# Patient Record
Sex: Female | Born: 1940 | Race: White | Hispanic: No | Smoking: Former smoker
Health system: Southern US, Community
[De-identification: ages and names within clinical notes are randomized; demographics above are authoritative.]

## PROBLEM LIST (undated history)

## (undated) DIAGNOSIS — M1990 Unspecified osteoarthritis, unspecified site: Secondary | ICD-10-CM

## (undated) DIAGNOSIS — K5792 Diverticulitis of intestine, part unspecified, without perforation or abscess without bleeding: Secondary | ICD-10-CM

## (undated) DIAGNOSIS — R32 Unspecified urinary incontinence: Secondary | ICD-10-CM

## (undated) DIAGNOSIS — E785 Hyperlipidemia, unspecified: Secondary | ICD-10-CM

## (undated) DIAGNOSIS — D472 Monoclonal gammopathy: Secondary | ICD-10-CM

## (undated) DIAGNOSIS — M8580 Other specified disorders of bone density and structure, unspecified site: Secondary | ICD-10-CM

## (undated) DIAGNOSIS — J45909 Unspecified asthma, uncomplicated: Secondary | ICD-10-CM

## (undated) DIAGNOSIS — I340 Nonrheumatic mitral (valve) insufficiency: Secondary | ICD-10-CM

## (undated) DIAGNOSIS — J449 Chronic obstructive pulmonary disease, unspecified: Secondary | ICD-10-CM

## (undated) DIAGNOSIS — I071 Rheumatic tricuspid insufficiency: Secondary | ICD-10-CM

## (undated) DIAGNOSIS — M109 Gout, unspecified: Secondary | ICD-10-CM

## (undated) DIAGNOSIS — J8410 Pulmonary fibrosis, unspecified: Secondary | ICD-10-CM

## (undated) DIAGNOSIS — I4891 Unspecified atrial fibrillation: Secondary | ICD-10-CM

## (undated) DIAGNOSIS — E039 Hypothyroidism, unspecified: Secondary | ICD-10-CM

## (undated) DIAGNOSIS — R3129 Other microscopic hematuria: Secondary | ICD-10-CM

## (undated) DIAGNOSIS — I10 Essential (primary) hypertension: Secondary | ICD-10-CM

## (undated) HISTORY — PX: ANKLE SURGERY: SHX546

## (undated) HISTORY — PX: APPENDECTOMY: SHX54

## (undated) HISTORY — DX: Monoclonal gammopathy: D47.2

## (undated) HISTORY — PX: ABDOMINAL HYSTERECTOMY: SHX81

## (undated) HISTORY — DX: Unspecified atrial fibrillation: I48.91

## (undated) HISTORY — PX: CHOLECYSTECTOMY: SHX55

## (undated) HISTORY — PX: OTHER SURGICAL HISTORY: SHX169

## (undated) HISTORY — PX: JOINT REPLACEMENT: SHX530

## (undated) HISTORY — PX: FRACTURE SURGERY: SHX138

---

## 2004-06-24 ENCOUNTER — Ambulatory Visit: Payer: Self-pay | Admitting: Family Medicine

## 2004-07-02 ENCOUNTER — Ambulatory Visit: Payer: Self-pay

## 2005-07-01 ENCOUNTER — Ambulatory Visit: Payer: Self-pay | Admitting: Unknown Physician Specialty

## 2005-07-23 ENCOUNTER — Ambulatory Visit: Payer: Self-pay | Admitting: Internal Medicine

## 2006-02-11 ENCOUNTER — Ambulatory Visit: Payer: Self-pay | Admitting: Gastroenterology

## 2006-07-13 ENCOUNTER — Ambulatory Visit: Payer: Self-pay | Admitting: Unknown Physician Specialty

## 2007-01-11 ENCOUNTER — Ambulatory Visit: Payer: Self-pay | Admitting: Unknown Physician Specialty

## 2007-03-29 ENCOUNTER — Ambulatory Visit: Payer: Self-pay | Admitting: Unknown Physician Specialty

## 2007-08-09 ENCOUNTER — Ambulatory Visit: Payer: Self-pay | Admitting: Unknown Physician Specialty

## 2008-01-20 ENCOUNTER — Ambulatory Visit: Payer: Self-pay | Admitting: Unknown Physician Specialty

## 2008-02-21 ENCOUNTER — Ambulatory Visit: Payer: Self-pay | Admitting: Internal Medicine

## 2008-03-15 ENCOUNTER — Ambulatory Visit: Payer: Self-pay | Admitting: Internal Medicine

## 2008-03-23 ENCOUNTER — Ambulatory Visit: Payer: Self-pay | Admitting: Internal Medicine

## 2008-04-02 ENCOUNTER — Emergency Department: Payer: Self-pay | Admitting: Emergency Medicine

## 2008-04-15 ENCOUNTER — Ambulatory Visit: Payer: Self-pay | Admitting: Internal Medicine

## 2008-09-11 ENCOUNTER — Ambulatory Visit: Payer: Self-pay | Admitting: Internal Medicine

## 2008-09-13 ENCOUNTER — Ambulatory Visit: Payer: Self-pay | Admitting: Internal Medicine

## 2008-10-01 ENCOUNTER — Ambulatory Visit: Payer: Self-pay | Admitting: Internal Medicine

## 2008-10-13 ENCOUNTER — Ambulatory Visit: Payer: Self-pay | Admitting: Internal Medicine

## 2009-05-28 ENCOUNTER — Ambulatory Visit: Payer: Self-pay | Admitting: Gastroenterology

## 2009-07-16 ENCOUNTER — Ambulatory Visit: Payer: Self-pay | Admitting: Gastroenterology

## 2009-09-13 ENCOUNTER — Ambulatory Visit: Payer: Self-pay | Admitting: Internal Medicine

## 2009-10-01 ENCOUNTER — Ambulatory Visit: Payer: Self-pay | Admitting: Internal Medicine

## 2009-10-03 ENCOUNTER — Ambulatory Visit: Payer: Self-pay | Admitting: Internal Medicine

## 2009-10-13 ENCOUNTER — Ambulatory Visit: Payer: Self-pay | Admitting: Internal Medicine

## 2009-10-30 ENCOUNTER — Ambulatory Visit: Payer: Self-pay | Admitting: Internal Medicine

## 2009-11-13 ENCOUNTER — Ambulatory Visit: Payer: Self-pay | Admitting: Internal Medicine

## 2009-11-13 ENCOUNTER — Ambulatory Visit: Payer: Self-pay | Admitting: Gastroenterology

## 2010-01-24 ENCOUNTER — Ambulatory Visit: Payer: Self-pay | Admitting: Internal Medicine

## 2010-02-13 ENCOUNTER — Ambulatory Visit: Payer: Self-pay | Admitting: Internal Medicine

## 2010-04-25 ENCOUNTER — Ambulatory Visit: Payer: Self-pay | Admitting: Internal Medicine

## 2010-05-15 ENCOUNTER — Ambulatory Visit: Payer: Self-pay | Admitting: Internal Medicine

## 2010-07-28 ENCOUNTER — Ambulatory Visit: Payer: Self-pay | Admitting: Internal Medicine

## 2010-08-01 ENCOUNTER — Ambulatory Visit: Payer: Self-pay | Admitting: Internal Medicine

## 2010-08-14 ENCOUNTER — Ambulatory Visit: Payer: Self-pay | Admitting: Internal Medicine

## 2010-12-31 ENCOUNTER — Ambulatory Visit: Payer: Self-pay

## 2011-03-06 ENCOUNTER — Ambulatory Visit: Payer: Self-pay | Admitting: Gastroenterology

## 2011-05-06 ENCOUNTER — Ambulatory Visit: Payer: Self-pay | Admitting: Internal Medicine

## 2011-08-05 ENCOUNTER — Ambulatory Visit: Payer: Self-pay | Admitting: Internal Medicine

## 2011-08-05 LAB — CBC CANCER CENTER
Basophil %: 0.7 %
Eosinophil #: 0.1 x10 3/mm (ref 0.0–0.7)
Eosinophil %: 2 %
Lymphocyte #: 0.9 x10 3/mm — ABNORMAL LOW (ref 1.0–3.6)
Lymphocyte %: 32.1 %
MCH: 34 pg (ref 26.0–34.0)
MCHC: 35.6 g/dL (ref 32.0–36.0)
MCV: 96 fL (ref 80–100)
Monocyte %: 11 %
Neutrophil #: 1.6 x10 3/mm (ref 1.4–6.5)
Platelet: 119 x10 3/mm — ABNORMAL LOW (ref 150–440)
RBC: 4.32 10*6/uL (ref 3.80–5.20)
RDW: 12.7 % (ref 11.5–14.5)

## 2011-08-05 LAB — CREATININE, SERUM
EGFR (African American): >60
EGFR (Non-African Amer.): >60

## 2011-08-05 LAB — HEPATIC FUNCTION PANEL A (ARMC)
Albumin: 3.6 g/dL (ref 3.4–5.0)
Alkaline Phosphatase: 141 U/L — ABNORMAL HIGH (ref 50–136)
Bilirubin, Direct: 0.2 mg/dL (ref 0.00–0.20)
SGPT (ALT): 90 U/L — ABNORMAL HIGH
Total Protein: 8.3 g/dL — ABNORMAL HIGH (ref 6.4–8.2)

## 2011-08-05 LAB — CALCIUM: Calcium, Total: 9.4 mg/dL (ref 8.5–10.1)

## 2011-08-14 ENCOUNTER — Ambulatory Visit: Payer: Self-pay | Admitting: Internal Medicine

## 2011-09-22 ENCOUNTER — Ambulatory Visit: Payer: Self-pay | Admitting: Gastroenterology

## 2012-01-27 ENCOUNTER — Ambulatory Visit: Payer: Self-pay

## 2012-02-02 ENCOUNTER — Ambulatory Visit: Payer: Self-pay | Admitting: Internal Medicine

## 2012-02-02 LAB — CBC CANCER CENTER
Basophil #: 0 x10 3/mm (ref 0.0–0.1)
Eosinophil #: 0.1 x10 3/mm (ref 0.0–0.7)
Eosinophil %: 3.3 %
HGB: 13 g/dL (ref 12.0–16.0)
Lymphocyte #: 1.1 x10 3/mm (ref 1.0–3.6)
MCH: 32.9 pg (ref 26.0–34.0)
MCHC: 33.8 g/dL (ref 32.0–36.0)
Monocyte #: 0.4 x10 3/mm (ref 0.2–0.9)
Neutrophil %: 53 %
Platelet: 132 x10 3/mm — ABNORMAL LOW (ref 150–440)
RBC: 3.96 10*6/uL (ref 3.80–5.20)
RDW: 12.8 % (ref 11.5–14.5)

## 2012-02-02 LAB — FERRITIN: Ferritin (ARMC): 185 ng/mL (ref 8–388)

## 2012-02-02 LAB — CREATININE, SERUM
Creatinine: 1.38 mg/dL — ABNORMAL HIGH (ref 0.60–1.30)
EGFR (African American): 44 — ABNORMAL LOW
EGFR (Non-African Amer.): 38 — ABNORMAL LOW

## 2012-02-02 LAB — IRON AND TIBC
Iron Bind.Cap.(Total): 345 ug/dL (ref 250–450)
Iron Saturation: 29 %

## 2012-02-03 LAB — PROT IMMUNOELECTROPHORES(ARMC)

## 2012-02-14 ENCOUNTER — Ambulatory Visit: Payer: Self-pay | Admitting: Internal Medicine

## 2012-03-29 ENCOUNTER — Ambulatory Visit: Payer: Self-pay

## 2012-04-11 ENCOUNTER — Ambulatory Visit: Payer: Self-pay | Admitting: Internal Medicine

## 2012-04-11 LAB — CBC CANCER CENTER
Basophil %: 0.9 %
Eosinophil %: 2.5 %
HCT: 40.4 % (ref 35.0–47.0)
HGB: 13.7 g/dL (ref 12.0–16.0)
Lymphocyte #: 1.4 x10 3/mm (ref 1.0–3.6)
Lymphocyte %: 38.3 %
Monocyte #: 0.4 x10 3/mm (ref 0.2–0.9)
Monocyte %: 11.5 %
Neutrophil #: 1.7 x10 3/mm (ref 1.4–6.5)
Neutrophil %: 46.8 %
RBC: 4.16 10*6/uL (ref 3.80–5.20)

## 2012-04-15 ENCOUNTER — Ambulatory Visit: Payer: Self-pay | Admitting: Internal Medicine

## 2012-04-19 ENCOUNTER — Ambulatory Visit: Payer: Self-pay | Admitting: Ophthalmology

## 2012-05-24 ENCOUNTER — Ambulatory Visit: Payer: Self-pay | Admitting: Ophthalmology

## 2012-07-16 ENCOUNTER — Ambulatory Visit: Payer: Self-pay | Admitting: Internal Medicine

## 2012-08-05 LAB — CREATININE, SERUM
EGFR (African American): >60
EGFR (Non-African Amer.): >60

## 2012-08-05 LAB — CBC CANCER CENTER
Basophil #: 0 x10 3/mm (ref 0.0–0.1)
Basophil %: 1 %
Eosinophil %: 3.5 %
HGB: 14 g/dL (ref 12.0–16.0)
Lymphocyte %: 36 %
Neutrophil #: 1.5 x10 3/mm (ref 1.4–6.5)
Neutrophil %: 47.9 %
RDW: 12.4 % (ref 11.5–14.5)
WBC: 3.2 x10 3/mm — ABNORMAL LOW (ref 3.6–11.0)

## 2012-08-05 LAB — CALCIUM: Calcium, Total: 9.1 mg/dL (ref 8.5–10.1)

## 2012-08-05 LAB — RETICULOCYTES: Reticulocyte: 1.16 % (ref 0.5–2.2)

## 2012-08-13 ENCOUNTER — Ambulatory Visit: Payer: Self-pay | Admitting: Internal Medicine

## 2013-01-27 ENCOUNTER — Ambulatory Visit: Payer: Self-pay | Admitting: Internal Medicine

## 2013-02-02 LAB — IRON AND TIBC
Iron Saturation: 34 %
Iron: 114 ug/dL (ref 50–170)
Unbound Iron-Bind.Cap.: 220 ug/dL

## 2013-02-02 LAB — BASIC METABOLIC PANEL
Anion Gap: 4 — ABNORMAL LOW (ref 7–16)
BUN: 15 mg/dL (ref 7–18)
Chloride: 102 mmol/L (ref 98–107)
Co2: 29 mmol/L (ref 21–32)
Creatinine: 0.8 mg/dL (ref 0.60–1.30)
EGFR (African American): >60
EGFR (Non-African Amer.): >60
Glucose: 111 mg/dL — ABNORMAL HIGH (ref 65–99)
Potassium: 3.8 mmol/L (ref 3.5–5.1)
Sodium: 135 mmol/L — ABNORMAL LOW (ref 136–145)

## 2013-02-02 LAB — FERRITIN: Ferritin (ARMC): 109 ng/mL (ref 8–388)

## 2013-02-07 ENCOUNTER — Ambulatory Visit: Payer: Self-pay

## 2013-02-13 ENCOUNTER — Ambulatory Visit: Payer: Self-pay | Admitting: Internal Medicine

## 2013-02-16 LAB — CBC CANCER CENTER
Basophil #: 0 x10 3/mm (ref 0.0–0.1)
Basophil %: 0.8 %
Eosinophil %: 3 %
HGB: 13.7 g/dL (ref 12.0–16.0)
Lymphocyte %: 33 %
MCH: 32.9 pg (ref 26.0–34.0)
MCHC: 35 g/dL (ref 32.0–36.0)
MCV: 94 fL (ref 80–100)
Monocyte #: 0.4 x10 3/mm (ref 0.2–0.9)
Monocyte %: 11.9 %
Neutrophil #: 1.6 x10 3/mm (ref 1.4–6.5)
Neutrophil %: 51.3 %
Platelet: 119 x10 3/mm — ABNORMAL LOW (ref 150–440)
RDW: 12.5 % (ref 11.5–14.5)

## 2013-03-15 ENCOUNTER — Ambulatory Visit: Payer: Self-pay | Admitting: Internal Medicine

## 2013-03-30 ENCOUNTER — Ambulatory Visit: Payer: Self-pay

## 2013-08-03 ENCOUNTER — Ambulatory Visit: Payer: Self-pay | Admitting: Internal Medicine

## 2013-08-17 ENCOUNTER — Ambulatory Visit: Payer: Self-pay | Admitting: Internal Medicine

## 2013-08-18 LAB — CBC CANCER CENTER
BASOS PCT: 0.9 %
Basophil #: 0 x10 3/mm (ref 0.0–0.1)
EOS ABS: 0.1 x10 3/mm (ref 0.0–0.7)
EOS PCT: 2.6 %
HCT: 39.9 % (ref 35.0–47.0)
HGB: 13.6 g/dL (ref 12.0–16.0)
LYMPHS ABS: 1 x10 3/mm (ref 1.0–3.6)
Lymphocyte %: 31.9 %
MCH: 31.9 pg (ref 26.0–34.0)
MCHC: 34.2 g/dL (ref 32.0–36.0)
MCV: 93 fL (ref 80–100)
MONO ABS: 0.3 x10 3/mm (ref 0.2–0.9)
MONOS PCT: 10.4 %
Neutrophil #: 1.7 x10 3/mm (ref 1.4–6.5)
Neutrophil %: 54.2 %
Platelet: 111 x10 3/mm — ABNORMAL LOW (ref 150–440)
RBC: 4.28 10*6/uL (ref 3.80–5.20)
RDW: 12.6 % (ref 11.5–14.5)
WBC: 3.1 x10 3/mm — AB (ref 3.6–11.0)

## 2013-08-18 LAB — CALCIUM: CALCIUM: 9.5 mg/dL (ref 8.5–10.1)

## 2013-08-18 LAB — CREATININE, SERUM
CREATININE: 0.87 mg/dL (ref 0.60–1.30)
EGFR (African American): >60
EGFR (Non-African Amer.): >60

## 2013-08-18 LAB — RETICULOCYTES
ABSOLUTE RETIC COUNT: 0.0659 10*6/uL (ref 0.019–0.186)
RETICULOCYTE: 1.54 % (ref 0.4–3.1)

## 2013-08-21 LAB — PROT IMMUNOELECTROPHORES(ARMC)

## 2013-08-25 ENCOUNTER — Ambulatory Visit: Payer: Self-pay | Admitting: Gastroenterology

## 2013-09-13 ENCOUNTER — Ambulatory Visit: Payer: Self-pay | Admitting: Internal Medicine

## 2014-02-13 DIAGNOSIS — D472 Monoclonal gammopathy: Secondary | ICD-10-CM | POA: Insufficient documentation

## 2014-02-13 DIAGNOSIS — Z8739 Personal history of other diseases of the musculoskeletal system and connective tissue: Secondary | ICD-10-CM | POA: Insufficient documentation

## 2014-02-13 DIAGNOSIS — R945 Abnormal results of liver function studies: Secondary | ICD-10-CM | POA: Insufficient documentation

## 2014-02-13 DIAGNOSIS — M19042 Primary osteoarthritis, left hand: Secondary | ICD-10-CM

## 2014-02-13 DIAGNOSIS — R768 Other specified abnormal immunological findings in serum: Secondary | ICD-10-CM | POA: Insufficient documentation

## 2014-02-13 DIAGNOSIS — R7989 Other specified abnormal findings of blood chemistry: Secondary | ICD-10-CM | POA: Insufficient documentation

## 2014-02-13 DIAGNOSIS — M19041 Primary osteoarthritis, right hand: Secondary | ICD-10-CM | POA: Insufficient documentation

## 2014-02-20 ENCOUNTER — Ambulatory Visit: Payer: Self-pay | Admitting: Internal Medicine

## 2014-02-20 LAB — CREATININE, SERUM
Creatinine: 0.99 mg/dL (ref 0.60–1.30)
EGFR (African American): >60
EGFR (Non-African Amer.): 56 — ABNORMAL LOW

## 2014-02-20 LAB — CBC CANCER CENTER
Basophil #: 0 x10 3/mm (ref 0.0–0.1)
Basophil %: 1 %
Eosinophil #: 0.1 x10 3/mm (ref 0.0–0.7)
Eosinophil %: 3.2 %
HCT: 38.9 % (ref 35.0–47.0)
HGB: 13.4 g/dL (ref 12.0–16.0)
LYMPHS ABS: 1.1 x10 3/mm (ref 1.0–3.6)
LYMPHS PCT: 32.6 %
MCH: 32.3 pg (ref 26.0–34.0)
MCHC: 34.5 g/dL (ref 32.0–36.0)
MCV: 94 fL (ref 80–100)
Monocyte #: 0.4 x10 3/mm (ref 0.2–0.9)
Monocyte %: 12.1 %
NEUTROS PCT: 51.1 %
Neutrophil #: 1.8 x10 3/mm (ref 1.4–6.5)
PLATELETS: 120 x10 3/mm — AB (ref 150–440)
RBC: 4.14 10*6/uL (ref 3.80–5.20)
RDW: 12.5 % (ref 11.5–14.5)
WBC: 3.5 x10 3/mm — AB (ref 3.6–11.0)

## 2014-02-20 LAB — CALCIUM: Calcium, Total: 8.9 mg/dL (ref 8.5–10.1)

## 2014-02-20 LAB — FERRITIN: FERRITIN (ARMC): 96 ng/mL (ref 8–388)

## 2014-03-15 ENCOUNTER — Ambulatory Visit: Payer: Self-pay | Admitting: Internal Medicine

## 2014-08-20 ENCOUNTER — Ambulatory Visit
Admit: 2014-08-20 | Disposition: A | Discharge status: Home / Self Care | Payer: Self-pay | Attending: Internal Medicine | Admitting: Internal Medicine

## 2014-08-20 DIAGNOSIS — Z79899 Other long term (current) drug therapy: Secondary | ICD-10-CM | POA: Diagnosis not present

## 2014-08-20 DIAGNOSIS — D72819 Decreased white blood cell count, unspecified: Secondary | ICD-10-CM | POA: Diagnosis not present

## 2014-08-20 DIAGNOSIS — E039 Hypothyroidism, unspecified: Secondary | ICD-10-CM | POA: Diagnosis not present

## 2014-08-20 DIAGNOSIS — I10 Essential (primary) hypertension: Secondary | ICD-10-CM | POA: Diagnosis not present

## 2014-08-20 DIAGNOSIS — Z7982 Long term (current) use of aspirin: Secondary | ICD-10-CM | POA: Diagnosis not present

## 2014-08-20 DIAGNOSIS — D472 Monoclonal gammopathy: Secondary | ICD-10-CM | POA: Diagnosis not present

## 2014-08-20 DIAGNOSIS — R161 Splenomegaly, not elsewhere classified: Secondary | ICD-10-CM | POA: Diagnosis not present

## 2014-08-20 DIAGNOSIS — D696 Thrombocytopenia, unspecified: Secondary | ICD-10-CM | POA: Diagnosis not present

## 2014-08-20 DIAGNOSIS — K529 Noninfective gastroenteritis and colitis, unspecified: Secondary | ICD-10-CM | POA: Diagnosis not present

## 2014-09-04 DIAGNOSIS — E039 Hypothyroidism, unspecified: Secondary | ICD-10-CM | POA: Diagnosis not present

## 2014-09-04 DIAGNOSIS — H8149 Vertigo of central origin, unspecified ear: Secondary | ICD-10-CM | POA: Diagnosis not present

## 2014-09-04 DIAGNOSIS — D72819 Decreased white blood cell count, unspecified: Secondary | ICD-10-CM | POA: Diagnosis not present

## 2014-09-04 DIAGNOSIS — I10 Essential (primary) hypertension: Secondary | ICD-10-CM | POA: Diagnosis not present

## 2014-09-04 DIAGNOSIS — M79672 Pain in left foot: Secondary | ICD-10-CM | POA: Diagnosis not present

## 2014-09-04 DIAGNOSIS — M064 Inflammatory polyarthropathy: Secondary | ICD-10-CM | POA: Diagnosis not present

## 2014-09-04 DIAGNOSIS — K7460 Unspecified cirrhosis of liver: Secondary | ICD-10-CM | POA: Diagnosis not present

## 2014-09-14 ENCOUNTER — Ambulatory Visit
Admit: 2014-09-14 | Disposition: A | Discharge status: Home / Self Care | Payer: Self-pay | Attending: Internal Medicine | Admitting: Internal Medicine

## 2014-09-19 ENCOUNTER — Ambulatory Visit
Admit: 2014-09-19 | Disposition: A | Discharge status: Home / Self Care | Payer: Self-pay | Attending: Nurse Practitioner | Admitting: Nurse Practitioner

## 2014-09-19 DIAGNOSIS — M7989 Other specified soft tissue disorders: Secondary | ICD-10-CM | POA: Diagnosis not present

## 2014-09-19 DIAGNOSIS — M79672 Pain in left foot: Secondary | ICD-10-CM | POA: Diagnosis not present

## 2014-09-26 DIAGNOSIS — M3501 Sicca syndrome with keratoconjunctivitis: Secondary | ICD-10-CM | POA: Diagnosis not present

## 2014-11-01 ENCOUNTER — Other Ambulatory Visit: Payer: Self-pay | Admitting: Gastroenterology

## 2014-11-01 DIAGNOSIS — Z86010 Personal history of colonic polyps: Secondary | ICD-10-CM | POA: Diagnosis not present

## 2014-11-01 DIAGNOSIS — K760 Fatty (change of) liver, not elsewhere classified: Secondary | ICD-10-CM

## 2014-11-06 ENCOUNTER — Ambulatory Visit: Payer: Self-pay

## 2014-11-09 ENCOUNTER — Ambulatory Visit
Admission: RE | Admit: 2014-11-09 | Discharge: 2014-11-09 | Disposition: A | Discharge status: Home / Self Care | Payer: Commercial Managed Care - HMO | Source: Ambulatory Visit | Attending: Gastroenterology | Admitting: Gastroenterology

## 2014-11-09 DIAGNOSIS — K760 Fatty (change of) liver, not elsewhere classified: Secondary | ICD-10-CM | POA: Diagnosis not present

## 2014-11-09 DIAGNOSIS — Z9049 Acquired absence of other specified parts of digestive tract: Secondary | ICD-10-CM | POA: Diagnosis not present

## 2014-12-03 ENCOUNTER — Encounter: Payer: Self-pay | Admitting: *Deleted

## 2014-12-04 ENCOUNTER — Encounter: Payer: Self-pay | Admitting: *Deleted

## 2014-12-04 ENCOUNTER — Encounter
Admission: RE | Disposition: A | Discharge status: Home / Self Care | Payer: Self-pay | Source: Ambulatory Visit | Attending: Gastroenterology

## 2014-12-04 ENCOUNTER — Ambulatory Visit: Payer: Commercial Managed Care - HMO | Admitting: Anesthesiology

## 2014-12-04 ENCOUNTER — Ambulatory Visit
Admission: RE | Admit: 2014-12-04 | Discharge: 2014-12-04 | Disposition: A | Discharge status: Home / Self Care | Payer: Commercial Managed Care - HMO | Source: Ambulatory Visit | Attending: Gastroenterology | Admitting: Gastroenterology

## 2014-12-04 DIAGNOSIS — D123 Benign neoplasm of transverse colon: Secondary | ICD-10-CM | POA: Insufficient documentation

## 2014-12-04 DIAGNOSIS — Z885 Allergy status to narcotic agent status: Secondary | ICD-10-CM | POA: Diagnosis not present

## 2014-12-04 DIAGNOSIS — M8580 Other specified disorders of bone density and structure, unspecified site: Secondary | ICD-10-CM | POA: Insufficient documentation

## 2014-12-04 DIAGNOSIS — D175 Benign lipomatous neoplasm of intra-abdominal organs: Secondary | ICD-10-CM | POA: Diagnosis not present

## 2014-12-04 DIAGNOSIS — J449 Chronic obstructive pulmonary disease, unspecified: Secondary | ICD-10-CM | POA: Diagnosis not present

## 2014-12-04 DIAGNOSIS — K5730 Diverticulosis of large intestine without perforation or abscess without bleeding: Secondary | ICD-10-CM | POA: Insufficient documentation

## 2014-12-04 DIAGNOSIS — I340 Nonrheumatic mitral (valve) insufficiency: Secondary | ICD-10-CM | POA: Diagnosis not present

## 2014-12-04 DIAGNOSIS — R319 Hematuria, unspecified: Secondary | ICD-10-CM | POA: Insufficient documentation

## 2014-12-04 DIAGNOSIS — I358 Other nonrheumatic aortic valve disorders: Secondary | ICD-10-CM | POA: Insufficient documentation

## 2014-12-04 DIAGNOSIS — R32 Unspecified urinary incontinence: Secondary | ICD-10-CM | POA: Diagnosis not present

## 2014-12-04 DIAGNOSIS — E039 Hypothyroidism, unspecified: Secondary | ICD-10-CM | POA: Insufficient documentation

## 2014-12-04 DIAGNOSIS — Z86010 Personal history of colonic polyps: Secondary | ICD-10-CM | POA: Insufficient documentation

## 2014-12-04 DIAGNOSIS — Z79899 Other long term (current) drug therapy: Secondary | ICD-10-CM | POA: Insufficient documentation

## 2014-12-04 DIAGNOSIS — D124 Benign neoplasm of descending colon: Secondary | ICD-10-CM | POA: Insufficient documentation

## 2014-12-04 DIAGNOSIS — E785 Hyperlipidemia, unspecified: Secondary | ICD-10-CM | POA: Diagnosis not present

## 2014-12-04 DIAGNOSIS — J8410 Pulmonary fibrosis, unspecified: Secondary | ICD-10-CM | POA: Diagnosis not present

## 2014-12-04 DIAGNOSIS — K219 Gastro-esophageal reflux disease without esophagitis: Secondary | ICD-10-CM | POA: Diagnosis not present

## 2014-12-04 DIAGNOSIS — I10 Essential (primary) hypertension: Secondary | ICD-10-CM | POA: Diagnosis not present

## 2014-12-04 DIAGNOSIS — M1990 Unspecified osteoarthritis, unspecified site: Secondary | ICD-10-CM | POA: Diagnosis not present

## 2014-12-04 DIAGNOSIS — Z888 Allergy status to other drugs, medicaments and biological substances status: Secondary | ICD-10-CM | POA: Diagnosis not present

## 2014-12-04 DIAGNOSIS — M109 Gout, unspecified: Secondary | ICD-10-CM | POA: Insufficient documentation

## 2014-12-04 DIAGNOSIS — D179 Benign lipomatous neoplasm, unspecified: Secondary | ICD-10-CM | POA: Diagnosis not present

## 2014-12-04 DIAGNOSIS — R7989 Other specified abnormal findings of blood chemistry: Secondary | ICD-10-CM | POA: Diagnosis not present

## 2014-12-04 DIAGNOSIS — Z7982 Long term (current) use of aspirin: Secondary | ICD-10-CM | POA: Diagnosis not present

## 2014-12-04 HISTORY — DX: Hypothyroidism, unspecified: E03.9

## 2014-12-04 HISTORY — DX: Hyperlipidemia, unspecified: E78.5

## 2014-12-04 HISTORY — DX: Gout, unspecified: M10.9

## 2014-12-04 HISTORY — DX: Chronic obstructive pulmonary disease, unspecified: J44.9

## 2014-12-04 HISTORY — DX: Rheumatic tricuspid insufficiency: I07.1

## 2014-12-04 HISTORY — DX: Unspecified osteoarthritis, unspecified site: M19.90

## 2014-12-04 HISTORY — DX: Essential (primary) hypertension: I10

## 2014-12-04 HISTORY — DX: Other microscopic hematuria: R31.29

## 2014-12-04 HISTORY — DX: Diverticulitis of intestine, part unspecified, without perforation or abscess without bleeding: K57.92

## 2014-12-04 HISTORY — DX: Nonrheumatic mitral (valve) insufficiency: I34.0

## 2014-12-04 HISTORY — DX: Other specified disorders of bone density and structure, unspecified site: M85.80

## 2014-12-04 HISTORY — DX: Pulmonary fibrosis, unspecified: J84.10

## 2014-12-04 HISTORY — PX: COLONOSCOPY WITH PROPOFOL: SHX5780

## 2014-12-04 HISTORY — DX: Unspecified urinary incontinence: R32

## 2014-12-04 LAB — PLATELET COUNT: Platelets: 128 10*3/uL — ABNORMAL LOW (ref 150–440)

## 2014-12-04 LAB — PROTIME-INR
INR: 1.06
Prothrombin Time: 14 seconds (ref 11.4–15.0)

## 2014-12-04 SURGERY — COLONOSCOPY WITH PROPOFOL
Anesthesia: General

## 2014-12-04 MED ORDER — MIDAZOLAM HCL 2 MG/2ML IJ SOLN
INTRAMUSCULAR | Status: DC | PRN
  Administered 2014-12-04: 1 mg via INTRAVENOUS

## 2014-12-04 MED ORDER — FENTANYL CITRATE (PF) 100 MCG/2ML IJ SOLN
INTRAMUSCULAR | Status: DC | PRN
  Administered 2014-12-04: 50 ug via INTRAVENOUS

## 2014-12-04 MED ORDER — SODIUM CHLORIDE 0.9 % IV SOLN
INTRAVENOUS | Status: DC
  Administered 2014-12-04: 08:00:00 via INTRAVENOUS

## 2014-12-04 MED ORDER — SODIUM CHLORIDE 0.9 % IV SOLN: INTRAVENOUS | Status: DC

## 2014-12-04 NOTE — Anesthesia Preprocedure Evaluation (Signed)
Anesthesia Evaluation  Patient identified by MRN, date of birth, ID band Patient awake    Reviewed: Allergy & Precautions, NPO status , Patient's Chart, lab work & pertinent test results  History of Anesthesia Complications Negative for: history of anesthetic complications  Airway Mallampati: II  TM Distance: >3 FB Neck ROM: Full    Dental  (+) Upper Dentures, Lower Dentures   Pulmonary COPD breath sounds clear to auscultation  Pulmonary exam normal       Cardiovascular Exercise Tolerance: Poor hypertension, Pt. on medications Normal cardiovascular examRhythm:Regular Rate:Normal     Neuro/Psych negative neurological ROS  negative psych ROS   GI/Hepatic Neg liver ROS, GERD-  Medicated and Controlled,Increased LFTs   Endo/Other  Hypothyroidism   Renal/GU negative Renal ROS  negative genitourinary   Musculoskeletal  (+) Arthritis -,   Abdominal   Peds negative pediatric ROS (+)  Hematology negative hematology ROS (+)   Anesthesia Other Findings   Reproductive/Obstetrics negative OB ROS                             Anesthesia Physical Anesthesia Plan  ASA: III  Anesthesia Plan: General   Post-op Pain Management:    Induction: Intravenous  Airway Management Planned: Nasal Cannula  Additional Equipment:   Intra-op Plan:   Post-operative Plan:   Informed Consent: I have reviewed the patients History and Physical, chart, labs and discussed the procedure including the risks, benefits and alternatives for the proposed anesthesia with the patient or authorized representative who has indicated his/her understanding and acceptance.     Plan Discussed with: Surgeon and CRNA  Anesthesia Plan Comments:         Anesthesia Quick Evaluation

## 2014-12-04 NOTE — Op Note (Signed)
[facility] Gastroenterology Patient Name: Martha Peterson Procedure Date: 12/04/2014 9:00 AM MRN: [id number] Account #: [id number] Date of Birth: 08-Aug-1940 Admit Type: Outpatient Age: 74 Room: [facility] ENDO ROOM 3 Gender: Female Note Status: Finalized Procedure:         Colonoscopy Indications:       Personal history of colonic polyps Providers:         Lollie Sails, MD Referring MD:      [name], MD (Referring MD) Medicines:         Monitored Anesthesia Care Complications:     No immediate complications. Procedure:         Pre-Anesthesia Assessment:                    - ASA Grade Assessment: II - A patient with mild systemic                     disease.                    After obtaining informed consent, the colonoscope was                     passed under direct vision. Throughout the procedure, the                     patient's blood pressure, pulse, and oxygen saturations                     were monitored continuously. The Colonoscope was                     introduced through the anus and advanced to the the cecum,                     identified by appendiceal orifice and ileocecal valve. The                     colonoscopy was performed with moderate difficulty.                     Successful completion of the procedure was aided by                     changing the patient to a supine position and using manual                     pressure. Findings:      There was a large lipoma, 34 mm in diameter, in the proximal sigmoid       colon. Positive pillow sign      A 2 mm polyp was found in the transverse colon. The polyp was flat. The       polyp was removed with a cold biopsy forceps. Resection and retrieval       were complete.      A 2 mm polyp was found in the descending colon. The polyp was sessile.       The polyp was removed with a cold biopsy forceps. Resection and       retrieval were complete.      A few small-mouthed diverticula were  found in the sigmoid colon and in       the descending colon.      The digital rectal exam was normal. Impression:        -  Large lipoma in the proximal sigmoid colon.                    - One 2 mm polyp in the transverse colon. Resected and                     retrieved.                    - One 2 mm polyp in the descending colon. Resected and                     retrieved.                    - Diverticulosis in the sigmoid colon and in the                     descending colon. Recommendation:    - Await pathology results.                    - Telephone GI clinic for pathology results in 1 week.                    - Discharge patient to home. Procedure Code(s): --- Professional ---                    [phone], Colonoscopy, flexible; with biopsy, single or                     multiple Diagnosis Code(s): --- Professional ---                    214.3, Lipoma of intra-abdominal organs                    211.3, Benign neoplasm of colon                    V12.72, Personal history of colonic polyps                    562.10, Diverticulosis of colon (without mention of                     hemorrhage) CPT copyright 2014 American Medical Association. All rights reserved. The codes documented in this report are preliminary and upon coder review may  be revised to meet current compliance requirements. Lollie Sails, MD 12/04/2014 9:41:03 AM This report has been signed electronically. Number of Addenda: 0 Note Initiated On: 12/04/2014 9:00 AM Scope Withdrawal Time: 0 hours 9 minutes 36 seconds  Total Procedure Duration: 0 hours 25 minutes 42 seconds       [facility]

## 2014-12-04 NOTE — H&P (Signed)
Outpatient short stay form Pre-procedure 12/04/2014 8:55 AM Lollie Sails MD  Primary Physician: Dr. Clayborn Bigness  Reason for visit:  Colonoscopy  History of present illness:  Patient is a 74 year old Caucasian female presenting for a colonoscopy in regards to a personal history of adenomatous colon polyps. Her last colonoscopy was in 2013. Also has a history of fatty liver/steatohepatosis.  She tolerated her prep well she is not taking any aspirin products currently.    Current facility-administered medications:  .  0.9 %  sodium chloride infusion, , Intravenous, Continuous, Lollie Sails, MD .  0.9 %  sodium chloride infusion, , Intravenous, Continuous, Lollie Sails, MD, Last Rate: 20 mL/hr at 12/04/14 0825  Prescriptions prior to admission  Medication Sig Dispense Refill Last Dose  . ascorbic acid (VITAMIN C) 500 MG tablet Take 500 mg by mouth daily.   Past Week at Unknown time  . aspirin EC 81 MG tablet Take 81 mg by mouth daily.   Past Week at Unknown time  . cholecalciferol (VITAMIN D) 400 UNITS TABS tablet Take 1,000 Units by mouth.   Past Week at Unknown time  . cloNIDine (CATAPRES) 0.1 MG tablet Take 0.1 mg by mouth daily.   12/04/2014 at 0600  . etodolac (LODINE) 300 MG capsule Take 300 mg by mouth 2 (two) times daily.   12/03/2014 at Unknown time  . furosemide (LASIX) 40 MG tablet Take 40 mg by mouth 2 (two) times daily.   12/03/2014 at Unknown time  . levothyroxine (SYNTHROID, LEVOTHROID) 150 MCG tablet Take 300 mcg by mouth daily before breakfast.   12/04/2014 at 0600  . losartan-hydrochlorothiazide (HYZAAR) 100-25 MG per tablet Take 1 tablet by mouth daily.   12/04/2014 at 0600  . meclizine (ANTIVERT) 12.5 MG tablet Take 12.5 mg by mouth daily.   12/03/2014 at Unknown time  . omega-3 acid ethyl esters (LOVAZA) 1 G capsule Take 1 g by mouth 2 (two) times daily.   Past Week at Unknown time  . omeprazole (PRILOSEC) 20 MG capsule Take 20 mg by mouth daily.   12/03/2014 at  Unknown time  . oxycodone (OXY-IR) 5 MG capsule Take 5 mg by mouth every 4 (four) hours as needed.   12/03/2014 at Unknown time  . Potassium Gluconate 595 MG CAPS Take by mouth.   12/03/2014 at Unknown time  . tiZANidine (ZANAFLEX) 4 MG capsule Take 4 mg by mouth at bedtime.   12/03/2014 at Unknown time  . traMADol (ULTRAM) 50 MG tablet Take by mouth every 8 (eight) hours as needed.   12/03/2014 at Unknown time     Allergies  Allergen Reactions  . Codeine   . Levaquin [Levofloxacin]      Past Medical History  Diagnosis Date  . Hypertension   . Hyperlipidemia   . Hypothyroidism   . COPD (chronic obstructive pulmonary disease)   . Pulmonary fibrosis   . Urinary incontinence   . Mild tricuspid regurgitation   . Mild mitral regurgitation   . Osteopenia   . Microhematuria   . Diverticulitis   . Arthritis   . Gout     Review of systems:      Physical Exam    Heart and lungs: Regular rate and rhythm without rub or gallop lungs are bilaterally clear. There is a aortic murmur mild stenosis. Lungs are bilaterally clear    HEENT: Normocephalic atraumatic eyes are anicteric    Other:     Pertinant exam for procedure: Soft nontender nondistended  bowel sounds positive normoactive    Planned proceedures: Colonoscopy and indicated procedures I have discussed the risks benefits and complications of procedures to include not limited to bleeding, infection, perforation and the risk of sedation and the patient wishes to proceed. I have discussed the risks benefits and complications of procedures to include not limited to bleeding, infection, perforation and the risk of sedation and the patient wishes to proceed.    Lollie Sails, MD Gastroenterology 12/04/2014  8:55 AM

## 2014-12-04 NOTE — Anesthesia Postprocedure Evaluation (Signed)
  Anesthesia Post-op Note  Patient: Martha Peterson  Procedure(s) Performed: Procedure(s): COLONOSCOPY WITH PROPOFOL (N/A)  Anesthesia type:General  Patient location: PACU  Post pain: Pain level controlled  Post assessment: Post-op Vital signs reviewed, Patient's Cardiovascular Status Stable, Respiratory Function Stable, Patent Airway and No signs of Nausea or vomiting  Post vital signs: Reviewed and stable  Last Vitals:  Filed Vitals:   12/04/14 1010  BP: 139/95  Pulse: 62  Temp:   Resp: 16    Level of consciousness: awake, alert  and patient cooperative  Complications: No apparent anesthesia complications

## 2014-12-04 NOTE — Transfer of Care (Signed)
Immediate Anesthesia Transfer of Care Note  Patient: Martha Peterson  Procedure(s) Performed: Procedure(s): COLONOSCOPY WITH PROPOFOL (N/A)  Patient Location: PACU  Anesthesia Type:General  Level of Consciousness: alert   Airway & Oxygen Therapy: Patient Spontanous Breathing  Post-op Assessment: Report given to RN  Post vital signs: stable  Last Vitals:  Filed Vitals:   12/04/14 0800  BP: 97/58  Pulse: 68  Temp: 36.2 C  Resp: 14    Complications: No apparent anesthesia complications

## 2014-12-05 ENCOUNTER — Encounter: Payer: Self-pay | Admitting: Gastroenterology

## 2014-12-05 LAB — SURGICAL PATHOLOGY

## 2015-01-02 DIAGNOSIS — M79672 Pain in left foot: Secondary | ICD-10-CM | POA: Diagnosis not present

## 2015-01-02 DIAGNOSIS — Z0001 Encounter for general adult medical examination with abnormal findings: Secondary | ICD-10-CM | POA: Diagnosis not present

## 2015-01-02 DIAGNOSIS — M064 Inflammatory polyarthropathy: Secondary | ICD-10-CM | POA: Diagnosis not present

## 2015-01-02 DIAGNOSIS — I10 Essential (primary) hypertension: Secondary | ICD-10-CM | POA: Diagnosis not present

## 2015-01-02 DIAGNOSIS — I6522 Occlusion and stenosis of left carotid artery: Secondary | ICD-10-CM | POA: Diagnosis not present

## 2015-01-02 DIAGNOSIS — R7301 Impaired fasting glucose: Secondary | ICD-10-CM | POA: Diagnosis not present

## 2015-01-02 DIAGNOSIS — R011 Cardiac murmur, unspecified: Secondary | ICD-10-CM | POA: Diagnosis not present

## 2015-01-17 DIAGNOSIS — R7301 Impaired fasting glucose: Secondary | ICD-10-CM | POA: Diagnosis not present

## 2015-01-17 DIAGNOSIS — Z0001 Encounter for general adult medical examination with abnormal findings: Secondary | ICD-10-CM | POA: Diagnosis not present

## 2015-01-17 DIAGNOSIS — E559 Vitamin D deficiency, unspecified: Secondary | ICD-10-CM | POA: Diagnosis not present

## 2015-01-17 DIAGNOSIS — E039 Hypothyroidism, unspecified: Secondary | ICD-10-CM | POA: Diagnosis not present

## 2015-01-17 DIAGNOSIS — I6523 Occlusion and stenosis of bilateral carotid arteries: Secondary | ICD-10-CM | POA: Diagnosis not present

## 2015-01-17 DIAGNOSIS — I10 Essential (primary) hypertension: Secondary | ICD-10-CM | POA: Diagnosis not present

## 2015-01-31 DIAGNOSIS — R011 Cardiac murmur, unspecified: Secondary | ICD-10-CM | POA: Diagnosis not present

## 2015-02-22 DIAGNOSIS — M545 Low back pain: Secondary | ICD-10-CM | POA: Diagnosis not present

## 2015-02-22 DIAGNOSIS — I10 Essential (primary) hypertension: Secondary | ICD-10-CM | POA: Diagnosis not present

## 2015-02-22 DIAGNOSIS — D72819 Decreased white blood cell count, unspecified: Secondary | ICD-10-CM | POA: Diagnosis not present

## 2015-02-22 DIAGNOSIS — I6523 Occlusion and stenosis of bilateral carotid arteries: Secondary | ICD-10-CM | POA: Diagnosis not present

## 2015-02-22 DIAGNOSIS — E039 Hypothyroidism, unspecified: Secondary | ICD-10-CM | POA: Diagnosis not present

## 2015-02-22 DIAGNOSIS — R7301 Impaired fasting glucose: Secondary | ICD-10-CM | POA: Diagnosis not present

## 2015-02-22 DIAGNOSIS — R011 Cardiac murmur, unspecified: Secondary | ICD-10-CM | POA: Diagnosis not present

## 2015-05-17 DIAGNOSIS — E782 Mixed hyperlipidemia: Secondary | ICD-10-CM | POA: Diagnosis not present

## 2015-05-21 DIAGNOSIS — K7460 Unspecified cirrhosis of liver: Secondary | ICD-10-CM | POA: Diagnosis not present

## 2015-05-21 DIAGNOSIS — I10 Essential (primary) hypertension: Secondary | ICD-10-CM | POA: Diagnosis not present

## 2015-05-21 DIAGNOSIS — E782 Mixed hyperlipidemia: Secondary | ICD-10-CM | POA: Diagnosis not present

## 2015-05-21 DIAGNOSIS — M064 Inflammatory polyarthropathy: Secondary | ICD-10-CM | POA: Diagnosis not present

## 2015-06-18 ENCOUNTER — Other Ambulatory Visit: Payer: Self-pay | Admitting: Physician Assistant

## 2015-06-18 ENCOUNTER — Ambulatory Visit
Admission: RE | Admit: 2015-06-18 | Discharge: 2015-06-18 | Disposition: A | Discharge status: Home / Self Care | Payer: Commercial Managed Care - HMO | Source: Ambulatory Visit | Attending: Physician Assistant | Admitting: Physician Assistant

## 2015-06-18 DIAGNOSIS — J4531 Mild persistent asthma with (acute) exacerbation: Secondary | ICD-10-CM | POA: Diagnosis not present

## 2015-06-18 DIAGNOSIS — R062 Wheezing: Secondary | ICD-10-CM | POA: Insufficient documentation

## 2015-06-18 DIAGNOSIS — R05 Cough: Secondary | ICD-10-CM | POA: Diagnosis not present

## 2015-06-18 DIAGNOSIS — R0602 Shortness of breath: Secondary | ICD-10-CM | POA: Diagnosis not present

## 2015-06-27 DIAGNOSIS — J45991 Cough variant asthma: Secondary | ICD-10-CM | POA: Diagnosis not present

## 2015-06-27 DIAGNOSIS — J4531 Mild persistent asthma with (acute) exacerbation: Secondary | ICD-10-CM | POA: Diagnosis not present

## 2015-06-27 DIAGNOSIS — J309 Allergic rhinitis, unspecified: Secondary | ICD-10-CM | POA: Diagnosis not present

## 2015-07-03 DIAGNOSIS — M85862 Other specified disorders of bone density and structure, left lower leg: Secondary | ICD-10-CM | POA: Diagnosis not present

## 2015-07-03 DIAGNOSIS — M85861 Other specified disorders of bone density and structure, right lower leg: Secondary | ICD-10-CM | POA: Diagnosis not present

## 2015-07-03 DIAGNOSIS — Z1231 Encounter for screening mammogram for malignant neoplasm of breast: Secondary | ICD-10-CM | POA: Diagnosis not present

## 2015-08-20 ENCOUNTER — Inpatient Hospital Stay: Payer: Commercial Managed Care - HMO

## 2015-08-20 ENCOUNTER — Other Ambulatory Visit: Payer: Self-pay | Admitting: Gastroenterology

## 2015-08-20 ENCOUNTER — Encounter: Payer: Self-pay | Admitting: Family Medicine

## 2015-08-20 ENCOUNTER — Inpatient Hospital Stay: Payer: Commercial Managed Care - HMO | Attending: Family Medicine | Admitting: Family Medicine

## 2015-08-20 VITALS — BP 137/73 | HR 60 | Temp 97.8°F | Wt 211.6 lb

## 2015-08-20 DIAGNOSIS — J8410 Pulmonary fibrosis, unspecified: Secondary | ICD-10-CM | POA: Insufficient documentation

## 2015-08-20 DIAGNOSIS — M129 Arthropathy, unspecified: Secondary | ICD-10-CM | POA: Insufficient documentation

## 2015-08-20 DIAGNOSIS — Z8719 Personal history of other diseases of the digestive system: Secondary | ICD-10-CM | POA: Diagnosis not present

## 2015-08-20 DIAGNOSIS — M818 Other osteoporosis without current pathological fracture: Secondary | ICD-10-CM | POA: Insufficient documentation

## 2015-08-20 DIAGNOSIS — I10 Essential (primary) hypertension: Secondary | ICD-10-CM | POA: Insufficient documentation

## 2015-08-20 DIAGNOSIS — Z7982 Long term (current) use of aspirin: Secondary | ICD-10-CM | POA: Insufficient documentation

## 2015-08-20 DIAGNOSIS — K760 Fatty (change of) liver, not elsewhere classified: Secondary | ICD-10-CM

## 2015-08-20 DIAGNOSIS — D472 Monoclonal gammopathy: Secondary | ICD-10-CM | POA: Diagnosis not present

## 2015-08-20 DIAGNOSIS — Z79899 Other long term (current) drug therapy: Secondary | ICD-10-CM | POA: Insufficient documentation

## 2015-08-20 DIAGNOSIS — I081 Rheumatic disorders of both mitral and tricuspid valves: Secondary | ICD-10-CM | POA: Diagnosis not present

## 2015-08-20 DIAGNOSIS — E039 Hypothyroidism, unspecified: Secondary | ICD-10-CM | POA: Diagnosis not present

## 2015-08-20 DIAGNOSIS — J449 Chronic obstructive pulmonary disease, unspecified: Secondary | ICD-10-CM

## 2015-08-20 DIAGNOSIS — E785 Hyperlipidemia, unspecified: Secondary | ICD-10-CM | POA: Diagnosis not present

## 2015-08-20 DIAGNOSIS — D72819 Decreased white blood cell count, unspecified: Secondary | ICD-10-CM | POA: Insufficient documentation

## 2015-08-20 DIAGNOSIS — D696 Thrombocytopenia, unspecified: Secondary | ICD-10-CM | POA: Diagnosis not present

## 2015-08-20 DIAGNOSIS — R3129 Other microscopic hematuria: Secondary | ICD-10-CM | POA: Diagnosis not present

## 2015-08-20 DIAGNOSIS — R32 Unspecified urinary incontinence: Secondary | ICD-10-CM | POA: Diagnosis not present

## 2015-08-20 HISTORY — DX: Monoclonal gammopathy: D47.2

## 2015-08-20 LAB — COMPREHENSIVE METABOLIC PANEL
ALK PHOS: 91 U/L (ref 38–126)
ALT: 30 U/L (ref 14–54)
AST: 39 U/L (ref 15–41)
Albumin: 4 g/dL (ref 3.5–5.0)
Anion gap: 6 (ref 5–15)
BUN: 18 mg/dL (ref 6–20)
CALCIUM: 9 mg/dL (ref 8.9–10.3)
CHLORIDE: 97 mmol/L — AB (ref 101–111)
CO2: 30 mmol/L (ref 22–32)
CREATININE: 0.65 mg/dL (ref 0.44–1.00)
GFR calc Af Amer: >60 mL/min (ref >60)
GFR calc non Af Amer: >60 mL/min (ref >60)
GLUCOSE: 119 mg/dL — AB (ref 65–99)
Potassium: 3.8 mmol/L (ref 3.5–5.1)
SODIUM: 133 mmol/L — AB (ref 135–145)
Total Bilirubin: 0.7 mg/dL (ref 0.3–1.2)
Total Protein: 7.9 g/dL (ref 6.5–8.1)

## 2015-08-20 LAB — CBC WITH DIFFERENTIAL/PLATELET
BASOS PCT: 1 %
Basophils Absolute: 0 10*3/uL (ref 0–0.1)
EOS ABS: 0.1 10*3/uL (ref 0–0.7)
Eosinophils Relative: 4 %
HCT: 38.1 % (ref 35.0–47.0)
HEMOGLOBIN: 13.5 g/dL (ref 12.0–16.0)
Lymphocytes Relative: 29 %
Lymphs Abs: 0.8 10*3/uL — ABNORMAL LOW (ref 1.0–3.6)
MCH: 33 pg (ref 26.0–34.0)
MCHC: 35.5 g/dL (ref 32.0–36.0)
MCV: 92.8 fL (ref 80.0–100.0)
Monocytes Absolute: 0.3 10*3/uL (ref 0.2–0.9)
Monocytes Relative: 12 %
Neutro Abs: 1.4 10*3/uL (ref 1.4–6.5)
Neutrophils Relative %: 54 %
PLATELETS: 108 10*3/uL — AB (ref 150–440)
RBC: 4.1 MIL/uL (ref 3.80–5.20)
RDW: 13.5 % (ref 11.5–14.5)
WBC: 2.6 10*3/uL — AB (ref 3.6–11.0)
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
## 2015-08-21 LAB — PROTEIN ELECTROPHORESIS, SERUM
A/G Ratio: 1 (ref 0.7–1.7)
ALBUMIN ELP: 3.7 g/dL (ref 2.9–4.4)
ALPHA-1-GLOBULIN: 0.2 g/dL (ref 0.0–0.4)
ALPHA-2-GLOBULIN: 0.7 g/dL (ref 0.4–1.0)
Beta Globulin: 1 g/dL (ref 0.7–1.3)
GLOBULIN, TOTAL: 3.6 g/dL (ref 2.2–3.9)
Gamma Globulin: 1.6 g/dL (ref 0.4–1.8)
M-SPIKE, %: 0.3 g/dL — AB
Total Protein ELP: 7.3 g/dL (ref 6.0–8.5)

## 2015-08-21 LAB — KAPPA/LAMBDA LIGHT CHAINS
Kappa free light chain: 41.45 mg/L — ABNORMAL HIGH (ref 3.30–19.40)
Kappa, lambda light chain ratio: 1.35 (ref 0.26–1.65)
Lambda free light chains: 30.7 mg/L — ABNORMAL HIGH (ref 5.71–26.30)

## 2015-08-22 ENCOUNTER — Ambulatory Visit: Payer: Commercial Managed Care - HMO

## 2015-08-23 ENCOUNTER — Ambulatory Visit
Admission: RE | Admit: 2015-08-23 | Discharge: 2015-08-23 | Disposition: A | Discharge status: Home / Self Care | Payer: Commercial Managed Care - HMO | Source: Ambulatory Visit | Attending: Gastroenterology | Admitting: Gastroenterology

## 2015-08-23 DIAGNOSIS — Z9049 Acquired absence of other specified parts of digestive tract: Secondary | ICD-10-CM | POA: Diagnosis not present

## 2015-08-23 DIAGNOSIS — K760 Fatty (change of) liver, not elsewhere classified: Secondary | ICD-10-CM | POA: Diagnosis not present

## 2015-09-06 ENCOUNTER — Other Ambulatory Visit: Payer: Self-pay | Admitting: Gastroenterology

## 2015-09-06 DIAGNOSIS — K760 Fatty (change of) liver, not elsewhere classified: Secondary | ICD-10-CM

## 2015-09-11 ENCOUNTER — Ambulatory Visit
Admission: RE | Admit: 2015-09-11 | Discharge: 2015-09-11 | Disposition: A | Discharge status: Home / Self Care | Payer: Commercial Managed Care - HMO | Source: Ambulatory Visit | Attending: Gastroenterology | Admitting: Gastroenterology

## 2015-09-11 DIAGNOSIS — R932 Abnormal findings on diagnostic imaging of liver and biliary tract: Secondary | ICD-10-CM | POA: Diagnosis not present

## 2015-09-11 DIAGNOSIS — K760 Fatty (change of) liver, not elsewhere classified: Secondary | ICD-10-CM | POA: Insufficient documentation

## 2015-09-16 DIAGNOSIS — M545 Low back pain: Secondary | ICD-10-CM | POA: Diagnosis not present

## 2015-09-16 DIAGNOSIS — M064 Inflammatory polyarthropathy: Secondary | ICD-10-CM | POA: Diagnosis not present

## 2015-09-16 DIAGNOSIS — J309 Allergic rhinitis, unspecified: Secondary | ICD-10-CM | POA: Diagnosis not present

## 2015-09-16 DIAGNOSIS — E039 Hypothyroidism, unspecified: Secondary | ICD-10-CM | POA: Diagnosis not present

## 2015-09-16 DIAGNOSIS — I10 Essential (primary) hypertension: Secondary | ICD-10-CM | POA: Diagnosis not present

## 2016-01-07 DIAGNOSIS — E039 Hypothyroidism, unspecified: Secondary | ICD-10-CM | POA: Diagnosis not present

## 2016-01-07 DIAGNOSIS — M79672 Pain in left foot: Secondary | ICD-10-CM | POA: Diagnosis not present

## 2016-01-07 DIAGNOSIS — M150 Primary generalized (osteo)arthritis: Secondary | ICD-10-CM | POA: Diagnosis not present

## 2016-01-07 DIAGNOSIS — I10 Essential (primary) hypertension: Secondary | ICD-10-CM | POA: Diagnosis not present

## 2016-01-07 DIAGNOSIS — K219 Gastro-esophageal reflux disease without esophagitis: Secondary | ICD-10-CM | POA: Diagnosis not present

## 2016-01-07 DIAGNOSIS — D72819 Decreased white blood cell count, unspecified: Secondary | ICD-10-CM | POA: Diagnosis not present

## 2016-01-07 DIAGNOSIS — Z0001 Encounter for general adult medical examination with abnormal findings: Secondary | ICD-10-CM | POA: Diagnosis not present

## 2016-01-07 DIAGNOSIS — R609 Edema, unspecified: Secondary | ICD-10-CM | POA: Diagnosis not present

## 2016-01-13 DIAGNOSIS — E039 Hypothyroidism, unspecified: Secondary | ICD-10-CM | POA: Diagnosis not present

## 2016-01-13 DIAGNOSIS — D72819 Decreased white blood cell count, unspecified: Secondary | ICD-10-CM | POA: Diagnosis not present

## 2016-01-13 DIAGNOSIS — M064 Inflammatory polyarthropathy: Secondary | ICD-10-CM | POA: Diagnosis not present

## 2016-01-29 DIAGNOSIS — I349 Nonrheumatic mitral valve disorder, unspecified: Secondary | ICD-10-CM | POA: Diagnosis not present

## 2016-01-29 DIAGNOSIS — R609 Edema, unspecified: Secondary | ICD-10-CM | POA: Diagnosis not present

## 2016-02-21 DIAGNOSIS — R011 Cardiac murmur, unspecified: Secondary | ICD-10-CM | POA: Diagnosis not present

## 2016-02-21 DIAGNOSIS — D72819 Decreased white blood cell count, unspecified: Secondary | ICD-10-CM | POA: Diagnosis not present

## 2016-02-21 DIAGNOSIS — M150 Primary generalized (osteo)arthritis: Secondary | ICD-10-CM | POA: Diagnosis not present

## 2016-02-21 DIAGNOSIS — R21 Rash and other nonspecific skin eruption: Secondary | ICD-10-CM | POA: Diagnosis not present

## 2016-02-21 DIAGNOSIS — L03119 Cellulitis of unspecified part of limb: Secondary | ICD-10-CM | POA: Diagnosis not present

## 2016-02-21 DIAGNOSIS — E039 Hypothyroidism, unspecified: Secondary | ICD-10-CM | POA: Diagnosis not present

## 2016-02-21 DIAGNOSIS — I10 Essential (primary) hypertension: Secondary | ICD-10-CM | POA: Diagnosis not present

## 2016-03-26 DIAGNOSIS — L03116 Cellulitis of left lower limb: Secondary | ICD-10-CM | POA: Diagnosis not present

## 2016-03-26 DIAGNOSIS — M064 Inflammatory polyarthropathy: Secondary | ICD-10-CM | POA: Diagnosis not present

## 2016-03-30 ENCOUNTER — Other Ambulatory Visit: Payer: Self-pay | Admitting: Physician Assistant

## 2016-03-30 ENCOUNTER — Ambulatory Visit
Admission: RE | Admit: 2016-03-30 | Discharge: 2016-03-30 | Disposition: A | Discharge status: Home / Self Care | Payer: Commercial Managed Care - HMO | Source: Ambulatory Visit | Attending: Physician Assistant | Admitting: Physician Assistant

## 2016-03-30 DIAGNOSIS — M25572 Pain in left ankle and joints of left foot: Secondary | ICD-10-CM

## 2016-03-30 DIAGNOSIS — M7989 Other specified soft tissue disorders: Secondary | ICD-10-CM | POA: Insufficient documentation

## 2016-03-30 DIAGNOSIS — N064 Isolated proteinuria with diffuse endocapillary proliferative glomerulonephritis: Secondary | ICD-10-CM | POA: Diagnosis not present

## 2016-03-30 DIAGNOSIS — M898X6 Other specified disorders of bone, lower leg: Secondary | ICD-10-CM

## 2016-03-30 DIAGNOSIS — M79662 Pain in left lower leg: Secondary | ICD-10-CM | POA: Diagnosis not present

## 2016-04-02 DIAGNOSIS — N390 Urinary tract infection, site not specified: Secondary | ICD-10-CM | POA: Diagnosis not present

## 2016-04-02 DIAGNOSIS — M064 Inflammatory polyarthropathy: Secondary | ICD-10-CM | POA: Diagnosis not present

## 2016-04-02 DIAGNOSIS — L03116 Cellulitis of left lower limb: Secondary | ICD-10-CM | POA: Diagnosis not present

## 2016-04-02 DIAGNOSIS — N189 Chronic kidney disease, unspecified: Secondary | ICD-10-CM | POA: Diagnosis not present

## 2016-04-02 DIAGNOSIS — Z23 Encounter for immunization: Secondary | ICD-10-CM | POA: Diagnosis not present

## 2016-04-08 DIAGNOSIS — N189 Chronic kidney disease, unspecified: Secondary | ICD-10-CM | POA: Diagnosis not present

## 2016-04-08 DIAGNOSIS — M064 Inflammatory polyarthropathy: Secondary | ICD-10-CM | POA: Diagnosis not present

## 2016-04-23 DIAGNOSIS — R740 Nonspecific elevation of levels of transaminase and lactic acid dehydrogenase [LDH]: Secondary | ICD-10-CM | POA: Diagnosis not present

## 2016-04-23 DIAGNOSIS — N189 Chronic kidney disease, unspecified: Secondary | ICD-10-CM | POA: Diagnosis not present

## 2016-04-23 DIAGNOSIS — R609 Edema, unspecified: Secondary | ICD-10-CM | POA: Diagnosis not present

## 2016-04-23 DIAGNOSIS — M150 Primary generalized (osteo)arthritis: Secondary | ICD-10-CM | POA: Diagnosis not present

## 2016-04-28 DIAGNOSIS — R600 Localized edema: Secondary | ICD-10-CM | POA: Diagnosis not present

## 2016-04-28 DIAGNOSIS — Z981 Arthrodesis status: Secondary | ICD-10-CM | POA: Diagnosis not present

## 2016-05-02 DIAGNOSIS — Z981 Arthrodesis status: Secondary | ICD-10-CM | POA: Insufficient documentation

## 2016-05-28 DIAGNOSIS — E039 Hypothyroidism, unspecified: Secondary | ICD-10-CM | POA: Diagnosis not present

## 2016-05-28 DIAGNOSIS — I10 Essential (primary) hypertension: Secondary | ICD-10-CM | POA: Diagnosis not present

## 2016-05-28 DIAGNOSIS — J019 Acute sinusitis, unspecified: Secondary | ICD-10-CM | POA: Diagnosis not present

## 2016-05-28 DIAGNOSIS — J309 Allergic rhinitis, unspecified: Secondary | ICD-10-CM | POA: Diagnosis not present

## 2016-05-28 DIAGNOSIS — M150 Primary generalized (osteo)arthritis: Secondary | ICD-10-CM | POA: Diagnosis not present

## 2016-05-29 ENCOUNTER — Other Ambulatory Visit: Payer: Self-pay | Admitting: Gastroenterology

## 2016-05-29 DIAGNOSIS — K760 Fatty (change of) liver, not elsewhere classified: Secondary | ICD-10-CM

## 2016-06-03 ENCOUNTER — Ambulatory Visit
Admission: RE | Admit: 2016-06-03 | Discharge: 2016-06-03 | Disposition: A | Discharge status: Home / Self Care | Payer: Commercial Managed Care - HMO | Source: Ambulatory Visit | Attending: Gastroenterology | Admitting: Gastroenterology

## 2016-06-03 DIAGNOSIS — K760 Fatty (change of) liver, not elsewhere classified: Secondary | ICD-10-CM | POA: Insufficient documentation

## 2016-06-03 DIAGNOSIS — Z9049 Acquired absence of other specified parts of digestive tract: Secondary | ICD-10-CM | POA: Insufficient documentation

## 2016-06-03 DIAGNOSIS — N281 Cyst of kidney, acquired: Secondary | ICD-10-CM | POA: Insufficient documentation

## 2016-07-23 ENCOUNTER — Other Ambulatory Visit: Payer: Self-pay | Admitting: Gastroenterology

## 2016-07-23 DIAGNOSIS — N289 Disorder of kidney and ureter, unspecified: Secondary | ICD-10-CM

## 2016-07-23 DIAGNOSIS — K760 Fatty (change of) liver, not elsewhere classified: Secondary | ICD-10-CM | POA: Diagnosis not present

## 2016-07-30 DIAGNOSIS — R944 Abnormal results of kidney function studies: Secondary | ICD-10-CM | POA: Diagnosis not present

## 2016-07-30 DIAGNOSIS — R10812 Left upper quadrant abdominal tenderness: Secondary | ICD-10-CM | POA: Diagnosis not present

## 2016-07-30 DIAGNOSIS — I10 Essential (primary) hypertension: Secondary | ICD-10-CM | POA: Diagnosis not present

## 2016-07-30 DIAGNOSIS — R609 Edema, unspecified: Secondary | ICD-10-CM | POA: Diagnosis not present

## 2016-07-30 DIAGNOSIS — D4102 Neoplasm of uncertain behavior of left kidney: Secondary | ICD-10-CM | POA: Diagnosis not present

## 2016-08-03 ENCOUNTER — Other Ambulatory Visit: Payer: Self-pay | Admitting: Nurse Practitioner

## 2016-08-03 DIAGNOSIS — R10812 Left upper quadrant abdominal tenderness: Secondary | ICD-10-CM

## 2016-08-05 DIAGNOSIS — R601 Generalized edema: Secondary | ICD-10-CM | POA: Diagnosis not present

## 2016-08-05 DIAGNOSIS — R609 Edema, unspecified: Secondary | ICD-10-CM | POA: Diagnosis not present

## 2016-08-06 ENCOUNTER — Inpatient Hospital Stay
Admission: RE | Admit: 2016-08-06 | Discharge: 2016-08-06 | Disposition: A | Discharge status: Home / Self Care | Payer: Commercial Managed Care - HMO | Source: Ambulatory Visit | Attending: Nurse Practitioner | Admitting: Nurse Practitioner

## 2016-08-06 ENCOUNTER — Ambulatory Visit
Admission: RE | Admit: 2016-08-06 | Discharge: 2016-08-06 | Disposition: A | Discharge status: Home / Self Care | Payer: Medicare HMO | Source: Ambulatory Visit | Attending: Nurse Practitioner | Admitting: Nurse Practitioner

## 2016-08-06 DIAGNOSIS — N993 Prolapse of vaginal vault after hysterectomy: Secondary | ICD-10-CM | POA: Insufficient documentation

## 2016-08-06 DIAGNOSIS — R10812 Left upper quadrant abdominal tenderness: Secondary | ICD-10-CM

## 2016-08-06 DIAGNOSIS — Z9049 Acquired absence of other specified parts of digestive tract: Secondary | ICD-10-CM | POA: Diagnosis not present

## 2016-08-06 DIAGNOSIS — R1012 Left upper quadrant pain: Secondary | ICD-10-CM | POA: Diagnosis not present

## 2016-08-06 MED ORDER — IOPAMIDOL (ISOVUE-300) INJECTION 61%
100.0000 mL | Freq: Once | INTRAVENOUS | Status: AC | PRN
  Administered 2016-08-06: 100 mL via INTRAVENOUS

## 2016-08-11 DIAGNOSIS — Z1231 Encounter for screening mammogram for malignant neoplasm of breast: Secondary | ICD-10-CM | POA: Diagnosis not present

## 2016-08-18 DIAGNOSIS — I10 Essential (primary) hypertension: Secondary | ICD-10-CM | POA: Diagnosis not present

## 2016-08-18 DIAGNOSIS — R944 Abnormal results of kidney function studies: Secondary | ICD-10-CM | POA: Diagnosis not present

## 2016-08-18 DIAGNOSIS — I6523 Occlusion and stenosis of bilateral carotid arteries: Secondary | ICD-10-CM | POA: Diagnosis not present

## 2016-08-18 DIAGNOSIS — R011 Cardiac murmur, unspecified: Secondary | ICD-10-CM | POA: Diagnosis not present

## 2016-08-18 DIAGNOSIS — E039 Hypothyroidism, unspecified: Secondary | ICD-10-CM | POA: Diagnosis not present

## 2016-08-18 NOTE — Progress Notes (Signed)
[facility]  [phone]  [phone]     Martha Peterson DOB: 04/20/1941  MR#: [id number]  CSN#:[id number]  Patient Care Team: Fozia M Khan, MD as PCP - General (Internal Medicine)  CHIEF COMPLAINT: MGUS  INTERVAL HISTORY: Patient returns to clinic today for repeat laboratory work and further evaluation. She currently feels well and is asymptomatic. She has no neurologic complaints. She denies any recent fevers or illnesses. She has a good appetite and denies weight loss. She has no chest pain or shortness of breath. She denies any nausea, vomiting, constipation, or diarrhea. She has no urinary complaints. Patient denies any easy bleeding or bruising. She feels at her baseline and offers no specific complaints today.  REVIEW OF SYSTEMS:   Review of Systems  Constitutional: Negative.  Negative for fever, malaise/fatigue and weight loss.  Respiratory: Negative.  Negative for cough and shortness of breath.   Cardiovascular: Negative.  Negative for chest pain.  Gastrointestinal: Negative.  Negative for abdominal pain.  Genitourinary: Negative.   Musculoskeletal: Negative.   Neurological: Negative.  Negative for weakness.  Endo/Heme/Allergies: Does not bruise/bleed easily.  Psychiatric/Behavioral: Negative.  The patient is not nervous/anxious.    As per HPI. Otherwise, a complete review of systems is negative.   PAST MEDICAL HISTORY: Past Medical History:  Diagnosis Date  . Arthritis   . COPD (chronic obstructive pulmonary disease) (HCC)   . Diverticulitis   . Gout   . Hyperlipidemia   . Hypertension   . Hypothyroidism   . 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 Microhematuria   . Mild mitral regurgitation   . Mild tricuspid regurgitation   . Osteopenia   . Pulmonary fibrosis (HCC)   . Urinary incontinence     PAST SURGICAL HISTORY: Past Surgical History:  Procedure Laterality Date  . ABDOMINAL HYSTERECTOMY      . APPENDECTOMY    . CHOLECYSTECTOMY    . COLONOSCOPY WITH PROPOFOL N/A 12/04/2014   Procedure: COLONOSCOPY WITH PROPOFOL;  Surgeon: Martin U Skulskie, MD;  Location: [facility] ENDOSCOPY;  Service: Endoscopy;  Laterality: N/A;  . FRACTURE SURGERY     Femur Fx; LT Ankle Pinning  . JOINT REPLACEMENT     RT TKR  . Patial Thyroid Resection      FAMILY HISTORY: Reviewed and unchanged. No reported history of malignancy or chronic disease.   GYNECOLOGIC HISTORY:  No LMP recorded. Patient has had a hysterectomy.     ADVANCED DIRECTIVES:    HEALTH MAINTENANCE: Social History  Substance Use Topics  . Smoking status: Never Smoker  . Smokeless tobacco: Never Used  . Alcohol use No       Allergies  Allergen Reactions  . Codeine   . Levaquin [Levofloxacin]     Current Outpatient Prescriptions  Medication Sig Dispense Refill  . ascorbic acid (VITAMIN C) 500 MG tablet Take 500 mg by mouth daily.    . aspirin EC 81 MG tablet Take 81 mg by mouth daily.    . atorvastatin (LIPITOR) 10 MG tablet Take 10 mg by mouth daily at 6 PM.     . carvedilol (COREG) 25 MG tablet Take 25 mg by mouth 2 (two) times daily with a meal.     . cholecalciferol (VITAMIN D) 400 UNITS TABS tablet Take 1,000 Units by mouth.    . cloNIDine (CATAPRES) 0.1 MG tablet Take 0.1 mg by mouth daily.    . furosemide (LASIX) 40 MG tablet Take 40 mg by mouth   2 (two) times daily.    . levothyroxine (SYNTHROID, LEVOTHROID) 150 MCG tablet Take 300 mcg by mouth daily before breakfast.    . losartan-hydrochlorothiazide (HYZAAR) 100-25 MG per tablet Take 1 tablet by mouth daily.    . magnesium oxide (MAG-OX) 400 MG tablet Take 400 mg by mouth daily.     . meclizine (ANTIVERT) 12.5 MG tablet Take 12.5 mg by mouth as needed.     . omega-3 acid ethyl esters (LOVAZA) 1 G capsule Take 1 g by mouth 2 (two) times daily.    . omeprazole (PRILOSEC) 20 MG capsule Take 20 mg by mouth daily.    . oxycodone (OXY-IR) 5 MG capsule Take 5 mg by  mouth every 4 (four) hours as needed.    . Potassium Gluconate 595 MG CAPS Take by mouth.    . tiZANidine (ZANAFLEX) 4 MG capsule Take 4 mg by mouth at bedtime.    . traMADol (ULTRAM) 50 MG tablet Take by mouth every 8 (eight) hours as needed.    . vitamin E 400 UNIT capsule      No current facility-administered medications for this visit.     OBJECTIVE: BP 135/82 (BP Location: Left Arm, Patient Position: Sitting)   Pulse (!) 57   Temp (!) 96.3 F (35.7 C) (Tympanic)   Resp 18   Wt 211 lb 1.5 oz (95.8 kg)   BMI 36.23 kg/m    Body mass index is 36.23 kg/m.    ECOG FS:0 - Asymptomatic  General: Well-developed, well-nourished, no acute distress. Eyes: Pink conjunctiva, anicteric sclera. HEENT: Normocephalic, moist mucous membranes, clear oropharnyx. Lungs: Clear to auscultation bilaterally. Heart: Regular rate and rhythm. No rubs, murmurs, or gallops. Musculoskeletal: Left lower extremity edema. no cyanosis, or clubbing. Neuro: Alert, answering all questions appropriately. Cranial nerves grossly intact. Skin: No rashes or petechiae noted. Psych: Normal affect. Lymphatics: No cervical, clavicular, or axillary LAD.   LAB RESULTS:  Appointment on 08/19/2016  Component Date Value Ref Range Status  . WBC 08/19/2016 3.4* 3.6 - 11.0 K/uL Final  . RBC 08/19/2016 4.26  3.80 - 5.20 MIL/uL Final  . Hemoglobin 08/19/2016 13.8  12.0 - 16.0 g/dL Final  . HCT 08/19/2016 39.5  35.0 - 47.0 % Final  . MCV 08/19/2016 92.6  80.0 - 100.0 fL Final  . MCH 08/19/2016 32.4  26.0 - 34.0 pg Final  . MCHC 08/19/2016 35.0  32.0 - 36.0 g/dL Final  . RDW 08/19/2016 12.6  11.5 - 14.5 % Final  . Platelets 08/19/2016 129* 150 - 440 K/uL Final  . Neutrophils Relative % 08/19/2016 60  % Final  . Neutro Abs 08/19/2016 2.0  1.4 - 6.5 K/uL Final  . Lymphocytes Relative 08/19/2016 27  % Final  . Lymphs Abs 08/19/2016 0.9* 1.0 - 3.6 K/uL Final  . Monocytes Relative 08/19/2016 9  % Final  . Monocytes Absolute  08/19/2016 0.3  0.2 - 0.9 K/uL Final  . Eosinophils Relative 08/19/2016 3  % Final  . Eosinophils Absolute 08/19/2016 0.1  0 - 0.7 K/uL Final  . Basophils Relative 08/19/2016 1  % Final  . Basophils Absolute 08/19/2016 0.0  0 - 0.1 K/uL Final  . Total Protein ELP 08/19/2016 7.6  6.0 - 8.5 g/dL Final  . Albumin ELP 08/19/2016 3.9  2.9 - 4.4 g/dL Final  . Alpha-1-Globulin 08/19/2016 0.3  0.0 - 0.4 g/dL Final  . Alpha-2-Globulin 08/19/2016 0.7  0.4 - 1.0 g/dL Final  . Beta Globulin 08/19/2016 1.0  0.7 -   1.3 g/dL Final  . Gamma Globulin 08/19/2016 1.7  0.4 - 1.8 g/dL Final  . M-Spike, % 08/19/2016 0.3* Not Observed g/dL Final  . SPE Interp. 08/19/2016 Comment   Final   Comment: (NOTE) The SPE pattern demonstrates a single peak (M-spike) in the gamma region which may represent monoclonal protein. This peak may also be caused by circulating immune complexes, cryoglobulins, C-reactive protein, fibrinogen or hemolysis.  If clinically indicated, the presence of a monoclonal gammopathy may be confirmed by immuno- fixation, as well as an evaluation of the urine for the presence of Bence-Jones protein. Performed At: [facility] [place] [address] [place], [place] [id number] Hancock William F MD Ph:[phone]   . Comment 08/19/2016 Comment   Final   Comment: (NOTE) Protein electrophoresis scan will follow via computer, mail, or courier delivery.   . GLOBULIN, TOTAL 08/19/2016 3.7  2.2 - 3.9 g/dL Corrected  . A/G Ratio 08/19/2016 1.1  0.7 - 1.7 Corrected  . Kappa free light chain 08/19/2016 34.3* 3.3 - 19.4 mg/L Final  . Lamda free light chains 08/19/2016 26.1  5.7 - 26.3 mg/L Final  . Kappa, lamda light chain ratio 08/19/2016 1.31  0.26 - 1.65 Final   Comment: (NOTE) Performed At: [facility] [place] [address] [place], [place] [id number] Hancock William F MD Ph:[phone]   . Sodium 08/19/2016 135  135 - 145 mmol/L Final  . Potassium 08/19/2016 3.3* 3.5 - 5.1 mmol/L Final  .  Chloride 08/19/2016 96* 101 - 111 mmol/L Final  . CO2 08/19/2016 34* 22 - 32 mmol/L Final  . Glucose, Bld 08/19/2016 139* 65 - 99 mg/dL Final  . BUN 08/19/2016 22* 6 - 20 mg/dL Final  . Creatinine, Ser 08/19/2016 0.87  0.44 - 1.00 mg/dL Final  . Calcium 08/19/2016 9.6  8.9 - 10.3 mg/dL Final  . GFR calc non Af Amer 08/19/2016 >60  >60 mL/min Final  . GFR calc Af Amer 08/19/2016 >60  >60 mL/min Final   Comment: (NOTE) The eGFR has been calculated using the CKD EPI equation. This calculation has not been validated in all clinical situations. eGFR's persistently <60 mL/min signify possible Chronic Kidney Disease.   . Anion gap 08/19/2016 5  5 - 15 Final  . IgG (Immunoglobin G), Serum 08/19/2016 1379  700 - 1,600 mg/dL Final  . IgA 08/19/2016 491* 64 - 422 mg/dL Final  . IgM, Serum 08/19/2016 77  26 - 217 mg/dL Final   Comment: (NOTE) Performed At: [facility] [place] [address] [place], [place] [id number] Hancock William F MD Ph:[phone]     STUDIES: No results found.  ASSESSMENT: MGUS  PLAN:   1. MGUS: Patient continues to have a mildly elevated M spike of 0.3, but this is unchanged. She also has a mildly increased IgA level. Her kappa lambda light chain ratio is within normal limits. No intervention is needed at this time. Patient does not require bone marrow biopsy. Return to clinic in 6 months with repeat laboratory work and further evaluation. 2. Leukopenia: Chronic and unchanged since approximately April 2010. 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 By report, previous bone marrow biopsy was negative for underlying pathology. 3. 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  Patient expressed understanding and was in agreement with this plan. She also understands that She can call clinic at any time with any questions, concerns, or complaints.    Timothy J Finnegan, MD   08/23/2016 9:55 AM      

## 2016-08-19 ENCOUNTER — Inpatient Hospital Stay: Payer: Medicare HMO

## 2016-08-19 ENCOUNTER — Inpatient Hospital Stay: Payer: Medicare HMO | Attending: Oncology | Admitting: Oncology

## 2016-08-19 VITALS — BP 135/82 | HR 57 | Temp 96.3°F | Resp 18 | Wt 211.1 lb

## 2016-08-19 DIAGNOSIS — M109 Gout, unspecified: Secondary | ICD-10-CM

## 2016-08-19 DIAGNOSIS — M129 Arthropathy, unspecified: Secondary | ICD-10-CM | POA: Diagnosis not present

## 2016-08-19 DIAGNOSIS — M8580 Other specified disorders of bone density and structure, unspecified site: Secondary | ICD-10-CM | POA: Diagnosis not present

## 2016-08-19 DIAGNOSIS — D72829 Elevated white blood cell count, unspecified: Secondary | ICD-10-CM | POA: Diagnosis not present

## 2016-08-19 DIAGNOSIS — Z90710 Acquired absence of both cervix and uterus: Secondary | ICD-10-CM | POA: Diagnosis not present

## 2016-08-19 DIAGNOSIS — I10 Essential (primary) hypertension: Secondary | ICD-10-CM | POA: Diagnosis not present

## 2016-08-19 DIAGNOSIS — Z79899 Other long term (current) drug therapy: Secondary | ICD-10-CM | POA: Diagnosis not present

## 2016-08-19 DIAGNOSIS — D696 Thrombocytopenia, unspecified: Secondary | ICD-10-CM | POA: Diagnosis not present

## 2016-08-19 DIAGNOSIS — D472 Monoclonal gammopathy: Secondary | ICD-10-CM

## 2016-08-19 DIAGNOSIS — Z7982 Long term (current) use of aspirin: Secondary | ICD-10-CM | POA: Diagnosis not present

## 2016-08-19 DIAGNOSIS — J449 Chronic obstructive pulmonary disease, unspecified: Secondary | ICD-10-CM

## 2016-08-19 DIAGNOSIS — J8410 Pulmonary fibrosis, unspecified: Secondary | ICD-10-CM | POA: Diagnosis not present

## 2016-08-19 DIAGNOSIS — E039 Hypothyroidism, unspecified: Secondary | ICD-10-CM | POA: Diagnosis not present

## 2016-08-19 DIAGNOSIS — E785 Hyperlipidemia, unspecified: Secondary | ICD-10-CM

## 2016-08-19 DIAGNOSIS — R3129 Other microscopic hematuria: Secondary | ICD-10-CM | POA: Diagnosis not present

## 2016-08-19 DIAGNOSIS — Z8719 Personal history of other diseases of the digestive system: Secondary | ICD-10-CM

## 2016-08-19 LAB — BASIC METABOLIC PANEL
Anion gap: 5 (ref 5–15)
BUN: 22 mg/dL — AB (ref 6–20)
CHLORIDE: 96 mmol/L — AB (ref 101–111)
CO2: 34 mmol/L — ABNORMAL HIGH (ref 22–32)
Calcium: 9.6 mg/dL (ref 8.9–10.3)
Creatinine, Ser: 0.87 mg/dL (ref 0.44–1.00)
GFR calc Af Amer: >60 mL/min (ref >60)
GFR calc non Af Amer: >60 mL/min (ref >60)
GLUCOSE: 139 mg/dL — AB (ref 65–99)
POTASSIUM: 3.3 mmol/L — AB (ref 3.5–5.1)
SODIUM: 135 mmol/L (ref 135–145)

## 2016-08-19 LAB — CBC WITH DIFFERENTIAL/PLATELET
Basophils Absolute: 0 10*3/uL (ref 0–0.1)
Basophils Relative: 1 %
EOS ABS: 0.1 10*3/uL (ref 0–0.7)
EOS PCT: 3 %
HCT: 39.5 % (ref 35.0–47.0)
HEMOGLOBIN: 13.8 g/dL (ref 12.0–16.0)
LYMPHS ABS: 0.9 10*3/uL — AB (ref 1.0–3.6)
LYMPHS PCT: 27 %
MCH: 32.4 pg (ref 26.0–34.0)
MCHC: 35 g/dL (ref 32.0–36.0)
MCV: 92.6 fL (ref 80.0–100.0)
MONOS PCT: 9 %
Monocytes Absolute: 0.3 10*3/uL (ref 0.2–0.9)
NEUTROS PCT: 60 %
Neutro Abs: 2 10*3/uL (ref 1.4–6.5)
Platelets: 129 10*3/uL — ABNORMAL LOW (ref 150–440)
RBC: 4.26 MIL/uL (ref 3.80–5.20)
RDW: 12.6 % (ref 11.5–14.5)
WBC: 3.4 10*3/uL — ABNORMAL LOW (ref 3.6–11.0)

## 2016-08-19 NOTE — Progress Notes (Signed)
Offers no complaints. States is feeling well. 

## 2016-08-20 LAB — KAPPA/LAMBDA LIGHT CHAINS
KAPPA FREE LGHT CHN: 34.3 mg/L — AB (ref 3.3–19.4)
Kappa, lambda light chain ratio: 1.31 (ref 0.26–1.65)
Lambda free light chains: 26.1 mg/L (ref 5.7–26.3)

## 2016-08-20 LAB — PROTEIN ELECTROPHORESIS, SERUM
A/G Ratio: 1.1 (ref 0.7–1.7)
ALBUMIN ELP: 3.9 g/dL (ref 2.9–4.4)
ALPHA-1-GLOBULIN: 0.3 g/dL (ref 0.0–0.4)
Alpha-2-Globulin: 0.7 g/dL (ref 0.4–1.0)
BETA GLOBULIN: 1 g/dL (ref 0.7–1.3)
Gamma Globulin: 1.7 g/dL (ref 0.4–1.8)
Globulin, Total: 3.7 g/dL (ref 2.2–3.9)
M-SPIKE, %: 0.3 g/dL — AB
TOTAL PROTEIN ELP: 7.6 g/dL (ref 6.0–8.5)

## 2016-08-20 LAB — IGG, IGA, IGM
IGA: 491 mg/dL — AB (ref 64–422)
IGG (IMMUNOGLOBIN G), SERUM: 1379 mg/dL (ref 700–1600)
IgM, Serum: 77 mg/dL (ref 26–217)

## 2016-09-30 DIAGNOSIS — R0989 Other specified symptoms and signs involving the circulatory and respiratory systems: Secondary | ICD-10-CM | POA: Diagnosis not present

## 2016-10-05 DIAGNOSIS — E039 Hypothyroidism, unspecified: Secondary | ICD-10-CM | POA: Diagnosis not present

## 2016-10-05 DIAGNOSIS — K7460 Unspecified cirrhosis of liver: Secondary | ICD-10-CM | POA: Diagnosis not present

## 2016-10-05 DIAGNOSIS — I6523 Occlusion and stenosis of bilateral carotid arteries: Secondary | ICD-10-CM | POA: Diagnosis not present

## 2016-10-05 DIAGNOSIS — I10 Essential (primary) hypertension: Secondary | ICD-10-CM | POA: Diagnosis not present

## 2016-10-05 DIAGNOSIS — M150 Primary generalized (osteo)arthritis: Secondary | ICD-10-CM | POA: Diagnosis not present

## 2016-10-23 ENCOUNTER — Other Ambulatory Visit: Payer: Self-pay | Admitting: Gastroenterology

## 2016-10-23 DIAGNOSIS — N289 Disorder of kidney and ureter, unspecified: Secondary | ICD-10-CM

## 2016-10-23 DIAGNOSIS — K760 Fatty (change of) liver, not elsewhere classified: Secondary | ICD-10-CM

## 2016-11-17 ENCOUNTER — Ambulatory Visit
Admission: RE | Admit: 2016-11-17 | Discharge: 2016-11-17 | Disposition: A | Discharge status: Home / Self Care | Payer: Medicare HMO | Source: Ambulatory Visit | Attending: Gastroenterology | Admitting: Gastroenterology

## 2016-11-17 DIAGNOSIS — N289 Disorder of kidney and ureter, unspecified: Secondary | ICD-10-CM | POA: Insufficient documentation

## 2016-11-17 DIAGNOSIS — R109 Unspecified abdominal pain: Secondary | ICD-10-CM | POA: Diagnosis not present

## 2016-11-17 DIAGNOSIS — Z90710 Acquired absence of both cervix and uterus: Secondary | ICD-10-CM | POA: Insufficient documentation

## 2016-11-17 DIAGNOSIS — K760 Fatty (change of) liver, not elsewhere classified: Secondary | ICD-10-CM

## 2016-11-17 DIAGNOSIS — R140 Abdominal distension (gaseous): Secondary | ICD-10-CM | POA: Diagnosis not present

## 2016-11-17 DIAGNOSIS — R932 Abnormal findings on diagnostic imaging of liver and biliary tract: Secondary | ICD-10-CM | POA: Diagnosis not present

## 2016-11-19 DIAGNOSIS — R5383 Other fatigue: Secondary | ICD-10-CM | POA: Diagnosis not present

## 2016-11-19 DIAGNOSIS — I10 Essential (primary) hypertension: Secondary | ICD-10-CM | POA: Diagnosis not present

## 2016-11-19 DIAGNOSIS — D72829 Elevated white blood cell count, unspecified: Secondary | ICD-10-CM | POA: Diagnosis not present

## 2016-11-19 DIAGNOSIS — E039 Hypothyroidism, unspecified: Secondary | ICD-10-CM | POA: Diagnosis not present

## 2017-01-21 DIAGNOSIS — R609 Edema, unspecified: Secondary | ICD-10-CM | POA: Diagnosis not present

## 2017-01-21 DIAGNOSIS — I10 Essential (primary) hypertension: Secondary | ICD-10-CM | POA: Diagnosis not present

## 2017-01-21 DIAGNOSIS — M79672 Pain in left foot: Secondary | ICD-10-CM | POA: Diagnosis not present

## 2017-01-21 DIAGNOSIS — L03116 Cellulitis of left lower limb: Secondary | ICD-10-CM | POA: Diagnosis not present

## 2017-02-05 DIAGNOSIS — I10 Essential (primary) hypertension: Secondary | ICD-10-CM | POA: Diagnosis not present

## 2017-02-05 DIAGNOSIS — M150 Primary generalized (osteo)arthritis: Secondary | ICD-10-CM | POA: Diagnosis not present

## 2017-02-05 DIAGNOSIS — E039 Hypothyroidism, unspecified: Secondary | ICD-10-CM | POA: Diagnosis not present

## 2017-02-05 DIAGNOSIS — R609 Edema, unspecified: Secondary | ICD-10-CM | POA: Diagnosis not present

## 2017-02-05 DIAGNOSIS — Z0001 Encounter for general adult medical examination with abnormal findings: Secondary | ICD-10-CM | POA: Diagnosis not present

## 2017-02-05 DIAGNOSIS — L03116 Cellulitis of left lower limb: Secondary | ICD-10-CM | POA: Diagnosis not present

## 2017-02-05 DIAGNOSIS — K219 Gastro-esophageal reflux disease without esophagitis: Secondary | ICD-10-CM | POA: Diagnosis not present

## 2017-02-18 ENCOUNTER — Inpatient Hospital Stay: Payer: Medicare HMO | Attending: Oncology

## 2017-02-18 DIAGNOSIS — D472 Monoclonal gammopathy: Secondary | ICD-10-CM | POA: Insufficient documentation

## 2017-02-18 LAB — CBC WITH DIFFERENTIAL/PLATELET
BASOS PCT: 1 %
Basophils Absolute: 0 10*3/uL (ref 0–0.1)
Eosinophils Absolute: 0.3 10*3/uL (ref 0–0.7)
Eosinophils Relative: 9 %
HEMATOCRIT: 36.6 % (ref 35.0–47.0)
HEMOGLOBIN: 13.1 g/dL (ref 12.0–16.0)
LYMPHS ABS: 0.9 10*3/uL — AB (ref 1.0–3.6)
LYMPHS PCT: 27 %
MCH: 33.3 pg (ref 26.0–34.0)
MCHC: 35.9 g/dL (ref 32.0–36.0)
MCV: 92.7 fL (ref 80.0–100.0)
MONO ABS: 0.4 10*3/uL (ref 0.2–0.9)
MONOS PCT: 12 %
NEUTROS ABS: 1.7 10*3/uL (ref 1.4–6.5)
NEUTROS PCT: 51 %
Platelets: 122 10*3/uL — ABNORMAL LOW (ref 150–440)
RBC: 3.94 MIL/uL (ref 3.80–5.20)
RDW: 12.7 % (ref 11.5–14.5)
WBC: 3.3 10*3/uL — ABNORMAL LOW (ref 3.6–11.0)

## 2017-02-18 LAB — BASIC METABOLIC PANEL
Anion gap: 10 (ref 5–15)
BUN: 14 mg/dL (ref 6–20)
CALCIUM: 9.4 mg/dL (ref 8.9–10.3)
CO2: 32 mmol/L (ref 22–32)
CREATININE: 0.83 mg/dL (ref 0.44–1.00)
Chloride: 92 mmol/L — ABNORMAL LOW (ref 101–111)
GFR calc Af Amer: >60 mL/min (ref >60)
GFR calc non Af Amer: >60 mL/min (ref >60)
GLUCOSE: 129 mg/dL — AB (ref 65–99)
Potassium: 3.1 mmol/L — ABNORMAL LOW (ref 3.5–5.1)
Sodium: 134 mmol/L — ABNORMAL LOW (ref 135–145)

## 2017-02-19 LAB — KAPPA/LAMBDA LIGHT CHAINS
Kappa free light chain: 36.1 mg/L — ABNORMAL HIGH (ref 3.3–19.4)
Kappa, lambda light chain ratio: 1.11 (ref 0.26–1.65)
Lambda free light chains: 32.5 mg/L — ABNORMAL HIGH (ref 5.7–26.3)

## 2017-02-19 LAB — IGG, IGA, IGM
IGA: 428 mg/dL — AB (ref 64–422)
IgG (Immunoglobin G), Serum: 1417 mg/dL (ref 700–1600)
IgM (Immunoglobulin M), Srm: 64 mg/dL (ref 26–217)

## 2017-02-19 LAB — PROTEIN ELECTROPHORESIS, SERUM
A/G RATIO SPE: 1.1 (ref 0.7–1.7)
ALBUMIN ELP: 3.6 g/dL (ref 2.9–4.4)
ALPHA-1-GLOBULIN: 0.3 g/dL (ref 0.0–0.4)
ALPHA-2-GLOBULIN: 0.6 g/dL (ref 0.4–1.0)
BETA GLOBULIN: 0.9 g/dL (ref 0.7–1.3)
GLOBULIN, TOTAL: 3.4 g/dL (ref 2.2–3.9)
Gamma Globulin: 1.6 g/dL (ref 0.4–1.8)
M-Spike, %: 0.3 g/dL — ABNORMAL HIGH
Total Protein ELP: 7 g/dL (ref 6.0–8.5)

## 2017-02-25 ENCOUNTER — Inpatient Hospital Stay: Payer: Medicare HMO | Admitting: Oncology

## 2017-03-15 NOTE — Progress Notes (Signed)
[place]  [phone]  [phone]     Martha Peterson DOB: 31-Aug-1940  MR#: [id number]  PRF#:[id number]  Patient Care Team: [name], MD as PCP - General (Internal Medicine)  CHIEF COMPLAINT: MGUS  INTERVAL HISTORY: Patient returns to clinic today for repeat laboratory work and routine six-month evaluation. She currently feels well and is asymptomatic. She has no neurologic complaints. She denies any recent fevers or illnesses. She has a good appetite and denies weight loss. She has no chest pain or shortness of breath. She denies any nausea, vomiting, constipation, or diarrhea. She has no urinary complaints. Patient denies any easy bleeding or bruising. She feels at her baseline and offers no specific complaints today.  REVIEW OF SYSTEMS:   Review of Systems  Constitutional: Negative.  Negative for fever, malaise/fatigue and weight loss.  Respiratory: Negative.  Negative for cough and shortness of breath.   Cardiovascular: Negative.  Negative for chest pain.  Gastrointestinal: Negative.  Negative for abdominal pain.  Genitourinary: Negative.   Musculoskeletal: Negative.   Neurological: Negative.  Negative for weakness.  Endo/Heme/Allergies: Does not bruise/bleed easily.  Psychiatric/Behavioral: Negative.  The patient is not nervous/anxious.    As per HPI. Otherwise, a complete review of systems is negative.   PAST MEDICAL HISTORY: Past Medical History:  Diagnosis Date  . Arthritis   . COPD (chronic obstructive pulmonary disease) ([place])   . Diverticulitis   . Gout   . Hyperlipidemia   . Hypertension   . Hypothyroidism   . 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 Microhematuria   . Mild mitral regurgitation   . Mild tricuspid regurgitation   . Osteopenia   . Pulmonary fibrosis ([place])   . Urinary incontinence     PAST SURGICAL HISTORY: Past Surgical History:  Procedure Laterality Date  . ABDOMINAL  HYSTERECTOMY    . APPENDECTOMY    . CHOLECYSTECTOMY    . COLONOSCOPY WITH PROPOFOL N/A 12/04/2014   Procedure: COLONOSCOPY WITH PROPOFOL;  Surgeon: Lollie Sails, MD;  Location: [facility] ENDOSCOPY;  Service: Endoscopy;  Laterality: N/A;  . FRACTURE SURGERY     Femur Fx; LT Ankle Pinning  . JOINT REPLACEMENT     RT TKR  . Patial Thyroid Resection      FAMILY HISTORY: Reviewed and unchanged. No reported history of malignancy or chronic disease.   GYNECOLOGIC HISTORY:  No LMP recorded. Patient has had a hysterectomy.     ADVANCED DIRECTIVES:    HEALTH MAINTENANCE: Social History  Substance Use Topics  . Smoking status: Never Smoker  . Smokeless tobacco: Never Used  . Alcohol use No       Allergies  Allergen Reactions  . Codeine   . Levaquin [Levofloxacin]     Current Outpatient Prescriptions  Medication Sig Dispense Refill  . ascorbic acid (VITAMIN C) 500 MG tablet Take 500 mg by mouth daily.    Marland Kitchen aspirin EC 81 MG tablet Take 81 mg by mouth daily.    Marland Kitchen atorvastatin (LIPITOR) 10 MG tablet Take 10 mg by mouth daily at 6 PM.     . carvedilol (COREG) 25 MG tablet Take 12.5 mg by mouth 2 (two) times daily with a meal.     . cholecalciferol (VITAMIN D) 400 UNITS TABS tablet Take 1,000 Units by mouth.    . cloNIDine (CATAPRES) 0.1 MG tablet Take 0.1 mg by mouth daily.    . furosemide (LASIX) 40 MG tablet Take 40 mg by mouth  2 (two) times daily.    Marland Kitchen levothyroxine (SYNTHROID, LEVOTHROID) 150 MCG tablet Take 150 mcg by mouth daily before breakfast.     . losartan-hydrochlorothiazide (HYZAAR) 100-25 MG per tablet Take 1 tablet by mouth daily.    . magnesium oxide (MAG-OX) 400 MG tablet Take 400 mg by mouth daily.     . meclizine (ANTIVERT) 12.5 MG tablet Take 12.5 mg by mouth as needed.     Marland Kitchen omega-3 acid ethyl esters (LOVAZA) 1 G capsule Take 1 g by mouth 2 (two) times daily.    Marland Kitchen omeprazole (PRILOSEC) 20 MG capsule Take 20 mg by mouth 2 (two) times daily.     Marland Kitchen oxycodone  (OXY-IR) 5 MG capsule Take 5 mg by mouth every 4 (four) hours as needed.    . Potassium Gluconate 595 MG CAPS Take by mouth.    Marland Kitchen tiZANidine (ZANAFLEX) 4 MG capsule Take 4 mg by mouth at bedtime.    . traMADol (ULTRAM) 50 MG tablet Take by mouth every 8 (eight) hours as needed.    . vitamin E 400 UNIT capsule Take 400 Units by mouth 2 (two) times daily.      No current facility-administered medications for this visit.     OBJECTIVE: BP (!) 148/76 (BP Location: Left Arm, Patient Position: Sitting)   Pulse 69   Temp 98.6 F (37 C) (Tympanic)   Resp 18   Wt 202 lb 6.4 oz (91.8 kg)   BMI 34.74 kg/m    Body mass index is 34.74 kg/m.    ECOG FS:0 - Asymptomatic  General: Well-developed, well-nourished, no acute distress. Eyes: Pink conjunctiva, anicteric sclera. Lungs: Clear to auscultation bilaterally. Heart: Regular rate and rhythm. No rubs, murmurs, or gallops. Musculoskeletal: Left lower extremity edema. no cyanosis, or clubbing. Neuro: Alert, answering all questions appropriately. Cranial nerves grossly intact. Skin: No rashes or petechiae noted. Psych: Normal affect.  LAB RESULTS:  No visits with results within 3 Day(s) from this visit.  Latest known visit with results is:  Appointment on 02/18/2017  Component Date Value Ref Range Status  . Kappa free light chain 02/18/2017 36.1* 3.3 - 19.4 mg/L Final  . Lamda free light chains 02/18/2017 32.5* 5.7 - 26.3 mg/L Final  . Kappa, lamda light chain ratio 02/18/2017 1.11  0.26 - 1.65 Final   Comment: (NOTE) Performed At: [facility] [place], [place] [id number] Lindon Romp MD [id number]   . Total Protein ELP 02/18/2017 7.0  6.0 - 8.5 g/dL Final  . Albumin ELP 02/18/2017 3.6  2.9 - 4.4 g/dL Final  . Alpha-1-Globulin 02/18/2017 0.3  0.0 - 0.4 g/dL Final  . Alpha-2-Globulin 02/18/2017 0.6  0.4 - 1.0 g/dL Final  . Beta Globulin 02/18/2017 0.9  0.7 - 1.3 g/dL Final  . Gamma Globulin 02/18/2017 1.6   0.4 - 1.8 g/dL Final  . M-Spike, % 02/18/2017 0.3* Not Observed g/dL Final  . SPE Interp. 02/18/2017 Comment   Final   Comment: (NOTE) The SPE pattern demonstrates a single peak (M-spike) in the gamma region which may represent monoclonal protein. This peak may also be caused by circulating immune complexes, cryoglobulins, C-reactive protein, fibrinogen or hemolysis.  If clinically indicated, the presence of a monoclonal gammopathy may be confirmed by immuno- fixation, as well as an evaluation of the urine for the presence of Bence-Jones protein. Performed At: [facility] [place], [place] [id number] Lindon Romp MD [id number]   . Comment 02/18/2017 Comment  Final   Comment: (NOTE) Protein electrophoresis scan will follow via computer, mail, or courier delivery.   Marland Kitchen GLOBULIN, TOTAL 02/18/2017 3.4  2.2 - 3.9 g/dL Corrected  . A/G Ratio 02/18/2017 1.1  0.7 - 1.7 Corrected  . WBC 02/18/2017 3.3* 3.6 - 11.0 K/uL Final  . RBC 02/18/2017 3.94  3.80 - 5.20 MIL/uL Final  . Hemoglobin 02/18/2017 13.1  12.0 - 16.0 g/dL Final  . HCT 02/18/2017 36.6  35.0 - 47.0 % Final  . MCV 02/18/2017 92.7  80.0 - 100.0 fL Final  . MCH 02/18/2017 33.3  26.0 - 34.0 pg Final  . MCHC 02/18/2017 35.9  32.0 - 36.0 g/dL Final  . RDW 02/18/2017 12.7  11.5 - 14.5 % Final  . Platelets 02/18/2017 122* 150 - 440 K/uL Final  . Neutrophils Relative % 02/18/2017 51  % Final  . Neutro Abs 02/18/2017 1.7  1.4 - 6.5 K/uL Final  . Lymphocytes Relative 02/18/2017 27  % Final  . Lymphs Abs 02/18/2017 0.9* 1.0 - 3.6 K/uL Final  . Monocytes Relative 02/18/2017 12  % Final  . Monocytes Absolute 02/18/2017 0.4  0.2 - 0.9 K/uL Final  . Eosinophils Relative 02/18/2017 9  % Final  . Eosinophils Absolute 02/18/2017 0.3  0 - 0.7 K/uL Final  . Basophils Relative 02/18/2017 1  % Final  . Basophils Absolute 02/18/2017 0.0  0 - 0.1 K/uL Final  . Sodium 02/18/2017 134* 135 - 145 mmol/L Final  .  Potassium 02/18/2017 3.1* 3.5 - 5.1 mmol/L Final  . Chloride 02/18/2017 92* 101 - 111 mmol/L Final  . CO2 02/18/2017 32  22 - 32 mmol/L Final  . Glucose, Bld 02/18/2017 129* 65 - 99 mg/dL Final  . BUN 02/18/2017 14  6 - 20 mg/dL Final  . Creatinine, Ser 02/18/2017 0.83  0.44 - 1.00 mg/dL Final  . Calcium 02/18/2017 9.4  8.9 - 10.3 mg/dL Final  . GFR calc non Af Amer 02/18/2017 >60  >60 mL/min Final  . GFR calc Af Amer 02/18/2017 >60  >60 mL/min Final   Comment: (NOTE) The eGFR has been calculated using the CKD EPI equation. This calculation has not been validated in all clinical situations. eGFR's persistently <60 mL/min signify possible Chronic Kidney Disease.   . Anion gap 02/18/2017 10  5 - 15 Final  . IgG (Immunoglobin G), Serum 02/18/2017 1417  700 - 1,600 mg/dL Final  . IgA 02/18/2017 428* 64 - 422 mg/dL Final  . IgM (Immunoglobulin M), Srm 02/18/2017 64  26 - 217 mg/dL Final   Comment: (NOTE) Performed At: [facility] [place], [place] [id number] Lindon Romp MD WY:[id number]     STUDIES: No results found.  ASSESSMENT: MGUS  PLAN:   1. MGUS: Patient continues to have a mildly elevated M spike of 0.3, but this is unchanged. She also has a mildly increased IgA level. Her kappa lambda light chain ratio is within normal limits. She has no evidence of endorgan damage other than a mild thrombocytopenia. No intervention is needed at this time. Patient does not require bone marrow biopsy. Return to clinic in 6 months with repeat laboratory work and further evaluation. 2. Leukopenia: Chronic and unchanged since approximately April 2010. 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 By report, previous bone marrow biopsy was negative for underlying pathology. 3. 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  Patient expressed understanding and was in agreement with this plan. She also understands that She can call clinic at  any time with  any questions, concerns, or complaints.    Lloyd Huger, MD   03/19/2017 4:23 PM

## 2017-03-17 ENCOUNTER — Other Ambulatory Visit: Payer: Self-pay | Admitting: Gastroenterology

## 2017-03-17 DIAGNOSIS — K7581 Nonalcoholic steatohepatitis (NASH): Secondary | ICD-10-CM

## 2017-03-17 DIAGNOSIS — N289 Disorder of kidney and ureter, unspecified: Secondary | ICD-10-CM | POA: Diagnosis not present

## 2017-03-17 DIAGNOSIS — R945 Abnormal results of liver function studies: Secondary | ICD-10-CM | POA: Diagnosis not present

## 2017-03-18 ENCOUNTER — Inpatient Hospital Stay: Payer: Medicare HMO | Attending: Oncology | Admitting: Oncology

## 2017-03-18 VITALS — BP 148/76 | HR 69 | Temp 98.6°F | Resp 18 | Wt 202.4 lb

## 2017-03-18 DIAGNOSIS — Z79899 Other long term (current) drug therapy: Secondary | ICD-10-CM | POA: Diagnosis not present

## 2017-03-18 DIAGNOSIS — I10 Essential (primary) hypertension: Secondary | ICD-10-CM | POA: Diagnosis not present

## 2017-03-18 DIAGNOSIS — D72819 Decreased white blood cell count, unspecified: Secondary | ICD-10-CM | POA: Insufficient documentation

## 2017-03-18 DIAGNOSIS — R3129 Other microscopic hematuria: Secondary | ICD-10-CM | POA: Diagnosis not present

## 2017-03-18 DIAGNOSIS — I071 Rheumatic tricuspid insufficiency: Secondary | ICD-10-CM | POA: Diagnosis not present

## 2017-03-18 DIAGNOSIS — I340 Nonrheumatic mitral (valve) insufficiency: Secondary | ICD-10-CM

## 2017-03-18 DIAGNOSIS — D696 Thrombocytopenia, unspecified: Secondary | ICD-10-CM | POA: Insufficient documentation

## 2017-03-18 DIAGNOSIS — J8410 Pulmonary fibrosis, unspecified: Secondary | ICD-10-CM | POA: Insufficient documentation

## 2017-03-18 DIAGNOSIS — D472 Monoclonal gammopathy: Secondary | ICD-10-CM | POA: Insufficient documentation

## 2017-03-18 DIAGNOSIS — M129 Arthropathy, unspecified: Secondary | ICD-10-CM | POA: Insufficient documentation

## 2017-03-18 DIAGNOSIS — J449 Chronic obstructive pulmonary disease, unspecified: Secondary | ICD-10-CM | POA: Insufficient documentation

## 2017-03-18 DIAGNOSIS — M8580 Other specified disorders of bone density and structure, unspecified site: Secondary | ICD-10-CM | POA: Insufficient documentation

## 2017-03-18 DIAGNOSIS — M109 Gout, unspecified: Secondary | ICD-10-CM | POA: Diagnosis not present

## 2017-03-18 DIAGNOSIS — R161 Splenomegaly, not elsewhere classified: Secondary | ICD-10-CM | POA: Diagnosis not present

## 2017-03-18 DIAGNOSIS — E785 Hyperlipidemia, unspecified: Secondary | ICD-10-CM | POA: Diagnosis not present

## 2017-03-18 DIAGNOSIS — Z7982 Long term (current) use of aspirin: Secondary | ICD-10-CM | POA: Insufficient documentation

## 2017-03-18 DIAGNOSIS — E039 Hypothyroidism, unspecified: Secondary | ICD-10-CM | POA: Diagnosis not present

## 2017-04-07 ENCOUNTER — Other Ambulatory Visit: Payer: Self-pay | Admitting: Gastroenterology

## 2017-04-07 DIAGNOSIS — N289 Disorder of kidney and ureter, unspecified: Secondary | ICD-10-CM

## 2017-04-14 ENCOUNTER — Ambulatory Visit: Payer: Medicare HMO

## 2017-05-04 DIAGNOSIS — Z23 Encounter for immunization: Secondary | ICD-10-CM | POA: Diagnosis not present

## 2017-05-19 ENCOUNTER — Ambulatory Visit: Payer: Medicare HMO

## 2017-05-24 ENCOUNTER — Ambulatory Visit: Admission: RE | Admit: 2017-05-24 | Payer: Medicare HMO | Source: Ambulatory Visit

## 2017-05-24 ENCOUNTER — Ambulatory Visit: Payer: Medicare HMO

## 2017-06-02 ENCOUNTER — Other Ambulatory Visit: Payer: Self-pay | Admitting: Nurse Practitioner

## 2017-06-02 ENCOUNTER — Ambulatory Visit
Admission: RE | Admit: 2017-06-02 | Discharge: 2017-06-02 | Disposition: A | Discharge status: Home / Self Care | Payer: Medicare HMO | Source: Ambulatory Visit | Attending: Gastroenterology | Admitting: Gastroenterology

## 2017-06-02 DIAGNOSIS — K769 Liver disease, unspecified: Secondary | ICD-10-CM | POA: Diagnosis not present

## 2017-06-02 DIAGNOSIS — Z9049 Acquired absence of other specified parts of digestive tract: Secondary | ICD-10-CM | POA: Diagnosis not present

## 2017-06-02 DIAGNOSIS — K7581 Nonalcoholic steatohepatitis (NASH): Secondary | ICD-10-CM | POA: Diagnosis not present

## 2017-06-02 DIAGNOSIS — R161 Splenomegaly, not elsewhere classified: Secondary | ICD-10-CM | POA: Insufficient documentation

## 2017-06-02 MED ORDER — TIZANIDINE HCL 4 MG PO CAPS: 4.0000 mg | ORAL_CAPSULE | Freq: Two times a day (BID) | ORAL | 3 refills | Status: DC | PRN

## 2017-06-09 ENCOUNTER — Encounter: Payer: Self-pay | Admitting: Nurse Practitioner

## 2017-06-09 ENCOUNTER — Ambulatory Visit: Payer: Medicare HMO | Admitting: Nurse Practitioner

## 2017-06-09 VITALS — BP 133/56 | HR 63 | Resp 16 | Ht 63.0 in | Wt 196.8 lb

## 2017-06-09 DIAGNOSIS — I10 Essential (primary) hypertension: Secondary | ICD-10-CM | POA: Diagnosis not present

## 2017-06-09 DIAGNOSIS — M064 Inflammatory polyarthropathy: Secondary | ICD-10-CM

## 2017-06-09 DIAGNOSIS — E039 Hypothyroidism, unspecified: Secondary | ICD-10-CM | POA: Diagnosis not present

## 2017-06-09 DIAGNOSIS — R42 Dizziness and giddiness: Secondary | ICD-10-CM

## 2017-06-09 DIAGNOSIS — I6523 Occlusion and stenosis of bilateral carotid arteries: Secondary | ICD-10-CM | POA: Diagnosis not present

## 2017-06-09 DIAGNOSIS — I517 Cardiomegaly: Secondary | ICD-10-CM | POA: Diagnosis not present

## 2017-06-09 DIAGNOSIS — R55 Syncope and collapse: Secondary | ICD-10-CM | POA: Diagnosis not present

## 2017-06-09 MED ORDER — OXYCODONE HCL 5 MG PO TABS: 5.0000 mg | ORAL_TABLET | ORAL | 0 refills | Status: DC | PRN

## 2017-06-09 NOTE — Progress Notes (Signed)
Subjective:     Patient ID: Martha Peterson, female   DOB: 11/13/1940, 76 y.o.   MRN: [id number]  Patient is c/o dizziness. This is mostly when she looks up or when she is lying down. Starts when she changes position quickly and passes very quickly. She also reports a lot of post nasal drip. No headache. Feels like she has not been drinking a lot of fluid.  Last visit, she was treated for cellulitis in right lower leg. This appears to have resolved     Current Outpatient Medications:  .  ascorbic acid (VITAMIN C) 500 MG tablet, Take 500 mg by mouth daily., Disp: , Rfl:  .  aspirin EC 81 MG tablet, Take 81 mg by mouth daily., Disp: , Rfl:  .  atorvastatin (LIPITOR) 10 MG tablet, Take 10 mg by mouth daily at 6 PM. , Disp: , Rfl:  .  carvedilol (COREG) 25 MG tablet, Take 12.5 mg by mouth 2 (two) times daily with a meal. , Disp: , Rfl:  .  cholecalciferol (VITAMIN D) 400 UNITS TABS tablet, Take 1,000 Units by mouth., Disp: , Rfl:  .  cloNIDine (CATAPRES) 0.1 MG tablet, Take 0.1 mg by mouth daily., Disp: , Rfl:  .  furosemide (LASIX) 40 MG tablet, Take 40 mg by mouth 2 (two) times daily., Disp: , Rfl:  .  levothyroxine (SYNTHROID, LEVOTHROID) 150 MCG tablet, Take 150 mcg by mouth daily before breakfast. , Disp: , Rfl:  .  losartan-hydrochlorothiazide (HYZAAR) 100-25 MG per tablet, Take 1 tablet by mouth daily., Disp: , Rfl:  .  magnesium oxide (MAG-OX) 400 MG tablet, Take 400 mg by mouth daily. , Disp: , Rfl:  .  meclizine (ANTIVERT) 12.5 MG tablet, Take 12.5 mg by mouth as needed. , Disp: , Rfl:  .  omega-3 acid ethyl esters (LOVAZA) 1 G capsule, Take 1 g by mouth 2 (two) times daily., Disp: , Rfl:  .  omeprazole (PRILOSEC) 20 MG capsule, Take 20 mg by mouth 2 (two) times daily. , Disp: , Rfl:  .  Potassium Gluconate 595 MG CAPS, Take by mouth., Disp: , Rfl:  .  tiZANidine (ZANAFLEX) 4 MG capsule, Take 1 capsule (4 mg total) by mouth 2 (two) times daily as needed (muscle pain)., Disp: 180  capsule, Rfl: 3 .  traMADol (ULTRAM) 50 MG tablet, Take by mouth every 8 (eight) hours as needed., Disp: , Rfl:  .  vitamin E 400 UNIT capsule, Take 400 Units by mouth 2 (two) times daily. , Disp: , Rfl:  .  allopurinol (ZYLOPRIM) 100 MG tablet, , Disp: , Rfl:  .  levocetirizine (XYZAL) 5 MG tablet, Take by mouth., Disp: , Rfl:  .  oxyCODONE (OXY IR/ROXICODONE) 5 MG immediate release tablet, Take 1 tablet (5 mg total) by mouth every 4 (four) hours as needed for severe pain., Disp: 60 tablet, Rfl: 0  Review of Systems  Constitutional: Negative.   HENT: Positive for postnasal drip.   Eyes: Negative.   Respiratory: Negative.   Cardiovascular: Positive for leg swelling.  Gastrointestinal: Negative.   Endocrine:       Good blood sugar control   Genitourinary: Negative.   Musculoskeletal: Positive for arthralgias, joint swelling and myalgias.       The patient has chronic inflammatory arthritis. Generally will take tramadol to help mild to moderate pain.   Skin: Negative.   Allergic/Immunologic: Negative.   Neurological: Positive for dizziness and headaches.  Hematological: Negative.   Psychiatric/Behavioral: Negative.  Today's Vitals   06/09/17 1136  BP: (!) 133/56  Pulse: 63  Resp: 16  SpO2: 97%  Weight: 196 lb 12.8 oz (89.3 kg)  Height: 5\' 3"  (1.6 m)      Objective:   Physical Exam  Constitutional: She is oriented to person, place, and time. She appears well-developed and well-nourished.  HENT:  Head: Normocephalic and atraumatic.  Eyes: Conjunctivae and EOM are normal. Pupils are equal, round, and reactive to light.  Neck: Normal range of motion. Neck supple. JVD present. No tracheal tenderness present. No thyromegaly present.    Cardiovascular: Normal rate, regular rhythm, S1 normal and S2 normal.  Pulses:      Carotid pulses are 2+ on the right side, and 2+ on the left side.      Radial pulses are 2+ on the left side.       Femoral pulses are 2+ on the right side,  and 2+ on the left side.      Popliteal pulses are 2+ on the right side, and 2+ on the left side.       Dorsalis pedis pulses are 1+ on the right side, and 2+ on the left side.       Posterior tibial pulses are 1+ on the right side, and 2+ on the left side.  Pulmonary/Chest: Effort normal and breath sounds normal.  Abdominal: Soft. Normal appearance and bowel sounds are normal. There is no hepatosplenomegaly. There is no tenderness. There is no CVA tenderness.  Musculoskeletal:       Back:       Right foot: There is decreased range of motion, tenderness, bony tenderness and swelling.       Feet:  There is moderate tenderness with 1+edema present in left foot, stretching half-way up th left lower leg. Wearing compression stockings. Tender when palpated. ROM and strength are moderately dominished due to pain.   Neurological: She is alert and oriented to person, place, and time. She displays no tremor. No cranial nerve deficit or sensory deficit. She exhibits normal muscle tone.  Skin: Skin is warm and dry.  Psychiatric: She has a normal mood and affect.  Nursing note and vitals reviewed.      Assessment:     Postural dizziness with near syncope  Carotid artery 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, bilateral - Plan: VAS US CAROTID  Acquired left ventricular hypertrophy - Plan: ECHOCARDIOGRAM COMPLETE  Inflammatory polyarthritis ([place]) - Plan: oxyCODONE (OXY IR/ROXICODONE) 5 MG immediate release tablet  Other secondary osteoarthritis of multiple sites  Essential hypertension     Plan:     1. Postural dizziness with near syncope - intermittent episodes over past few months. Discussed increased hydration with water. Monitor blood pressure.  2. Carotid artery stenosis - will get carotid doppler study for further evaluation 3. LVH - will get echocardiogram for continued evaluation 4. Inflammatory polyarthropathy - tramadol as needed and as prescribed. New rx for oxycodone, writtent for  every 4 to 6 hours as needed for pain. rx for 360 tablets given, however, the patient takes only 1/2 tablet every few days. rx will last several months.  5. htn well controlled - continue bp medication as prescribed  6. Thyroid panel stable. Continue levothyroxine as prescribed.     Follow up 6 weeks to review echo and carotid doppler

## 2017-07-06 ENCOUNTER — Other Ambulatory Visit: Payer: Self-pay | Admitting: Nurse Practitioner

## 2017-07-06 ENCOUNTER — Other Ambulatory Visit: Payer: Self-pay

## 2017-07-06 ENCOUNTER — Telehealth: Payer: Self-pay

## 2017-07-06 DIAGNOSIS — M153 Secondary multiple arthritis: Secondary | ICD-10-CM

## 2017-07-06 NOTE — Telephone Encounter (Signed)
Faxed tramadol pres to [facility] as per heather/np

## 2017-07-06 NOTE — Telephone Encounter (Signed)
-----   Message from Ronnell Freshwater, NP sent at 07/06/2017  2:04 PM EST ----- I cannot esend this to [facility]. Is there local pharmacy  I can use? ----- Message ----- From: Corlis Hove Sent: 07/05/2017   2:40 PM To: Ronnell Freshwater, NP  Can you please send her tramadol to [facility] mail order

## 2017-07-12 ENCOUNTER — Other Ambulatory Visit: Payer: Self-pay | Admitting: Internal Medicine

## 2017-07-19 ENCOUNTER — Ambulatory Visit: Payer: Medicare HMO

## 2017-07-19 DIAGNOSIS — I517 Cardiomegaly: Secondary | ICD-10-CM | POA: Diagnosis not present

## 2017-07-26 ENCOUNTER — Other Ambulatory Visit: Payer: Self-pay

## 2017-07-28 ENCOUNTER — Other Ambulatory Visit: Payer: Self-pay

## 2017-07-28 MED ORDER — LEVOTHYROXINE SODIUM 150 MCG PO TABS: 150.0000 ug | ORAL_TABLET | Freq: Every day | ORAL | 3 refills | Status: DC

## 2017-08-02 ENCOUNTER — Other Ambulatory Visit: Payer: Self-pay

## 2017-08-04 ENCOUNTER — Other Ambulatory Visit: Payer: Self-pay

## 2017-08-04 MED ORDER — LEVOTHYROXINE SODIUM 150 MCG PO TABS: 150.0000 ug | ORAL_TABLET | Freq: Every day | ORAL | 3 refills | Status: DC

## 2017-08-05 ENCOUNTER — Other Ambulatory Visit: Payer: Self-pay

## 2017-08-05 MED ORDER — POTASSIUM CHLORIDE ER 10 MEQ PO TBCR: 10.0000 meq | EXTENDED_RELEASE_TABLET | Freq: Every day | ORAL | 1 refills | Status: DC

## 2017-08-05 MED ORDER — FUROSEMIDE 40 MG PO TABS: 40.0000 mg | ORAL_TABLET | Freq: Two times a day (BID) | ORAL | 1 refills | Status: DC

## 2017-08-05 MED ORDER — OMEPRAZOLE 20 MG PO CPDR: 20.0000 mg | DELAYED_RELEASE_CAPSULE | Freq: Two times a day (BID) | ORAL | 1 refills | Status: DC

## 2017-08-09 ENCOUNTER — Ambulatory Visit: Payer: Medicare HMO

## 2017-08-09 DIAGNOSIS — I6523 Occlusion and stenosis of bilateral carotid arteries: Secondary | ICD-10-CM | POA: Diagnosis not present

## 2017-08-10 ENCOUNTER — Ambulatory Visit: Payer: Self-pay | Admitting: Nurse Practitioner

## 2017-08-13 DIAGNOSIS — Z1231 Encounter for screening mammogram for malignant neoplasm of breast: Secondary | ICD-10-CM | POA: Diagnosis not present

## 2017-08-24 ENCOUNTER — Ambulatory Visit: Payer: Self-pay | Admitting: Nurse Practitioner

## 2017-08-30 ENCOUNTER — Other Ambulatory Visit: Payer: Self-pay | Admitting: Nurse Practitioner

## 2017-08-30 DIAGNOSIS — I6523 Occlusion and stenosis of bilateral carotid arteries: Secondary | ICD-10-CM

## 2017-08-30 NOTE — Procedures (Signed)
[place], [place] [address]  DATE OF SERVICE:  August 09, 2017  CAROTID DOPPLER INTERPRETATION:  Bilateral Carotid Ultrsasound and Color Doppler Examination was performed. The RIGHT CCA shows  mild plaque in the vessel. The LEFT CCA shows  mild plaque in the vessel. There was  no intimal thickening noted in the RIGHT carotid artery. There was  no intimal thickening in the LEFT carotid artery.  The RIGHT CCA shows peak systolic velocity of  81EH per second. The end diastolic velocity is  63JS per second on the RIGHT side. The RIGHT ICA shows peak systolic velocity of  62 per second. RIGHT sided ICA end diastolic velocity is  97WY per second. The RIGHT ECA shows a peak systolic velocity of  63ZC per second. The ICA/CCA ratio is calculated to be  0.76. This suggests  Less than 50% stenosis. The Vertebral Artery shows  antegrade flow.  The LEFT CCA shows peak systolic velocity of  58IF per second. The end diastolic velocity is  02DX per second on the LEFT side. The LEFT ICA shows peak systolic velocity of  412INO second. LEFT sided ICA end diastolic velocity is  67EH per second. The LEFT ECA shows a peak systolic velocity of  20NO per second. The ICA/CCA ratio is calculated to be  1.55. This suggests  50-60% stenosis. The Vertebral Artery shows  antegrade flow.   Impression:    The RIGHT CAROTID shows  Less than 50% stenosis. The LEFT CAROTID shows  50-60% stenosis.  There is  mild plaque formation noted on the LEFT and  mild on the RIGHT  side. Consider a repeat Carotid doppler if clinical situation and symptoms warrant in 6-12 months. Patient should be encouraged to change lifestyles such as smoking cessation, regular exercise and dietary modification. Use of statins in the right clinical setting and ASA is encouraged.  Allyne Gee, MD [facility] Pulmonary Critical Care Medicine

## 2017-09-16 ENCOUNTER — Inpatient Hospital Stay: Payer: Medicare HMO | Attending: Oncology

## 2017-09-16 DIAGNOSIS — D72819 Decreased white blood cell count, unspecified: Secondary | ICD-10-CM | POA: Insufficient documentation

## 2017-09-16 DIAGNOSIS — Z7982 Long term (current) use of aspirin: Secondary | ICD-10-CM | POA: Diagnosis not present

## 2017-09-16 DIAGNOSIS — Z79899 Other long term (current) drug therapy: Secondary | ICD-10-CM | POA: Insufficient documentation

## 2017-09-16 DIAGNOSIS — D472 Monoclonal gammopathy: Secondary | ICD-10-CM | POA: Diagnosis not present

## 2017-09-16 DIAGNOSIS — D696 Thrombocytopenia, unspecified: Secondary | ICD-10-CM | POA: Insufficient documentation

## 2017-09-16 DIAGNOSIS — Z87891 Personal history of nicotine dependence: Secondary | ICD-10-CM | POA: Diagnosis not present

## 2017-09-16 LAB — BASIC METABOLIC PANEL
Anion gap: 10 (ref 5–15)
BUN: 20 mg/dL (ref 6–20)
CALCIUM: 9.6 mg/dL (ref 8.9–10.3)
CHLORIDE: 95 mmol/L — AB (ref 101–111)
CO2: 28 mmol/L (ref 22–32)
CREATININE: 0.72 mg/dL (ref 0.44–1.00)
GFR calc Af Amer: >60 mL/min (ref >60)
GFR calc non Af Amer: >60 mL/min (ref >60)
Glucose, Bld: 111 mg/dL — ABNORMAL HIGH (ref 65–99)
Potassium: 3.5 mmol/L (ref 3.5–5.1)
SODIUM: 133 mmol/L — AB (ref 135–145)

## 2017-09-16 LAB — CBC WITH DIFFERENTIAL/PLATELET
Basophils Absolute: 0 10*3/uL (ref 0–0.1)
Basophils Relative: 1 %
EOS ABS: 0.1 10*3/uL (ref 0–0.7)
Eosinophils Relative: 3 %
HEMATOCRIT: 38 % (ref 35.0–47.0)
HEMOGLOBIN: 13.5 g/dL (ref 12.0–16.0)
LYMPHS ABS: 0.9 10*3/uL — AB (ref 1.0–3.6)
Lymphocytes Relative: 28 %
MCH: 33.2 pg (ref 26.0–34.0)
MCHC: 35.4 g/dL (ref 32.0–36.0)
MCV: 93.9 fL (ref 80.0–100.0)
MONO ABS: 0.3 10*3/uL (ref 0.2–0.9)
Monocytes Relative: 10 %
NEUTROS PCT: 58 %
Neutro Abs: 1.9 10*3/uL (ref 1.4–6.5)
Platelets: 129 10*3/uL — ABNORMAL LOW (ref 150–440)
RBC: 4.05 MIL/uL (ref 3.80–5.20)
RDW: 12.3 % (ref 11.5–14.5)
WBC: 3.3 10*3/uL — ABNORMAL LOW (ref 3.6–11.0)

## 2017-09-17 LAB — PROTEIN ELECTROPHORESIS, SERUM
A/G RATIO SPE: 1.1 (ref 0.7–1.7)
ALBUMIN ELP: 3.8 g/dL (ref 2.9–4.4)
ALPHA-1-GLOBULIN: 0.2 g/dL (ref 0.0–0.4)
Alpha-2-Globulin: 0.7 g/dL (ref 0.4–1.0)
Beta Globulin: 1 g/dL (ref 0.7–1.3)
GAMMA GLOBULIN: 1.7 g/dL (ref 0.4–1.8)
Globulin, Total: 3.5 g/dL (ref 2.2–3.9)
M-Spike, %: 0.4 g/dL — ABNORMAL HIGH
TOTAL PROTEIN ELP: 7.3 g/dL (ref 6.0–8.5)

## 2017-09-17 LAB — IGG, IGA, IGM
IGA: 502 mg/dL — AB (ref 64–422)
IGG (IMMUNOGLOBIN G), SERUM: 1454 mg/dL (ref 700–1600)
IgM (Immunoglobulin M), Srm: 76 mg/dL (ref 26–217)

## 2017-09-17 LAB — KAPPA/LAMBDA LIGHT CHAINS
KAPPA FREE LGHT CHN: 36.6 mg/L — AB (ref 3.3–19.4)
KAPPA, LAMDA LIGHT CHAIN RATIO: 1.45 (ref 0.26–1.65)
Lambda free light chains: 25.2 mg/L (ref 5.7–26.3)

## 2017-09-19 NOTE — Progress Notes (Signed)
[place]  [phone]  [phone]     Martha Peterson DOB: 12/09/1940  MR#: [id number]  PJK#:[id number]  Patient Care Team: [name], MD as PCP - General (Internal Medicine)  CHIEF COMPLAINT: MGUS  INTERVAL HISTORY: Patient returns to clinic today for repeat laboratory work and routine six-month evaluation.  She continues to feel well and remains asymptomatic.  She has no neurologic complaints. She denies any recent fevers or illnesses. She has a good appetite and denies weight loss. She has no chest pain or shortness of breath. She denies any nausea, vomiting, constipation, or diarrhea. She has no urinary complaints.  She denies any bony pain.  Patient offers no specific complaints today.    REVIEW OF SYSTEMS:   Review of Systems  Constitutional: Negative.  Negative for fever, malaise/fatigue and weight loss.  Respiratory: Negative.  Negative for cough and shortness of breath.   Cardiovascular: Negative.  Negative for chest pain and leg swelling.  Gastrointestinal: Negative.  Negative for abdominal pain and constipation.  Genitourinary: Negative.  Negative for dysuria.  Musculoskeletal: Negative.  Negative for back pain.  Neurological: Negative.  Negative for sensory change, focal weakness and weakness.  Endo/Heme/Allergies: Does not bruise/bleed easily.  Psychiatric/Behavioral: Negative.  The patient is not nervous/anxious.    As per HPI. Otherwise, a complete review of systems is negative.   PAST MEDICAL HISTORY: Past Medical History:  Diagnosis Date  . Arthritis   . COPD (chronic obstructive pulmonary disease) ([place])   . Diverticulitis   . Gout   . Hyperlipidemia   . Hypertension   . Hypothyroidism   . 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 Microhematuria   . Mild mitral regurgitation   . Mild tricuspid regurgitation   . Osteopenia   . Pulmonary fibrosis ([place])   . Urinary incontinence     PAST  SURGICAL HISTORY: Past Surgical History:  Procedure Laterality Date  . ABDOMINAL HYSTERECTOMY    . APPENDECTOMY    . CHOLECYSTECTOMY    . COLONOSCOPY WITH PROPOFOL N/A 12/04/2014   Procedure: COLONOSCOPY WITH PROPOFOL;  Surgeon: Lollie Sails, MD;  Location: [facility] ENDOSCOPY;  Service: Endoscopy;  Laterality: N/A;  . FRACTURE SURGERY     Femur Fx; LT Ankle Pinning  . JOINT REPLACEMENT     RT TKR  . Patial Thyroid Resection      FAMILY HISTORY: Reviewed and unchanged. No reported history of malignancy or chronic disease.   GYNECOLOGIC HISTORY:  No LMP recorded. Patient has had a hysterectomy.     ADVANCED DIRECTIVES:    HEALTH MAINTENANCE: Social History   Tobacco Use  . Smoking status: Former Smoker    Types: Cigarettes  . Smokeless tobacco: Never Used  Substance Use Topics  . Alcohol use: No  . Drug use: No       Allergies  Allergen Reactions  . Codeine   . Levaquin [Levofloxacin]     Current Outpatient Medications  Medication Sig Dispense Refill  . allopurinol (ZYLOPRIM) 100 MG tablet     . ascorbic acid (VITAMIN C) 500 MG tablet Take 500 mg by mouth daily.    Marland Kitchen aspirin EC 81 MG tablet Take 81 mg by mouth daily.    . carvedilol (COREG) 25 MG tablet Take 12.5 mg by mouth 2 (two) times daily with a meal.     . cholecalciferol (VITAMIN D) 400 UNITS TABS tablet Take 1,000 Units by mouth.    . cloNIDine (CATAPRES) 0.1  MG tablet Take 0.1 mg by mouth daily.    . furosemide (LASIX) 40 MG tablet Take 1 tablet (40 mg total) by mouth 2 (two) times daily. 180 tablet 1  . levocetirizine (XYZAL) 5 MG tablet TAKE 1 TABLET EVERY DAY  FOR  ALLERGIES 90 tablet 1  . levothyroxine (SYNTHROID, LEVOTHROID) 150 MCG tablet Take 1 tablet (150 mcg total) by mouth daily before breakfast. 90 tablet 3  . losartan-hydrochlorothiazide (HYZAAR) 100-25 MG per tablet Take 1 tablet by mouth daily.    . magnesium oxide (MAG-OX) 400 MG tablet Take 400 mg by mouth daily.     . meclizine  (ANTIVERT) 12.5 MG tablet Take 12.5 mg by mouth as needed.     Marland Kitchen omega-3 acid ethyl esters (LOVAZA) 1 G capsule Take 1 g by mouth 2 (two) times daily.    Marland Kitchen omeprazole (PRILOSEC) 20 MG capsule Take 1 capsule (20 mg total) by mouth 2 (two) times daily. 180 capsule 1  . oxyCODONE (OXY IR/ROXICODONE) 5 MG immediate release tablet Take 1 tablet (5 mg total) by mouth every 4 (four) hours as needed for severe pain. 30 tablet 0  . potassium chloride (K-DUR) 10 MEQ tablet Take 1 tablet (10 mEq total) by mouth daily. 90 tablet 1  . Potassium Gluconate 595 MG CAPS Take by mouth.    . traMADol (ULTRAM) 50 MG tablet Take by mouth every 8 (eight) hours as needed.    . vitamin E 400 UNIT capsule Take 400 Units by mouth 2 (two) times daily.     . Vitamin E 400 units TABS     . atorvastatin (LIPITOR) 10 MG tablet Take 1 tablet (10 mg total) by mouth daily at 6 PM. 90 tablet 2   No current facility-administered medications for this visit.     OBJECTIVE: BP 133/62 (BP Location: Left Arm, Patient Position: Sitting)   Pulse (!) 57   Temp (!) 97.4 F (36.3 C) (Tympanic)   Resp 12   Ht _0  (1.626 m)   Wt 200 lb 11.2 oz (91 kg)   BMI 34.45 kg/m    Body mass index is 34.45 kg/m.    ECOG FS:0 - Asymptomatic  General: Well-developed, well-nourished, no acute distress. Eyes: Pink conjunctiva, anicteric sclera. Lungs: Clear to auscultation bilaterally. Heart: Regular rate and rhythm. No rubs, murmurs, or gallops. Abdomen: Soft, nontender, nondistended. No organomegaly noted, normoactive bowel sounds. Musculoskeletal: No edema, cyanosis, or clubbing. Neuro: Alert, answering all questions appropriately. Cranial nerves grossly intact. Skin: No rashes or petechiae noted. Psych: Normal affect.   LAB RESULTS:  No visits with results within 3 Day(s) from this visit.  Latest known visit with results is:  Appointment on 09/16/2017  Component Date Value Ref Range Status  . Total Protein ELP 09/16/2017 7.3  6.0  - 8.5 g/dL Final  . Albumin ELP 09/16/2017 3.8  2.9 - 4.4 g/dL Final  . Alpha-1-Globulin 09/16/2017 0.2  0.0 - 0.4 g/dL Final  . Alpha-2-Globulin 09/16/2017 0.7  0.4 - 1.0 g/dL Final  . Beta Globulin 09/16/2017 1.0  0.7 - 1.3 g/dL Final  . Gamma Globulin 09/16/2017 1.7  0.4 - 1.8 g/dL Final  . M-Spike, % 09/16/2017 0.4* Not Observed g/dL Final  . SPE Interp. 09/16/2017 Comment   Final   Comment: (NOTE) The SPE pattern demonstrates a single peak (M-spike) in the gamma region which may represent monoclonal protein. This peak may also be caused by circulating immune complexes, cryoglobulins, C-reactive protein, fibrinogen or hemolysis.  If clinically indicated, the presence of a monoclonal gammopathy may be confirmed by immuno- fixation, as well as an evaluation of the urine for the presence of Bence-Jones protein. Performed At: [facility] [place], [place] [id number] Rush Farmer MD [id number]   . Comment 09/16/2017 Comment   Final   Comment: (NOTE) Protein electrophoresis scan will follow via computer, mail, or courier delivery.   Marland Kitchen GLOBULIN, TOTAL 09/16/2017 3.5  2.2 - 3.9 g/dL Corrected  . A/G Ratio 09/16/2017 1.1  0.7 - 1.7 Corrected   Performed at [facility], [address] [place], [place] [address]  . WBC 09/16/2017 3.3* 3.6 - 11.0 K/uL Final  . RBC 09/16/2017 4.05  3.80 - 5.20 MIL/uL Final  . Hemoglobin 09/16/2017 13.5  12.0 - 16.0 g/dL Final  . HCT 09/16/2017 38.0  35.0 - 47.0 % Final  . MCV 09/16/2017 93.9  80.0 - 100.0 fL Final  . MCH 09/16/2017 33.2  26.0 - 34.0 pg Final  . MCHC 09/16/2017 35.4  32.0 - 36.0 g/dL Final  . RDW 09/16/2017 12.3  11.5 - 14.5 % Final  . Platelets 09/16/2017 129* 150 - 440 K/uL Final  . Neutrophils Relative % 09/16/2017 58  % Final  . Neutro Abs 09/16/2017 1.9  1.4 - 6.5 K/uL Final  . Lymphocytes Relative 09/16/2017 28  % Final  . Lymphs Abs 09/16/2017 0.9* 1.0 - 3.6 K/uL Final  . Monocytes Relative  09/16/2017 10  % Final  . Monocytes Absolute 09/16/2017 0.3  0.2 - 0.9 K/uL Final  . Eosinophils Relative 09/16/2017 3  % Final  . Eosinophils Absolute 09/16/2017 0.1  0 - 0.7 K/uL Final  . Basophils Relative 09/16/2017 1  % Final  . Basophils Absolute 09/16/2017 0.0  0 - 0.1 K/uL Final   Performed at [facility], [address], [place], [place] [address]  . Sodium 09/16/2017 133* 135 - 145 mmol/L Final  . Potassium 09/16/2017 3.5  3.5 - 5.1 mmol/L Final  . Chloride 09/16/2017 95* 101 - 111 mmol/L Final  . CO2 09/16/2017 28  22 - 32 mmol/L Final  . Glucose, Bld 09/16/2017 111* 65 - 99 mg/dL Final  . BUN 09/16/2017 20  6 - 20 mg/dL Final  . Creatinine, Ser 09/16/2017 0.72  0.44 - 1.00 mg/dL Final  . Calcium 09/16/2017 9.6  8.9 - 10.3 mg/dL Final  . GFR calc non Af Amer 09/16/2017 >60  >60 mL/min Final  . GFR calc Af Amer 09/16/2017 >60  >60 mL/min Final   Comment: (NOTE) The eGFR has been calculated using the CKD EPI equation. This calculation has not been validated in all clinical situations. eGFR's persistently <60 mL/min signify possible Chronic Kidney Disease.   Georgiann Hahn gap 09/16/2017 10  5 - 15 Final   Performed at [facility], [address] [place], [place] [address]  . Kappa free light chain 09/16/2017 36.6* 3.3 - 19.4 mg/L Final  . Lamda free light chains 09/16/2017 25.2  5.7 - 26.3 mg/L Final  . Kappa, lamda light chain ratio 09/16/2017 1.45  0.26 - 1.65 Final   Comment: (NOTE) Performed At: [facility] [place], [place] [id number] Rush Farmer MD [phone] Performed at [facility], [address], [place], [place] [address]   . IgG (Immunoglobin G), Serum 09/16/2017 1,454  700 - 1,600 mg/dL Final  . IgA 09/16/2017 502* 64 - 422 mg/dL Final  . IgM (Immunoglobulin M), Srm 09/16/2017 76  26 - 217 mg/dL Final   Comment: (NOTE) Performed At: [facility] [place], [place] [id number] Rush Farmer MD [id number] Performed at [facility], [address] [place], [place] [address]     STUDIES: No results found.  ASSESSMENT: MGUS  PLAN:   1. MGUS: Patient continues to have a mildly elevated M spike of 0.4, but this is unchanged since at least March 2017. She also has a mildly increased IgA level. Her kappa lambda light chain ratio is within normal limits. She has no evidence of endorgan damage other than a mild thrombocytopenia which is also unchanged. No intervention is needed at this time. Patient does not require bone marrow biopsy. Return to clinic in 6 months with repeat laboratory work only and then in 1 year with repeat laboratory work and further evaluation.  2. Leukopenia: Chronic and unchanged since approximately April 2010. 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 By report, previous bone marrow biopsy was negative for underlying pathology, but we do not have these results. 3. 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  Approximately 20 minutes was spent in discussion of which greater than 50% in consultation.  Patient expressed understanding and was in agreement with this plan. She also understands that She can call clinic at any time with any questions, concerns, or complaints.    Lloyd Huger, MD   09/25/2017 9:49 AM

## 2017-09-20 ENCOUNTER — Encounter: Payer: Self-pay | Admitting: Nurse Practitioner

## 2017-09-20 ENCOUNTER — Ambulatory Visit (INDEPENDENT_AMBULATORY_CARE_PROVIDER_SITE_OTHER): Payer: Medicare HMO | Admitting: Nurse Practitioner

## 2017-09-20 VITALS — BP 130/70 | HR 62 | Resp 16 | Ht 64.0 in | Wt 204.0 lb

## 2017-09-20 DIAGNOSIS — M064 Inflammatory polyarthropathy: Secondary | ICD-10-CM

## 2017-09-20 DIAGNOSIS — I10 Essential (primary) hypertension: Secondary | ICD-10-CM | POA: Diagnosis not present

## 2017-09-20 DIAGNOSIS — I517 Cardiomegaly: Secondary | ICD-10-CM

## 2017-09-20 DIAGNOSIS — I6523 Occlusion and stenosis of bilateral carotid arteries: Secondary | ICD-10-CM | POA: Diagnosis not present

## 2017-09-20 DIAGNOSIS — Z23 Encounter for immunization: Secondary | ICD-10-CM | POA: Diagnosis not present

## 2017-09-20 DIAGNOSIS — R42 Dizziness and giddiness: Secondary | ICD-10-CM | POA: Diagnosis not present

## 2017-09-20 MED ORDER — OXYCODONE HCL 5 MG PO TABS: 5.0000 mg | ORAL_TABLET | ORAL | 0 refills | Status: DC | PRN

## 2017-09-20 NOTE — Progress Notes (Signed)
[facility] [place], [place] [address]  Internal MEDICINE  Office Visit Note  Patient Name: Martha Peterson  [id number]  [id number]  Date of Service: 09/29/2017  Chief Complaint  Patient presents with  . Follow-up    Echo    Patient is here for routine follow up visit. Today, She continues to have some dizziness, mostly when she looks up or when she is lying down. Starts when she changes position and passes very quickly. She also reports a lot of post nasal drip. No headache. She has had echocardiogram since her last visit and we will review results.    Pt is here for routine follow up.    Current Medication: Outpatient Encounter Medications as of 09/20/2017  Medication Sig Note  . allopurinol (ZYLOPRIM) 100 MG tablet    . ascorbic acid (VITAMIN C) 500 MG tablet Take 500 mg by mouth daily.   Marland Kitchen aspirin EC 81 MG tablet Take 81 mg by mouth daily.   . carvedilol (COREG) 25 MG tablet Take 12.5 mg by mouth 2 (two) times daily with a meal.  08/20/2015: Received from: External Pharmacy  . cholecalciferol (VITAMIN D) 400 UNITS TABS tablet Take 1,000 Units by mouth.   . cloNIDine (CATAPRES) 0.1 MG tablet Take 0.1 mg by mouth daily.   . furosemide (LASIX) 40 MG tablet Take 1 tablet (40 mg total) by mouth 2 (two) times daily.   Marland Kitchen levocetirizine (XYZAL) 5 MG tablet TAKE 1 TABLET EVERY DAY  FOR  ALLERGIES   . levothyroxine (SYNTHROID, LEVOTHROID) 150 MCG tablet Take 1 tablet (150 mcg total) by mouth daily before breakfast.   . losartan-hydrochlorothiazide (HYZAAR) 100-25 MG per tablet Take 1 tablet by mouth daily.   . magnesium oxide (MAG-OX) 400 MG tablet Take 400 mg by mouth daily.  08/20/2015: Received from: External Pharmacy  . meclizine (ANTIVERT) 12.5 MG tablet Take 12.5 mg by mouth as needed.    Marland Kitchen omega-3 acid ethyl esters (LOVAZA) 1 G capsule Take 1 g by mouth 2 (two) times daily.   Marland Kitchen omeprazole (PRILOSEC) 20 MG capsule Take 1 capsule (20 mg total) by mouth 2  (two) times daily.   Marland Kitchen oxyCODONE (OXY IR/ROXICODONE) 5 MG immediate release tablet Take 1 tablet (5 mg total) by mouth every 4 (four) hours as needed for severe pain.   . potassium chloride (K-DUR) 10 MEQ tablet Take 1 tablet (10 mEq total) by mouth daily.   . Potassium Gluconate 595 MG CAPS Take by mouth.   . traMADol (ULTRAM) 50 MG tablet Take by mouth every 8 (eight) hours as needed.   . vitamin E 400 UNIT capsule Take 400 Units by mouth 2 (two) times daily.  08/20/2015: Received from: External Pharmacy  . Vitamin E 400 units TABS    . [DISCONTINUED] atorvastatin (LIPITOR) 10 MG tablet Take 10 mg by mouth daily at 6 PM.  08/20/2015: Received from: External Pharmacy  . [DISCONTINUED] oxyCODONE (OXY IR/ROXICODONE) 5 MG immediate release tablet Take 1 tablet (5 mg total) by mouth every 4 (four) hours as needed for severe pain.    No facility-administered encounter medications on file as of 09/20/2017.     Surgical History: Past Surgical History:  Procedure Laterality Date  . ABDOMINAL HYSTERECTOMY    . APPENDECTOMY    . CHOLECYSTECTOMY    . COLONOSCOPY WITH PROPOFOL N/A 12/04/2014   Procedure: COLONOSCOPY WITH PROPOFOL;  Surgeon: Lollie Sails, MD;  Location: [facility] ENDOSCOPY;  Service: Endoscopy;  Laterality:  N/A;  . FRACTURE SURGERY     Femur Fx; LT Ankle Pinning  . JOINT REPLACEMENT     RT TKR  . Patial Thyroid Resection      Medical History: Past Medical History:  Diagnosis Date  . Arthritis   . COPD (chronic obstructive pulmonary disease) ([place])   . Diverticulitis   . Gout   . Hyperlipidemia   . Hypertension   . Hypothyroidism   . 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 Microhematuria   . Mild mitral regurgitation   . Mild tricuspid regurgitation   . Osteopenia   . Pulmonary fibrosis ([place])   . Urinary incontinence     Family History: Family History  Problem Relation Age of Onset  . Heart Problems Mother   . Heart Problems Father   . Diabetes  Father   . Lupus Sister     Social History   Socioeconomic History  . Marital status: Married    Spouse name: Not on file  . Number of children: Not on file  . Years of education: Not on file  . Highest education level: Not on file  Occupational History  . Not on file  Social Needs  . Financial resource strain: Not on file  . Food insecurity:    Worry: Not on file    Inability: Not on file  . Transportation needs:    Medical: Not on file    Non-medical: Not on file  Tobacco Use  . Smoking status: Former Smoker    Types: Cigarettes  . Smokeless tobacco: Never Used  Substance and Sexual Activity  . Alcohol use: No  . Drug use: No  . Sexual activity: Not on file  Lifestyle  . Physical activity:    Days per week: Not on file    Minutes per session: Not on file  . Stress: Not on file  Relationships  . Social connections:    Talks on phone: Not on file    Gets together: Not on file    Attends religious service: Not on file    Active member of club or organization: Not on file    Attends meetings of clubs or organizations: Not on file    Relationship status: Not on file  . Intimate partner violence:    Fear of current or ex partner: Not on file    Emotionally abused: Not on file    Physically abused: Not on file    Forced sexual activity: Not on file  Other Topics Concern  . Not on file  Social History Narrative  . Not on file      Review of Systems  Constitutional: Positive for fatigue. Negative for activity change, chills and unexpected weight change.  HENT: Positive for congestion and postnasal drip. Negative for sinus pressure, sinus pain and sore throat.   Eyes: Negative.   Respiratory: Negative for cough, shortness of breath and wheezing.   Cardiovascular: Positive for leg swelling. Negative for chest pain and palpitations.  Gastrointestinal: Negative for constipation, diarrhea, nausea and vomiting.  Endocrine:       Good blood sugar control     Genitourinary: Negative.   Musculoskeletal: Positive for arthralgias, joint swelling and myalgias.       The patient has chronic inflammatory arthritis. Generally will take tramadol to help mild to moderate pain.   Skin: Negative for color change and rash.  Allergic/Immunologic: Positive for environmental allergies.  Neurological: Positive for dizziness and headaches.  Hematological: Negative for  adenopathy. Does not bruise/bleed easily.  Psychiatric/Behavioral: Negative.     Today's Vitals   09/20/17 1233  BP: 130/70  Pulse: 62  Resp: 16  SpO2: 96%  Weight: 204 lb (92.5 kg)  Height: 5\' 4"  (1.626 m)    Physical Exam  Constitutional: She is oriented to person, place, and time. She appears well-developed and well-nourished.  HENT:  Head: Normocephalic and atraumatic.  Eyes: Pupils are equal, round, and reactive to light. Conjunctivae and EOM are normal.  Neck: Normal range of motion. Neck supple. JVD present. No tracheal tenderness present. Carotid bruit is not present. No thyromegaly present.    Cardiovascular: Normal rate, regular rhythm, S1 normal and S2 normal.  Pulses:      Carotid pulses are 2+ on the right side, and 2+ on the left side.      Radial pulses are 2+ on the left side.       Femoral pulses are 2+ on the right side, and 2+ on the left side.      Popliteal pulses are 2+ on the right side, and 2+ on the left side.       Dorsalis pedis pulses are 1+ on the right side, and 2+ on the left side.       Posterior tibial pulses are 1+ on the right side, and 2+ on the left side.  swellin present in bilateral lower extremities and ankle. Left greater than right.   Pulmonary/Chest: Effort normal and breath sounds normal. She has no wheezes.  Abdominal: Soft. Normal appearance and bowel sounds are normal. There is no hepatosplenomegaly. There is no tenderness. There is no CVA tenderness.  Musculoskeletal:       Back:       Right foot: There is decreased range of motion,  tenderness, bony tenderness and swelling.       Feet:  There is moderate tenderness with 1+edema present in left foot, stretching half-way up th left lower leg. Wearing compression stockings. Tender when palpated. ROM and strength are moderately dominished due to pain.   Neurological: She is alert and oriented to person, place, and time. She displays no tremor. No cranial nerve deficit or sensory deficit. She exhibits normal muscle tone.  Skin: Skin is warm and dry.  Psychiatric: She has a normal mood and affect. Her behavior is normal. Judgment and thought content normal.  Nursing note and vitals reviewed.   Assessment/Plan: 1. Postural dizziness This has improved. Review results of echo and carotid doppler today.   2. 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 There is mild plaque in both carotid arteries. There is <50% stenosis on right side and 50-60% stenosis on left side. Continue cholesterol medication as prescribed. Repeat study I napproximately one year.   3. Left ventricular hypertrophy Echocardiogram showing mild to moderate LVH with normal LVF. There is mild miltral ad tricuspid regurgitation. Continue to control bp. Repeat study in one year.   4. Essential hypertension Well controlled. Continue bp medication as prescribed .  5. Inflammatory polyarthritis ([place]) Patient taking oxycodone 5mg  po rarely. New prescription for #30 tablets sent to pharmacy today.  - oxyCODONE (OXY IR/ROXICODONE) 5 MG immediate release tablet; Take 1 tablet (5 mg total) by mouth every 4 (four) hours as needed for severe pain.  Dispense: 30 tablet; Refill: 0  6. Need for vaccination for Strep pneumoniae - Pneumococcal conjugate vaccine 13-valent IM   General Counseling: Damonica verbalizes understanding of the findings of todays visit  and agrees with plan of treatment. I have discussed any further diagnostic evaluation that may be needed or  ordered today. We also reviewed her medications today. she has been encouraged to call the office with any questions or concerns that should arise related to todays visit.   Reviewed risks and possible side effects associated with taking opiates and benzodiazepines. Combination of these could cause dizziness and drowsiness. Advised him not to drive or operate machinery when taking these medications, as he could put his life and the lives of others at risk. He voiced understanding.   This patient was seen by [name], FNP- C in Collaboration with Dr [name] as a part of collaborative care agreement    Orders Placed This Encounter  Procedures  . Pneumococcal conjugate vaccine 13-valent IM    Meds ordered this encounter  Medications  . oxyCODONE (OXY IR/ROXICODONE) 5 MG immediate release tablet    Sig: Take 1 tablet (5 mg total) by mouth every 4 (four) hours as needed for severe pain.    Dispense:  30 tablet    Refill:  0    Order Specific Question:   Supervising Provider    Answer:   [name] [[id number]]    Time spent: 78  Minutes    Dr [name] Internal medicine

## 2017-09-23 ENCOUNTER — Other Ambulatory Visit: Payer: Self-pay

## 2017-09-23 ENCOUNTER — Inpatient Hospital Stay (HOSPITAL_BASED_OUTPATIENT_CLINIC_OR_DEPARTMENT_OTHER): Payer: Medicare HMO | Admitting: Oncology

## 2017-09-23 ENCOUNTER — Encounter: Payer: Self-pay | Admitting: Oncology

## 2017-09-23 ENCOUNTER — Other Ambulatory Visit: Payer: Self-pay | Admitting: Nurse Practitioner

## 2017-09-23 VITALS — BP 133/62 | HR 57 | Temp 97.4°F | Resp 12 | Ht 64.0 in | Wt 200.7 lb

## 2017-09-23 DIAGNOSIS — Z7982 Long term (current) use of aspirin: Secondary | ICD-10-CM | POA: Diagnosis not present

## 2017-09-23 DIAGNOSIS — D472 Monoclonal gammopathy: Secondary | ICD-10-CM | POA: Diagnosis not present

## 2017-09-23 DIAGNOSIS — D72819 Decreased white blood cell count, unspecified: Secondary | ICD-10-CM

## 2017-09-23 DIAGNOSIS — Z87891 Personal history of nicotine dependence: Secondary | ICD-10-CM

## 2017-09-23 DIAGNOSIS — Z79899 Other long term (current) drug therapy: Secondary | ICD-10-CM

## 2017-09-23 DIAGNOSIS — D696 Thrombocytopenia, unspecified: Secondary | ICD-10-CM

## 2017-09-23 MED ORDER — ATORVASTATIN CALCIUM 10 MG PO TABS: 10.0000 mg | ORAL_TABLET | Freq: Every day | ORAL | 2 refills | Status: DC

## 2017-09-23 NOTE — Progress Notes (Signed)
Patient here for follow up no changes since last appt 

## 2017-09-29 DIAGNOSIS — Z23 Encounter for immunization: Secondary | ICD-10-CM | POA: Insufficient documentation

## 2017-09-29 DIAGNOSIS — R42 Dizziness and giddiness: Secondary | ICD-10-CM | POA: Insufficient documentation

## 2017-09-29 DIAGNOSIS — M064 Inflammatory polyarthropathy: Secondary | ICD-10-CM | POA: Insufficient documentation

## 2017-09-29 DIAGNOSIS — I6523 Occlusion and stenosis of bilateral carotid arteries: Secondary | ICD-10-CM | POA: Insufficient documentation

## 2017-09-29 DIAGNOSIS — I10 Essential (primary) hypertension: Secondary | ICD-10-CM | POA: Insufficient documentation

## 2017-09-29 DIAGNOSIS — I517 Cardiomegaly: Secondary | ICD-10-CM | POA: Insufficient documentation

## 2017-10-04 ENCOUNTER — Other Ambulatory Visit: Payer: Self-pay

## 2017-10-04 MED ORDER — CARVEDILOL 25 MG PO TABS: 12.5000 mg | ORAL_TABLET | Freq: Two times a day (BID) | ORAL | 1 refills | Status: DC

## 2017-10-28 ENCOUNTER — Ambulatory Visit
Admission: RE | Admit: 2017-10-28 | Discharge: 2017-10-28 | Disposition: A | Discharge status: Home / Self Care | Payer: Medicare HMO | Source: Ambulatory Visit | Attending: Nurse Practitioner | Admitting: Nurse Practitioner

## 2017-10-28 ENCOUNTER — Ambulatory Visit (INDEPENDENT_AMBULATORY_CARE_PROVIDER_SITE_OTHER): Payer: Medicare HMO | Admitting: Nurse Practitioner

## 2017-10-28 ENCOUNTER — Encounter: Payer: Self-pay | Admitting: Nurse Practitioner

## 2017-10-28 ENCOUNTER — Other Ambulatory Visit: Payer: Self-pay | Admitting: Nurse Practitioner

## 2017-10-28 VITALS — BP 128/73 | HR 72 | Resp 16 | Ht 64.0 in | Wt 198.0 lb

## 2017-10-28 DIAGNOSIS — M1612 Unilateral primary osteoarthritis, left hip: Secondary | ICD-10-CM

## 2017-10-28 DIAGNOSIS — M064 Inflammatory polyarthropathy: Secondary | ICD-10-CM

## 2017-10-28 DIAGNOSIS — M47897 Other spondylosis, lumbosacral region: Secondary | ICD-10-CM | POA: Diagnosis not present

## 2017-10-28 DIAGNOSIS — I10 Essential (primary) hypertension: Secondary | ICD-10-CM

## 2017-10-28 MED ORDER — PREDNISONE 10 MG (21) PO TBPK: ORAL_TABLET | ORAL | 0 refills | Status: DC

## 2017-10-28 MED ORDER — IBUPROFEN 600 MG PO TABS: 600.0000 mg | ORAL_TABLET | Freq: Three times a day (TID) | ORAL | 0 refills | Status: DC | PRN

## 2017-10-28 NOTE — Progress Notes (Signed)
[facility] [place], [place] [address]  Internal MEDICINE  Office Visit Note  Patient Name: Martha Peterson  [id number]  [id number]  Date of Service: 11/21/2017   Pt is here for a sick visit.  Chief Complaint  Patient presents with  . Leg Pain    leg and hip pain been going on for 2 1/2 weeks on and off.  . Hip Pain     The patient started having left hip pain about 2 weeks ago. She spent two days gardening and pain started shortly after that. There is place in left buttock and in left quad. She is also experiencing pain in left groin area. Pain has been so bad that it is difficult for her to walk. She is having to use her cane to help with mobility. She has also started having pain in multiple joints about a week ago. Has become concerned about rheumatoid arthritis or lupus. Her sister has lupus and has been dealing with it for many years.        Current Medication:  Outpatient Encounter Medications as of 10/28/2017  Medication Sig Note  . allopurinol (ZYLOPRIM) 100 MG tablet    . ascorbic acid (VITAMIN C) 500 MG tablet Take 500 mg by mouth daily.   Marland Kitchen aspirin EC 81 MG tablet Take 81 mg by mouth daily.   Marland Kitchen atorvastatin (LIPITOR) 10 MG tablet Take 1 tablet (10 mg total) by mouth daily at 6 PM.   . carvedilol (COREG) 25 MG tablet Take 0.5 tablets (12.5 mg total) by mouth 2 (two) times daily with a meal.   . cholecalciferol (VITAMIN D) 400 UNITS TABS tablet Take 1,000 Units by mouth.   . cloNIDine (CATAPRES) 0.1 MG tablet Take 0.1 mg by mouth daily.   . furosemide (LASIX) 40 MG tablet Take 1 tablet (40 mg total) by mouth 2 (two) times daily.   Marland Kitchen levocetirizine (XYZAL) 5 MG tablet TAKE 1 TABLET EVERY DAY  FOR  ALLERGIES   . levothyroxine (SYNTHROID, LEVOTHROID) 150 MCG tablet Take 1 tablet (150 mcg total) by mouth daily before breakfast.   . losartan-hydrochlorothiazide (HYZAAR) 100-25 MG per tablet Take 1 tablet by mouth daily.   . magnesium oxide  (MAG-OX) 400 MG tablet Take 400 mg by mouth daily.  08/20/2015: Received from: External Pharmacy  . meclizine (ANTIVERT) 12.5 MG tablet Take 12.5 mg by mouth as needed.    Marland Kitchen omega-3 acid ethyl esters (LOVAZA) 1 G capsule Take 1 g by mouth 2 (two) times daily.   Marland Kitchen omeprazole (PRILOSEC) 20 MG capsule Take 1 capsule (20 mg total) by mouth 2 (two) times daily.   Marland Kitchen oxyCODONE (OXY IR/ROXICODONE) 5 MG immediate release tablet Take 1 tablet (5 mg total) by mouth every 4 (four) hours as needed for severe pain.   . potassium chloride (K-DUR) 10 MEQ tablet Take 1 tablet (10 mEq total) by mouth daily.   . Potassium Gluconate 595 MG CAPS Take by mouth.   . traMADol (ULTRAM) 50 MG tablet Take by mouth every 8 (eight) hours as needed.   . vitamin E 400 UNIT capsule Take 400 Units by mouth 2 (two) times daily.  08/20/2015: Received from: External Pharmacy  . Vitamin E 400 units TABS    . ibuprofen (ADVIL,MOTRIN) 600 MG tablet Take 1 tablet (600 mg total) by mouth every 8 (eight) hours as needed.   . predniSONE (STERAPRED UNI-PAK 21 TAB) 10 MG (21) TBPK tablet 6 day taper - take  by mouth as directed for 6 days    No facility-administered encounter medications on file as of 10/28/2017.       Medical History: Past Medical History:  Diagnosis Date  . Arthritis   . COPD (chronic obstructive pulmonary disease) ([place])   . Diverticulitis   . Gout   . Hyperlipidemia   . Hypertension   . Hypothyroidism   . 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 Microhematuria   . Mild mitral regurgitation   . Mild tricuspid regurgitation   . Osteopenia   . Pulmonary fibrosis ([place])   . Urinary incontinence      Vital Signs: BP 128/73 (BP Location: Left Arm, Patient Position: Sitting, Cuff Size: Large)   Pulse 72   Resp 16   Ht 5\' 4"  (1.626 m)   Wt 198 lb (89.8 kg)   SpO2 95%   BMI 33.99 kg/m    Review of Systems  Constitutional: Positive for fatigue. Negative for activity change, chills and  unexpected weight change.  HENT: Negative for congestion, postnasal drip, sinus pressure, sinus pain and sore throat.   Eyes: Negative.   Respiratory: Negative for cough, shortness of breath and wheezing.   Cardiovascular: Positive for leg swelling. Negative for chest pain and palpitations.  Gastrointestinal: Negative for constipation, diarrhea, nausea and vomiting.  Endocrine:       Good blood sugar control   Genitourinary: Negative.   Musculoskeletal: Positive for arthralgias, joint swelling and myalgias.       Moderate to severe pain in left hip. Worsening generalized joint pain as well. Has noted arthritic nodes on joints of fingers.   Skin: Negative for color change and rash.  Allergic/Immunologic: Positive for environmental allergies.  Neurological: Negative for dizziness and headaches.  Hematological: Negative for adenopathy. Does not bruise/bleed easily.  Psychiatric/Behavioral: Negative.     Physical Exam  Constitutional: She is oriented to person, place, and time. She appears well-developed and well-nourished. No distress.  HENT:  Head: Normocephalic and atraumatic.  Mouth/Throat: Oropharynx is clear and moist. No oropharyngeal exudate.  Eyes: Pupils are equal, round, and reactive to light. EOM are normal.  Neck: Normal range of motion. Neck supple. No JVD present. No tracheal deviation present. No thyromegaly present.  Cardiovascular: Normal rate, regular rhythm and normal heart sounds. Exam reveals no gallop and no friction rub.  No murmur heard. Pulmonary/Chest: Effort normal and breath sounds normal. No respiratory distress. She has no wheezes. She has no rales. She exhibits no tenderness.  Abdominal: Soft. Bowel sounds are normal. There is no tenderness.  Musculoskeletal: Normal range of motion. She exhibits edema.  Moderate left hip tenderness. No bony deformity or abnormality noted. Patient needing to walk with limp favoring the left side.   Lymphadenopathy:    She  has no cervical adenopathy.  Neurological: She is alert and oriented to person, place, and time. No cranial nerve deficit.  Skin: Skin is warm and dry. Capillary refill takes 2 to 3 seconds. She is not diaphoretic.  Psychiatric: She has a normal mood and affect. Her behavior is normal. Judgment and thought content normal.  Nursing note and vitals reviewed.  Assessment/Plan: 1. Primary osteoarthritis of left hip Ibuprofen 600mg  may be taken up to TID as needed for pain. Prednisone 10mg  dose pack. Take as directed for 6 days. Will get x-ray of left hip for further evaluation.  - DG HIP UNILAT WITH PELVIS 2-3 VIEWS LEFT; Future - Rheumatoid factor - predniSONE (STERAPRED UNI-PAK 21 TAB) 10  MG (21) TBPK tablet; 6 day taper - take by mouth as directed for 6 days  Dispense: 21 tablet; Refill: 0 - ibuprofen (ADVIL,MOTRIN) 600 MG tablet; Take 1 tablet (600 mg total) by mouth every 8 (eight) hours as needed.  Dispense: 30 tablet; Refill: 0  2. Inflammatory polyarthritis (H[facility]) - ANA w/Reflex if Positive - Sedimentation rate - Uric acid - predniSONE (STERAPRED UNI-PAK 21 TAB) 10 MG (21) TBPK tablet; 6 day taper - take by mouth as directed for 6 days  Dispense: 21 tablet; Refill: 0 - ibuprofen (ADVIL,MOTRIN) 600 MG tablet; Take 1 tablet (600 mg total) by mouth every 8 (eight) hours as needed.  Dispense: 30 tablet; Refill: 0  3. Essential hypertension Stable. Continue bp medication as prescribed.    General Counseling: Martha Peterson understanding of the findings of todays visit and agrees with plan of treatment. I have discussed any further diagnostic evaluation that may be needed or ordered today. We also reviewed her medications today. she has been encouraged to call the office with any questions or concerns that should arise related to todays visit.    Counseling:  This patient was seen by [name], FNP- C in Collaboration with Dr [name] as a part of collaborative care  agreement  Orders Placed This Encounter  Procedures  . DG HIP UNILAT WITH PELVIS 2-3 VIEWS LEFT  . ANA w/Reflex if Positive  . Rheumatoid factor  . Sedimentation rate  . Uric acid    Meds ordered this encounter  Medications  . predniSONE (STERAPRED UNI-PAK 21 TAB) 10 MG (21) TBPK tablet    Sig: 6 day taper - take by mouth as directed for 6 days    Dispense:  21 tablet    Refill:  0    Order Specific Question:   Supervising Provider    Answer:   [name] [place]  . ibuprofen (ADVIL,MOTRIN) 600 MG tablet    Sig: Take 1 tablet (600 mg total) by mouth every 8 (eight) hours as needed.    Dispense:  30 tablet    Refill:  0    Order Specific Question:   Supervising Provider    Answer:   [name] [[id number]]    Time spent: 15 Minutes

## 2017-10-29 LAB — URIC ACID: URIC ACID: 6.5 mg/dL (ref 2.5–7.1)

## 2017-10-29 LAB — ANA W/REFLEX IF POSITIVE: Anti Nuclear Antibody(ANA): NEGATIVE

## 2017-10-29 LAB — RHEUMATOID FACTOR: Rhuematoid fact SerPl-aCnc: 23.2 IU/mL — ABNORMAL HIGH (ref 0.0–13.9)

## 2017-10-29 LAB — SEDIMENTATION RATE: Sed Rate: 18 mm/hr (ref 0–40)

## 2017-11-01 ENCOUNTER — Encounter: Payer: Self-pay | Admitting: Nurse Practitioner

## 2017-11-01 NOTE — Progress Notes (Signed)
Pt was notified. Will forward message to [facility] to get the referral.

## 2017-11-05 IMAGING — CR DG ANKLE COMPLETE 3+V*L*
1 series · 3 of 3 positions shown · non-contrast
Comparison: 09/19/2014 plain film exam of the left foot.

CLINICAL DATA: 75-year-old female with ankle swelling. Injury.
Prior surgery. Initial encounter.

EXAM:
LEFT ANKLE COMPLETE - 3+ VIEW

[Series 1: dg ankle complete left · 0.14mm/px · 3 of 3 slices shown]
[im 1/3]
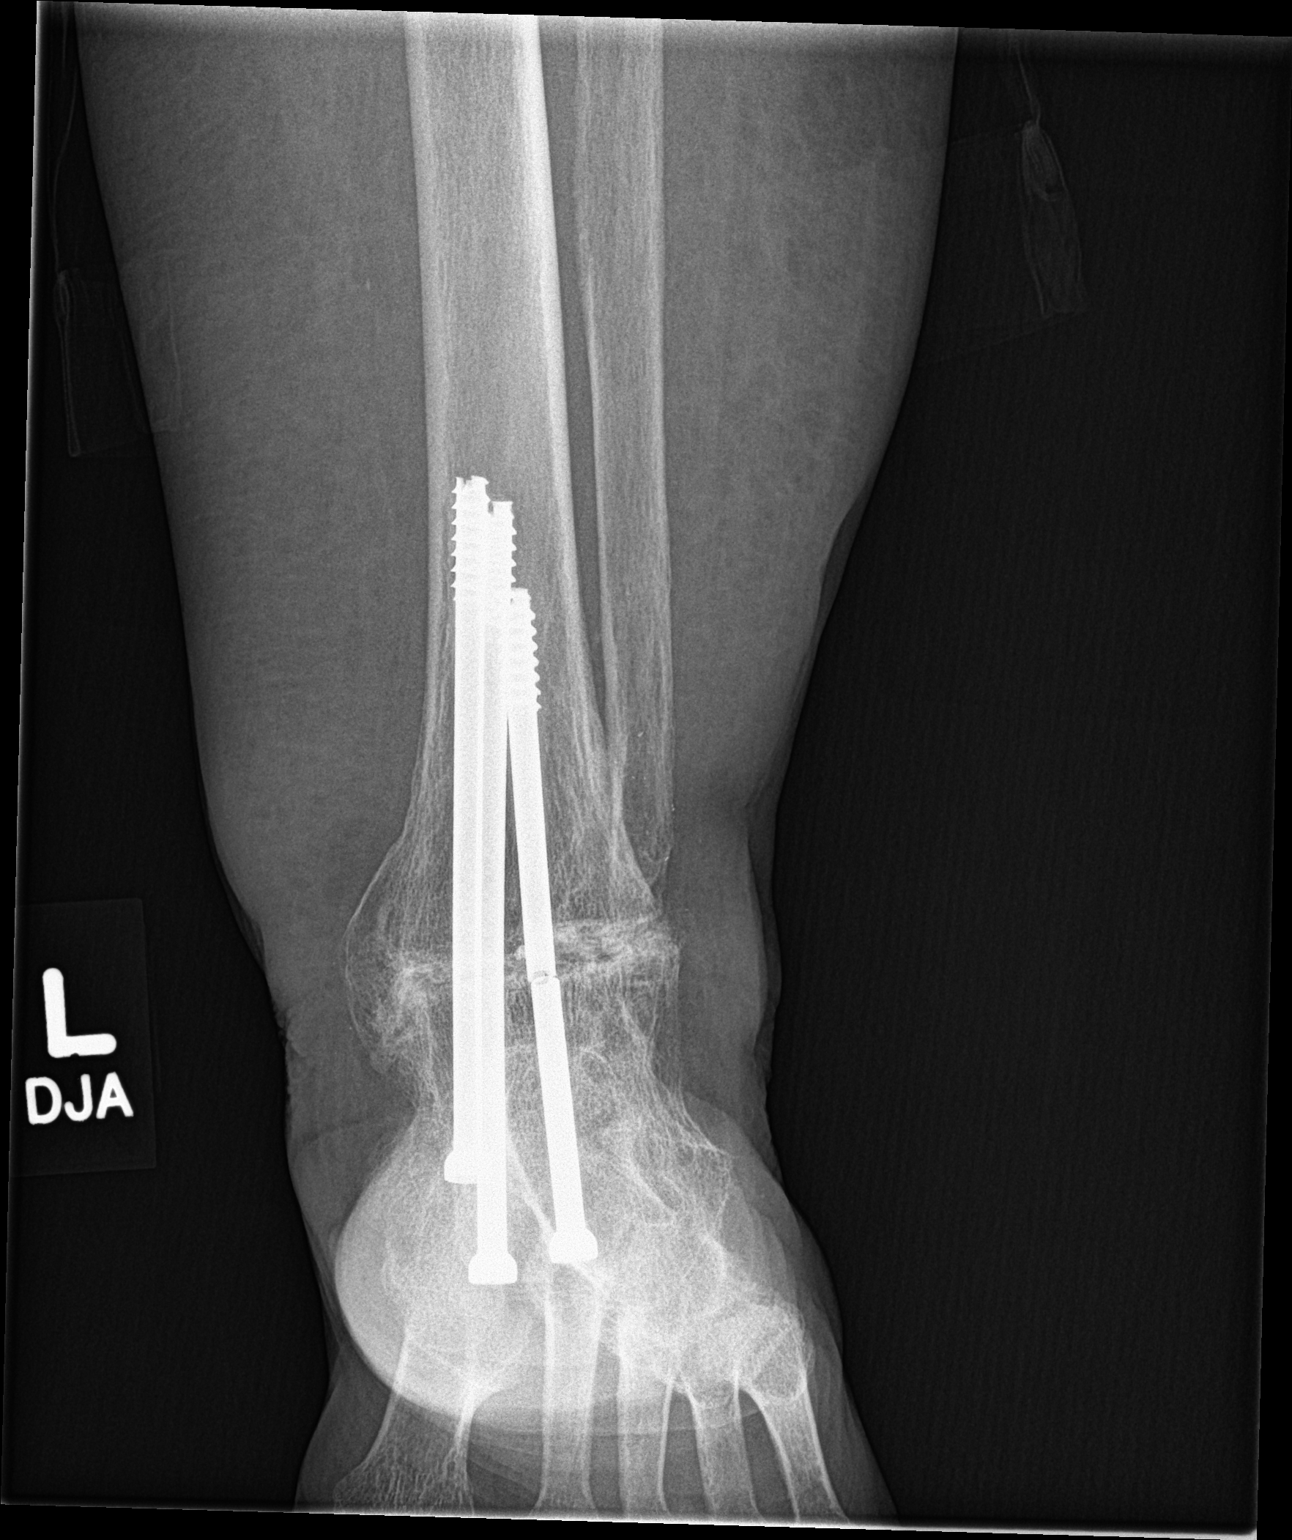
[im 2/3]
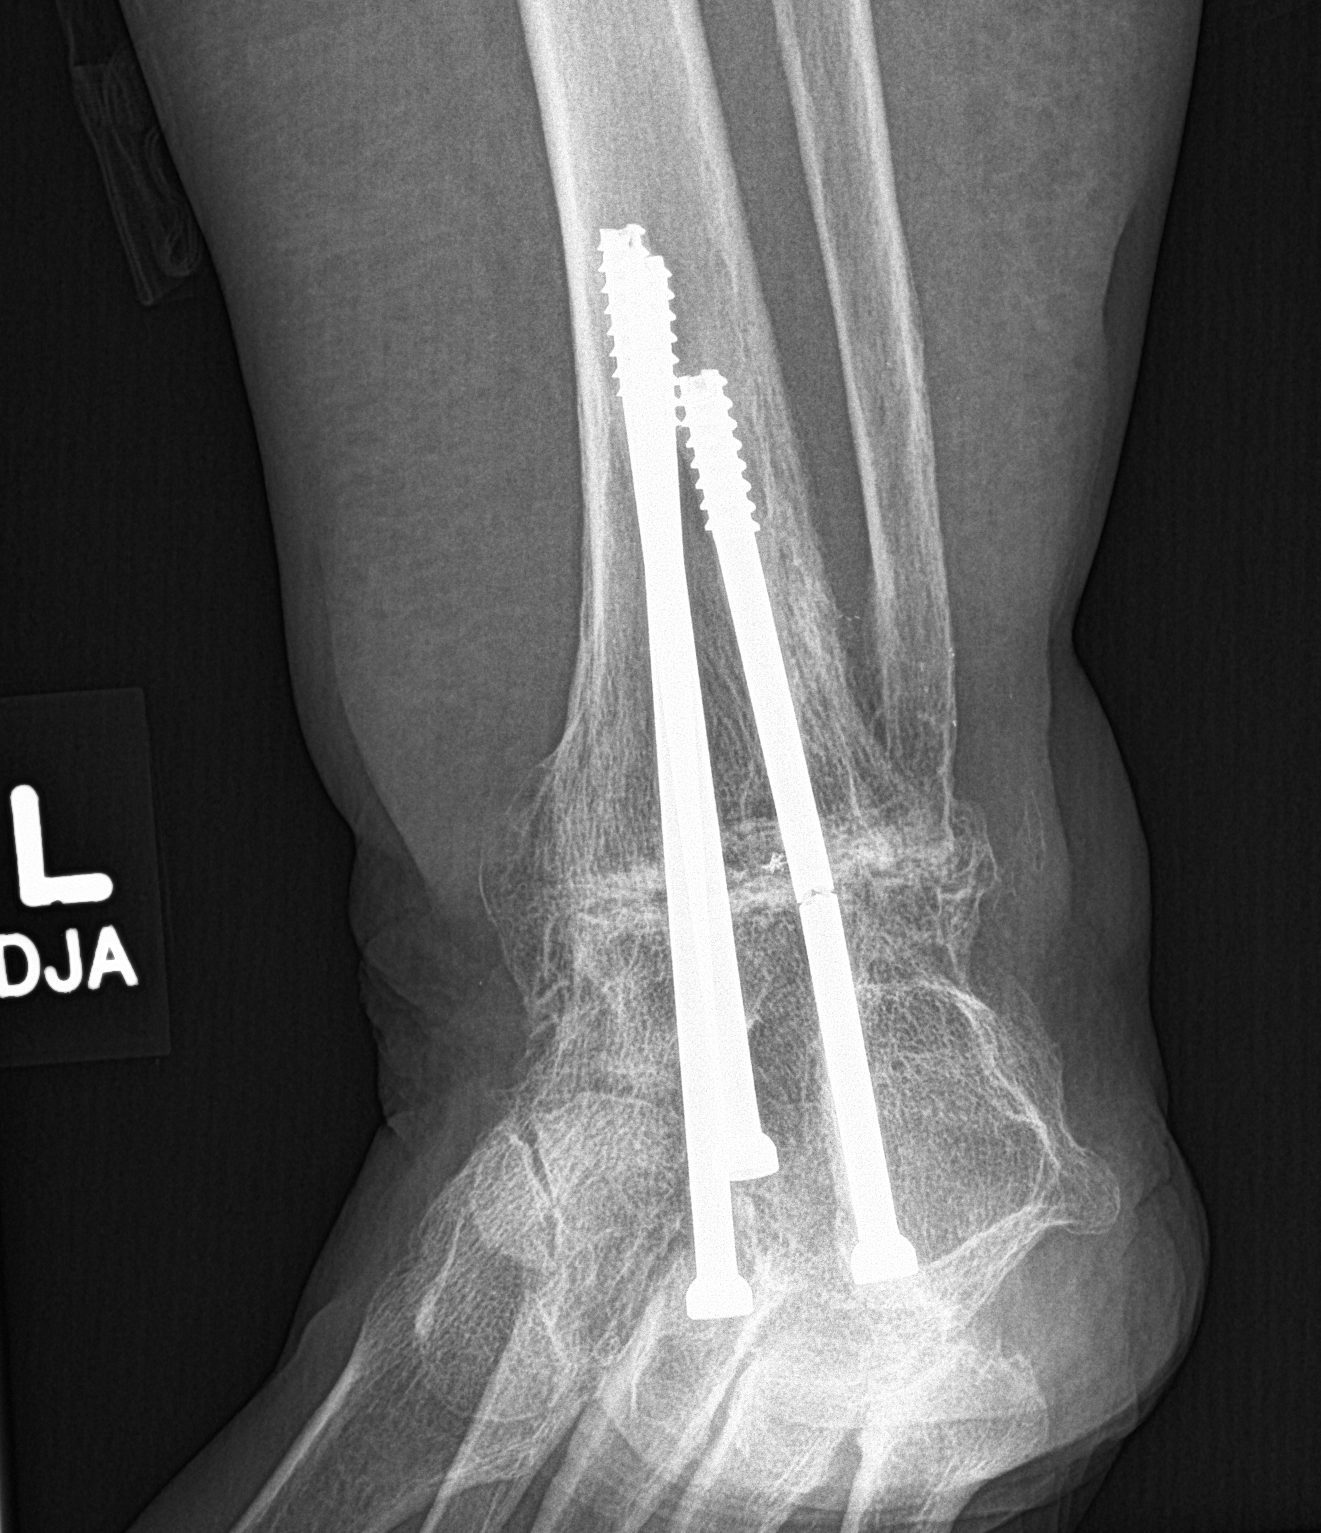
[im 3/3]
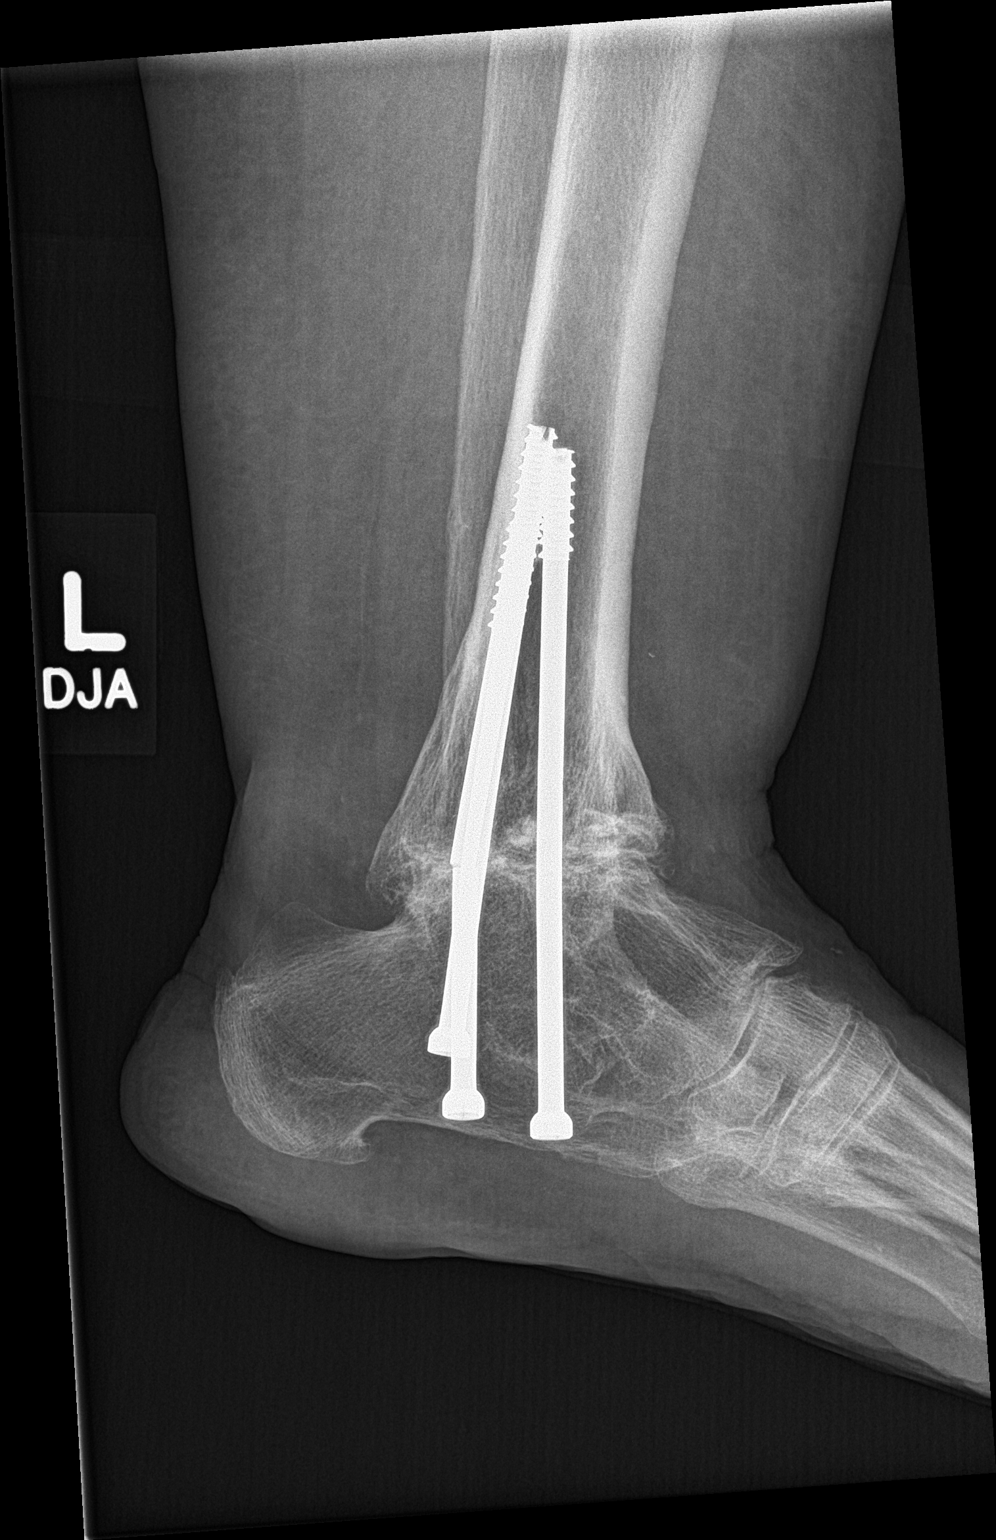

[3 of 3 positions shown; findings below may reference images not displayed]

FINDINGS: Remote fusion left calcaneus, talus and tibia with screws in place,
one of which is fractured.

Partial resection of the distal left fibula.

No acute fracture detected.

Prominent soft tissue swelling possibly related to patient's
habitus, dependent edema or cellulitis.

No plain film evidence of osteomyelitis.

Small spur plantar fascia insertion and Achilles tendon insertion
site.
IMPRESSION: Remote fusion left calcaneus, talus and tibia with screws in place,
one of which is fractured.

Partial resection of the distal left fibula.

No acute fracture detected.

Prominent soft tissue swelling possibly related to patient's
habitus, dependent edema or cellulitis.

## 2017-11-05 IMAGING — CR DG TIBIA/FIBULA 2V*L*
1 series · 4 of 4 positions shown · non-contrast
Comparison: Ankle films same date dictated separately.

CLINICAL DATA: 75-year-old female with pain.  Prior surgery.

EXAM:
LEFT TIBIA AND FIBULA - 2 VIEW

[Series 1: dg tibia/fibula left · 0.14mm/px · 4 of 4 slices shown]
[im 1/4]
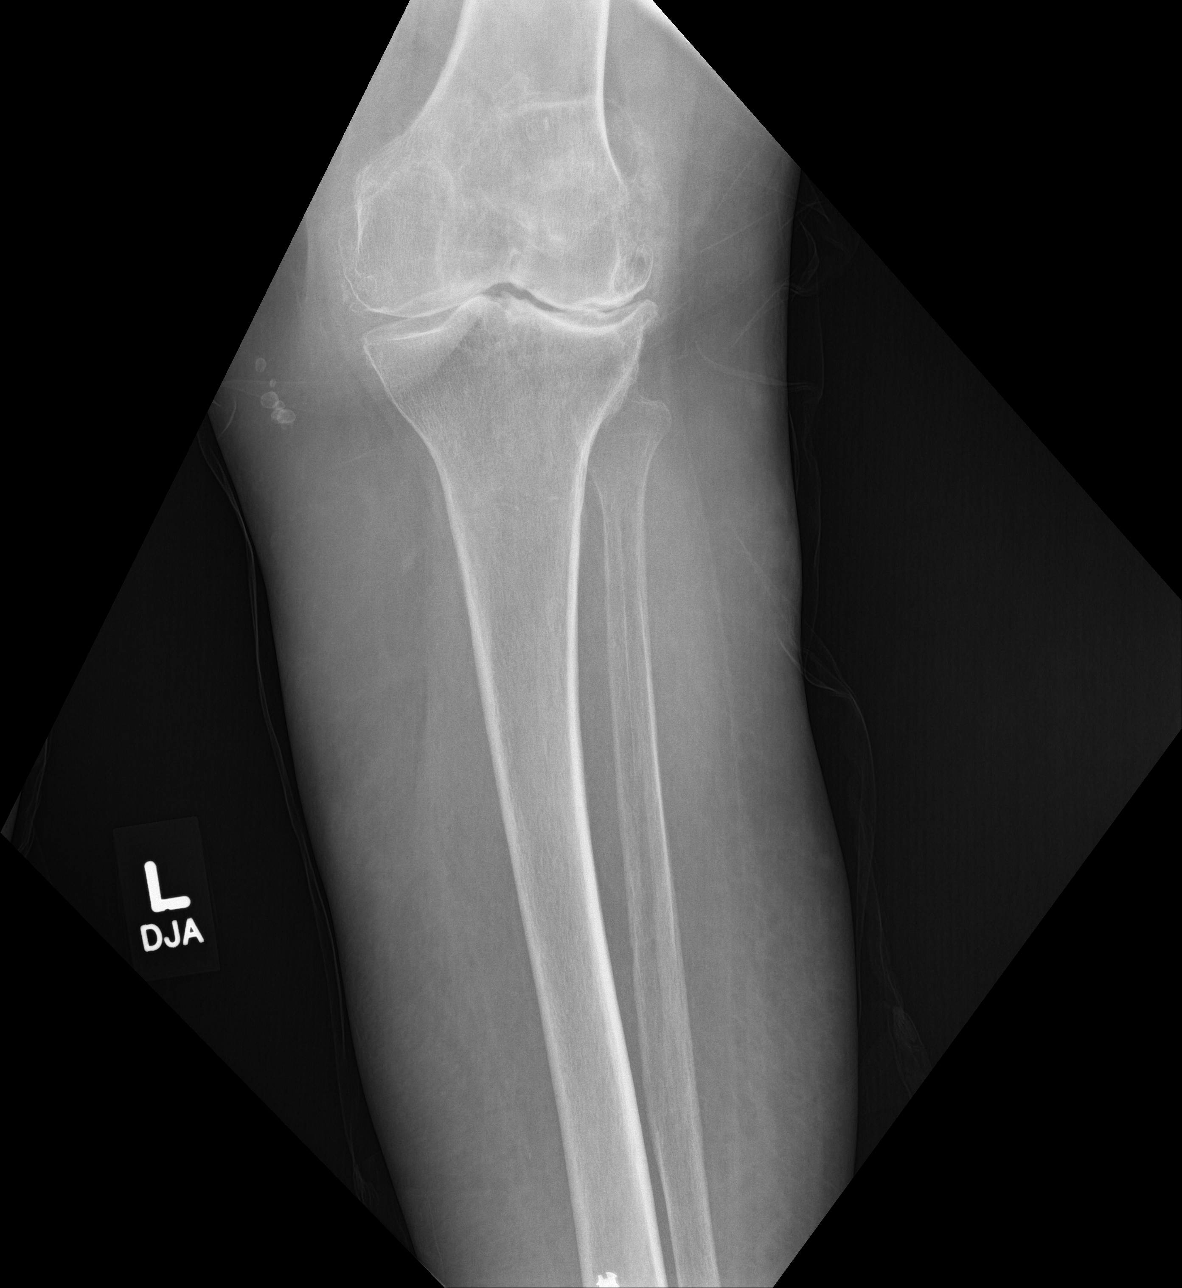
[im 2/4]
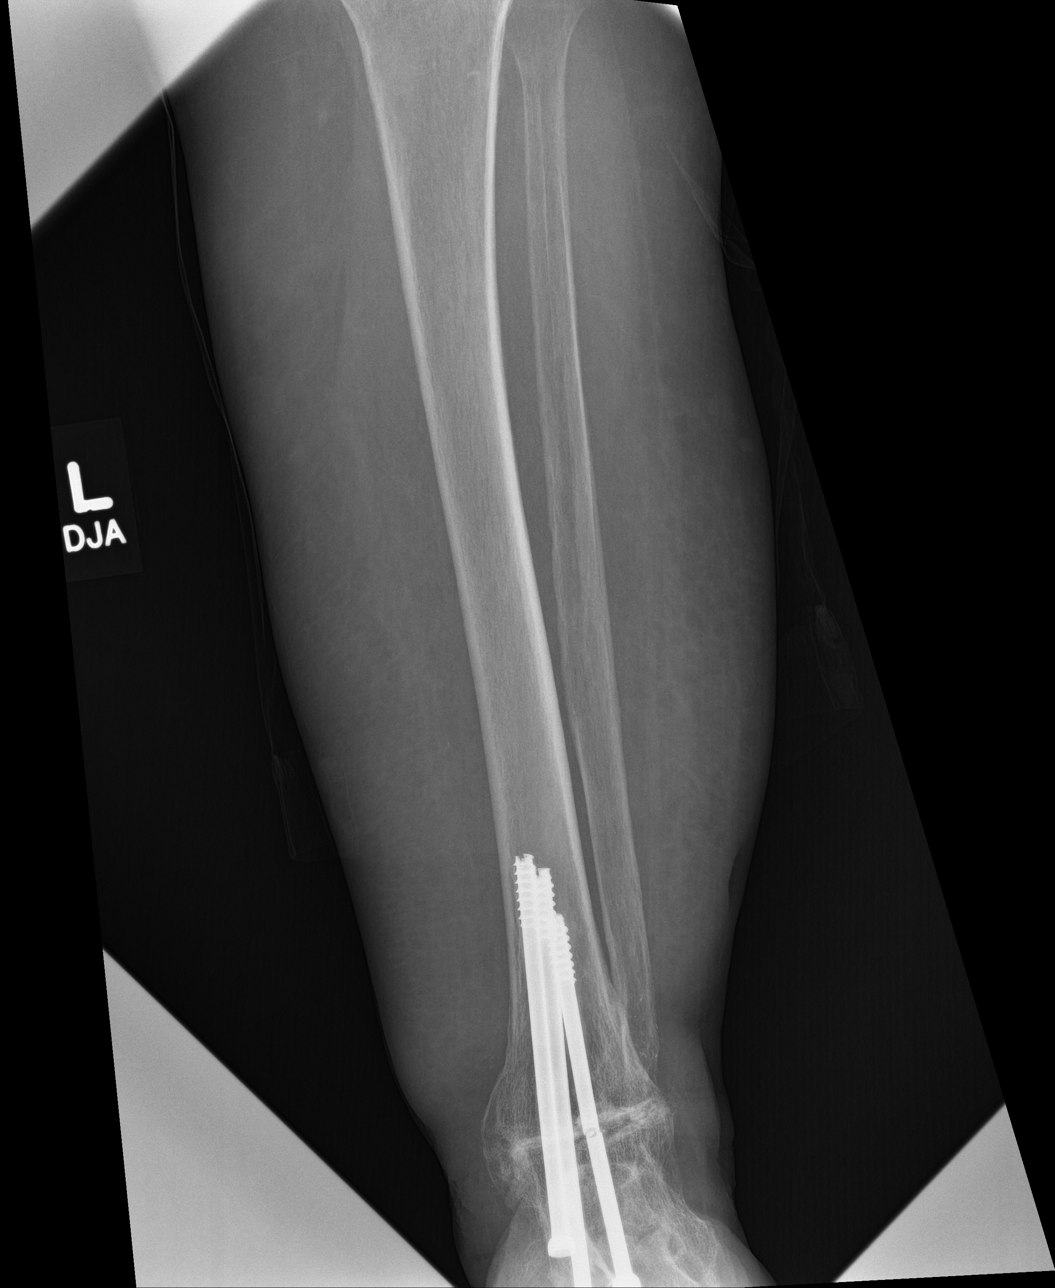
[im 3/4]
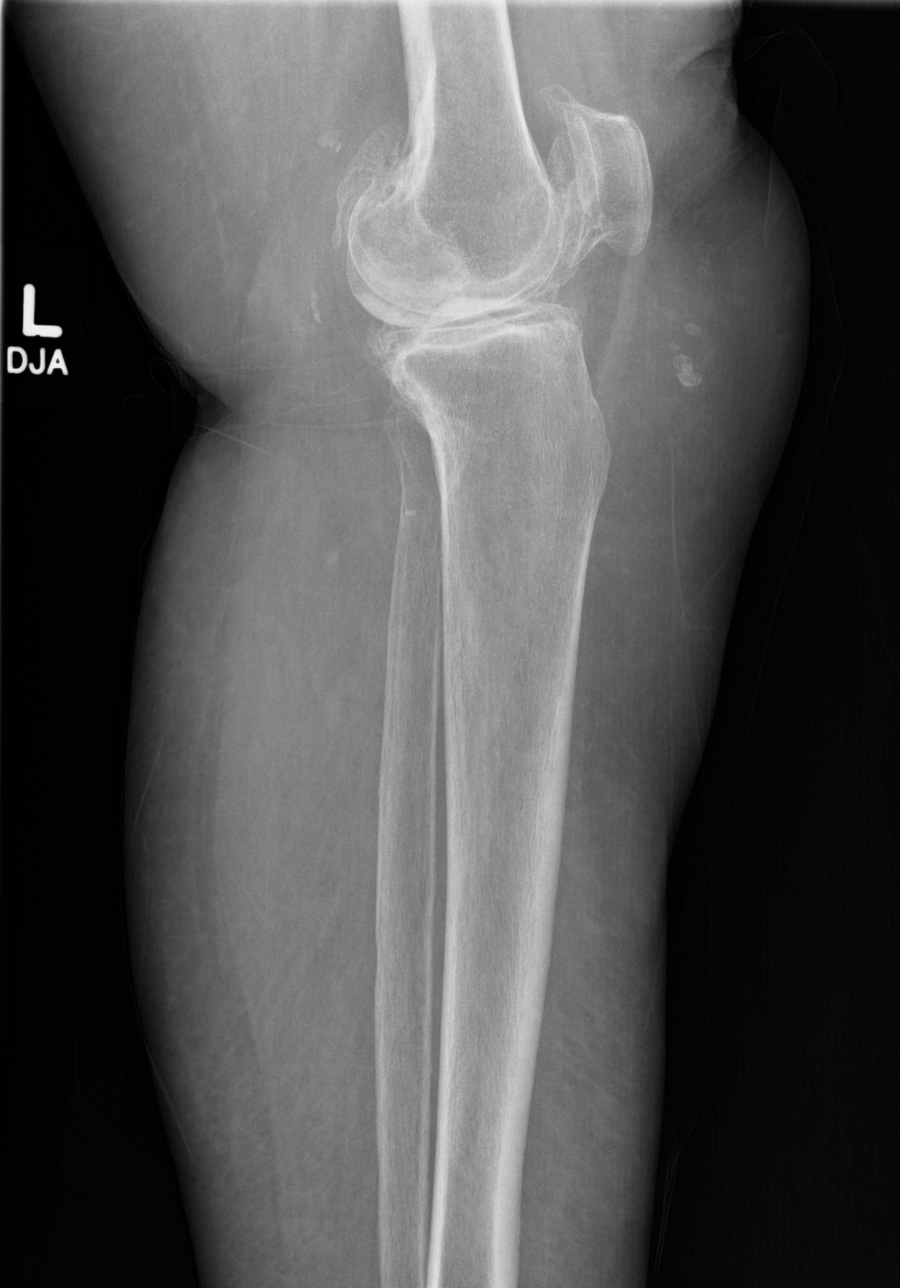
[im 4/4]
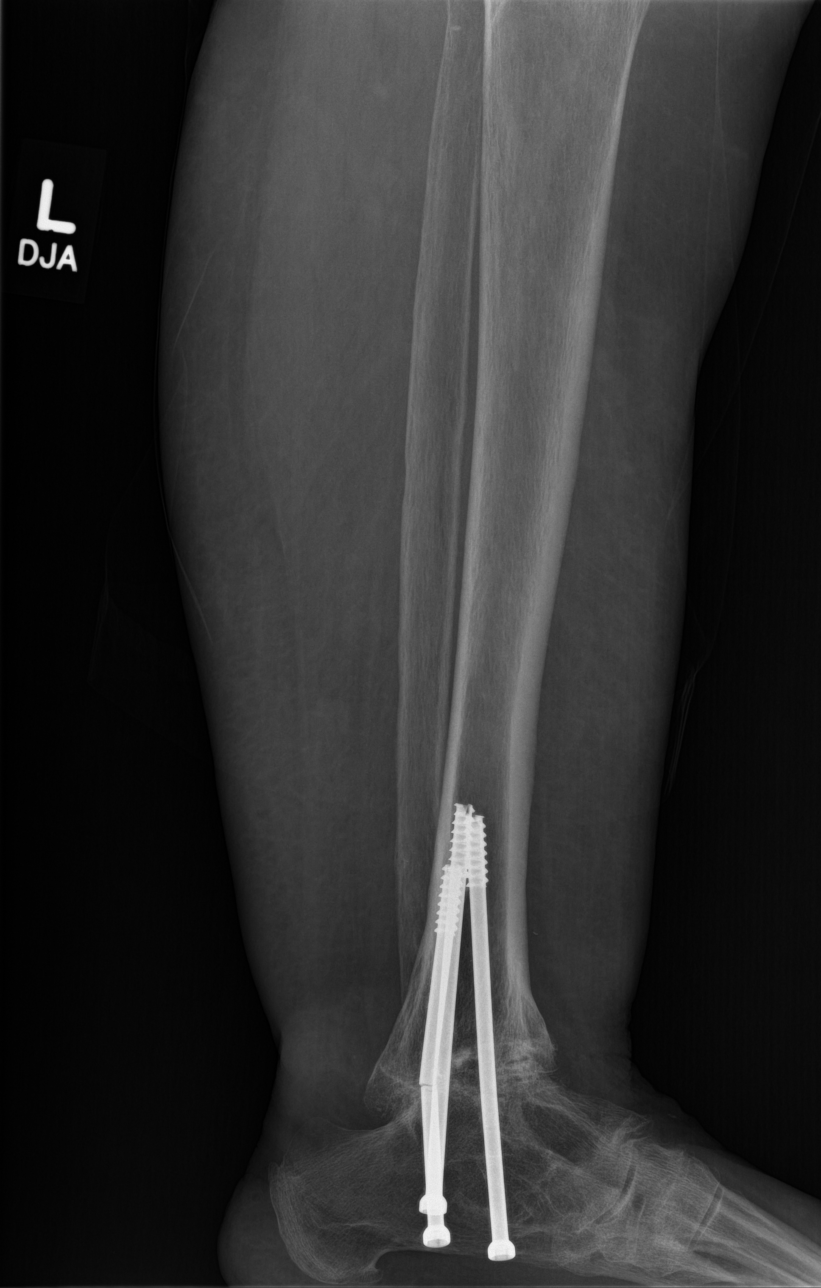

[4 of 4 positions shown; findings below may reference images not displayed]

FINDINGS: Prominent lateral tibial femoral joint degenerative changes.
Moderate patellofemoral joint degenerative changes.

Tiny suprapatellar joint effusion.

Remote fusion left calcaneus, talus and tibia with screws in place,
one of which is fractured.

Partial resection of the distal left fibula.

No acute fracture detected.

Prominent soft tissue swelling possibly related to patient's
habitus, deep dependent edema or cellulitis.
IMPRESSION: Prominent lateral tibial femoral joint degenerative changes.
Moderate patellofemoral joint degenerative changes.

Remote fusion left calcaneus, talus and tibia with screws in place,
one of which is fractured.

Partial resection of the distal left fibula.

No acute fracture detected.

Prominent soft tissue swelling possibly related to patient's
habitus, deep dependent edema or cellulitis.

## 2017-11-24 ENCOUNTER — Other Ambulatory Visit: Payer: Self-pay | Admitting: Gastroenterology

## 2017-11-24 DIAGNOSIS — K7460 Unspecified cirrhosis of liver: Secondary | ICD-10-CM

## 2017-11-24 DIAGNOSIS — Z86010 Personal history of colonic polyps: Secondary | ICD-10-CM | POA: Diagnosis not present

## 2017-11-29 ENCOUNTER — Other Ambulatory Visit: Payer: Self-pay | Admitting: Internal Medicine

## 2017-12-06 ENCOUNTER — Encounter: Payer: Self-pay | Admitting: Nurse Practitioner

## 2017-12-06 ENCOUNTER — Ambulatory Visit (INDEPENDENT_AMBULATORY_CARE_PROVIDER_SITE_OTHER): Payer: Medicare HMO | Admitting: Nurse Practitioner

## 2017-12-06 VITALS — BP 116/45 | Resp 16 | Ht 63.0 in | Wt 206.4 lb

## 2017-12-06 DIAGNOSIS — M1612 Unilateral primary osteoarthritis, left hip: Secondary | ICD-10-CM | POA: Diagnosis not present

## 2017-12-06 DIAGNOSIS — I10 Essential (primary) hypertension: Secondary | ICD-10-CM | POA: Diagnosis not present

## 2017-12-06 DIAGNOSIS — M064 Inflammatory polyarthropathy: Secondary | ICD-10-CM

## 2017-12-06 NOTE — Progress Notes (Signed)
[facility] [place], [place] [address]  Internal MEDICINE  Office Visit Note  Patient Name: Martha Peterson  [id number]  [id number]  Date of Service: 12/06/2017  Chief Complaint  Patient presents with  . Osteoarthritis    low back and hip     The patient started having left hip pain about 2 weeks ago. She spent two days gardening and pain started shortly after that. There is place in left buttock and in left quad. She is also experiencing pain in left groin area. Pain has been so bad that it is difficult for her to walk. States that pain in hip is nearly resolved. Now pain is concentrated in the lower back. She states that she does have a lot going on. Taking care of adult daughter and her husband. Causing a great deal of stress. Labs done showed elevated RA factor. X-ray of her left hip did indicated moderate to severe osteoarthritis in the hip. She is scheduled to see a rheumatologist 12/24/2017.     Pt is here for routine follow up.    Current Medication: Outpatient Encounter Medications as of 12/06/2017  Medication Sig Note  . allopurinol (ZYLOPRIM) 100 MG tablet    . ascorbic acid (VITAMIN C) 500 MG tablet Take 500 mg by mouth daily.   Marland Kitchen aspirin EC 81 MG tablet Take 81 mg by mouth daily.   Marland Kitchen atorvastatin (LIPITOR) 10 MG tablet Take 1 tablet (10 mg total) by mouth daily at 6 PM.   . carvedilol (COREG) 25 MG tablet Take 0.5 tablets (12.5 mg total) by mouth 2 (two) times daily with a meal.   . cholecalciferol (VITAMIN D) 400 UNITS TABS tablet Take 1,000 Units by mouth.   . cloNIDine (CATAPRES) 0.1 MG tablet Take 0.1 mg by mouth daily.   . furosemide (LASIX) 40 MG tablet Take 1 tablet (40 mg total) by mouth 2 (two) times daily.   Marland Kitchen ibuprofen (ADVIL,MOTRIN) 600 MG tablet Take 1 tablet (600 mg total) by mouth every 8 (eight) hours as needed.   Marland Kitchen levocetirizine (XYZAL) 5 MG tablet TAKE 1 TABLET EVERY DAY  FOR  ALLERGIES   . levothyroxine (SYNTHROID,  LEVOTHROID) 150 MCG tablet Take 1 tablet (150 mcg total) by mouth daily before breakfast.   . losartan-hydrochlorothiazide (HYZAAR) 100-25 MG per tablet Take 1 tablet by mouth daily.   . magnesium oxide (MAG-OX) 400 MG tablet Take 400 mg by mouth daily.  08/20/2015: Received from: External Pharmacy  . meclizine (ANTIVERT) 12.5 MG tablet Take 12.5 mg by mouth as needed.    Marland Kitchen omega-3 acid ethyl esters (LOVAZA) 1 G capsule Take 1 g by mouth 2 (two) times daily.   Marland Kitchen omeprazole (PRILOSEC) 20 MG capsule Take 1 capsule (20 mg total) by mouth 2 (two) times daily.   Marland Kitchen oxyCODONE (OXY IR/ROXICODONE) 5 MG immediate release tablet Take 1 tablet (5 mg total) by mouth every 4 (four) hours as needed for severe pain.   . potassium chloride (K-DUR) 10 MEQ tablet Take 1 tablet (10 mEq total) by mouth daily.   . Potassium Gluconate 595 MG CAPS Take by mouth.   . predniSONE (STERAPRED UNI-PAK 21 TAB) 10 MG (21) TBPK tablet 6 day taper - take by mouth as directed for 6 days   . traMADol (ULTRAM) 50 MG tablet Take by mouth every 8 (eight) hours as needed.   . vitamin E 400 UNIT capsule Take 400 Units by mouth 2 (two) times daily.  08/20/2015:  Received from: External Pharmacy  . Vitamin E 400 units TABS     No facility-administered encounter medications on file as of 12/06/2017.     Surgical History: Past Surgical History:  Procedure Laterality Date  . ABDOMINAL HYSTERECTOMY    . APPENDECTOMY    . CHOLECYSTECTOMY    . COLONOSCOPY WITH PROPOFOL N/A 12/04/2014   Procedure: COLONOSCOPY WITH PROPOFOL;  Surgeon: Lollie Sails, MD;  Location: [facility];  Service: Endoscopy;  Laterality: N/A;  . FRACTURE SURGERY     Femur Fx; LT Ankle Pinning  . JOINT REPLACEMENT     RT TKR  . Patial Thyroid Resection      Medical History: Past Medical History:  Diagnosis Date  . Arthritis   . COPD (chronic obstructive pulmonary disease) ([place])   . Diverticulitis   . Gout   . Hyperlipidemia   . Hypertension   .  Hypothyroidism   . 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 Microhematuria   . Mild mitral regurgitation   . Mild tricuspid regurgitation   . Osteopenia   . Pulmonary fibrosis ([place])   . Urinary incontinence     Family History: Family History  Problem Relation Age of Onset  . Heart Problems Mother   . Heart Problems Father   . Diabetes Father   . Lupus Sister     Social History   Socioeconomic History  . Marital status: Married    Spouse name: Not on file  . Number of children: Not on file  . Years of education: Not on file  . Highest education level: Not on file  Occupational History  . Not on file  Social Needs  . Financial resource strain: Not on file  . Food insecurity:    Worry: Not on file    Inability: Not on file  . Transportation needs:    Medical: Not on file    Non-medical: Not on file  Tobacco Use  . Smoking status: Former Smoker    Years: 1.00    Types: Cigarettes  . Smokeless tobacco: Never Used  Substance and Sexual Activity  . Alcohol use: No  . Drug use: No  . Sexual activity: Not Currently  Lifestyle  . Physical activity:    Days per week: Not on file    Minutes per session: Not on file  . Stress: Not on file  Relationships  . Social connections:    Talks on phone: Not on file    Gets together: Not on file    Attends religious service: Not on file    Active member of club or organization: Not on file    Attends meetings of clubs or organizations: Not on file    Relationship status: Not on file  . Intimate partner violence:    Fear of current or ex partner: Not on file    Emotionally abused: Not on file    Physically abused: Not on file    Forced sexual activity: Not on file  Other Topics Concern  . Not on file  Social History Narrative  . Not on file      Review of Systems  Constitutional: Positive for fatigue. Negative for activity change, chills and unexpected weight change.  HENT: Negative for  congestion, postnasal drip, sinus pressure, sinus pain and sore throat.   Eyes: Negative.   Respiratory: Negative for cough, shortness of breath and wheezing.   Cardiovascular: Positive for leg swelling. Negative for chest pain and palpitations.  Gastrointestinal:  Negative for constipation, diarrhea, nausea and vomiting.  Endocrine:       Good blood sugar control   Genitourinary: Negative.   Musculoskeletal: Positive for arthralgias, joint swelling and myalgias.       Moderate to severe pain in left hip. Improved since her most recent visit. Walking better, no longer using cane to help with mobility.   Skin: Negative for color change and rash.  Allergic/Immunologic: Positive for environmental allergies.  Neurological: Negative for dizziness and headaches.  Hematological: Negative for adenopathy. Does not bruise/bleed easily.  Psychiatric/Behavioral: Negative.     Vital Signs: BP (!) 116/45   Resp 16   Ht 5\' 3"  (1.6 m)   Wt 206 lb 6.4 oz (93.6 kg)   SpO2 92%   BMI 36.56 kg/m    Physical Exam  Constitutional: She is oriented to person, place, and time. She appears well-developed and well-nourished. No distress.  HENT:  Head: Normocephalic and atraumatic.  Mouth/Throat: Oropharynx is clear and moist. No oropharyngeal exudate.  Eyes: Pupils are equal, round, and reactive to light. EOM are normal.  Neck: Normal range of motion. Neck supple. No JVD present. No tracheal deviation present. No thyromegaly present.  Cardiovascular: Normal rate, regular rhythm and normal heart sounds. Exam reveals no gallop and no friction rub.  No murmur heard. Pulmonary/Chest: Effort normal and breath sounds normal. No respiratory distress. She has no wheezes. She has no rales. She exhibits no tenderness.  Abdominal: Soft. Bowel sounds are normal. There is no tenderness.  Musculoskeletal: Normal range of motion. She exhibits edema.  Moderate left hip tenderness. No bony deformity or abnormality noted.  Patient needing to walk with limp favoring the left side.   Lymphadenopathy:    She has no cervical adenopathy.  Neurological: She is alert and oriented to person, place, and time. No cranial nerve deficit.  Skin: Skin is warm and dry. Capillary refill takes 2 to 3 seconds. She is not diaphoretic.  Psychiatric: She has a normal mood and affect. Her behavior is normal. Judgment and thought content normal.  Nursing note and vitals reviewed.   Assessment/Plan: 1. Primary osteoarthritis of left hip Reviewed x-ray of left hip. Showed moderate to severe osteoarthritis of left hip. Pain improved. Continue to use prescribed pain medications as needed and as prescribed. Continue to monitor.   2. Inflammatory polyarthritis (HCC) RA factor elevated on recent labs. She has new patient appointment with rheumatologist 12/24/2017  3. Essential hypertension Stable.   General Counseling: gardenia witter understanding of the findings of todays visit and agrees with plan of treatment. I have discussed any further diagnostic evaluation that may be needed or ordered today. We also reviewed her medications today. she has been encouraged to call the office with any questions or concerns that should arise related to todays visit.    Counseling:    Time spent: 73 Minutes       Dr [name] Internal medicine

## 2017-12-07 ENCOUNTER — Ambulatory Visit: Payer: Medicare HMO
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
## 2017-12-23 DIAGNOSIS — B301 Conjunctivitis due to adenovirus: Secondary | ICD-10-CM | POA: Diagnosis not present

## 2017-12-24 ENCOUNTER — Ambulatory Visit (INDEPENDENT_AMBULATORY_CARE_PROVIDER_SITE_OTHER): Payer: Medicare HMO

## 2017-12-24 ENCOUNTER — Encounter: Payer: Self-pay | Admitting: Rheumatology

## 2017-12-24 ENCOUNTER — Ambulatory Visit: Payer: Medicare HMO | Admitting: Rheumatology

## 2017-12-24 VITALS — BP 108/62 | HR 66 | Resp 15 | Ht 62.6 in | Wt 202.0 lb

## 2017-12-24 DIAGNOSIS — I10 Essential (primary) hypertension: Secondary | ICD-10-CM

## 2017-12-24 DIAGNOSIS — M19041 Primary osteoarthritis, right hand: Secondary | ICD-10-CM | POA: Diagnosis not present

## 2017-12-24 DIAGNOSIS — Z8709 Personal history of other diseases of the respiratory system: Secondary | ICD-10-CM | POA: Diagnosis not present

## 2017-12-24 DIAGNOSIS — Z8739 Personal history of other diseases of the musculoskeletal system and connective tissue: Secondary | ICD-10-CM

## 2017-12-24 DIAGNOSIS — M8580 Other specified disorders of bone density and structure, unspecified site: Secondary | ICD-10-CM | POA: Diagnosis not present

## 2017-12-24 DIAGNOSIS — M1612 Unilateral primary osteoarthritis, left hip: Secondary | ICD-10-CM

## 2017-12-24 DIAGNOSIS — Z8639 Personal history of other endocrine, nutritional and metabolic disease: Secondary | ICD-10-CM

## 2017-12-24 DIAGNOSIS — D472 Monoclonal gammopathy: Secondary | ICD-10-CM

## 2017-12-24 DIAGNOSIS — Z96651 Presence of right artificial knee joint: Secondary | ICD-10-CM | POA: Diagnosis not present

## 2017-12-24 DIAGNOSIS — I517 Cardiomegaly: Secondary | ICD-10-CM

## 2017-12-24 DIAGNOSIS — M19042 Primary osteoarthritis, left hand: Secondary | ICD-10-CM | POA: Diagnosis not present

## 2017-12-24 DIAGNOSIS — I6523 Occlusion and stenosis of bilateral carotid arteries: Secondary | ICD-10-CM | POA: Diagnosis not present

## 2017-12-24 DIAGNOSIS — R768 Other specified abnormal immunological findings in serum: Secondary | ICD-10-CM

## 2017-12-24 MED ORDER — DICLOFENAC SODIUM 1 % TD GEL: TRANSDERMAL | 3 refills | Status: AC

## 2017-12-24 NOTE — Patient Instructions (Addendum)
Hand Exercises Hand exercises can be helpful to almost anyone. These exercises can strengthen the hands, improve flexibility and movement, and increase blood flow to the hands. These results can make work and daily tasks easier. Hand exercises can be especially helpful for people who have joint pain from arthritis or have nerve damage from overuse (carpal tunnel syndrome). These exercises can also help people who have injured a hand. Most of these hand exercises are fairly gentle stretching routines. You can do them often throughout the day. Still, it is a good idea to ask your health care provider which exercises would be best for you. Warming your hands before exercise may help to reduce stiffness. You can do this with gentle massage or by placing your hands in warm water for 15 minutes. Also, make sure you pay attention to your level of hand pain as you begin an exercise routine. Exercises Knuckle Bend Repeat this exercise 5-10 times with each hand. 1. Stand or sit with your arm, hand, and all five fingers pointed straight up. Make sure your wrist is straight. 2. Gently and slowly bend your fingers down and inward until the tips of your fingers are touching the tops of your palm. 3. Hold this position for a few seconds. 4. Extend your fingers out to their original position, all pointing straight up again.  Finger Fan Repeat this exercise 5-10 times with each hand. 1. Hold your arm and hand out in front of you. Keep your wrist straight. 2. Squeeze your hand into a fist. 3. Hold this position for a few seconds. 4. Fan out, or spread apart, your hand and fingers as much as possible, stretching every joint fully.  Tabletop Repeat this exercise 5-10 times with each hand. 1. Stand or sit with your arm, hand, and all five fingers pointed straight up. Make sure your wrist is straight. 2. Gently and slowly bend your fingers at the knuckles where they meet the hand until your hand is making an  upside-down L shape. Your fingers should form a tabletop. 3. Hold this position for a few seconds. 4. Extend your fingers out to their original position, all pointing straight up again.  Making Os Repeat this exercise 5-10 times with each hand. 1. Stand or sit with your arm, hand, and all five fingers pointed straight up. Make sure your wrist is straight. 2. Make an O shape by touching your pointer finger to your thumb. Hold for a few seconds. Then open your hand wide. 3. Repeat this motion with each finger on your hand.  Table Spread Repeat this exercise 5-10 times with each hand. 1. Place your hand on a table with your palm facing down. Make sure your wrist is straight. 2. Spread your fingers out as much as possible. Hold this position for a few seconds. 3. Slide your fingers back together again. Hold for a few seconds.  Ball Grip  Repeat this exercise 10-15 times with each hand. 1. Hold a tennis ball or another soft ball in your hand. 2. While slowly increasing pressure, squeeze the ball as hard as possible. 3. Squeeze as hard as you can for 3-5 seconds. 4. Relax and repeat.  Wrist Curls Repeat this exercise 10-15 times with each hand. 1. Sit in a chair that has armrests. 2. Hold a light weight in your hand, such as a dumbbell that weighs 1-3 pounds (0.5-1.4 kg). Ask your health care provider what weight would be best for you. 3. Rest your hand just over   the end of the chair arm with your palm facing up. 4. Gently pivot your wrist up and down while holding the weight. Do not twist your wrist from side to side.  Contact a health care provider if:  Your hand pain or discomfort gets much worse when you do an exercise.  Your hand pain or discomfort does not improve within 2 hours after you exercise. If you have any of these problems, stop doing these exercises right away. Do not do them again unless your health care provider says that you can. Get help right away if:  You  develop sudden, severe hand pain. If this happens, stop doing these exercises right away. Do not do them again unless your health care provider says that you can. This information is not intended to replace advice given to you by your health care provider. Make sure you discuss any questions you have with your health care provider. Document Released: 05/13/2015 Document Revised: 11/07/2015 Document Reviewed: 12/10/2014 Elsevier Interactive Patient Education  2018 [employer]. Natural anti-inflammatories  You can purchase these at [employer], [employer] or online.  . Turmeric (capsules)  . Ginger (ginger root or capsules)  . Omega 3 (Fish, flax seeds, chia seeds, walnuts, almonds)  . Tart cherry (dried or extract)   Patient should be under the care of a physician while taking these supplements. This may not be reproduced without the permission of Dr. Bo Merino.

## 2017-12-28 ENCOUNTER — Other Ambulatory Visit: Payer: Self-pay

## 2017-12-28 MED ORDER — FUROSEMIDE 40 MG PO TABS: ORAL_TABLET | ORAL | 1 refills | Status: DC

## 2017-12-28 MED ORDER — POTASSIUM CHLORIDE ER 10 MEQ PO TBCR: 10.0000 meq | EXTENDED_RELEASE_TABLET | Freq: Every day | ORAL | 1 refills | Status: DC

## 2017-12-28 MED ORDER — OMEPRAZOLE 20 MG PO CPDR: DELAYED_RELEASE_CAPSULE | ORAL | 1 refills | Status: DC

## 2018-01-04 ENCOUNTER — Other Ambulatory Visit: Payer: Self-pay

## 2018-01-04 MED ORDER — ALLOPURINOL 100 MG PO TABS: 100.0000 mg | ORAL_TABLET | Freq: Every day | ORAL | 1 refills | Status: DC

## 2018-01-14 DIAGNOSIS — H26493 Other secondary cataract, bilateral: Secondary | ICD-10-CM | POA: Diagnosis not present

## 2018-01-18 ENCOUNTER — Encounter: Admission: RE | Payer: Self-pay | Source: Ambulatory Visit

## 2018-01-18 ENCOUNTER — Ambulatory Visit: Admission: RE | Admit: 2018-01-18 | Payer: Medicare HMO | Source: Ambulatory Visit | Admitting: Gastroenterology

## 2018-01-18 SURGERY — ESOPHAGOGASTRODUODENOSCOPY (EGD) WITH PROPOFOL
Anesthesia: General

## 2018-01-20 ENCOUNTER — Other Ambulatory Visit: Payer: Self-pay | Admitting: Nurse Practitioner

## 2018-01-20 DIAGNOSIS — E559 Vitamin D deficiency, unspecified: Secondary | ICD-10-CM | POA: Diagnosis not present

## 2018-01-20 DIAGNOSIS — Z0001 Encounter for general adult medical examination with abnormal findings: Secondary | ICD-10-CM | POA: Diagnosis not present

## 2018-01-20 DIAGNOSIS — I10 Essential (primary) hypertension: Secondary | ICD-10-CM | POA: Diagnosis not present

## 2018-01-20 DIAGNOSIS — E782 Mixed hyperlipidemia: Secondary | ICD-10-CM | POA: Diagnosis not present

## 2018-01-21 LAB — CBC
HEMATOCRIT: 35.4 % (ref 34.0–46.6)
Hemoglobin: 12.3 g/dL (ref 11.1–15.9)
MCH: 33.2 pg — AB (ref 26.6–33.0)
MCHC: 34.7 g/dL (ref 31.5–35.7)
MCV: 95 fL (ref 79–97)
PLATELETS: 132 10*3/uL — AB (ref 150–450)
RBC: 3.71 x10E6/uL — AB (ref 3.77–5.28)
RDW: 12.7 % (ref 12.3–15.4)
WBC: 2.5 10*3/uL — AB (ref 3.4–10.8)

## 2018-01-21 LAB — COMPREHENSIVE METABOLIC PANEL
ALBUMIN: 3.8 g/dL (ref 3.5–4.8)
ALK PHOS: 99 IU/L (ref 39–117)
ALT: 24 IU/L (ref 0–32)
AST: 34 IU/L (ref 0–40)
Albumin/Globulin Ratio: 1.3 (ref 1.2–2.2)
BUN / CREAT RATIO: 25 (ref 12–28)
BUN: 19 mg/dL (ref 8–27)
Bilirubin Total: 0.7 mg/dL (ref 0.0–1.2)
CALCIUM: 9.3 mg/dL (ref 8.7–10.3)
CO2: 26 mmol/L (ref 20–29)
CREATININE: 0.75 mg/dL (ref 0.57–1.00)
Chloride: 96 mmol/L (ref 96–106)
GFR calc Af Amer: 88 mL/min/{1.73_m2} (ref >59)
GFR, EST NON AFRICAN AMERICAN: 76 mL/min/{1.73_m2} (ref >59)
GLOBULIN, TOTAL: 2.9 g/dL (ref 1.5–4.5)
GLUCOSE: 121 mg/dL — AB (ref 65–99)
Potassium: 3.9 mmol/L (ref 3.5–5.2)
SODIUM: 135 mmol/L (ref 134–144)
Total Protein: 6.7 g/dL (ref 6.0–8.5)

## 2018-01-21 LAB — T4, FREE: FREE T4: 2.05 ng/dL — AB (ref 0.82–1.77)

## 2018-01-21 LAB — VITAMIN D 25 HYDROXY (VIT D DEFICIENCY, FRACTURES): VIT D 25 HYDROXY: 27.2 ng/mL — AB (ref 30.0–100.0)

## 2018-01-21 LAB — LIPID PANEL W/O CHOL/HDL RATIO
Cholesterol, Total: 100 mg/dL (ref 100–199)
HDL: 43 mg/dL (ref >39)
LDL Calculated: 39 mg/dL (ref 0–99)
Triglycerides: 92 mg/dL (ref 0–149)
VLDL CHOLESTEROL CAL: 18 mg/dL (ref 5–40)

## 2018-01-21 LAB — TSH: TSH: 0.199 u[IU]/mL — AB (ref 0.450–4.500)

## 2018-01-24 ENCOUNTER — Ambulatory Visit (INDEPENDENT_AMBULATORY_CARE_PROVIDER_SITE_OTHER): Payer: Medicare HMO | Admitting: Nurse Practitioner

## 2018-01-24 ENCOUNTER — Encounter: Payer: Self-pay | Admitting: Nurse Practitioner

## 2018-01-24 VITALS — BP 128/72 | HR 63 | Resp 16 | Ht 63.0 in | Wt 205.4 lb

## 2018-01-24 DIAGNOSIS — R300 Dysuria: Secondary | ICD-10-CM

## 2018-01-24 DIAGNOSIS — I10 Essential (primary) hypertension: Secondary | ICD-10-CM | POA: Diagnosis not present

## 2018-01-24 DIAGNOSIS — M064 Inflammatory polyarthropathy: Secondary | ICD-10-CM

## 2018-01-24 DIAGNOSIS — Z0001 Encounter for general adult medical examination with abnormal findings: Secondary | ICD-10-CM | POA: Diagnosis not present

## 2018-01-24 DIAGNOSIS — Z23 Encounter for immunization: Secondary | ICD-10-CM

## 2018-01-24 MED ORDER — TRAMADOL HCL 50 MG PO TABS: 50.0000 mg | ORAL_TABLET | Freq: Three times a day (TID) | ORAL | 1 refills | Status: DC | PRN

## 2018-01-24 MED ORDER — OXYCODONE HCL 5 MG PO TABS: 5.0000 mg | ORAL_TABLET | ORAL | 0 refills | Status: DC | PRN

## 2018-01-24 MED ORDER — ZOSTER VAC RECOMB ADJUVANTED 50 MCG/0.5ML IM SUSR: INTRAMUSCULAR | 1 refills | Status: DC

## 2018-01-24 NOTE — Progress Notes (Signed)
[facility] [place], [place] [address]  Internal MEDICINE  Office Visit Note  Patient Name: Martha Peterson  [id number]  [id number]  Date of Service: 01/24/2018   Pt is here for routine health maintenance examination  Chief Complaint  Patient presents with  . Annual Exam    4 month annual  . Hypertension  . Osteoarthritis     The patient reports increased stress levels. Her husband has been in the hospital with CHF. 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 Surgery is more risky than letting it be. She is not leaving him alone, which this is keeping her from doing much outside the home.  She has seen new rheumatologist she was last seen. Is now taking tumeric and ginger supplement  X-rays done of her hands which show significant arthritis with some spurring in her fingers and hands. She is doing well and will go back to rheumatologist in October for follow up.   Hypertension  This is a chronic problem. The current episode started more than 1 year ago. The problem is unchanged. The problem is controlled. Associated symptoms include headaches, palpitations and peripheral edema. Pertinent negatives include no chest pain or shortness of breath. Agents associated with hypertension include NSAIDs and thyroid hormones. Risk factors for coronary artery disease include dyslipidemia, post-menopausal state and stress. Past treatments include beta blockers, ACE inhibitors and diuretics. The current treatment provides moderate improvement. Compliance problems include exercise.  Identifiable causes of hypertension include a thyroid problem.     Current Medication: Outpatient Encounter Medications as of 01/24/2018  Medication Sig Note  . allopurinol (ZYLOPRIM) 100 MG tablet Take 1 tablet (100 mg total) by mouth daily.   Marland Kitchen ascorbic acid (VITAMIN C) 500 MG tablet Take 500 mg by mouth daily.   Marland Kitchen aspirin EC 81 MG tablet Take 81 mg by mouth daily.   Marland Kitchen atorvastatin (LIPITOR)  10 MG tablet Take 1 tablet (10 mg total) by mouth daily at 6 PM.   . carvedilol (COREG) 25 MG tablet Take 0.5 tablets (12.5 mg total) by mouth 2 (two) times daily with a meal.   . cholecalciferol (VITAMIN D) 400 UNITS TABS tablet Take 1,000 Units by mouth.   . cloNIDine (CATAPRES) 0.1 MG tablet Take 0.1 mg by mouth daily.   . diclofenac sodium (VOLTAREN) 1 % GEL Apply 3 grams to 3 large joints, up to 3 times daily as needed.   . furosemide (LASIX) 40 MG tablet Take one tablet by mouth two times daily   . Ginger, Zingiber officinalis, (GINGER PO) Take by mouth.   Marland Kitchen ibuprofen (ADVIL,MOTRIN) 600 MG tablet Take 1 tablet (600 mg total) by mouth every 8 (eight) hours as needed.   Marland Kitchen levocetirizine (XYZAL) 5 MG tablet TAKE 1 TABLET EVERY DAY  FOR  ALLERGIES   . levothyroxine (SYNTHROID, LEVOTHROID) 150 MCG tablet Take 1 tablet (150 mcg total) by mouth daily before breakfast.   . losartan-hydrochlorothiazide (HYZAAR) 100-25 MG per tablet Take 1 tablet by mouth daily.   . magnesium oxide (MAG-OX) 400 MG tablet Take 400 mg by mouth daily.  08/20/2015: Received from: External Pharmacy  . meclizine (ANTIVERT) 12.5 MG tablet Take 12.5 mg by mouth as needed.    Marland Kitchen omega-3 acid ethyl esters (LOVAZA) 1 G capsule Take 1 g by mouth 2 (two) times daily.   Marland Kitchen omeprazole (PRILOSEC) 20 MG capsule Take one capsule by mouth two times daily   . oxyCODONE (OXY IR/ROXICODONE) 5 MG immediate release  tablet Take 1 tablet (5 mg total) by mouth every 4 (four) hours as needed for severe pain.   . potassium chloride (K-DUR) 10 MEQ tablet Take 1 tablet (10 mEq total) by mouth daily.   . Potassium Gluconate 595 MG CAPS Take by mouth.   . traMADol (ULTRAM) 50 MG tablet Take 1 tablet (50 mg total) by mouth every 8 (eight) hours as needed.   . vitamin E 400 UNIT capsule Take 400 Units by mouth 2 (two) times daily.  08/20/2015: Received from: External Pharmacy  . Vitamin E 400 units TABS    . [DISCONTINUED] oxyCODONE (OXY IR/ROXICODONE) 5  MG immediate release tablet Take 1 tablet (5 mg total) by mouth every 4 (four) hours as needed for severe pain.   . [DISCONTINUED] traMADol (ULTRAM) 50 MG tablet Take by mouth every 8 (eight) hours as needed.   . predniSONE (STERAPRED UNI-PAK 21 TAB) 10 MG (21) TBPK tablet 6 day taper - take by mouth as directed for 6 days (Patient not taking: Reported on 12/24/2017)   . Zoster Vaccine Adjuvanted [facility]) injection Shingles vaccine series - inject IM as directed .    No facility-administered encounter medications on file as of 01/24/2018.     Surgical History: Past Surgical History:  Procedure Laterality Date  . ABDOMINAL HYSTERECTOMY    . ANKLE SURGERY Left   . APPENDECTOMY    . CHOLECYSTECTOMY    . COLONOSCOPY WITH PROPOFOL N/A 12/04/2014   Procedure: COLONOSCOPY WITH PROPOFOL;  Surgeon: Lollie Sails, MD;  Location: [facility] ENDOSCOPY;  Service: Endoscopy;  Laterality: N/A;  . FRACTURE SURGERY     Femur Fx; LT Ankle Pinning  . JOINT REPLACEMENT     RT TKR  . Patial Thyroid Resection      Medical History: Past Medical History:  Diagnosis Date  . Arthritis   . COPD (chronic obstructive pulmonary disease) ([place])   . Diverticulitis   . Gout   . Hyperlipidemia   . Hypertension   . Hypothyroidism   . 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 Microhematuria   . Mild mitral regurgitation   . Mild tricuspid regurgitation   . Osteopenia   . Pulmonary fibrosis ([place])   . Urinary incontinence     Family History: Family History  Problem Relation Age of Onset  . Heart Problems Mother   . Heart Problems Father   . Diabetes Father   . Lupus Sister   . Thyroid disease Sister   . Heart Problems Sister   . Hypertension Son   . Diabetes Son        borderline   . Arthritis Daughter        in the left knee   . Diabetes Daughter   . Neuropathy Daughter   . Depression Daughter   . Anxiety disorder Daughter       Review of Systems  Constitutional:  Positive for fatigue. Negative for activity change, chills and unexpected weight change.       Weight loss of 1 pound since most recent visit.   HENT: Negative for congestion, postnasal drip, sinus pressure, sinus pain and sore throat.   Eyes: Negative.   Respiratory: Negative for cough, shortness of breath and wheezing.   Cardiovascular: Positive for palpitations and leg swelling. Negative for chest pain.  Gastrointestinal: Negative for constipation, diarrhea, nausea and vomiting.  Endocrine:       Good blood sugar control   Genitourinary: Negative.   Musculoskeletal: Positive for arthralgias,  joint swelling and myalgias.       Moderate to severe pain in left hip. Improved since her most recent visit. Walking better, no longer using cane to help with mobility.   Skin: Negative for color change and rash.  Allergic/Immunologic: Positive for environmental allergies.  Neurological: Positive for headaches. Negative for dizziness.  Hematological: Negative for adenopathy. Does not bruise/bleed easily.  Psychiatric/Behavioral: Negative.      Today's Vitals   01/24/18 1115  BP: 128/72  Pulse: 63  Resp: 16  SpO2: 96%  Weight: 205 lb 6.4 oz (93.2 kg)  Height: 5\' 3"  (1.6 m)   Physical Exam  Constitutional: She is oriented to person, place, and time. She appears well-developed and well-nourished. No distress.  HENT:  Head: Normocephalic and atraumatic.  Nose: Nose normal.  Mouth/Throat: Oropharynx is clear and moist. No oropharyngeal exudate.  Eyes: Pupils are equal, round, and reactive to light. EOM are normal.  Neck: Normal range of motion. Neck supple. No JVD present. No tracheal deviation present. No thyromegaly present.  Cardiovascular: Normal rate, regular rhythm, normal heart sounds and intact distal pulses. Exam reveals no gallop and no friction rub.  No murmur heard. Moderate, non-pitting edema in left lower leg and ankle.   Pulmonary/Chest: Effort normal and breath sounds  normal. No respiratory distress. She has no wheezes. She has no rales. She exhibits no tenderness. Right breast exhibits no inverted nipple, no mass, no nipple discharge, no skin change and no tenderness. Left breast exhibits no inverted nipple, no mass, no nipple discharge, no skin change and no tenderness.  Abdominal: Soft. Bowel sounds are normal. There is no tenderness.  Musculoskeletal: Normal range of motion. She exhibits edema.  Moderate left hip tenderness. No bony deformity or abnormality noted. Patient needing to walk with limp favoring the left side.   Lymphadenopathy:    She has no cervical adenopathy.  Neurological: She is alert and oriented to person, place, and time. No cranial nerve deficit.  Skin: Skin is warm and dry. Capillary refill takes 2 to 3 seconds. She is not diaphoretic.  Psychiatric: She has a normal mood and affect. Her behavior is normal. Judgment and thought content normal.  Nursing note and vitals reviewed.    LABS: Recent Results (from the past 2160 hour(s))  Rheumatoid factor     Status: Abnormal   Collection Time: 10/28/17  2:48 PM  Result Value Ref Range   Rhuematoid fact SerPl-aCnc 23.2 (H) 0.0 - 13.9 IU/mL  Uric acid     Status: None   Collection Time: 10/28/17  2:48 PM  Result Value Ref Range   Uric Acid 6.5 2.5 - 7.1 mg/dL    Comment:            Therapeutic target for gout patients: <6.0  ANA w/Reflex if Positive     Status: None   Collection Time: 10/28/17  2:48 PM  Result Value Ref Range   Anti Nuclear Antibody(ANA) Negative Negative  Sedimentation rate     Status: None   Collection Time: 10/28/17  2:48 PM  Result Value Ref Range   Sed Rate 18 0 - 40 mm/hr   Assessment/Plan: 1. Encounter for general adult medical examination with abnormal findings Annual health maintenance exam today  2. Essential hypertension Stable. Continue bp medications as prescribed,    3. Inflammatory polyarthritis ([place]) Patient unable to tolerate PO NSAIDs.  May continue tramadol as needed and as prescribed for mild/moderate pain. Oxycodone 5mg  tablets prescribed for severe pain as  needed. New prescriptions for both were sent to her pharmacy.  - traMADol (ULTRAM) 50 MG tablet; Take 1 tablet (50 mg total) by mouth every 8 (eight) hours as needed.  Dispense: 90 tablet; Refill: 1 - oxyCODONE (OXY IR/ROXICODONE) 5 MG immediate release tablet; Take 1 tablet (5 mg total) by mouth every 4 (four) hours as needed for severe pain.  Dispense: 30 tablet; Refill: 0  4. Dysuria - UA/M w/rflx Culture, Routine  5. Need for shingles vaccine - Zoster Vaccine Adjuvanted [facility]) injection; Shingles vaccine series - inject IM as directed .  Dispense: 0.5 mL; Refill: 1  General Counseling: Cletis verbalizes understanding of the findings of todays visit and agrees with plan of treatment. I have discussed any further diagnostic evaluation that may be needed or ordered today. We also reviewed her medications today. she has been encouraged to call the office with any questions or concerns that should arise related to todays visit.    Counseling:  This patient was seen by [name] FNP Collaboration with Dr [name] as a part of collaborative care agreement  Orders Placed This Encounter  Procedures  . UA/M w/rflx Culture, Routine    Meds ordered this encounter  Medications  . traMADol (ULTRAM) 50 MG tablet    Sig: Take 1 tablet (50 mg total) by mouth every 8 (eight) hours as needed.    Dispense:  90 tablet    Refill:  1    Order Specific Question:   Supervising Provider    Answer:   [name] [[id number]]  . oxyCODONE (OXY IR/ROXICODONE) 5 MG immediate release tablet    Sig: Take 1 tablet (5 mg total) by mouth every 4 (four) hours as needed for severe pain.    Dispense:  30 tablet    Refill:  0    Order Specific Question:   Supervising Provider    Answer:   [name] [[id number]]  . Zoster Vaccine Adjuvanted [facility]) injection    Sig: Shingles  vaccine series - inject IM as directed .    Dispense:  0.5 mL    Refill:  1    Order Specific Question:   Supervising Provider    Answer:   [name] [[id number]]    Time spent: [place], MD  Internal Medicine

## 2018-01-25 LAB — UA/M W/RFLX CULTURE, ROUTINE
Bilirubin, UA: NEGATIVE
Glucose, UA: NEGATIVE
Ketones, UA: NEGATIVE
LEUKOCYTES UA: NEGATIVE
Nitrite, UA: NEGATIVE
PH UA: 7.5 (ref 5.0–7.5)
Protein, UA: NEGATIVE
RBC UA: NEGATIVE
Specific Gravity, UA: 1.01 (ref 1.005–1.030)
Urobilinogen, Ur: 0.2 mg/dL (ref 0.2–1.0)

## 2018-01-25 LAB — MICROSCOPIC EXAMINATION
CASTS: NONE SEEN /LPF
RBC, UA: NONE SEEN /hpf (ref 0–2)

## 2018-01-28 ENCOUNTER — Ambulatory Visit: Payer: Self-pay | Admitting: Rheumatology

## 2018-01-30 NOTE — Progress Notes (Signed)
Can you have patient make appointment with me to review labs? thanks

## 2018-02-01 NOTE — Progress Notes (Signed)
Patient advised and scheduled appointment

## 2018-02-04 ENCOUNTER — Encounter: Payer: Self-pay | Admitting: Nurse Practitioner

## 2018-02-04 ENCOUNTER — Ambulatory Visit (INDEPENDENT_AMBULATORY_CARE_PROVIDER_SITE_OTHER): Payer: Medicare HMO | Admitting: Nurse Practitioner

## 2018-02-04 VITALS — BP 138/58 | HR 70 | Resp 16 | Ht 63.0 in | Wt 203.8 lb

## 2018-02-04 DIAGNOSIS — D709 Neutropenia, unspecified: Secondary | ICD-10-CM

## 2018-02-04 DIAGNOSIS — E039 Hypothyroidism, unspecified: Secondary | ICD-10-CM | POA: Diagnosis not present

## 2018-02-04 DIAGNOSIS — M1612 Unilateral primary osteoarthritis, left hip: Secondary | ICD-10-CM

## 2018-02-04 DIAGNOSIS — I10 Essential (primary) hypertension: Secondary | ICD-10-CM | POA: Diagnosis not present

## 2018-02-04 MED ORDER — LEVOTHYROXINE SODIUM 137 MCG PO TABS: 137.0000 ug | ORAL_TABLET | Freq: Every day | ORAL | 2 refills | Status: DC

## 2018-02-04 NOTE — Progress Notes (Signed)
[facility] [place], [place] [address]  Internal MEDICINE  Office Visit Note  Patient Name: Martha Peterson  [id number]  [id number]  Date of Service: 02/14/2018  Chief Complaint  Patient presents with  . Labs Only    review lab results  . Fatigue    Increase levels of fatigue. Feels as though this would be normal based on increased stress levels she has been suffering. Husband recently hospitalized due to CHF and is now home. She is the primary care taker. She has had labs drawn and is here to discuss the results of the labs.       Current Medication: Outpatient Encounter Medications as of 02/04/2018  Medication Sig Note  . allopurinol (ZYLOPRIM) 100 MG tablet Take 1 tablet (100 mg total) by mouth daily.   Marland Kitchen ascorbic acid (VITAMIN C) 500 MG tablet Take 500 mg by mouth daily.   Marland Kitchen aspirin EC 81 MG tablet Take 81 mg by mouth daily.   Marland Kitchen atorvastatin (LIPITOR) 10 MG tablet Take 1 tablet (10 mg total) by mouth daily at 6 PM.   . carvedilol (COREG) 25 MG tablet Take 0.5 tablets (12.5 mg total) by mouth 2 (two) times daily with a meal.   . cholecalciferol (VITAMIN D) 400 UNITS TABS tablet Take 1,000 Units by mouth.   . cloNIDine (CATAPRES) 0.1 MG tablet Take 0.1 mg by mouth daily.   . diclofenac sodium (VOLTAREN) 1 % GEL Apply 3 grams to 3 large joints, up to 3 times daily as needed.   . furosemide (LASIX) 40 MG tablet Take one tablet by mouth two times daily   . Ginger, Zingiber officinalis, (GINGER PO) Take by mouth.   Marland Kitchen ibuprofen (ADVIL,MOTRIN) 600 MG tablet Take 1 tablet (600 mg total) by mouth every 8 (eight) hours as needed.   Marland Kitchen levocetirizine (XYZAL) 5 MG tablet TAKE 1 TABLET EVERY DAY  FOR  ALLERGIES   . levothyroxine (SYNTHROID, LEVOTHROID) 137 MCG tablet Take 1 tablet (137 mcg total) by mouth daily before breakfast.   . losartan-hydrochlorothiazide (HYZAAR) 100-25 MG per tablet Take 1 tablet by mouth daily.   . magnesium oxide (MAG-OX) 400 MG tablet  Take 400 mg by mouth daily.  08/20/2015: Received from: External Pharmacy  . meclizine (ANTIVERT) 12.5 MG tablet Take 12.5 mg by mouth as needed.    Marland Kitchen omega-3 acid ethyl esters (LOVAZA) 1 G capsule Take 1 g by mouth 2 (two) times daily.   Marland Kitchen omeprazole (PRILOSEC) 20 MG capsule Take one capsule by mouth two times daily   . oxyCODONE (OXY IR/ROXICODONE) 5 MG immediate release tablet Take 1 tablet (5 mg total) by mouth every 4 (four) hours as needed for severe pain.   . potassium chloride (K-DUR) 10 MEQ tablet Take 1 tablet (10 mEq total) by mouth daily.   . Potassium Gluconate 595 MG CAPS Take by mouth.   . traMADol (ULTRAM) 50 MG tablet Take 1 tablet (50 mg total) by mouth every 8 (eight) hours as needed.   . vitamin E 400 UNIT capsule Take 400 Units by mouth 2 (two) times daily.  08/20/2015: Received from: External Pharmacy  . Vitamin E 400 units TABS    . Zoster Vaccine Adjuvanted [facility]) injection Shingles vaccine series - inject IM as directed .   . [DISCONTINUED] levothyroxine (SYNTHROID, LEVOTHROID) 150 MCG tablet Take 1 tablet (150 mcg total) by mouth daily before breakfast.   . predniSONE (STERAPRED UNI-PAK 21 TAB) 10 MG (21) TBPK tablet  6 day taper - take by mouth as directed for 6 days (Patient not taking: Reported on 12/24/2017)    No facility-administered encounter medications on file as of 02/04/2018.     Surgical History: Past Surgical History:  Procedure Laterality Date  . ABDOMINAL HYSTERECTOMY    . ANKLE SURGERY Left   . APPENDECTOMY    . CHOLECYSTECTOMY    . COLONOSCOPY WITH PROPOFOL N/A 12/04/2014   Procedure: COLONOSCOPY WITH PROPOFOL;  Surgeon: Lollie Sails, MD;  Location: [facility] ENDOSCOPY;  Service: Endoscopy;  Laterality: N/A;  . FRACTURE SURGERY     Femur Fx; LT Ankle Pinning  . JOINT REPLACEMENT     RT TKR  . Patial Thyroid Resection      Medical History: Past Medical History:  Diagnosis Date  . Arthritis   . COPD (chronic obstructive pulmonary disease)  ([place])   . Diverticulitis   . Gout   . Hyperlipidemia   . Hypertension   . Hypothyroidism   . 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 Microhematuria   . Mild mitral regurgitation   . Mild tricuspid regurgitation   . Osteopenia   . Pulmonary fibrosis ([place])   . Urinary incontinence     Family History: Family History  Problem Relation Age of Onset  . Heart Problems Mother   . Heart Problems Father   . Diabetes Father   . Lupus Sister   . Thyroid disease Sister   . Heart Problems Sister   . Hypertension Son   . Diabetes Son        borderline   . Arthritis Daughter        in the left knee   . Diabetes Daughter   . Neuropathy Daughter   . Depression Daughter   . Anxiety disorder Daughter     Social History   Socioeconomic History  . Marital status: Married    Spouse name: Not on file  . Number of children: Not on file  . Years of education: Not on file  . Highest education level: Not on file  Occupational History  . Not on file  Social Needs  . Financial resource strain: Not on file  . Food insecurity:    Worry: Not on file    Inability: Not on file  . Transportation needs:    Medical: Not on file    Non-medical: Not on file  Tobacco Use  . Smoking status: Former Smoker    Years: 1.00    Types: Cigarettes  . Smokeless tobacco: Never Used  Substance and Sexual Activity  . Alcohol use: No  . Drug use: No  . Sexual activity: Not Currently  Lifestyle  . Physical activity:    Days per week: Not on file    Minutes per session: Not on file  . Stress: Not on file  Relationships  . Social connections:    Talks on phone: Not on file    Gets together: Not on file    Attends religious service: Not on file    Active member of club or organization: Not on file    Attends meetings of clubs or organizations: Not on file    Relationship status: Not on file  . Intimate partner violence:    Fear of current or ex partner: Not on file     Emotionally abused: Not on file    Physically abused: Not on file    Forced sexual activity: Not on file  Other Topics Concern  .  Not on file  Social History Narrative  . Not on file      Review of Systems  Constitutional: Positive for fatigue. Negative for activity change, chills and unexpected weight change.  HENT: Negative for congestion, postnasal drip, sinus pressure, sinus pain and sore throat.   Eyes: Negative.   Respiratory: Negative for cough, shortness of breath and wheezing.   Cardiovascular: Positive for palpitations and leg swelling. Negative for chest pain.  Gastrointestinal: Negative for constipation, diarrhea, nausea and vomiting.  Endocrine:       Good blood sugar control. Generally well managed hypothyroid.   Genitourinary: Negative.   Musculoskeletal: Positive for arthralgias, joint swelling and myalgias.       Moderate to severe pain in left hip. Improved since her most recent visit. Walking better, no longer using cane to help with mobility.   Skin: Negative for color change and rash.  Allergic/Immunologic: Positive for environmental allergies.  Neurological: Positive for headaches. Negative for dizziness.  Hematological: Negative for adenopathy. Does not bruise/bleed easily.  Psychiatric/Behavioral: Positive for dysphoric mood.    Today's Vitals   02/04/18 1509  BP: (!) 138/58  Pulse: 70  Resp: 16  SpO2: 92%  Weight: 203 lb 12.8 oz (92.4 kg)  Height: 5\' 3"  (1.6 m)    Physical Exam  Constitutional: She is oriented to person, place, and time. She appears well-developed and well-nourished. No distress.  HENT:  Head: Normocephalic and atraumatic.  Nose: Nose normal.  Mouth/Throat: Oropharynx is clear and moist. No oropharyngeal exudate.  Eyes: Pupils are equal, round, and reactive to light. Conjunctivae and EOM are normal.  Neck: Normal range of motion. Neck supple. No JVD present. No tracheal deviation present. No thyromegaly present.   Cardiovascular: Normal rate, regular rhythm, normal heart sounds and intact distal pulses. Exam reveals no gallop and no friction rub.  No murmur heard. Moderate, non-pitting edema in left lower leg and ankle.   Pulmonary/Chest: Effort normal and breath sounds normal. No respiratory distress. She has no wheezes. She has no rales. She exhibits no tenderness.  Abdominal: Soft. Bowel sounds are normal. There is no tenderness.  Musculoskeletal: Normal range of motion. She exhibits edema.  Moderate left hip tenderness. No bony deformity or abnormality noted. Patient needing to walk with limp favoring the left side.   Lymphadenopathy:    She has no cervical adenopathy.  Neurological: She is alert and oriented to person, place, and time. No cranial nerve deficit.  Skin: Skin is warm and dry. Capillary refill takes 2 to 3 seconds. She is not diaphoretic.  Psychiatric: She has a normal mood and affect. Her behavior is normal. Judgment and thought content normal.  Nursing note and vitals reviewed.  Assessment/Plan: 1. Hypothyroidism (acquired) Reviewed recent labs with the patient. Thyroid levels hyperactive. Reduced dose of levothyroxine to 133mcg daily. Will recheck levels prior to next visit and review then.  - levothyroxine (SYNTHROID, LEVOTHROID) 137 MCG tablet; Take 1 tablet (137 mcg total) by mouth daily before breakfast.  Dispense: 30 tablet; Refill: 2  2. Neutropenia, unspecified type (HCC) WBC 2.5. Patient currently followed per cancer center due to chronic neutropenia. Levels have been stable prior to this check. Will recheck CBC. Advised she make acute appointment with provider at cancer center for further evaluation and treatment. .   3. Essential hypertension Stable. Continue bp medication as prescribed.   4. Primary osteoarthritis of left hip She should continue to take NSAIDs and pain medication as needed and as prescribed .  General  Counseling: keiasia christianson understanding of  the findings of todays visit and agrees with plan of treatment. I have discussed any further diagnostic evaluation that may be needed or ordered today. We also reviewed her medications today. she has been encouraged to call the office with any questions or concerns that should arise related to todays visit.   This patient was seen by [place] with Dr [name] as a part of collaborative care agreement   Meds ordered this encounter  Medications  . levothyroxine (SYNTHROID, LEVOTHROID) 137 MCG tablet    Sig: Take 1 tablet (137 mcg total) by mouth daily before breakfast.    Dispense:  30 tablet    Refill:  2    Order Specific Question:   Supervising Provider    Answer:   [name] [[id number]]    Time spent: 33 Minutes      Dr [name] Internal medicine

## 2018-02-14 DIAGNOSIS — D709 Neutropenia, unspecified: Secondary | ICD-10-CM

## 2018-02-14 DIAGNOSIS — E039 Hypothyroidism, unspecified: Secondary | ICD-10-CM | POA: Insufficient documentation

## 2018-02-14 HISTORY — DX: Neutropenia, unspecified: D70.9

## 2018-03-02 ENCOUNTER — Ambulatory Visit
Admission: RE | Admit: 2018-03-02 | Discharge: 2018-03-02 | Disposition: A | Discharge status: Home / Self Care | Payer: Medicare HMO | Source: Ambulatory Visit | Attending: Gastroenterology | Admitting: Gastroenterology

## 2018-03-02 DIAGNOSIS — Z9049 Acquired absence of other specified parts of digestive tract: Secondary | ICD-10-CM | POA: Diagnosis not present

## 2018-03-02 DIAGNOSIS — K7460 Unspecified cirrhosis of liver: Secondary | ICD-10-CM

## 2018-03-04 ENCOUNTER — Other Ambulatory Visit: Payer: Self-pay | Admitting: Nurse Practitioner

## 2018-03-04 DIAGNOSIS — D709 Neutropenia, unspecified: Secondary | ICD-10-CM | POA: Diagnosis not present

## 2018-03-05 LAB — CBC WITH DIFFERENTIAL/PLATELET
Basophils Absolute: 0 10*3/uL (ref 0.0–0.2)
Basos: 1 %
EOS (ABSOLUTE): 0.2 10*3/uL (ref 0.0–0.4)
EOS: 6 %
HEMATOCRIT: 37.7 % (ref 34.0–46.6)
Hemoglobin: 13 g/dL (ref 11.1–15.9)
Immature Grans (Abs): 0 10*3/uL (ref 0.0–0.1)
Immature Granulocytes: 0 %
LYMPHS ABS: 0.9 10*3/uL (ref 0.7–3.1)
Lymphs: 26 %
MCH: 32.9 pg (ref 26.6–33.0)
MCHC: 34.5 g/dL (ref 31.5–35.7)
MCV: 95 fL (ref 79–97)
MONOS ABS: 0.5 10*3/uL (ref 0.1–0.9)
Monocytes: 13 %
Neutrophils Absolute: 1.9 10*3/uL (ref 1.4–7.0)
Neutrophils: 54 %
Platelets: 127 10*3/uL — ABNORMAL LOW (ref 150–450)
RBC: 3.95 x10E6/uL (ref 3.77–5.28)
RDW: 12.4 % (ref 12.3–15.4)
WBC: 3.6 10*3/uL (ref 3.4–10.8)

## 2018-03-08 ENCOUNTER — Telehealth: Payer: Self-pay

## 2018-03-08 NOTE — Telephone Encounter (Signed)
-----   Message from Ronnell Freshwater, NP sent at 03/07/2018  1:32 PM EDT ----- Marykay Lex. Can you let the patient know that her labs were much better. Thanks.

## 2018-03-08 NOTE — Telephone Encounter (Signed)
Tried calling pt today to give her lab results no answer. I left a voicemail asking her to call me back for the results.

## 2018-03-09 ENCOUNTER — Telehealth: Payer: Self-pay

## 2018-03-09 NOTE — Telephone Encounter (Signed)
Pt advised for labs came back better

## 2018-03-11 ENCOUNTER — Other Ambulatory Visit: Payer: Self-pay | Admitting: Nurse Practitioner

## 2018-03-11 MED ORDER — TIZANIDINE HCL 4 MG PO TABS: 4.0000 mg | ORAL_TABLET | Freq: Two times a day (BID) | ORAL | 1 refills | Status: DC

## 2018-03-11 MED ORDER — CARVEDILOL 25 MG PO TABS: 12.5000 mg | ORAL_TABLET | Freq: Two times a day (BID) | ORAL | 1 refills | Status: DC

## 2018-03-14 ENCOUNTER — Other Ambulatory Visit: Payer: Self-pay

## 2018-03-14 MED ORDER — CLONIDINE HCL 0.1 MG PO TABS: 0.1000 mg | ORAL_TABLET | Freq: Every day | ORAL | 1 refills | Status: DC

## 2018-03-18 ENCOUNTER — Inpatient Hospital Stay: Payer: Medicare HMO | Attending: Oncology

## 2018-03-18 ENCOUNTER — Ambulatory Visit (INDEPENDENT_AMBULATORY_CARE_PROVIDER_SITE_OTHER): Payer: Medicare HMO

## 2018-03-18 DIAGNOSIS — Z23 Encounter for immunization: Secondary | ICD-10-CM | POA: Diagnosis not present

## 2018-03-18 DIAGNOSIS — D472 Monoclonal gammopathy: Secondary | ICD-10-CM | POA: Insufficient documentation

## 2018-03-18 LAB — CBC WITH DIFFERENTIAL/PLATELET
BASOS ABS: 0 10*3/uL (ref 0–0.1)
BASOS PCT: 1 %
EOS ABS: 0.1 10*3/uL (ref 0–0.7)
Eosinophils Relative: 3 %
HCT: 38.4 % (ref 35.0–47.0)
Hemoglobin: 13.4 g/dL (ref 12.0–16.0)
Lymphocytes Relative: 30 %
Lymphs Abs: 1.2 10*3/uL (ref 1.0–3.6)
MCH: 33.4 pg (ref 26.0–34.0)
MCHC: 35 g/dL (ref 32.0–36.0)
MCV: 95.4 fL (ref 80.0–100.0)
MONO ABS: 0.4 10*3/uL (ref 0.2–0.9)
Monocytes Relative: 9 %
Neutro Abs: 2.2 10*3/uL (ref 1.4–6.5)
Neutrophils Relative %: 57 %
PLATELETS: 154 10*3/uL (ref 150–440)
RBC: 4.02 MIL/uL (ref 3.80–5.20)
RDW: 13.4 % (ref 11.5–14.5)
WBC: 3.9 10*3/uL (ref 3.6–11.0)

## 2018-03-18 LAB — BASIC METABOLIC PANEL
Anion gap: 11 (ref 5–15)
BUN: 21 mg/dL (ref 8–23)
CALCIUM: 10 mg/dL (ref 8.9–10.3)
CO2: 28 mmol/L (ref 22–32)
CREATININE: 0.84 mg/dL (ref 0.44–1.00)
Chloride: 97 mmol/L — ABNORMAL LOW (ref 98–111)
GFR calc Af Amer: >60 mL/min (ref >60)
GFR calc non Af Amer: >60 mL/min (ref >60)
GLUCOSE: 108 mg/dL — AB (ref 70–99)
Potassium: 3.2 mmol/L — ABNORMAL LOW (ref 3.5–5.1)
SODIUM: 136 mmol/L (ref 135–145)

## 2018-03-19 LAB — IGG, IGA, IGM
IgA: 518 mg/dL — ABNORMAL HIGH (ref 64–422)
IgG (Immunoglobin G), Serum: 1579 mg/dL (ref 700–1600)
IgM (Immunoglobulin M), Srm: 67 mg/dL (ref 26–217)

## 2018-03-21 ENCOUNTER — Other Ambulatory Visit: Payer: Medicare HMO

## 2018-03-21 LAB — PROTEIN ELECTROPHORESIS, SERUM
A/G Ratio: 1.1 (ref 0.7–1.7)
ALPHA-2-GLOBULIN: 0.7 g/dL (ref 0.4–1.0)
Albumin ELP: 4 g/dL (ref 2.9–4.4)
Alpha-1-Globulin: 0.2 g/dL (ref 0.0–0.4)
Beta Globulin: 0.9 g/dL (ref 0.7–1.3)
GLOBULIN, TOTAL: 3.5 g/dL (ref 2.2–3.9)
Gamma Globulin: 1.7 g/dL (ref 0.4–1.8)
M-Spike, %: 0.4 g/dL — ABNORMAL HIGH
Total Protein ELP: 7.5 g/dL (ref 6.0–8.5)

## 2018-03-21 LAB — KAPPA/LAMBDA LIGHT CHAINS
KAPPA FREE LGHT CHN: 42.2 mg/L — AB (ref 3.3–19.4)
KAPPA, LAMDA LIGHT CHAIN RATIO: 1.36 (ref 0.26–1.65)
LAMDA FREE LIGHT CHAINS: 31.1 mg/L — AB (ref 5.7–26.3)

## 2018-03-30 ENCOUNTER — Ambulatory Visit: Payer: Self-pay | Admitting: Rheumatology

## 2018-04-04 DIAGNOSIS — M8580 Other specified disorders of bone density and structure, unspecified site: Secondary | ICD-10-CM | POA: Insufficient documentation

## 2018-04-04 NOTE — Progress Notes (Signed)
Office Visit Note  Patient: Martha Peterson             Date of Birth: September 07, 1940           MRN: [id number]             PCP: [name], MD Referring: [name], MD Visit Date: 04/06/2018 Occupation: @GUAROCC @  Subjective:  Pain in both hands.   History of Present Illness: Martha Peterson is a 77 y.o. female with history of osteoarthritis and gout.  Patient states that she started taking some natural anti-inflammatories and also taking allopurinol on a regular basis now.  She is experience less pain and discomfort.  She continues to have some discomfort in her bilateral CMC joints.  She also has some discomfort in her knee joints due to underlying osteoarthritis.  Activities of Daily Living:  Patient reports morning stiffness for several hours.   Patient Denies nocturnal pain.  Difficulty dressing/grooming: Denies Difficulty climbing stairs: Reports Difficulty getting out of chair: Denies Difficulty using hands for taps, buttons, cutlery, and/or writing: Reports  Review of Systems  Constitutional: Positive for fatigue.  HENT: Negative for mouth sores, trouble swallowing, trouble swallowing and mouth dryness.   Eyes: Positive for itching and dryness. Negative for pain and redness.  Respiratory: Negative for shortness of breath, wheezing and difficulty breathing.   Cardiovascular: Negative for chest pain, palpitations and swelling in legs/feet.  Gastrointestinal: Negative for abdominal pain, constipation, diarrhea, nausea and vomiting.  Endocrine: Negative for increased urination.  Genitourinary: Negative for painful urination, nocturia and pelvic pain.  Musculoskeletal: Positive for arthralgias, joint pain and morning stiffness. Negative for joint swelling.  Skin: Negative for rash and hair loss.  Allergic/Immunologic: Negative for susceptible to infections.  Neurological: Negative for dizziness, light-headedness, headaches, memory loss and weakness.  Hematological:  Negative for bruising/bleeding tendency.  Psychiatric/Behavioral: Negative for confusion. The patient is not nervous/anxious.     PMFS History:  Patient Active Problem List   Diagnosis Date Noted  . Osteopenia 04/04/2018  . Hypothyroidism (acquired) 02/14/2018  . Neutropenia ([place]) 02/14/2018  . Primary osteoarthritis of left hip 12/06/2017  . Essential hypertension 09/29/2017  . Postural dizziness 09/29/2017  . 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 Left ventricular hypertrophy 09/29/2017  . Inflammatory polyarthritis ([place]) 09/29/2017  . Need for vaccination for Strep pneumoniae 09/29/2017  . 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 Abnormal LFTs 02/13/2014  . 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 Primary osteoarthritis of both hands 02/13/2014  . H/O: gout 02/13/2014  . Rheumatoid factor positive 02/13/2014    Past Medical History:  Diagnosis Date  . Arthritis   . COPD (chronic obstructive pulmonary disease) ([place])   . Diverticulitis   . Gout   . Hyperlipidemia   . Hypertension   . Hypothyroidism   . 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 Microhematuria   . Mild mitral regurgitation   . Mild tricuspid regurgitation   . Osteopenia   . Pulmonary fibrosis ([place])   . Urinary incontinence     Family History  Problem Relation Age of Onset  . Heart Problems Mother   . Heart Problems Father   . Diabetes Father   . Lupus Sister   . Thyroid disease Sister   . Heart Problems Sister   . Hypertension Son   . Diabetes Son        borderline   . Arthritis Daughter  in the left knee   . Diabetes Daughter   . Neuropathy Daughter   . Depression Daughter   . Anxiety disorder Daughter    Past Surgical History:  Procedure Laterality Date  . ABDOMINAL HYSTERECTOMY    . ANKLE SURGERY Left   . APPENDECTOMY    . CHOLECYSTECTOMY    . COLONOSCOPY WITH PROPOFOL N/A 12/04/2014    Procedure: COLONOSCOPY WITH PROPOFOL;  Surgeon: Lollie Sails, MD;  Location: [facility] ENDOSCOPY;  Service: Endoscopy;  Laterality: N/A;  . FRACTURE SURGERY     Femur Fx; LT Ankle Pinning  . JOINT REPLACEMENT     RT TKR  . Patial Thyroid Resection     Social History   Social History Narrative  . Not on file    Objective: Vital Signs: BP (!) 142/68 (BP Location: Left Arm, Patient Position: Sitting, Cuff Size: Large)   Pulse 76   Resp 14   Ht 5' 2.6" (1.59 m)   Wt 208 lb 6.4 oz (94.5 kg)   BMI 37.39 kg/m    Physical Exam  Constitutional: She is oriented to person, place, and time. She appears well-developed and well-nourished.  HENT:  Head: Normocephalic and atraumatic.  Eyes: Conjunctivae and EOM are normal.  Neck: Normal range of motion.  Cardiovascular: Normal rate, regular rhythm, normal heart sounds and intact distal pulses.  Pulmonary/Chest: Effort normal and breath sounds normal.  Abdominal: Soft. Bowel sounds are normal.  Musculoskeletal: She exhibits edema.  Lymphadenopathy:    She has no cervical adenopathy.  Neurological: She is alert and oriented to person, place, and time.  Skin: Skin is warm and dry. Capillary refill takes less than 2 seconds.  Psychiatric: She has a normal mood and affect. Her behavior is normal.  Nursing note and vitals reviewed.    Musculoskeletal Exam: C-spine thoracic lumbar spine good range of motion.  Shoulder joints and elbow joints were in good range of motion.  She has DIP and PIP thickening in her hands and CMC thickening bilaterally.  No synovitis was noted.  She has some discomfort range of motion of the left hip.  She has severe osteoarthritis in her bilateral knee joints with limited extension.  She also has some in her left ankle joint.   CDAI Exam: CDAI Score: Not documented Patient Global Assessment: Not documented; Provider Global Assessment: Not documented Swollen: Not documented; Tender: Not documented Joint Exam   Not  documented   There is currently no information documented on the homunculus. Go to the Rheumatology activity and complete the homunculus joint exam.  Investigation: No additional findings.  Imaging: No results found.  Recent Labs: Lab Results  Component Value Date   WBC 3.9 03/18/2018   HGB 13.4 03/18/2018   PLT 154 03/18/2018   NA 136 03/18/2018   K 3.2 (L) 03/18/2018   CL 97 (L) 03/18/2018   CO2 28 03/18/2018   GLUCOSE 108 (H) 03/18/2018   BUN 21 03/18/2018   CREATININE 0.84 03/18/2018   BILITOT 0.7 01/20/2018   ALKPHOS 99 01/20/2018   AST 34 01/20/2018   ALT 24 01/20/2018   PROT 6.7 01/20/2018   ALBUMIN 3.8 01/20/2018   CALCIUM 10.0 03/18/2018   GFRAA >60 03/18/2018    Speciality Comments: No specialty comments available.  Procedures:  No procedures performed Allergies: Codeine and Levaquin [levofloxacin]   Assessment / Plan:     Visit Diagnoses: Primary osteoarthritis of both hands - Bilateral severe CMC arthritis.  I offered [place] brace which she declined.  Joint protection muscle strengthening was discussed.  Rheumatoid factor positive - RF 23.2 on Oct 28, 2017.  Patient had no synovitis on examination.  Primary osteoarthritis of left hip-she is currently not having much discomfort.  Primary osteoarthritis of both knees-I do not have x-ray of her knee joints.  Patient states that she has severe end-stage osteoarthritis but she is not ready for total knee replacement.  History of gout - Patient was advised to take allopurinol on regular basis during the last visit.  She has been taking allopurinol on regular basis.  She has noticed less discomfort in her joints.  Osteopenia, unspecified location-she has been taking calcium and vitamin D.  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  No orders of the defined types were placed in this encounter.     Follow-Up Instructions: Return in about 6 months (around 10/06/2018) for Osteoarthritis, Gout.   Bo Merino, MD  Note - This record has been created using Editor, commissioning.  Chart creation errors have been sought, but may not always  have been located. Such creation errors do not reflect on  the standard of medical care.

## 2018-04-05 ENCOUNTER — Other Ambulatory Visit: Payer: Self-pay

## 2018-04-05 MED ORDER — LOSARTAN POTASSIUM-HCTZ 100-25 MG PO TABS: 1.0000 | ORAL_TABLET | Freq: Every day | ORAL | 1 refills | Status: DC

## 2018-04-06 ENCOUNTER — Ambulatory Visit: Payer: Medicare HMO | Admitting: Rheumatology

## 2018-04-06 ENCOUNTER — Encounter: Payer: Self-pay | Admitting: Rheumatology

## 2018-04-06 VITALS — BP 142/68 | HR 76 | Resp 14 | Ht 62.6 in | Wt 208.4 lb

## 2018-04-06 DIAGNOSIS — M8580 Other specified disorders of bone density and structure, unspecified site: Secondary | ICD-10-CM

## 2018-04-06 DIAGNOSIS — M19042 Primary osteoarthritis, left hand: Secondary | ICD-10-CM | POA: Diagnosis not present

## 2018-04-06 DIAGNOSIS — I517 Cardiomegaly: Secondary | ICD-10-CM | POA: Diagnosis not present

## 2018-04-06 DIAGNOSIS — Z8739 Personal history of other diseases of the musculoskeletal system and connective tissue: Secondary | ICD-10-CM | POA: Diagnosis not present

## 2018-04-06 DIAGNOSIS — M1612 Unilateral primary osteoarthritis, left hip: Secondary | ICD-10-CM | POA: Diagnosis not present

## 2018-04-06 DIAGNOSIS — D472 Monoclonal gammopathy: Secondary | ICD-10-CM

## 2018-04-06 DIAGNOSIS — R768 Other specified abnormal immunological findings in serum: Secondary | ICD-10-CM

## 2018-04-06 DIAGNOSIS — M170 Bilateral primary osteoarthritis of knee: Secondary | ICD-10-CM

## 2018-04-06 DIAGNOSIS — M19041 Primary osteoarthritis, right hand: Secondary | ICD-10-CM

## 2018-04-26 ENCOUNTER — Encounter: Payer: Self-pay | Admitting: Nurse Practitioner

## 2018-04-26 ENCOUNTER — Ambulatory Visit (INDEPENDENT_AMBULATORY_CARE_PROVIDER_SITE_OTHER): Payer: Medicare HMO | Admitting: Nurse Practitioner

## 2018-04-26 VITALS — BP 128/61 | HR 67 | Resp 16 | Ht 62.0 in | Wt 199.6 lb

## 2018-04-26 DIAGNOSIS — E876 Hypokalemia: Secondary | ICD-10-CM

## 2018-04-26 DIAGNOSIS — I6523 Occlusion and stenosis of bilateral carotid arteries: Secondary | ICD-10-CM | POA: Diagnosis not present

## 2018-04-26 DIAGNOSIS — I517 Cardiomegaly: Secondary | ICD-10-CM | POA: Diagnosis not present

## 2018-04-26 DIAGNOSIS — M064 Inflammatory polyarthropathy: Secondary | ICD-10-CM

## 2018-04-26 DIAGNOSIS — M1612 Unilateral primary osteoarthritis, left hip: Secondary | ICD-10-CM

## 2018-04-26 MED ORDER — OXYCODONE HCL 5 MG PO TABS: 5.0000 mg | ORAL_TABLET | ORAL | 0 refills | Status: DC | PRN

## 2018-04-26 MED ORDER — IBUPROFEN 600 MG PO TABS: 600.0000 mg | ORAL_TABLET | Freq: Three times a day (TID) | ORAL | 1 refills | Status: DC | PRN

## 2018-04-26 NOTE — Progress Notes (Signed)
[facility] [place], [place] [address]  Internal MEDICINE  Office Visit Note  Patient Name: Martha Peterson  [id number]  [id number]  Date of Service: 04/27/2018  Chief Complaint  Patient presents with  . Medical Management of Chronic Issues    3 month follow up  . Hypothyroidism  . Hypertension    The patient is here for routine follow up exam. She reports feeling well overall and presents with no new concerns or complaints. Reduced dosing of levothyroxine at her last visit. She reports some increased energyl levels and improved activity levels. Does need to have tyroid panel rechecked. Will readjust levothyroxine dose as indicated.  The patient has history of carotid artery stenosis. Last carotid ultrasound done 07/2017. Did show <50% stenosis on right side and 50-60% stenosis on the left. Will repeat carotid doppler for further evaluation.  Her last echo was done 07/2017, showing mild to moderate LVH and moderate mitral valve regurgitation. Will also repeat echo prior to next visit for further evaluation.  Hypertension  This is a chronic problem. The current episode started more than 1 year ago. The problem is unchanged. The problem is controlled. Associated symptoms include headaches and palpitations. Pertinent negatives include no chest pain or shortness of breath. Agents associated with hypertension include NSAIDs and thyroid hormones. Risk factors for coronary artery disease include dyslipidemia and post-menopausal state. Past treatments include beta blockers, diuretics, central alpha agonists and ACE inhibitors. The current treatment provides moderate improvement. Compliance problems include exercise.  Hypertensive end-organ damage includes left ventricular hypertrophy.       Current Medication: Outpatient Encounter Medications as of 04/26/2018  Medication Sig Note  . allopurinol (ZYLOPRIM) 100 MG tablet Take 1 tablet (100 mg total) by mouth daily.    Marland Kitchen ascorbic acid (VITAMIN C) 500 MG tablet Take 500 mg by mouth daily.   Marland Kitchen aspirin EC 81 MG tablet Take 81 mg by mouth daily.   Marland Kitchen atorvastatin (LIPITOR) 10 MG tablet Take 1 tablet (10 mg total) by mouth daily at 6 PM.   . carvedilol (COREG) 25 MG tablet Take 0.5 tablets (12.5 mg total) by mouth 2 (two) times daily with a meal.   . cholecalciferol (VITAMIN D) 400 UNITS TABS tablet Take 1,000 Units by mouth.   . cloNIDine (CATAPRES) 0.1 MG tablet Take 1 tablet (0.1 mg total) by mouth daily.   . diclofenac sodium (VOLTAREN) 1 % GEL Apply 3 grams to 3 large joints, up to 3 times daily as needed.   . furosemide (LASIX) 40 MG tablet Take one tablet by mouth two times daily   . Ginger, Zingiber officinalis, (GINGER PO) Take by mouth.   Marland Kitchen ibuprofen (ADVIL,MOTRIN) 600 MG tablet Take 1 tablet (600 mg total) by mouth every 8 (eight) hours as needed.   Marland Kitchen levocetirizine (XYZAL) 5 MG tablet TAKE 1 TABLET EVERY DAY  FOR  ALLERGIES   . levothyroxine (SYNTHROID, LEVOTHROID) 137 MCG tablet Take 1 tablet (137 mcg total) by mouth daily before breakfast.   . losartan-hydrochlorothiazide (HYZAAR) 100-25 MG tablet Take 1 tablet by mouth daily.   . magnesium oxide (MAG-OX) 400 MG tablet Take 400 mg by mouth daily.  08/20/2015: Received from: External Pharmacy  . meclizine (ANTIVERT) 12.5 MG tablet Take 12.5 mg by mouth as needed.    Marland Kitchen omega-3 acid ethyl esters (LOVAZA) 1 G capsule Take 1 g by mouth 2 (two) times daily.   Marland Kitchen omeprazole (PRILOSEC) 20 MG capsule Take one capsule by mouth  two times daily   . oxyCODONE (OXY IR/ROXICODONE) 5 MG immediate release tablet Take 1 tablet (5 mg total) by mouth every 4 (four) hours as needed for severe pain.   . potassium chloride (K-DUR) 10 MEQ tablet Take 1 tablet (10 mEq total) by mouth daily.   . Potassium Gluconate 595 MG CAPS Take by mouth.   . traMADol (ULTRAM) 50 MG tablet Take 1 tablet (50 mg total) by mouth every 8 (eight) hours as needed.   . vitamin B-12 (CYANOCOBALAMIN)  100 MCG tablet    . vitamin E 400 UNIT capsule Take 400 Units by mouth 2 (two) times daily.  08/20/2015: Received from: External Pharmacy  . Vitamin E 400 units TABS    . [DISCONTINUED] ibuprofen (ADVIL,MOTRIN) 600 MG tablet Take 1 tablet (600 mg total) by mouth every 8 (eight) hours as needed.   . [DISCONTINUED] oxyCODONE (OXY IR/ROXICODONE) 5 MG immediate release tablet Take 1 tablet (5 mg total) by mouth every 4 (four) hours as needed for severe pain.   Marland Kitchen tiZANidine (ZANAFLEX) 4 MG tablet Take 1 tablet (4 mg total) by mouth 2 (two) times daily. (Patient not taking: Reported on 04/06/2018)   . Zoster Vaccine Adjuvanted [facility]) injection Shingles vaccine series - inject IM as directed . (Patient not taking: Reported on 04/26/2018)   . [DISCONTINUED] predniSONE (STERAPRED UNI-PAK 21 TAB) 10 MG (21) TBPK tablet 6 day taper - take by mouth as directed for 6 days (Patient not taking: Reported on 12/24/2017)    No facility-administered encounter medications on file as of 04/26/2018.     Surgical History: Past Surgical History:  Procedure Laterality Date  . ABDOMINAL HYSTERECTOMY    . ANKLE SURGERY Left   . APPENDECTOMY    . CHOLECYSTECTOMY    . COLONOSCOPY WITH PROPOFOL N/A 12/04/2014   Procedure: COLONOSCOPY WITH PROPOFOL;  Surgeon: Lollie Sails, MD;  Location: [facility] ENDOSCOPY;  Service: Endoscopy;  Laterality: N/A;  . FRACTURE SURGERY     Femur Fx; LT Ankle Pinning  . JOINT REPLACEMENT     RT TKR  . Patial Thyroid Resection      Medical History: Past Medical History:  Diagnosis Date  . Arthritis   . COPD (chronic obstructive pulmonary disease) ([place])   . Diverticulitis   . Gout   . Hyperlipidemia   . Hypertension   . Hypothyroidism   . 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 Microhematuria   . Mild mitral regurgitation   . Mild tricuspid regurgitation   . Osteopenia   . Pulmonary fibrosis ([place])   . Urinary incontinence     Family  History: Family History  Problem Relation Age of Onset  . Heart Problems Mother   . Heart Problems Father   . Diabetes Father   . Lupus Sister   . Thyroid disease Sister   . Heart Problems Sister   . Hypertension Son   . Diabetes Son        borderline   . Arthritis Daughter        in the left knee   . Diabetes Daughter   . Neuropathy Daughter   . Depression Daughter   . Anxiety disorder Daughter     Social History   Socioeconomic History  . Marital status: Married    Spouse name: Not on file  . Number of children: Not on file  . Years of education: Not on file  . Highest education level: Not on file  Occupational History  .  Not on file  Social Needs  . Financial resource strain: Not on file  . Food insecurity:    Worry: Not on file    Inability: Not on file  . Transportation needs:    Medical: Not on file    Non-medical: Not on file  Tobacco Use  . Smoking status: Former Smoker    Years: 1.00    Types: Cigarettes  . Smokeless tobacco: Never Used  Substance and Sexual Activity  . Alcohol use: No  . Drug use: No  . Sexual activity: Not Currently  Lifestyle  . Physical activity:    Days per week: Not on file    Minutes per session: Not on file  . Stress: Not on file  Relationships  . Social connections:    Talks on phone: Not on file    Gets together: Not on file    Attends religious service: Not on file    Active member of club or organization: Not on file    Attends meetings of clubs or organizations: Not on file    Relationship status: Not on file  . Intimate partner violence:    Fear of current or ex partner: Not on file    Emotionally abused: Not on file    Physically abused: Not on file    Forced sexual activity: Not on file  Other Topics Concern  . Not on file  Social History Narrative  . Not on file      Review of Systems  Constitutional: Positive for fatigue. Negative for activity change, chills and unexpected weight change.        Improved fatigue since last visit   HENT: Negative for congestion, postnasal drip, sinus pressure, sinus pain and sore throat.   Eyes: Negative.   Respiratory: Negative for cough, shortness of breath and wheezing.   Cardiovascular: Positive for palpitations and leg swelling. Negative for chest pain.  Gastrointestinal: Negative for constipation, diarrhea, nausea and vomiting.  Endocrine:       Good blood sugar control   Genitourinary: Negative.   Musculoskeletal: Positive for arthralgias, joint swelling and myalgias.       Moderate to severe pain in left hip. Improved since her most recent visit. Walking better, no longer using cane to help with mobility.   Skin: Negative for color change and rash.  Allergic/Immunologic: Positive for environmental allergies.  Neurological: Positive for headaches. Negative for dizziness.  Hematological: Negative for adenopathy. Does not bruise/bleed easily.  Psychiatric/Behavioral: Negative for dysphoric mood. The patient is not nervous/anxious.       Physical Exam  Constitutional: She is oriented to person, place, and time. She appears well-developed and well-nourished. No distress.  HENT:  Head: Normocephalic and atraumatic.  Mouth/Throat: No oropharyngeal exudate.  Eyes: Pupils are equal, round, and reactive to light. EOM are normal.  Neck: Normal range of motion. Neck supple. No JVD present. No tracheal deviation present. No thyromegaly present.  Cardiovascular: Normal rate and regular rhythm. Exam reveals no gallop and no friction rub.  Murmur heard.  Systolic murmur is present with a grade of 2/6. Moderate, non-pitting edema in left lower leg and ankle.   Pulmonary/Chest: Effort normal and breath sounds normal. No respiratory distress. She has no wheezes. She has no rales. She exhibits no tenderness.  Abdominal: Soft. Bowel sounds are normal. There is no tenderness.  Musculoskeletal: Normal range of motion. She exhibits edema.  Moderate left hip  tenderness. No bony deformity or abnormality noted. Patient needing to walk with  limp favoring the left side.   Lymphadenopathy:    She has no cervical adenopathy.  Neurological: She is alert and oriented to person, place, and time. No cranial nerve deficit.  Skin: Skin is warm and dry. Capillary refill takes 2 to 3 seconds. She is not diaphoretic.  Psychiatric: She has a normal mood and affect. Her behavior is normal. Judgment and thought content normal.  Nursing note and vitals reviewed.  Assessment/Plan:  1. 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 Will repeat carotid doppler prior to next visit for continued evaluation.  - US Carotid Duplex Bilateral; Future  2. Acquired left ventricular hypertrophy Prior echo showingmild to moderate LVH and moderate mitral valve regurgitation. Will repeat echocardiogram prior to next visit for continued evaluation.  - ECHOCARDIOGRAM COMPLETE; Future  3. Primary osteoarthritis of left hip May continue to take ibuprofen 600mg  as needed and as prescribed to reduce pain and inflammation.  - ibuprofen (ADVIL,MOTRIN) 600 MG tablet; Take 1 tablet (600 mg total) by mouth every 8 (eight) hours as needed.  Dispense: 90 tablet; Refill: 1  4. Inflammatory polyarthritis ([place]) May use oxycodone 5mg  as needed for severe arthritic pain. Used rarely and generally at night only. New prescription for #30 tablets was sent to her pharmacy. She may also use tramadol as needed and as prescribed for less severe pain, not controlled by ibuprofen.  - oxyCODONE (OXY IR/ROXICODONE) 5 MG immediate release tablet; Take 1 tablet (5 mg total) by mouth every 4 (four) hours as needed for severe pain.  Dispense: 30 tablet; Refill: 0  5. Hypokalemia Advised her to increase potassium dosing to twice daily. Recheck potassium level and adjust dosing as  indicated.   Hypertension Counseling:   The following hypertensive lifestyle modification were recommended and discussed:  1. Limiting alcohol intake to less than 1 oz/day of ethanol:(24 oz of beer or 8 oz of wine or 2 oz of 100-proof whiskey). 2. Take baby ASA 81 mg daily. 3. Importance of regular aerobic exercise and losing weight. 4. Reduce dietary saturated fat and cholesterol intake for overall cardiovascular health. 5. Maintaining adequate dietary potassium, calcium, and magnesium intake. 6. Regular monitoring of the blood pressure. 7. Reduce sodium intake to less than 100 mmol/day (less than 2.3 gm of sodium or less than 6 gm of sodium choride)  General Counseling: K[place]ista verbalizes understanding of the findings of todays visit and agrees with plan of treatment. I have discussed any further diagnostic evaluation that may be needed or ordered today. We also reviewed her medications today. she has been encouraged to call the office with any questions or concerns that should arise related to todays visit.  This patient was seen by [name] FNP Collaboration with Dr [name] as a part of collaborative care agreement  Orders Placed This Encounter  Procedures  . US Carotid Duplex Bilateral  . ECHOCARDIOGRAM COMPLETE    Meds ordered this encounter  Medications  . ibuprofen (ADVIL,MOTRIN) 600 MG tablet    Sig: Take 1 tablet (600 mg total) by mouth every 8 (eight) hours as needed.    Dispense:  90 tablet    Refill:  1    Order Specific Question:   Supervising Provider    Answer:   [name] [[id number]]  . oxyCODONE (OXY IR/ROXICODONE) 5 MG immediate release tablet    Sig: Take 1 tablet (5 mg total)  by mouth every 4 (four) hours as needed for severe pain.    Dispense:  30 tablet    Refill:  0    Order Specific Question:   Supervising Provider    Answer:   [name] [[id number]]    Time spent: 49 Minutes      Dr [name] Internal medicine

## 2018-04-27 DIAGNOSIS — E876 Hypokalemia: Secondary | ICD-10-CM | POA: Insufficient documentation

## 2018-05-04 ENCOUNTER — Other Ambulatory Visit: Payer: Self-pay

## 2018-05-04 DIAGNOSIS — E039 Hypothyroidism, unspecified: Secondary | ICD-10-CM

## 2018-05-04 MED ORDER — LEVOTHYROXINE SODIUM 137 MCG PO TABS: 137.0000 ug | ORAL_TABLET | Freq: Every day | ORAL | 1 refills | Status: DC

## 2018-05-06 ENCOUNTER — Other Ambulatory Visit: Payer: Self-pay

## 2018-05-06 DIAGNOSIS — E039 Hypothyroidism, unspecified: Secondary | ICD-10-CM

## 2018-05-06 MED ORDER — LEVOTHYROXINE SODIUM 137 MCG PO TABS: 137.0000 ug | ORAL_TABLET | Freq: Every day | ORAL | 1 refills | Status: DC

## 2018-05-06 MED ORDER — LEVOCETIRIZINE DIHYDROCHLORIDE 5 MG PO TABS: ORAL_TABLET | ORAL | 1 refills | Status: DC

## 2018-05-16 ENCOUNTER — Other Ambulatory Visit: Payer: Self-pay

## 2018-05-17 ENCOUNTER — Other Ambulatory Visit: Payer: Self-pay

## 2018-05-17 MED ORDER — PREDNISONE 10 MG (21) PO TBPK: ORAL_TABLET | ORAL | 0 refills | Status: DC

## 2018-05-17 MED ORDER — TIZANIDINE HCL 4 MG PO TABS: 4.0000 mg | ORAL_TABLET | Freq: Two times a day (BID) | ORAL | 1 refills | Status: DC

## 2018-05-17 NOTE — Telephone Encounter (Signed)
Pt called her hip pain and she is unable to  Come in as per heather send prednisone taper if not feeling  Better need to been seen

## 2018-05-20 ENCOUNTER — Ambulatory Visit: Payer: Medicare HMO

## 2018-05-20 DIAGNOSIS — I6523 Occlusion and stenosis of bilateral carotid arteries: Secondary | ICD-10-CM | POA: Diagnosis not present

## 2018-05-24 ENCOUNTER — Other Ambulatory Visit: Payer: Self-pay

## 2018-05-24 MED ORDER — OMEPRAZOLE 20 MG PO CPDR: DELAYED_RELEASE_CAPSULE | ORAL | 1 refills | Status: DC

## 2018-05-24 MED ORDER — POTASSIUM CHLORIDE ER 10 MEQ PO TBCR: 10.0000 meq | EXTENDED_RELEASE_TABLET | Freq: Every day | ORAL | 1 refills | Status: DC

## 2018-05-24 MED ORDER — FUROSEMIDE 40 MG PO TABS: ORAL_TABLET | ORAL | 1 refills | Status: DC

## 2018-05-27 ENCOUNTER — Other Ambulatory Visit: Payer: Self-pay

## 2018-06-03 ENCOUNTER — Telehealth: Payer: Self-pay

## 2018-06-03 ENCOUNTER — Other Ambulatory Visit: Payer: Self-pay | Admitting: Nurse Practitioner

## 2018-06-03 ENCOUNTER — Ambulatory Visit: Payer: Medicare HMO

## 2018-06-03 DIAGNOSIS — I517 Cardiomegaly: Secondary | ICD-10-CM

## 2018-06-03 DIAGNOSIS — M1612 Unilateral primary osteoarthritis, left hip: Secondary | ICD-10-CM

## 2018-06-03 DIAGNOSIS — R011 Cardiac murmur, unspecified: Secondary | ICD-10-CM | POA: Diagnosis not present

## 2018-06-03 MED ORDER — PREDNISONE 10 MG (21) PO TBPK: ORAL_TABLET | ORAL | 0 refills | Status: DC

## 2018-06-03 NOTE — Progress Notes (Signed)
Due to persistent hip pain, sent in prednisone dose pack to reduce pain and inflammation. Sent to [facility].

## 2018-06-03 NOTE — Telephone Encounter (Signed)
lmom we send med

## 2018-06-03 NOTE — Telephone Encounter (Signed)
Due to persistent hip pain, sent in prednisone dose pack to reduce pain and inflammation. Sent to [facility].

## 2018-06-13 NOTE — Procedures (Signed)
[place], [place] [id number]  DATE OF SERVICE: May 20, 2018  CAROTID DOPPLER INTERPRETATION:  Bilateral Carotid Ultrsasound and Color Doppler Examination was performed. The RIGHT CCA shows mild plaque in the vessel. The LEFT CCA shows mild plaque in the vessel. There was no intimal thickening noted in the RIGHT carotid artery. There was no intimal thickening in the LEFT carotid artery.  The RIGHT CCA shows peak systolic velocity of 26 cm per second. The end diastolic velocity is 14 cm per second on the RIGHT side. The RIGHT ICA shows peak systolic velocity of 54 per second. RIGHT sided ICA end diastolic velocity is 37 cm per second. The RIGHT ECA shows a peak systolic velocity of 18 cm per second. The ICA/CCA ratio is calculated to be 2.0. This suggests less than 50% stenosis. The Vertebral Artery shows antegrade flow.  The LEFT CCA shows peak systolic velocity of 87 cm per second. The end diastolic velocity is 17 cm per second on the LEFT side. The LEFT ICA shows peak systolic velocity of 338 per second. LEFT sided ICA end diastolic velocity is 58 cm per second. The LEFT ECA shows a peak systolic velocity of 81 cm per second. The ICA/CCA ratio is calculated to be 1.85. This suggests 50 to 69% stenosis. The Vertebral Artery shows antegrade flow.   Impression:    The RIGHT CAROTID shows less than 50% stenosis. The LEFT CAROTID shows 50 to 69% stenosis.  There is mild plaque formation noted on the LEFT and mild on the RIGHT  side. Consider a repeat Carotid doppler if clinical situation and symptoms warrant in 6-12 months. Patient should be encouraged to change lifestyles such as smoking cessation, regular exercise and dietary modification. Use of statins in the right clinical setting and ASA is encouraged.  Allyne Gee, MD [facility] Pulmonary Critical Care Medicine

## 2018-06-27 ENCOUNTER — Telehealth: Payer: Self-pay

## 2018-06-27 ENCOUNTER — Other Ambulatory Visit: Payer: Self-pay | Admitting: Nurse Practitioner

## 2018-06-27 DIAGNOSIS — M064 Inflammatory polyarthropathy: Secondary | ICD-10-CM

## 2018-06-27 MED ORDER — TRAMADOL HCL 50 MG PO TABS: 50.0000 mg | ORAL_TABLET | Freq: Three times a day (TID) | ORAL | 1 refills | Status: DC | PRN

## 2018-06-27 NOTE — Progress Notes (Signed)
Sent 30 day prescription for patient's tramadol to [facility] [address].

## 2018-06-27 NOTE — Telephone Encounter (Signed)
Sent 30 day prescription for patient's tramadol to [facility] [address].

## 2018-06-27 NOTE — Telephone Encounter (Signed)
Pt advised send 30 days next refill at follow up

## 2018-07-25 ENCOUNTER — Other Ambulatory Visit: Payer: Self-pay | Admitting: Nurse Practitioner

## 2018-07-25 DIAGNOSIS — E039 Hypothyroidism, unspecified: Secondary | ICD-10-CM | POA: Diagnosis not present

## 2018-07-25 DIAGNOSIS — E876 Hypokalemia: Secondary | ICD-10-CM | POA: Diagnosis not present

## 2018-07-26 LAB — BASIC METABOLIC PANEL
BUN/Creatinine Ratio: 23 (ref 12–28)
BUN: 22 mg/dL (ref 8–27)
CO2: 29 mmol/L (ref 20–29)
Calcium: 10.5 mg/dL — ABNORMAL HIGH (ref 8.7–10.3)
Chloride: 95 mmol/L — ABNORMAL LOW (ref 96–106)
Creatinine, Ser: 0.95 mg/dL (ref 0.57–1.00)
GFR calc Af Amer: 66 mL/min/{1.73_m2} (ref >59)
GFR calc non Af Amer: 57 mL/min/{1.73_m2} — ABNORMAL LOW (ref >59)
Glucose: 117 mg/dL — ABNORMAL HIGH (ref 65–99)
Potassium: 5.1 mmol/L (ref 3.5–5.2)
Sodium: 139 mmol/L (ref 134–144)

## 2018-07-26 LAB — T4, FREE: Free T4: 1.8 ng/dL — ABNORMAL HIGH (ref 0.82–1.77)

## 2018-07-26 LAB — TSH: TSH: 2.21 u[IU]/mL (ref 0.450–4.500)

## 2018-08-01 ENCOUNTER — Other Ambulatory Visit: Payer: Self-pay

## 2018-08-01 ENCOUNTER — Ambulatory Visit: Payer: Self-pay | Admitting: Nurse Practitioner

## 2018-08-01 MED ORDER — CARVEDILOL 25 MG PO TABS: 12.5000 mg | ORAL_TABLET | Freq: Two times a day (BID) | ORAL | 1 refills | Status: DC

## 2018-08-08 ENCOUNTER — Other Ambulatory Visit: Payer: Self-pay

## 2018-08-08 ENCOUNTER — Ambulatory Visit (INDEPENDENT_AMBULATORY_CARE_PROVIDER_SITE_OTHER): Payer: Medicare HMO | Admitting: Adult Health

## 2018-08-08 ENCOUNTER — Encounter: Payer: Self-pay | Admitting: Adult Health

## 2018-08-08 VITALS — BP 148/70 | HR 73 | Resp 16 | Ht 63.0 in | Wt 200.0 lb

## 2018-08-08 DIAGNOSIS — M064 Inflammatory polyarthropathy: Secondary | ICD-10-CM | POA: Diagnosis not present

## 2018-08-08 DIAGNOSIS — J0100 Acute maxillary sinusitis, unspecified: Secondary | ICD-10-CM

## 2018-08-08 DIAGNOSIS — E039 Hypothyroidism, unspecified: Secondary | ICD-10-CM

## 2018-08-08 DIAGNOSIS — I10 Essential (primary) hypertension: Secondary | ICD-10-CM

## 2018-08-08 DIAGNOSIS — E785 Hyperlipidemia, unspecified: Secondary | ICD-10-CM

## 2018-08-08 DIAGNOSIS — G4733 Obstructive sleep apnea (adult) (pediatric): Secondary | ICD-10-CM

## 2018-08-08 MED ORDER — AMOXICILLIN-POT CLAVULANATE 875-125 MG PO TABS: 1.0000 | ORAL_TABLET | Freq: Two times a day (BID) | ORAL | 0 refills | Status: DC

## 2018-08-08 MED ORDER — PREDNISONE 10 MG PO TABS: 10.0000 mg | ORAL_TABLET | Freq: Every day | ORAL | 0 refills | Status: DC

## 2018-08-08 MED ORDER — OXYCODONE HCL 5 MG PO TABS: 5.0000 mg | ORAL_TABLET | ORAL | 0 refills | Status: DC | PRN

## 2018-08-08 NOTE — Patient Instructions (Signed)

## 2018-08-08 NOTE — Progress Notes (Signed)
[facility] [place], [place] [address]  Internal MEDICINE  Office Visit Note  Patient Name: Martha Peterson  [id number]  [id number]  Date of Service: 08/08/2018  Chief Complaint  Patient presents with  . Hypothyroidism  . Hypertension  . Hyperlipidemia    HPI Pt is here for follow up on hypothyroid, HTN, and HLD.  Overall she is doing well however, she is complaining of symptoms of a sinus infection today.  She has been having PND, sinus pressure, denies fever, but reports some chills at times.  She also reports some face pain below her eyes.      Current Medication: Outpatient Encounter Medications as of 08/08/2018  Medication Sig Note  . allopurinol (ZYLOPRIM) 100 MG tablet Take 1 tablet (100 mg total) by mouth daily.   Marland Kitchen ascorbic acid (VITAMIN C) 500 MG tablet Take 500 mg by mouth daily.   Marland Kitchen aspirin EC 81 MG tablet Take 81 mg by mouth daily.   Marland Kitchen atorvastatin (LIPITOR) 10 MG tablet Take 1 tablet (10 mg total) by mouth daily at 6 PM.   . carvedilol (COREG) 25 MG tablet Take 0.5 tablets (12.5 mg total) by mouth 2 (two) times daily with a meal.   . cholecalciferol (VITAMIN D) 400 UNITS TABS tablet Take 1,000 Units by mouth.   . cloNIDine (CATAPRES) 0.1 MG tablet Take 1 tablet (0.1 mg total) by mouth daily.   . diclofenac sodium (VOLTAREN) 1 % GEL Apply 3 grams to 3 large joints, up to 3 times daily as needed.   . furosemide (LASIX) 40 MG tablet Take one tablet by mouth two times daily   . Ginger, Zingiber officinalis, (GINGER PO) Take by mouth.   Marland Kitchen ibuprofen (ADVIL,MOTRIN) 600 MG tablet Take 1 tablet (600 mg total) by mouth every 8 (eight) hours as needed.   Marland Kitchen levocetirizine (XYZAL) 5 MG tablet TAKE 1 TABLET EVERY DAY  FOR  ALLERGIES   . levothyroxine (SYNTHROID, LEVOTHROID) 137 MCG tablet Take 1 tablet (137 mcg total) by mouth daily before breakfast.   . losartan-hydrochlorothiazide (HYZAAR) 100-25 MG tablet Take 1 tablet by mouth daily.   . magnesium  oxide (MAG-OX) 400 MG tablet Take 400 mg by mouth daily.  08/20/2015: Received from: External Pharmacy  . meclizine (ANTIVERT) 12.5 MG tablet Take 12.5 mg by mouth as needed.    Marland Kitchen omega-3 acid ethyl esters (LOVAZA) 1 G capsule Take 1 g by mouth 2 (two) times daily.   Marland Kitchen omeprazole (PRILOSEC) 20 MG capsule Take one capsule by mouth two times daily   . oxyCODONE (OXY IR/ROXICODONE) 5 MG immediate release tablet Take 1 tablet (5 mg total) by mouth every 4 (four) hours as needed for severe pain.   . potassium chloride (K-DUR) 10 MEQ tablet Take 1 tablet (10 mEq total) by mouth daily.   . Potassium Gluconate 595 MG CAPS Take by mouth.   Marland Kitchen tiZANidine (ZANAFLEX) 4 MG tablet Take 1 tablet (4 mg total) by mouth 2 (two) times daily.   . traMADol (ULTRAM) 50 MG tablet Take 1 tablet (50 mg total) by mouth every 8 (eight) hours as needed.   . vitamin B-12 (CYANOCOBALAMIN) 100 MCG tablet    . vitamin E 400 UNIT capsule Take 400 Units by mouth 2 (two) times daily.  08/20/2015: Received from: External Pharmacy  . Vitamin E 400 units TABS    . Zoster Vaccine Adjuvanted [facility]) injection Shingles vaccine series - inject IM as directed .   Marland Kitchen [  DISCONTINUED] oxyCODONE (OXY IR/ROXICODONE) 5 MG immediate release tablet Take 1 tablet (5 mg total) by mouth every 4 (four) hours as needed for severe pain.   Marland Kitchen amoxicillin-clavulanate (AUGMENTIN) 875-125 MG tablet Take 1 tablet by mouth 2 (two) times daily.   . predniSONE (DELTASONE) 10 MG tablet Take 1 tablet (10 mg total) by mouth daily with breakfast.   . [DISCONTINUED] predniSONE (STERAPRED UNI-PAK 21 TAB) 10 MG (21) TBPK tablet Use as directed for 6days (Patient not taking: Reported on 08/08/2018)    No facility-administered encounter medications on file as of 08/08/2018.     Surgical History: Past Surgical History:  Procedure Laterality Date  . ABDOMINAL HYSTERECTOMY    . ANKLE SURGERY Left   . APPENDECTOMY    . CHOLECYSTECTOMY    . COLONOSCOPY WITH PROPOFOL N/A  12/04/2014   Procedure: COLONOSCOPY WITH PROPOFOL;  Surgeon: Lollie Sails, MD;  Location: [facility] ENDOSCOPY;  Service: Endoscopy;  Laterality: N/A;  . FRACTURE SURGERY     Femur Fx; LT Ankle Pinning  . JOINT REPLACEMENT     RT TKR  . Patial Thyroid Resection      Medical History: Past Medical History:  Diagnosis Date  . Arthritis   . COPD (chronic obstructive pulmonary disease) ([place])   . Diverticulitis   . Gout   . Hyperlipidemia   . Hypertension   . Hypothyroidism   . 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 Microhematuria   . Mild mitral regurgitation   . Mild tricuspid regurgitation   . Osteopenia   . Pulmonary fibrosis ([place])   . Urinary incontinence     Family History: Family History  Problem Relation Age of Onset  . Heart Problems Mother   . Heart Problems Father   . Diabetes Father   . Lupus Sister   . Thyroid disease Sister   . Heart Problems Sister   . Hypertension Son   . Diabetes Son        borderline   . Arthritis Daughter        in the left knee   . Diabetes Daughter   . Neuropathy Daughter   . Depression Daughter   . Anxiety disorder Daughter     Social History   Socioeconomic History  . Marital status: Married    Spouse name: Not on file  . Number of children: Not on file  . Years of education: Not on file  . Highest education level: Not on file  Occupational History  . Not on file  Social Needs  . Financial resource strain: Not on file  . Food insecurity:    Worry: Not on file    Inability: Not on file  . Transportation needs:    Medical: Not on file    Non-medical: Not on file  Tobacco Use  . Smoking status: Former Smoker    Years: 1.00    Types: Cigarettes  . Smokeless tobacco: Never Used  Substance and Sexual Activity  . Alcohol use: No  . Drug use: No  . Sexual activity: Not Currently  Lifestyle  . Physical activity:    Days per week: Not on file    Minutes per session: Not on file  . Stress:  Not on file  Relationships  . Social connections:    Talks on phone: Not on file    Gets together: Not on file    Attends religious service: Not on file    Active member of club or organization: Not  on file    Attends meetings of clubs or organizations: Not on file    Relationship status: Not on file  . Intimate partner violence:    Fear of current or ex partner: Not on file    Emotionally abused: Not on file    Physically abused: Not on file    Forced sexual activity: Not on file  Other Topics Concern  . Not on file  Social History Narrative  . Not on file      Review of Systems  Constitutional: Negative for chills, fatigue and unexpected weight change.  HENT: Negative for congestion, rhinorrhea, sneezing and sore throat.   Eyes: Negative for photophobia, pain and redness.  Respiratory: Negative for cough, chest tightness and shortness of breath.   Cardiovascular: Negative for chest pain and palpitations.  Gastrointestinal: Negative for abdominal pain, constipation, diarrhea, nausea and vomiting.  Endocrine: Negative.   Genitourinary: Negative for dysuria and frequency.  Musculoskeletal: Negative for arthralgias, back pain, joint swelling and neck pain.  Skin: Negative for rash.  Allergic/Immunologic: Negative.   Neurological: Negative for tremors and numbness.  Hematological: Negative for adenopathy. Does not bruise/bleed easily.  Psychiatric/Behavioral: Negative for behavioral problems and sleep disturbance. The patient is not nervous/anxious.     Vital Signs: BP (!) 148/70   Pulse 73   Resp 16   Ht 5\' 3"  (1.6 m)   Wt 200 lb (90.7 kg)   SpO2 94%   BMI 35.43 kg/m    Physical Exam Vitals signs and nursing note reviewed.  Constitutional:      General: She is not in acute distress.    Appearance: She is well-developed. She is not diaphoretic.  HENT:     Head: Normocephalic and atraumatic.     Mouth/Throat:     Pharynx: No oropharyngeal exudate.  Eyes:      Pupils: Pupils are equal, round, and reactive to light.  Neck:     Musculoskeletal: Normal range of motion and neck supple.     Thyroid: No thyromegaly.     Vascular: No JVD.     Trachea: No tracheal deviation.  Cardiovascular:     Rate and Rhythm: Normal rate and regular rhythm.     Heart sounds: Normal heart sounds. No murmur. No friction rub. No gallop.   Pulmonary:     Effort: Pulmonary effort is normal. No respiratory distress.     Breath sounds: Normal breath sounds. No wheezing or rales.  Chest:     Chest wall: No tenderness.  Abdominal:     Palpations: Abdomen is soft.     Tenderness: There is no abdominal tenderness. There is no guarding.  Musculoskeletal: Normal range of motion.  Lymphadenopathy:     Cervical: No cervical adenopathy.  Skin:    General: Skin is warm and dry.  Neurological:     Mental Status: She is alert and oriented to person, place, and time.     Cranial Nerves: No cranial nerve deficit.  Psychiatric:        Behavior: Behavior normal.        Thought Content: Thought content normal.        Judgment: Judgment normal.   Assessment/Plan: 1. Acute non-recurrent maxillary sinusitis Patient prescribed a course of Augmentin as well as prednisone taper for her sinus infection.  Instructed to take all medications as prescribed.  She will return to clinic in 7 to 10 days if symptoms fail to improve. - amoxicillin-clavulanate (AUGMENTIN) 875-125 MG tablet; Take 1 tablet  by mouth 2 (two) times daily.  Dispense: 14 tablet; Refill: 0 - predniSONE (DELTASONE) 10 MG tablet; Take 1 tablet (10 mg total) by mouth daily with breakfast.  Dispense: 30 tablet; Refill: 0  2. Essential hypertension Patient's blood pressure slightly elevated 148/70 today.  We will continue to follow future visits to encourage patient continue medication as prescribed.  3. Hypothyroidism (acquired) Most recent TSH and free T4 have improved patient continue current dose of levothyroxine.  4.  Hyperlipidemia, unspecified hyperlipidemia type Patient's last lipid panel within normal limits.  Repeat at next physical.  Continue current medication as prescribed.  5. OSA (obstructive sleep apnea) Stable, continue to wear CPAP as prescribed.  6. Inflammatory polyarthritis ([place]) Refill patient's oxycodone for her arthritis. - oxyCODONE (OXY IR/ROXICODONE) 5 MG immediate release tablet; Take 1 tablet (5 mg total) by mouth every 4 (four) hours as needed for severe pain.  Dispense: 30 tablet; Refill: 0  General Counseling: Vasilisa verbalizes understanding of the findings of todays visit and agrees with plan of treatment. I have discussed any further diagnostic evaluation that may be needed or ordered today. We also reviewed her medications today. she has been encouraged to call the office with any questions or concerns that should arise related to todays visit.    No orders of the defined types were placed in this encounter.   Meds ordered this encounter  Medications  . oxyCODONE (OXY IR/ROXICODONE) 5 MG immediate release tablet    Sig: Take 1 tablet (5 mg total) by mouth every 4 (four) hours as needed for severe pain.    Dispense:  30 tablet    Refill:  0  . amoxicillin-clavulanate (AUGMENTIN) 875-125 MG tablet    Sig: Take 1 tablet by mouth 2 (two) times daily.    Dispense:  14 tablet    Refill:  0  . predniSONE (DELTASONE) 10 MG tablet    Sig: Take 1 tablet (10 mg total) by mouth daily with breakfast.    Dispense:  30 tablet    Refill:  0    Time spent: 25 Minutes   This patient was seen by Orson Gear AGNP-C in Collaboration with Dr [name] as a part of collaborative care agreement     Kendell Bane AGNP-C Internal medicine

## 2018-08-18 ENCOUNTER — Other Ambulatory Visit: Payer: Self-pay | Admitting: Adult Health

## 2018-08-18 DIAGNOSIS — Z1239 Encounter for other screening for malignant neoplasm of breast: Secondary | ICD-10-CM

## 2018-08-18 NOTE — Progress Notes (Signed)
Order for mammogram

## 2018-09-21 ENCOUNTER — Other Ambulatory Visit: Payer: Self-pay

## 2018-09-21 MED ORDER — LEVOCETIRIZINE DIHYDROCHLORIDE 5 MG PO TABS: ORAL_TABLET | ORAL | 0 refills | Status: DC

## 2018-09-28 ENCOUNTER — Other Ambulatory Visit: Payer: Medicare HMO

## 2018-09-30 NOTE — Progress Notes (Signed)
Virtual Visit via Telephone Note  I connected with Martha Peterson on 09/30/18 at 11:15 AM EDT by telephone and verified that I am speaking with the correct person using two identifiers.   I discussed the limitations, risks, security and privacy concerns of performing an evaluation and management service by telephone and the availability of in person appointments. I also discussed with the patient that there may be a patient responsible charge related to this service. The patient expressed understanding and agreed to proceed.  This service was conducted via virtual visit.   The patient was located at home. I was located in my office.  Consent was obtained prior to the virtual visit and is aware of possible charges through their insurance for this visit.  The patient is an established patient.  Dr. Estanislado Pandy, MD conducted the virtual visit and Hazel Sams, PA-C acted as scribed during the service.  Office staff helped with scheduling follow up visits after the service was conducted.   CC: left knee joint pain   History of Present Illness: Patient is a 78 year old female with a past medical history of osteoarthritis and gout. She has occasional pain in both hands. She has chronic left knee joint pain but denies any joint swelling.  She does not want to proceed with a knee replacement at this time. She uses a cane to help with mobility.  Her right knee replacement is doing well. She has not had any recent falls. She is taking tart cherry, fish oil, ginger root, and tumeric which have helped resolve some of her inflammation.  She denies any gout flares.  She continues to take allopurinol as prescribed.    Review of Systems  Constitutional: Positive for malaise/fatigue. Negative for fever.  Eyes: Negative for photophobia, pain, discharge and redness.       +eye dryness  Respiratory: Negative for cough, shortness of breath and wheezing.   Cardiovascular: Negative for chest pain and palpitations.   Gastrointestinal: Negative for blood in stool, constipation and diarrhea.  Genitourinary: Negative for dysuria.  Musculoskeletal: Positive for joint pain. Negative for back pain, myalgias and neck pain.  Skin: Negative for rash.  Neurological: Negative for dizziness and headaches.  Psychiatric/Behavioral: Negative for depression. The patient is not nervous/anxious and does not have insomnia.      Observations/Objective: Physical Exam  Constitutional: She is oriented to person, place, and time.  Neurological: She is alert and oriented to person, place, and time.  Psychiatric: Mood, memory, affect and judgment normal.   Patient reports morning stiffness for 2  hours.   Patient denies nocturnal pain.  Difficulty dressing/grooming: Denies Difficulty climbing stairs: Reports Difficulty getting out of chair: Denies Difficulty using hands for taps, buttons, cutlery, and/or writing: Denies  Assessment and Plan: Primary osteoarthritis of both hands -She has intermittent pain in both hands.  She has no joint swelling.  She has severe CMC joint arthritis. She has complete fist formation.  She has no difficulty with ADLs.  She declined a prescription for bilateral  CMC joint braces in the past. She is taking tart cherry, ginger, tumeric, and fish oil on a daily basis.  She has noticed significant since introducing these natural anti-inflammatories.  She will notify us if she develops increased joint pain or joint swelling.  She will follow up in 6 months.   Rheumatoid factor positive - RF 23.2 on Oct 28, 2017.    Primary osteoarthritis of left hip-She has no discomfort at this time.  Primary osteoarthritis of left knee joint: Chronic pain.  She experiences significant discomfort and stiffness.  She has difficulty going up and down steps.  She uses a cane to help with mobility.  She does not want to proceed with a knee replacement.   S/p right knee replacement: Doing well.   History of gout -  She is taking allopurinol 100 mg po daily.   Osteopenia, unspecified location-She has been taking calcium and vitamin D.  Follow Up Instructions: She will follow up in 6 months.    I discussed the assessment and treatment plan with the patient. The patient was provided an opportunity to ask questions and all were answered. The patient agreed with the plan and demonstrated an understanding of the instructions.   The patient was advised to call back or seek an in-person evaluation if the symptoms worsen or if the condition fails to improve as anticipated.  I provided 25 minutes of non-face-to-face time during this encounter. Bo Merino, MD   Scribed by-  Ofilia Neas, PA-C

## 2018-10-03 ENCOUNTER — Ambulatory Visit: Payer: Medicare HMO | Admitting: Oncology

## 2018-10-04 ENCOUNTER — Other Ambulatory Visit: Payer: Self-pay | Admitting: Nurse Practitioner

## 2018-10-04 MED ORDER — OMEPRAZOLE 20 MG PO CPDR: DELAYED_RELEASE_CAPSULE | ORAL | 1 refills | Status: DC

## 2018-10-04 MED ORDER — POTASSIUM CHLORIDE ER 10 MEQ PO TBCR: 10.0000 meq | EXTENDED_RELEASE_TABLET | Freq: Every day | ORAL | 1 refills | Status: DC

## 2018-10-04 MED ORDER — FUROSEMIDE 40 MG PO TABS: ORAL_TABLET | ORAL | 1 refills | Status: DC

## 2018-10-05 ENCOUNTER — Ambulatory Visit: Payer: Medicare HMO | Admitting: Oncology

## 2018-10-05 ENCOUNTER — Other Ambulatory Visit: Payer: Self-pay | Admitting: Internal Medicine

## 2018-10-05 MED ORDER — POTASSIUM CHLORIDE ER 10 MEQ PO TBCR: 10.0000 meq | EXTENDED_RELEASE_TABLET | Freq: Every day | ORAL | 1 refills | Status: DC

## 2018-10-06 ENCOUNTER — Encounter: Payer: Self-pay | Admitting: Rheumatology

## 2018-10-06 ENCOUNTER — Other Ambulatory Visit: Payer: Self-pay

## 2018-10-06 ENCOUNTER — Telehealth (INDEPENDENT_AMBULATORY_CARE_PROVIDER_SITE_OTHER): Payer: Medicare HMO | Admitting: Rheumatology

## 2018-10-06 DIAGNOSIS — M1712 Unilateral primary osteoarthritis, left knee: Secondary | ICD-10-CM

## 2018-10-06 DIAGNOSIS — R768 Other specified abnormal immunological findings in serum: Secondary | ICD-10-CM

## 2018-10-06 DIAGNOSIS — Z8639 Personal history of other endocrine, nutritional and metabolic disease: Secondary | ICD-10-CM

## 2018-10-06 DIAGNOSIS — D472 Monoclonal gammopathy: Secondary | ICD-10-CM

## 2018-10-06 DIAGNOSIS — Z96651 Presence of right artificial knee joint: Secondary | ICD-10-CM | POA: Diagnosis not present

## 2018-10-06 DIAGNOSIS — I6523 Occlusion and stenosis of bilateral carotid arteries: Secondary | ICD-10-CM

## 2018-10-06 DIAGNOSIS — I10 Essential (primary) hypertension: Secondary | ICD-10-CM

## 2018-10-06 DIAGNOSIS — Z8739 Personal history of other diseases of the musculoskeletal system and connective tissue: Secondary | ICD-10-CM | POA: Diagnosis not present

## 2018-10-06 DIAGNOSIS — M8580 Other specified disorders of bone density and structure, unspecified site: Secondary | ICD-10-CM | POA: Diagnosis not present

## 2018-10-06 DIAGNOSIS — I517 Cardiomegaly: Secondary | ICD-10-CM

## 2018-10-06 DIAGNOSIS — M19042 Primary osteoarthritis, left hand: Secondary | ICD-10-CM | POA: Diagnosis not present

## 2018-10-06 DIAGNOSIS — M1612 Unilateral primary osteoarthritis, left hip: Secondary | ICD-10-CM

## 2018-10-06 DIAGNOSIS — Z8709 Personal history of other diseases of the respiratory system: Secondary | ICD-10-CM

## 2018-10-06 DIAGNOSIS — M19041 Primary osteoarthritis, right hand: Secondary | ICD-10-CM | POA: Diagnosis not present

## 2018-10-06 DIAGNOSIS — R7689 Other specified abnormal immunological findings in serum: Secondary | ICD-10-CM

## 2018-10-13 ENCOUNTER — Other Ambulatory Visit: Payer: Self-pay

## 2018-10-13 MED ORDER — ALLOPURINOL 100 MG PO TABS: 100.0000 mg | ORAL_TABLET | Freq: Every day | ORAL | 1 refills | Status: DC

## 2018-10-18 ENCOUNTER — Other Ambulatory Visit: Payer: Self-pay

## 2018-10-18 ENCOUNTER — Inpatient Hospital Stay: Payer: Medicare HMO | Attending: Oncology

## 2018-10-18 DIAGNOSIS — D472 Monoclonal gammopathy: Secondary | ICD-10-CM | POA: Diagnosis not present

## 2018-10-18 LAB — CBC WITH DIFFERENTIAL/PLATELET
Abs Immature Granulocytes: 0.01 10*3/uL (ref 0.00–0.07)
Basophils Absolute: 0 10*3/uL (ref 0.0–0.1)
Basophils Relative: 1 %
Eosinophils Absolute: 0.1 10*3/uL (ref 0.0–0.5)
Eosinophils Relative: 2 %
HCT: 37.4 % (ref 36.0–46.0)
Hemoglobin: 13 g/dL (ref 12.0–15.0)
Immature Granulocytes: 0 %
Lymphocytes Relative: 19 %
Lymphs Abs: 0.7 10*3/uL (ref 0.7–4.0)
MCH: 33.2 pg (ref 26.0–34.0)
MCHC: 34.8 g/dL (ref 30.0–36.0)
MCV: 95.7 fL (ref 80.0–100.0)
Monocytes Absolute: 0.3 10*3/uL (ref 0.1–1.0)
Monocytes Relative: 8 %
Neutro Abs: 2.7 10*3/uL (ref 1.7–7.7)
Neutrophils Relative %: 70 %
Platelets: 142 10*3/uL — ABNORMAL LOW (ref 150–400)
RBC: 3.91 MIL/uL (ref 3.87–5.11)
RDW: 12.3 % (ref 11.5–15.5)
WBC: 3.9 10*3/uL — ABNORMAL LOW (ref 4.0–10.5)
nRBC: 0 % (ref 0.0–0.2)

## 2018-10-18 LAB — BASIC METABOLIC PANEL
Anion gap: 8 (ref 5–15)
BUN: 21 mg/dL (ref 8–23)
CO2: 30 mmol/L (ref 22–32)
Calcium: 9.8 mg/dL (ref 8.9–10.3)
Chloride: 100 mmol/L (ref 98–111)
Creatinine, Ser: 0.68 mg/dL (ref 0.44–1.00)
GFR calc Af Amer: >60 mL/min (ref >60)
GFR calc non Af Amer: >60 mL/min (ref >60)
Glucose, Bld: 134 mg/dL — ABNORMAL HIGH (ref 70–99)
Potassium: 3.6 mmol/L (ref 3.5–5.1)
Sodium: 138 mmol/L (ref 135–145)

## 2018-10-19 ENCOUNTER — Other Ambulatory Visit: Payer: Self-pay

## 2018-10-19 LAB — PROTEIN ELECTROPHORESIS, SERUM
A/G Ratio: 1.1 (ref 0.7–1.7)
Albumin ELP: 3.8 g/dL (ref 2.9–4.4)
Alpha-1-Globulin: 0.2 g/dL (ref 0.0–0.4)
Alpha-2-Globulin: 0.8 g/dL (ref 0.4–1.0)
Beta Globulin: 0.9 g/dL (ref 0.7–1.3)
Gamma Globulin: 1.6 g/dL (ref 0.4–1.8)
Globulin, Total: 3.5 g/dL (ref 2.2–3.9)
M-Spike, %: 0.4 g/dL — ABNORMAL HIGH
Total Protein ELP: 7.3 g/dL (ref 6.0–8.5)

## 2018-10-19 LAB — IGG, IGA, IGM
IgA: 543 mg/dL — ABNORMAL HIGH (ref 64–422)
IgG (Immunoglobin G), Serum: 1553 mg/dL (ref 586–1602)
IgM (Immunoglobulin M), Srm: 65 mg/dL (ref 26–217)

## 2018-10-19 LAB — KAPPA/LAMBDA LIGHT CHAINS
Kappa free light chain: 29.8 mg/L — ABNORMAL HIGH (ref 3.3–19.4)
Kappa, lambda light chain ratio: 1.08 (ref 0.26–1.65)
Lambda free light chains: 27.5 mg/L — ABNORMAL HIGH (ref 5.7–26.3)

## 2018-10-19 MED ORDER — LOSARTAN POTASSIUM-HCTZ 100-25 MG PO TABS: 1.0000 | ORAL_TABLET | Freq: Every day | ORAL | 1 refills | Status: DC

## 2018-10-27 ENCOUNTER — Telehealth: Payer: Self-pay | Admitting: Oncology

## 2018-10-27 NOTE — Progress Notes (Addendum)
[place]  [phone]  [phone]     MAIA HANDA DOB: [birth year]  MR#: [id number]  KVQ#:[id number]  Patient Care Team: [name], MD as PCP - General (Internal Medicine)  I connected with Johny Sax on 10/28/18 at 10:00 AM EDT by telephone visit and verified that I am speaking with the correct person using two identifiers.   I discussed the limitations, risks, security and privacy concerns of performing an evaluation and management service by telemedicine and the availability of in-person appointments. I also discussed with the patient that there may be a patient responsible charge related to this service. The patient expressed understanding and agreed to proceed.   Other persons participating in the visit and their role in the encounter: Patient, MD  Patient's location: Home Provider's location: Home  CHIEF COMPLAINT: MGUS  INTERVAL HISTORY: Patient agreed to telephone visit for routine yearly evaluation and discussion of her laboratory work.  She continues to feel well and remains asymptomatic.  She has no neurologic complaints. She denies any recent fevers or illnesses. She has a good appetite and denies weight loss. She denies any chest pain, shortness of breath, cough, or hemoptysis.. She denies any nausea, vomiting, constipation, or diarrhea. She has no urinary complaints.  She denies any bony pain.  Patient feels at her baseline offers no specific complaints today.  REVIEW OF SYSTEMS:   Review of Systems  Constitutional: Negative.  Negative for fever, malaise/fatigue and weight loss.  Respiratory: Negative.  Negative for cough and shortness of breath.   Cardiovascular: Negative.  Negative for chest pain and leg swelling.  Gastrointestinal: Negative.  Negative for abdominal pain and constipation.  Genitourinary: Negative.  Negative for dysuria.  Musculoskeletal: Negative.  Negative for back pain.  Neurological: Negative.   Negative for sensory change, focal weakness and weakness.  Endo/Heme/Allergies: Does not bruise/bleed easily.  Psychiatric/Behavioral: Negative.  The patient is not nervous/anxious.    As per HPI. Otherwise, a complete review of systems is negative.   PAST MEDICAL HISTORY: Past Medical History:  Diagnosis Date  . Arthritis   . COPD (chronic obstructive pulmonary disease) ([place])   . Diverticulitis   . Gout   . Hyperlipidemia   . Hypertension   . Hypothyroidism   . 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 Microhematuria   . Mild mitral regurgitation   . Mild tricuspid regurgitation   . Osteopenia   . Pulmonary fibrosis ([place])   . Urinary incontinence     PAST SURGICAL HISTORY: Past Surgical History:  Procedure Laterality Date  . ABDOMINAL HYSTERECTOMY    . ANKLE SURGERY Left   . APPENDECTOMY    . CHOLECYSTECTOMY    . COLONOSCOPY WITH PROPOFOL N/A 12/04/2014   Procedure: COLONOSCOPY WITH PROPOFOL;  Surgeon: Lollie Sails, MD;  Location: [facility] ENDOSCOPY;  Service: Endoscopy;  Laterality: N/A;  . FRACTURE SURGERY     Femur Fx; LT Ankle Pinning  . JOINT REPLACEMENT     RT TKR  . Patial Thyroid Resection      FAMILY HISTORY: Reviewed and unchanged. No reported history of malignancy or chronic disease.   GYNECOLOGIC HISTORY:  No LMP recorded. Patient has had a hysterectomy.     ADVANCED DIRECTIVES:    HEALTH MAINTENANCE: Social History   Tobacco Use  . Smoking status: Former Smoker    Years: 1.00    Types: Cigarettes  . Smokeless tobacco: Never Used  Substance Use Topics  . Alcohol use:  No  . Drug use: No       Allergies  Allergen Reactions  . Codeine   . Levaquin [Levofloxacin]     Current Outpatient Medications  Medication Sig Dispense Refill  . allopurinol (ZYLOPRIM) 100 MG tablet Take 1 tablet (100 mg total) by mouth daily. 180 tablet 1  . ascorbic acid (VITAMIN C) 500 MG tablet Take 500 mg by mouth daily.    Marland Kitchen aspirin EC  81 MG tablet Take 81 mg by mouth daily.    Marland Kitchen atorvastatin (LIPITOR) 10 MG tablet Take 1 tablet (10 mg total) by mouth daily at 6 PM. 90 tablet 2  . carvedilol (COREG) 25 MG tablet Take 0.5 tablets (12.5 mg total) by mouth 2 (two) times daily with a meal. 90 tablet 1  . cholecalciferol (VITAMIN D) 400 UNITS TABS tablet Take 1,000 Units by mouth.    . cloNIDine (CATAPRES) 0.1 MG tablet Take 1 tablet (0.1 mg total) by mouth daily. 180 tablet 1  . diclofenac sodium (VOLTAREN) 1 % GEL Apply 3 grams to 3 large joints, up to 3 times daily as needed. 3 Tube 3  . furosemide (LASIX) 40 MG tablet Take one tablet by mouth two times daily 180 tablet 1  . Ginger, Zingiber officinalis, (GINGER PO) Take by mouth.    Marland Kitchen ibuprofen (ADVIL,MOTRIN) 600 MG tablet Take 1 tablet (600 mg total) by mouth every 8 (eight) hours as needed. 90 tablet 1  . levocetirizine (XYZAL) 5 MG tablet TAKE 1 TABLET EVERY DAY  FOR  ALLERGIES 90 tablet 0  . levothyroxine (SYNTHROID, LEVOTHROID) 137 MCG tablet Take 1 tablet (137 mcg total) by mouth daily before breakfast. 90 tablet 1  . losartan-hydrochlorothiazide (HYZAAR) 100-25 MG tablet Take 1 tablet by mouth daily. 90 tablet 1  . magnesium oxide (MAG-OX) 400 MG tablet Take 400 mg by mouth daily.     . meclizine (ANTIVERT) 12.5 MG tablet Take 12.5 mg by mouth as needed.     Marland Kitchen omega-3 acid ethyl esters (LOVAZA) 1 G capsule Take 1 g by mouth 2 (two) times daily.    Marland Kitchen omeprazole (PRILOSEC) 20 MG capsule Take one capsule by mouth two times daily 180 capsule 1  . oxyCODONE (OXY IR/ROXICODONE) 5 MG immediate release tablet Take 1 tablet (5 mg total) by mouth every 4 (four) hours as needed for severe pain. 30 tablet 0  . potassium chloride (K-DUR) 10 MEQ tablet Take 1 tablet (10 mEq total) by mouth daily. 90 tablet 1  . Potassium Gluconate 595 MG CAPS Take by mouth.    . predniSONE (DELTASONE) 10 MG tablet Take 1 tablet (10 mg total) by mouth daily with breakfast. 30 tablet 0  . tiZANidine  (ZANAFLEX) 4 MG tablet Take 1 tablet (4 mg total) by mouth 2 (two) times daily. 90 tablet 1  . traMADol (ULTRAM) 50 MG tablet Take 1 tablet (50 mg total) by mouth every 8 (eight) hours as needed. 90 tablet 1  . vitamin B-12 (CYANOCOBALAMIN) 100 MCG tablet     . vitamin E 400 UNIT capsule Take 400 Units by mouth 2 (two) times daily.     . Vitamin E 400 units TABS     . Zoster Vaccine Adjuvanted [facility]) injection Shingles vaccine series - inject IM as directed . 0.5 mL 1   No current facility-administered medications for this visit.     OBJECTIVE: There were no vitals taken for this visit.   There is no height or weight on file  to calculate BMI.    ECOG FS:0 - Asymptomatic   LAB RESULTS:  No visits with results within 3 Day(s) from this visit.  Latest known visit with results is:  Appointment on 10/18/2018  Component Date Value Ref Range Status  . Kappa free light chain 10/18/2018 29.8* 3.3 - 19.4 mg/L Final  . Lamda free light chains 10/18/2018 27.5* 5.7 - 26.3 mg/L Final  . Kappa, lamda light chain ratio 10/18/2018 1.08  0.26 - 1.65 Final   Comment: (NOTE) Performed At: [facility] [place], [place] [id number] Rush Farmer MD [id number]   . IgG (Immunoglobin G), Serum 10/18/2018 1,553  586 - 1,602 mg/dL Final  . IgA 10/18/2018 543* 64 - 422 mg/dL Final  . IgM (Immunoglobulin M), Srm 10/18/2018 65  26 - 217 mg/dL Final   Comment: (NOTE) Performed At: [facility] [place], [place] [id number] Rush Farmer MD [id number]   . Sodium 10/18/2018 138  135 - 145 mmol/L Final  . Potassium 10/18/2018 3.6  3.5 - 5.1 mmol/L Final  . Chloride 10/18/2018 100  98 - 111 mmol/L Final  . CO2 10/18/2018 30  22 - 32 mmol/L Final  . Glucose, Bld 10/18/2018 134* 70 - 99 mg/dL Final  . BUN 10/18/2018 21  8 - 23 mg/dL Final  . Creatinine, Ser 10/18/2018 0.68  0.44 - 1.00 mg/dL Final  . Calcium 10/18/2018 9.8  8.9 - 10.3 mg/dL Final  . GFR  calc non Af Amer 10/18/2018 >60  >60 mL/min Final  . GFR calc Af Amer 10/18/2018 >60  >60 mL/min Final  . Anion gap 10/18/2018 8  5 - 15 Final   Performed at [facility], [address] [place], [place] [address]  . WBC 10/18/2018 3.9* 4.0 - 10.5 K/uL Final  . RBC 10/18/2018 3.91  3.87 - 5.11 MIL/uL Final  . Hemoglobin 10/18/2018 13.0  12.0 - 15.0 g/dL Final  . HCT 10/18/2018 37.4  36.0 - 46.0 % Final  . MCV 10/18/2018 95.7  80.0 - 100.0 fL Final  . MCH 10/18/2018 33.2  26.0 - 34.0 pg Final  . MCHC 10/18/2018 34.8  30.0 - 36.0 g/dL Final  . RDW 10/18/2018 12.3  11.5 - 15.5 % Final  . Platelets 10/18/2018 142* 150 - 400 K/uL Final  . nRBC 10/18/2018 0.0  0.0 - 0.2 % Final  . Neutrophils Relative % 10/18/2018 70  % Final  . Neutro Abs 10/18/2018 2.7  1.7 - 7.7 K/uL Final  . Lymphocytes Relative 10/18/2018 19  % Final  . Lymphs Abs 10/18/2018 0.7  0.7 - 4.0 K/uL Final  . Monocytes Relative 10/18/2018 8  % Final  . Monocytes Absolute 10/18/2018 0.3  0.1 - 1.0 K/uL Final  . Eosinophils Relative 10/18/2018 2  % Final  . Eosinophils Absolute 10/18/2018 0.1  0.0 - 0.5 K/uL Final  . Basophils Relative 10/18/2018 1  % Final  . Basophils Absolute 10/18/2018 0.0  0.0 - 0.1 K/uL Final  . Immature Granulocytes 10/18/2018 0  % Final  . Abs Immature Granulocytes 10/18/2018 0.01  0.00 - 0.07 K/uL Final   Performed at [facility], [address] [place], [place] [address]  . Total Protein ELP 10/18/2018 7.3  6.0 - 8.5 g/dL Final  . Albumin ELP 10/18/2018 3.8  2.9 - 4.4 g/dL Final  . Alpha-1-Globulin 10/18/2018 0.2  0.0 - 0.4 g/dL Final  . Alpha-2-Globulin 10/18/2018 0.8  0.4 - 1.0 g/dL Final  .  Beta Globulin 10/18/2018 0.9  0.7 - 1.3 g/dL Final  . Gamma Globulin 10/18/2018 1.6  0.4 - 1.8 g/dL Final  . M-Spike, % 10/18/2018 0.4* Not Observed g/dL Final  . SPE Interp. 10/18/2018 Comment   Final   Comment: (NOTE) Faint band in gamma region suspicious for monoclonal immunoglobulin.  This band may represent a benign spike as seen in older people or could be a paraprotein as seen in Multiple Myeloma, Waldenstrom's Macroglobulinemia or Lymphoma. Depending on clinical circumstances, further diagnostic studies may include serum immunofixation or serum free light chain quantitation. Performed At: [facility] [place], [place] [id number] Rush Farmer MD [id number]   . Comment 10/18/2018 Comment   Final   Comment: (NOTE) Protein electrophoresis scan will follow via computer, mail, or courier delivery.   . Globulin, Total 10/18/2018 3.5  2.2 - 3.9 g/dL Corrected  . A/G Ratio 10/18/2018 1.1  0.7 - 1.7 Corrected    STUDIES: No results found.  ASSESSMENT: MGUS  PLAN:   1. MGUS: Patient's most recent M spike remained stable at 0.4 and unchanged since at least March 2017.  Her IgA also remains mildly elevated, but stable and unchanged at 543.  Her kappa/lambda light chain ratio continues to be within normal limits.  She has no evidence of endorgan damage.  She was noted to have mild hypercalcemia 6 months ago, but this resolved without intervention.  Patient does not require bone marrow biopsy or metastatic bone survey, but will consider if there was concern of progression of disease.  No intervention is needed at this time.  Return to clinic in 1 year with repeat laboratory work and further evaluation. 2. Leukopenia: Chronic and unchanged since approximately April 2010. 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 By report, previous bone marrow biopsy was negative for underlying pathology, but we do not have these results. 3. 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  I provided 15 minutes of non face-to-face telephone visit time during this encounter, and > 50% was spent counseling as documented under my assessment & plan.  Patient expressed understanding and was in agreement with this plan. She also understands that She  can call clinic at any time with any questions, concerns, or complaints.    Lloyd Huger, MD   10/28/2018 9:30 AM

## 2018-10-28 ENCOUNTER — Other Ambulatory Visit: Payer: Self-pay

## 2018-10-28 ENCOUNTER — Encounter: Payer: Self-pay | Admitting: Oncology

## 2018-10-28 ENCOUNTER — Inpatient Hospital Stay (HOSPITAL_BASED_OUTPATIENT_CLINIC_OR_DEPARTMENT_OTHER): Payer: Medicare HMO | Admitting: Oncology

## 2018-10-28 DIAGNOSIS — D472 Monoclonal gammopathy: Secondary | ICD-10-CM | POA: Diagnosis not present

## 2018-10-28 NOTE — Addendum Note (Signed)
Addended by: Wayna Chalet on: 10/28/2018 09:48 AM   Modules accepted: Orders

## 2018-10-28 NOTE — Progress Notes (Signed)
Patient stated that she had been doing well with no complaints. 

## 2018-11-08 ENCOUNTER — Ambulatory Visit (INDEPENDENT_AMBULATORY_CARE_PROVIDER_SITE_OTHER): Payer: Medicare HMO | Admitting: Nurse Practitioner

## 2018-11-08 ENCOUNTER — Encounter: Payer: Self-pay | Admitting: Nurse Practitioner

## 2018-11-08 ENCOUNTER — Other Ambulatory Visit: Payer: Self-pay

## 2018-11-08 VITALS — BP 103/68 | HR 63 | Resp 16 | Ht 63.0 in | Wt 200.8 lb

## 2018-11-08 DIAGNOSIS — J0100 Acute maxillary sinusitis, unspecified: Secondary | ICD-10-CM | POA: Diagnosis not present

## 2018-11-08 DIAGNOSIS — Z8739 Personal history of other diseases of the musculoskeletal system and connective tissue: Secondary | ICD-10-CM | POA: Diagnosis not present

## 2018-11-08 DIAGNOSIS — I10 Essential (primary) hypertension: Secondary | ICD-10-CM | POA: Diagnosis not present

## 2018-11-08 DIAGNOSIS — M064 Inflammatory polyarthropathy: Secondary | ICD-10-CM | POA: Diagnosis not present

## 2018-11-08 DIAGNOSIS — E039 Hypothyroidism, unspecified: Secondary | ICD-10-CM | POA: Diagnosis not present

## 2018-11-08 MED ORDER — LEVOTHYROXINE SODIUM 137 MCG PO TABS: 137.0000 ug | ORAL_TABLET | Freq: Every day | ORAL | 1 refills | Status: DC

## 2018-11-08 MED ORDER — ALLOPURINOL 100 MG PO TABS: 200.0000 mg | ORAL_TABLET | Freq: Every day | ORAL | 1 refills | Status: DC

## 2018-11-08 MED ORDER — TRAMADOL HCL 50 MG PO TABS: 50.0000 mg | ORAL_TABLET | Freq: Three times a day (TID) | ORAL | 1 refills | Status: DC | PRN

## 2018-11-08 MED ORDER — PREDNISONE 5 MG PO TABS: 10.0000 mg | ORAL_TABLET | Freq: Every day | ORAL | 2 refills | Status: DC

## 2018-11-08 MED ORDER — CLONIDINE HCL 0.1 MG PO TABS: 0.1000 mg | ORAL_TABLET | Freq: Two times a day (BID) | ORAL | 1 refills | Status: DC

## 2018-11-08 MED ORDER — OXYCODONE HCL 5 MG PO TABS: 5.0000 mg | ORAL_TABLET | ORAL | 0 refills | Status: DC | PRN

## 2018-11-08 NOTE — Progress Notes (Signed)
[facility] [place], [place] [address]  Internal MEDICINE  Office Visit Note  Patient Name: Martha Peterson  [id number]  [id number]  Date of Service: 11/21/2018  Chief Complaint  Patient presents with  . Medical Management of Chronic Issues    3 month follow up, medication confusion on the dosage on some of them would like to go over them   . Hypertension  . Hyperlipidemia  . Hypothyroidism    The patient is here for routine follow up exam. She reports feeling well overall and presents with no new concerns or complaints. Reduced dosing of levothyroxine at her last visit. She reports some increased energyl levels and improved activity levels. New carotid doppler study was performed 06/03/2018. This is showing mild plaque in bilateral carotid arteries. There is 50-69% stenosis on the left side and <50% stenosis on the right. New echo also doen 06/03/2018. There is normal LVF with diastolic dysfunction with restricted pattern, biatrial enlargement. She has mild to moderate mitral and tricuspid regurgitation, and moderate pulmonary hypertension.   Hypertension  This is a chronic problem. The current episode started more than 1 year ago. The problem is unchanged. The problem is controlled. Associated symptoms include headaches and palpitations. Pertinent negatives include no chest pain or shortness of breath. Agents associated with hypertension include NSAIDs and thyroid hormones. Risk factors for coronary artery disease include dyslipidemia and post-menopausal state. Past treatments include beta blockers, diuretics, central alpha agonists and ACE inhibitors. The current treatment provides moderate improvement. Compliance problems include exercise.  Hypertensive end-organ damage includes left ventricular hypertrophy.       Current Medication: Outpatient Encounter Medications as of 11/08/2018  Medication Sig Note  . allopurinol (ZYLOPRIM) 100 MG tablet Take 2  tablets (200 mg total) by mouth daily.   Marland Kitchen ascorbic acid (VITAMIN C) 500 MG tablet Take 500 mg by mouth daily.   Marland Kitchen aspirin EC 81 MG tablet Take 81 mg by mouth daily.   Marland Kitchen atorvastatin (LIPITOR) 10 MG tablet Take 1 tablet (10 mg total) by mouth daily at 6 PM.   . carvedilol (COREG) 25 MG tablet Take 0.5 tablets (12.5 mg total) by mouth 2 (two) times daily with a meal.   . cholecalciferol (VITAMIN D) 400 UNITS TABS tablet Take 1,000 Units by mouth.   . cloNIDine (CATAPRES) 0.1 MG tablet Take 1 tablet (0.1 mg total) by mouth 2 (two) times daily.   . diclofenac sodium (VOLTAREN) 1 % GEL Apply 3 grams to 3 large joints, up to 3 times daily as needed.   . furosemide (LASIX) 40 MG tablet Take one tablet by mouth two times daily   . Ginger, Zingiber officinalis, (GINGER PO) Take by mouth.   Marland Kitchen ibuprofen (ADVIL,MOTRIN) 600 MG tablet Take 1 tablet (600 mg total) by mouth every 8 (eight) hours as needed.   Marland Kitchen levothyroxine (SYNTHROID) 137 MCG tablet Take 1 tablet (137 mcg total) by mouth daily before breakfast.   . losartan-hydrochlorothiazide (HYZAAR) 100-25 MG tablet Take 1 tablet by mouth daily.   . magnesium oxide (MAG-OX) 400 MG tablet Take 400 mg by mouth daily.  08/20/2015: Received from: External Pharmacy  . meclizine (ANTIVERT) 12.5 MG tablet Take 12.5 mg by mouth as needed.    Marland Kitchen omega-3 acid ethyl esters (LOVAZA) 1 G capsule Take 1 g by mouth 2 (two) times daily.   Marland Kitchen omeprazole (PRILOSEC) 20 MG capsule Take one capsule by mouth two times daily   . oxyCODONE (OXY IR/ROXICODONE) 5  MG immediate release tablet Take 1 tablet (5 mg total) by mouth every 4 (four) hours as needed for severe pain.   . potassium chloride (K-DUR) 10 MEQ tablet Take 1 tablet (10 mEq total) by mouth daily.   . Potassium Gluconate 595 MG CAPS Take by mouth.   . predniSONE (DELTASONE) 5 MG tablet Take 2 tablets (10 mg total) by mouth daily with breakfast.   . tiZANidine (ZANAFLEX) 4 MG tablet Take 1 tablet (4 mg total) by mouth 2  (two) times daily.   . traMADol (ULTRAM) 50 MG tablet Take 1 tablet (50 mg total) by mouth every 8 (eight) hours as needed.   . vitamin B-12 (CYANOCOBALAMIN) 100 MCG tablet    . vitamin E 400 UNIT capsule Take 400 Units by mouth 2 (two) times daily.  08/20/2015: Received from: External Pharmacy  . Vitamin E 400 units TABS    . Zoster Vaccine Adjuvanted [facility]) injection Shingles vaccine series - inject IM as directed .   . [DISCONTINUED] allopurinol (ZYLOPRIM) 100 MG tablet Take 1 tablet (100 mg total) by mouth daily.   . [DISCONTINUED] allopurinol (ZYLOPRIM) 100 MG tablet Take 2 tablets (200 mg total) by mouth daily.   . [DISCONTINUED] cloNIDine (CATAPRES) 0.1 MG tablet Take 1 tablet (0.1 mg total) by mouth daily.   . [DISCONTINUED] levocetirizine (XYZAL) 5 MG tablet TAKE 1 TABLET EVERY DAY  FOR  ALLERGIES   . [DISCONTINUED] levothyroxine (SYNTHROID) 137 MCG tablet Take 1 tablet (137 mcg total) by mouth daily before breakfast.   . [DISCONTINUED] levothyroxine (SYNTHROID, LEVOTHROID) 137 MCG tablet Take 1 tablet (137 mcg total) by mouth daily before breakfast.   . [DISCONTINUED] oxyCODONE (OXY IR/ROXICODONE) 5 MG immediate release tablet Take 1 tablet (5 mg total) by mouth every 4 (four) hours as needed for severe pain.   . [DISCONTINUED] predniSONE (DELTASONE) 10 MG tablet Take 1 tablet (10 mg total) by mouth daily with breakfast.   . [DISCONTINUED] predniSONE (DELTASONE) 5 MG tablet Take 2 tablets (10 mg total) by mouth daily with breakfast.   . [DISCONTINUED] traMADol (ULTRAM) 50 MG tablet Take 1 tablet (50 mg total) by mouth every 8 (eight) hours as needed.   . [DISCONTINUED] traMADol (ULTRAM) 50 MG tablet Take 1 tablet (50 mg total) by mouth every 8 (eight) hours as needed.    No facility-administered encounter medications on file as of 11/08/2018.     Surgical History: Past Surgical History:  Procedure Laterality Date  . ABDOMINAL HYSTERECTOMY    . ANKLE SURGERY Left   . APPENDECTOMY     . CHOLECYSTECTOMY    . COLONOSCOPY WITH PROPOFOL N/A 12/04/2014   Procedure: COLONOSCOPY WITH PROPOFOL;  Surgeon: Lollie Sails, MD;  Location: [facility] ENDOSCOPY;  Service: Endoscopy;  Laterality: N/A;  . FRACTURE SURGERY     Femur Fx; LT Ankle Pinning  . JOINT REPLACEMENT     RT TKR  . Patial Thyroid Resection      Medical History: Past Medical History:  Diagnosis Date  . Arthritis   . COPD (chronic obstructive pulmonary disease) ([place])   . Diverticulitis   . Gout   . Hyperlipidemia   . Hypertension   . Hypothyroidism   . 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 Microhematuria   . Mild mitral regurgitation   . Mild tricuspid regurgitation   . Osteopenia   . Pulmonary fibrosis ([place])   . Urinary incontinence     Family History: Family History  Problem Relation Age  of Onset  . Heart Problems Mother   . Heart Problems Father   . Diabetes Father   . Lupus Sister   . Thyroid disease Sister   . Heart Problems Sister   . Hypertension Son   . Diabetes Son        borderline   . Arthritis Daughter        in the left knee   . Diabetes Daughter   . Neuropathy Daughter   . Depression Daughter   . Anxiety disorder Daughter     Social History   Socioeconomic History  . Marital status: Married    Spouse name: Not on file  . Number of children: Not on file  . Years of education: Not on file  . Highest education level: Not on file  Occupational History  . Not on file  Social Needs  . Financial resource strain: Not on file  . Food insecurity:    Worry: Not on file    Inability: Not on file  . Transportation needs:    Medical: Not on file    Non-medical: Not on file  Tobacco Use  . Smoking status: Former Smoker    Years: 1.00    Types: Cigarettes  . Smokeless tobacco: Never Used  Substance and Sexual Activity  . Alcohol use: No  . Drug use: No  . Sexual activity: Not Currently  Lifestyle  . Physical activity:    Days per week: Not on  file    Minutes per session: Not on file  . Stress: Not on file  Relationships  . Social connections:    Talks on phone: Not on file    Gets together: Not on file    Attends religious service: Not on file    Active member of club or organization: Not on file    Attends meetings of clubs or organizations: Not on file    Relationship status: Not on file  . Intimate partner violence:    Fear of current or ex partner: Not on file    Emotionally abused: Not on file    Physically abused: Not on file    Forced sexual activity: Not on file  Other Topics Concern  . Not on file  Social History Narrative  . Not on file      Review of Systems  Constitutional: Positive for fatigue. Negative for activity change, chills and unexpected weight change.       Improved fatigue since last visit   HENT: Negative for congestion, postnasal drip, sinus pressure, sinus pain and sore throat.   Respiratory: Negative for cough, shortness of breath and wheezing.   Cardiovascular: Positive for palpitations and leg swelling. Negative for chest pain.  Gastrointestinal: Negative for constipation, diarrhea, nausea and vomiting.  Endocrine: Negative for cold intolerance, heat intolerance, polydipsia and polyuria.       Good blood sugar control   Musculoskeletal: Positive for arthralgias, joint swelling and myalgias.       Moderate to severe pain in left hip. Improved since her most recent visit. Walking better, no longer using cane to help with mobility.   Skin: Negative for color change and rash.  Allergic/Immunologic: Positive for environmental allergies.  Neurological: Positive for headaches. Negative for dizziness.  Hematological: Negative for adenopathy. Does not bruise/bleed easily.  Psychiatric/Behavioral: Negative for dysphoric mood. The patient is not nervous/anxious.     Today's Vitals   11/08/18 1620  BP: 103/68  Pulse: 63  Resp: 16  SpO2: 94%  Weight: 200 lb 12.8 oz (91.1 kg)  Height: 5\' 3"   (1.6 m)   Body mass index is 35.57 kg/m.   Physical Exam Vitals signs and nursing note reviewed.  Constitutional:      General: She is not in acute distress.    Appearance: Normal appearance. She is well-developed. She is not diaphoretic.  HENT:     Head: Normocephalic and atraumatic.     Mouth/Throat:     Pharynx: No oropharyngeal exudate.  Eyes:     Pupils: Pupils are equal, round, and reactive to light.  Neck:     Musculoskeletal: Normal range of motion and neck supple.     Thyroid: No thyromegaly.     Vascular: No JVD.     Trachea: No tracheal deviation.  Cardiovascular:     Rate and Rhythm: Normal rate and regular rhythm.     Heart sounds: Murmur present. Systolic murmur present with a grade of 2/6. No friction rub. No gallop.      Comments: Moderate, non-pitting edema in left lower leg and ankle.  Pulmonary:     Effort: Pulmonary effort is normal. No respiratory distress.     Breath sounds: Normal breath sounds. No wheezing or rales.  Chest:     Chest wall: No tenderness.  Abdominal:     General: Bowel sounds are normal.     Palpations: Abdomen is soft.     Tenderness: There is no abdominal tenderness.  Musculoskeletal: Normal range of motion.     Comments: Moderate left hip tenderness. No bony deformity or abnormality noted. Patient needing to walk with limp favoring the left side.   Lymphadenopathy:     Cervical: No cervical adenopathy.  Skin:    General: Skin is warm and dry.     Capillary Refill: Capillary refill takes 2 to 3 seconds.  Neurological:     Mental Status: She is alert and oriented to person, place, and time.     Cranial Nerves: No cranial nerve deficit.  Psychiatric:        Behavior: Behavior normal.        Thought Content: Thought content normal.        Judgment: Judgment normal.   Assessment/Plan: 1. Essential hypertension Very stable, even a little on the low side. Continue bp medication as prescribed  - cloNIDine (CATAPRES) 0.1 MG  tablet; Take 1 tablet (0.1 mg total) by mouth 2 (two) times daily.  Dispense: 180 tablet; Refill: 1  2. Inflammatory polyarthritis (HCC) Tramadol 50mg  may be taken up to three times daily for mild to moderate pain. Oxycodone 5mg  may be taken daily if needed for severe pain, not controlled with tramadol. New prescriptions sent to her pharmacy today.  Prednisone 5/10mg  daily to control overall pain and inflammation. Continue regular visits with orthopedics and rheumatology as scheduled.  - oxyCODONE (OXY IR/ROXICODONE) 5 MG immediate release tablet; Take 1 tablet (5 mg total) by mouth every 4 (four) hours as needed for severe pain.  Dispense: 30 tablet; Refill: 0 - traMADol (ULTRAM) 50 MG tablet; Take 1 tablet (50 mg total) by mouth every 8 (eight) hours as needed.  Dispense: 90 tablet; Refill: 1 - predniSONE (DELTASONE) 5 MG tablet; Take 2 tablets (10 mg total) by mouth daily with breakfast.  Dispense: 60 tablet; Refill: 2  3. Hypothyroidism (acquired) Stable. Continue levothyroxine as prescribed  - levothyroxine (SYNTHROID) 137 MCG tablet; Take 1 tablet (137 mcg total) by mouth daily before breakfast.  Dispense: 90 tablet; Refill: 1  4. H/O: gout - allopurinol (ZYLOPRIM) 100 MG tablet; Take 2 tablets (200 mg total) by mouth daily.  Dispense: 180 tablet; Refill: 1     General Counseling: Jordayn verbalizes understanding of the findings of todays visit and agrees with plan of treatment. I have discussed any further diagnostic evaluation that may be needed or ordered today. We also reviewed her medications today. she has been encouraged to call the office with any questions or concerns that should arise related to todays visit.  Cardiac risk factor modification:  1. Control blood pressure. 2. Exercise as prescribed. 3. Follow low sodium, low fat diet. and low fat and low cholestrol diet. 4. Take ASA 81mg  once a day. 5. Restricted calories diet to lose weight.  This patient was seen by [place] with Dr [name] as a part of collaborative care agreement  Meds ordered this encounter  Medications  . DISCONTD: allopurinol (ZYLOPRIM) 100 MG tablet    Sig: Take 2 tablets (200 mg total) by mouth daily.    Dispense:  180 tablet    Refill:  1    Patient should be taking two tablets daily.    Order Specific Question:   Supervising Provider    Answer:   [name] [[id number]]  . cloNIDine (CATAPRES) 0.1 MG tablet    Sig: Take 1 tablet (0.1 mg total) by mouth 2 (two) times daily.    Dispense:  180 tablet    Refill:  1    Patient should be taking this bid    Order Specific Question:   Supervising Provider    Answer:   [name] [[id number]]  . DISCONTD: traMADol (ULTRAM) 50 MG tablet    Sig: Take 1 tablet (50 mg total) by mouth every 8 (eight) hours as needed.    Dispense:  90 tablet    Refill:  1    Order Specific Question:   Supervising Provider    Answer:   [name] [[id number]]  . DISCONTD: predniSONE (DELTASONE) 5 MG tablet    Sig: Take 2 tablets (10 mg total) by mouth daily with breakfast.    Dispense:  60 tablet    Refill:  2    Order Specific Question:   Supervising Provider    Answer:   [name] [place]  . DISCONTD: levothyroxine (SYNTHROID) 137 MCG tablet    Sig: Take 1 tablet (137 mcg total) by mouth daily before breakfast.    Dispense:  90 tablet    Refill:  1    Order Specific Question:   Supervising Provider    Answer:   [name] [place]  . oxyCODONE (OXY IR/ROXICODONE) 5 MG immediate release tablet    Sig: Take 1 tablet (5 mg total) by mouth every 4 (four) hours as needed for severe pain.    Dispense:  30 tablet    Refill:  0    Order Specific Question:   Supervising Provider    Answer:   [name] [[id number]]  . traMADol (ULTRAM) 50 MG tablet    Sig: Take 1 tablet (50 mg total) by mouth every 8 (eight) hours as needed.    Dispense:  90 tablet    Refill:  1    Order Specific Question:   Supervising Provider    Answer:   [name] [[id number]]  . predniSONE (DELTASONE) 5 MG tablet    Sig: Take 2 tablets (10 mg total) by mouth daily  with breakfast.    Dispense:  60 tablet    Refill:  2    Order Specific Question:   Supervising Provider    Answer:   [name] [[id number]]  . levothyroxine (SYNTHROID) 137 MCG tablet    Sig: Take 1 tablet (137 mcg total) by mouth daily before breakfast.    Dispense:  90 tablet    Refill:  1    Order Specific Question:   Supervising Provider    Answer:   [name] [[id number]]  . allopurinol (ZYLOPRIM) 100 MG tablet    Sig: Take 2 tablets (200 mg total) by mouth daily.    Dispense:  180 tablet    Refill:  1    Patient should be taking two tablets daily.    Order Specific Question:   Supervising Provider    Answer:   [name] [[id number]]    Time spent: 47 Minutes      Dr [name] Internal medicine

## 2018-11-14 ENCOUNTER — Other Ambulatory Visit: Payer: Self-pay | Admitting: Internal Medicine

## 2018-11-14 MED ORDER — LEVOCETIRIZINE DIHYDROCHLORIDE 5 MG PO TABS: ORAL_TABLET | ORAL | 1 refills | Status: DC

## 2018-12-07 ENCOUNTER — Other Ambulatory Visit: Payer: Self-pay

## 2018-12-07 MED ORDER — CARVEDILOL 25 MG PO TABS: 12.5000 mg | ORAL_TABLET | Freq: Two times a day (BID) | ORAL | 1 refills | Status: DC

## 2018-12-09 DIAGNOSIS — Z1231 Encounter for screening mammogram for malignant neoplasm of breast: Secondary | ICD-10-CM | POA: Diagnosis not present

## 2019-01-03 ENCOUNTER — Other Ambulatory Visit: Payer: Self-pay

## 2019-01-03 DIAGNOSIS — J0100 Acute maxillary sinusitis, unspecified: Secondary | ICD-10-CM

## 2019-01-03 MED ORDER — PREDNISONE 5 MG PO TABS: 10.0000 mg | ORAL_TABLET | Freq: Every day | ORAL | 1 refills | Status: DC

## 2019-01-10 ENCOUNTER — Other Ambulatory Visit: Payer: Self-pay

## 2019-01-10 ENCOUNTER — Ambulatory Visit (INDEPENDENT_AMBULATORY_CARE_PROVIDER_SITE_OTHER): Payer: Medicare HMO | Admitting: Nurse Practitioner

## 2019-01-10 ENCOUNTER — Encounter: Payer: Self-pay | Admitting: Nurse Practitioner

## 2019-01-10 VITALS — BP 140/63 | HR 78 | Resp 16 | Ht 63.0 in | Wt 210.0 lb

## 2019-01-10 DIAGNOSIS — I10 Essential (primary) hypertension: Secondary | ICD-10-CM

## 2019-01-10 DIAGNOSIS — M064 Inflammatory polyarthropathy: Secondary | ICD-10-CM | POA: Diagnosis not present

## 2019-01-10 DIAGNOSIS — Z79899 Other long term (current) drug therapy: Secondary | ICD-10-CM | POA: Diagnosis not present

## 2019-01-10 LAB — POCT URINE DRUG SCREEN
POC Amphetamine UR: NOT DETECTED
POC BENZODIAZEPINES UR: NOT DETECTED
POC Barbiturate UR: NOT DETECTED
POC Cocaine UR: NOT DETECTED
POC Ecstasy UR: NOT DETECTED
POC Marijuana UR: NOT DETECTED
POC Methadone UR: NOT DETECTED
POC Methamphetamine UR: NOT DETECTED
POC Opiate Ur: NOT DETECTED
POC Oxycodone UR: NOT DETECTED
POC PHENCYCLIDINE UR: NOT DETECTED
POC TRICYCLICS UR: NOT DETECTED

## 2019-01-10 MED ORDER — OXYCODONE HCL 5 MG PO TABS: 5.0000 mg | ORAL_TABLET | ORAL | 0 refills | Status: DC | PRN

## 2019-01-10 NOTE — Progress Notes (Signed)
[facility] [place],  [address]  Internal MEDICINE  Office Visit Note  Patient Name: Martha Peterson  [id number]  [id number]  Date of Service: 01/22/2019  Chief Complaint  Patient presents with  . Hypertension  . Hyperlipidemia  . Medication Refill    hydrocodone    The patient is here for routine follow up visit. She had been having a lot of trouble with her hip the past week or so. Took her oxycodone and muscle relaxer for a few days and this really helped. Last dose of oxycodone was sometime last week. She takes it only as needed. Next month, she is to have wellness visit. She will be due to have routine, fasting labs prior to that. She has had her screening mammogram which was benign.       Current Medication: Outpatient Encounter Medications as of 01/10/2019  Medication Sig Note  . allopurinol (ZYLOPRIM) 100 MG tablet Take 2 tablets (200 mg total) by mouth daily.   Marland Kitchen ascorbic acid (VITAMIN C) 500 MG tablet Take 500 mg by mouth daily.   Marland Kitchen aspirin EC 81 MG tablet Take 81 mg by mouth daily.   Marland Kitchen atorvastatin (LIPITOR) 10 MG tablet Take 1 tablet (10 mg total) by mouth daily at 6 PM.   . carvedilol (COREG) 25 MG tablet Take 0.5 tablets (12.5 mg total) by mouth 2 (two) times daily with a meal.   . cholecalciferol (VITAMIN D) 400 UNITS TABS tablet Take 1,000 Units by mouth.   . cloNIDine (CATAPRES) 0.1 MG tablet Take 1 tablet (0.1 mg total) by mouth 2 (two) times daily.   . diclofenac sodium (VOLTAREN) 1 % GEL Apply 3 grams to 3 large joints, up to 3 times daily as needed.   . furosemide (LASIX) 40 MG tablet Take one tablet by mouth two times daily   . Ginger, Zingiber officinalis, (GINGER PO) Take by mouth.   Marland Kitchen ibuprofen (ADVIL,MOTRIN) 600 MG tablet Take 1 tablet (600 mg total) by mouth every 8 (eight) hours as needed.   Marland Kitchen levocetirizine (XYZAL) 5 MG tablet TAKE 1 TABLET EVERY DAY  FOR  ALLERGIES   . levothyroxine (SYNTHROID) 137 MCG tablet Take 1  tablet (137 mcg total) by mouth daily before breakfast.   . losartan-hydrochlorothiazide (HYZAAR) 100-25 MG tablet Take 1 tablet by mouth daily.   . magnesium oxide (MAG-OX) 400 MG tablet Take 400 mg by mouth daily.  08/20/2015: Received from: External Pharmacy  . meclizine (ANTIVERT) 12.5 MG tablet Take 12.5 mg by mouth as needed.    Marland Kitchen omega-3 acid ethyl esters (LOVAZA) 1 G capsule Take 1 g by mouth 2 (two) times daily.   Marland Kitchen omeprazole (PRILOSEC) 20 MG capsule Take one capsule by mouth two times daily   . oxyCODONE (OXY IR/ROXICODONE) 5 MG immediate release tablet Take 1 tablet (5 mg total) by mouth every 4 (four) hours as needed for severe pain.   . potassium chloride (K-DUR) 10 MEQ tablet Take 1 tablet (10 mEq total) by mouth daily.   . Potassium Gluconate 595 MG CAPS Take by mouth.   . predniSONE (DELTASONE) 5 MG tablet Take 2 tablets (10 mg total) by mouth daily with breakfast.   . tiZANidine (ZANAFLEX) 4 MG tablet Take 1 tablet (4 mg total) by mouth 2 (two) times daily.   . traMADol (ULTRAM) 50 MG tablet Take 1 tablet (50 mg total) by mouth every 8 (eight) hours as needed.   . vitamin B-12 (CYANOCOBALAMIN) 100  MCG tablet    . vitamin E 400 UNIT capsule Take 400 Units by mouth 2 (two) times daily.  08/20/2015: Received from: External Pharmacy  . Vitamin E 400 units TABS    . Zoster Vaccine Adjuvanted [facility]) injection Shingles vaccine series - inject IM as directed .   . [DISCONTINUED] oxyCODONE (OXY IR/ROXICODONE) 5 MG immediate release tablet Take 1 tablet (5 mg total) by mouth every 4 (four) hours as needed for severe pain.    No facility-administered encounter medications on file as of 01/10/2019.     Surgical History: Past Surgical History:  Procedure Laterality Date  . ABDOMINAL HYSTERECTOMY    . ANKLE SURGERY Left   . APPENDECTOMY    . CHOLECYSTECTOMY    . COLONOSCOPY WITH PROPOFOL N/A 12/04/2014   Procedure: COLONOSCOPY WITH PROPOFOL;  Surgeon: Lollie Sails, MD;  Location:  [facility] ENDOSCOPY;  Service: Endoscopy;  Laterality: N/A;  . FRACTURE SURGERY     Femur Fx; LT Ankle Pinning  . JOINT REPLACEMENT     RT TKR  . Patial Thyroid Resection      Medical History: Past Medical History:  Diagnosis Date  . Arthritis   . COPD (chronic obstructive pulmonary disease) ([place])   . Diverticulitis   . Gout   . Hyperlipidemia   . Hypertension   . Hypothyroidism   . 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 Microhematuria   . Mild mitral regurgitation   . Mild tricuspid regurgitation   . Osteopenia   . Pulmonary fibrosis ([place])   . Urinary incontinence     Family History: Family History  Problem Relation Age of Onset  . Heart Problems Mother   . Heart Problems Father   . Diabetes Father   . Lupus Sister   . Thyroid disease Sister   . Heart Problems Sister   . Hypertension Son   . Diabetes Son        borderline   . Arthritis Daughter        in the left knee   . Diabetes Daughter   . Neuropathy Daughter   . Depression Daughter   . Anxiety disorder Daughter     Social History   Socioeconomic History  . Marital status: Married    Spouse name: Not on file  . Number of children: Not on file  . Years of education: Not on file  . Highest education level: Not on file  Occupational History  . Not on file  Social Needs  . Financial resource strain: Not on file  . Food insecurity    Worry: Not on file    Inability: Not on file  . Transportation needs    Medical: Not on file    Non-medical: Not on file  Tobacco Use  . Smoking status: Former Smoker    Years: 1.00    Types: Cigarettes  . Smokeless tobacco: Never Used  Substance and Sexual Activity  . Alcohol use: No  . Drug use: No  . Sexual activity: Not Currently  Lifestyle  . Physical activity    Days per week: Not on file    Minutes per session: Not on file  . Stress: Not on file  Relationships  . Social Herbalist on phone: Not on file    Gets  together: Not on file    Attends religious service: Not on file    Active member of club or organization: Not on file    Attends meetings  of clubs or organizations: Not on file    Relationship status: Not on file  . Intimate partner violence    Fear of current or ex partner: Not on file    Emotionally abused: Not on file    Physically abused: Not on file    Forced sexual activity: Not on file  Other Topics Concern  . Not on file  Social History Narrative  . Not on file      Review of Systems  Constitutional: Positive for fatigue. Negative for activity change, chills and unexpected weight change.  HENT: Negative for congestion, postnasal drip, sinus pressure, sinus pain and sore throat.   Respiratory: Negative for cough, shortness of breath and wheezing.   Cardiovascular: Positive for palpitations and leg swelling. Negative for chest pain.  Gastrointestinal: Negative for constipation, diarrhea, nausea and vomiting.  Endocrine: Negative for cold intolerance, heat intolerance, polydipsia and polyuria.       Good blood sugar control   Musculoskeletal: Positive for arthralgias, joint swelling and myalgias.       Moderate to severe pain in left hip. Improved since her most recent visit. Walking better, no longer using cane to help with mobility.   Skin: Negative for color change and rash.  Allergic/Immunologic: Positive for environmental allergies.  Neurological: Positive for headaches. Negative for dizziness.  Hematological: Negative for adenopathy. Does not bruise/bleed easily.  Psychiatric/Behavioral: Negative for dysphoric mood. The patient is not nervous/anxious.     Today's Vitals   01/10/19 1421  BP: 140/63  Pulse: 78  Resp: 16  SpO2: 95%  Weight: 210 lb (95.3 kg)  Height: 5\' 3"  (1.6 m)   Body mass index is 37.2 kg/m.  Physical Exam Vitals signs and nursing note reviewed.  Constitutional:      General: She is not in acute distress.    Appearance: Normal appearance.  She is well-developed. She is not diaphoretic.  HENT:     Head: Normocephalic and atraumatic.     Mouth/Throat:     Pharynx: No oropharyngeal exudate.  Eyes:     Pupils: Pupils are equal, round, and reactive to light.  Neck:     Musculoskeletal: Normal range of motion and neck supple.     Thyroid: No thyromegaly.     Vascular: No JVD.     Trachea: No tracheal deviation.  Cardiovascular:     Rate and Rhythm: Normal rate and regular rhythm.     Heart sounds: Murmur present. Systolic murmur present with a grade of 2/6. No friction rub. No gallop.      Comments: Moderate, non-pitting edema in left lower leg and ankle.  Pulmonary:     Effort: Pulmonary effort is normal. No respiratory distress.     Breath sounds: Normal breath sounds. No wheezing or rales.  Chest:     Chest wall: No tenderness.  Abdominal:     General: Bowel sounds are normal.     Palpations: Abdomen is soft.     Tenderness: There is no abdominal tenderness.  Musculoskeletal: Normal range of motion.     Comments: Moderate left hip tenderness. No bony deformity or abnormality noted. Patient needing to walk with limp favoring the left side.   Lymphadenopathy:     Cervical: No cervical adenopathy.  Skin:    General: Skin is warm and dry.     Capillary Refill: Capillary refill takes 2 to 3 seconds.  Neurological:     Mental Status: She is alert and oriented to person, place, and time.  Cranial Nerves: No cranial nerve deficit.  Psychiatric:        Behavior: Behavior normal.        Thought Content: Thought content normal.        Judgment: Judgment normal.   Assessment/Plan: 1. Essential hypertension Stable. Continue bp medication as prescribed   2. Inflammatory polyarthritis ([place]) New, short term prescription given for oxycodone 5mg  tablets. May be taken up to edvery four hours as needed for severe pain. Advised patient to take only if not working or not driving. Reviewed risks and possible side effects  associated with taking opiates, benzodiazepines and other CNS depressants. Combination of these could cause dizziness and drowsiness. Advised patient not to drive or operate machinery when taking these medications, as patient's and other's life can be at risk and will have consequences. Patient verbalized understanding in this matter. Dependence and abuse for these drugs will be monitored closely. A Controlled substance policy and procedure is on file which allows [facility] to order a urine drug screen test at any visit. Patient understands and agrees with the plan - oxyCODONE (OXY IR/ROXICODONE) 5 MG immediate release tablet; Take 1 tablet (5 mg total) by mouth every 4 (four) hours as needed for severe pain.  Dispense: 30 tablet; Refill: 0  3. Encounter for long-term (current) use of medications - POCT Urine Drug Screen appropriately negative for all controlled substances.   General Counseling: klea nall understanding of the findings of todays visit and agrees with plan of treatment. I have discussed any further diagnostic evaluation that may be needed or ordered today. We also reviewed her medications today. she has been encouraged to call the office with any questions or concerns that should arise related to todays visit.  This patient was seen by [name] FNP Collaboration with Dr [name] as a part of collaborative care agreement  Orders Placed This Encounter  Procedures  . POCT Urine Drug Screen    Meds ordered this encounter  Medications  . oxyCODONE (OXY IR/ROXICODONE) 5 MG immediate release tablet    Sig: Take 1 tablet (5 mg total) by mouth every 4 (four) hours as needed for severe pain.    Dispense:  30 tablet    Refill:  0    Order Specific Question:   Supervising Provider    Answer:   [name] [[id number]]    Time spent: 67 Minutes      Dr [name] Internal medicine

## 2019-01-22 DIAGNOSIS — Z79899 Other long term (current) drug therapy: Secondary | ICD-10-CM | POA: Insufficient documentation

## 2019-02-03 ENCOUNTER — Telehealth: Payer: Self-pay

## 2019-02-03 ENCOUNTER — Other Ambulatory Visit: Payer: Self-pay | Admitting: Nurse Practitioner

## 2019-02-03 DIAGNOSIS — M064 Inflammatory polyarthropathy: Secondary | ICD-10-CM

## 2019-02-03 MED ORDER — TRAMADOL HCL 50 MG PO TABS: 50.0000 mg | ORAL_TABLET | Freq: Three times a day (TID) | ORAL | 1 refills | Status: DC | PRN

## 2019-02-03 NOTE — Progress Notes (Signed)
Refilled tramadol and sent to [facility].

## 2019-02-03 NOTE — Telephone Encounter (Signed)
Refilled tramadol and sent to [facility].

## 2019-02-03 NOTE — Telephone Encounter (Signed)
Called pt and lmom

## 2019-02-07 ENCOUNTER — Other Ambulatory Visit: Payer: Self-pay | Admitting: Nurse Practitioner

## 2019-02-07 DIAGNOSIS — E559 Vitamin D deficiency, unspecified: Secondary | ICD-10-CM | POA: Diagnosis not present

## 2019-02-07 DIAGNOSIS — I10 Essential (primary) hypertension: Secondary | ICD-10-CM | POA: Diagnosis not present

## 2019-02-07 DIAGNOSIS — E782 Mixed hyperlipidemia: Secondary | ICD-10-CM | POA: Diagnosis not present

## 2019-02-07 DIAGNOSIS — Z0001 Encounter for general adult medical examination with abnormal findings: Secondary | ICD-10-CM | POA: Diagnosis not present

## 2019-02-08 LAB — TSH: TSH: 1.25 u[IU]/mL (ref 0.450–4.500)

## 2019-02-08 LAB — CBC
Hematocrit: 37.3 % (ref 34.0–46.6)
Hemoglobin: 12.8 g/dL (ref 11.1–15.9)
MCH: 33.9 pg — ABNORMAL HIGH (ref 26.6–33.0)
MCHC: 34.3 g/dL (ref 31.5–35.7)
MCV: 99 fL — ABNORMAL HIGH (ref 79–97)
Platelets: 144 10*3/uL — ABNORMAL LOW (ref 150–450)
RBC: 3.78 x10E6/uL (ref 3.77–5.28)
RDW: 12.2 % (ref 11.7–15.4)
WBC: 4.6 10*3/uL (ref 3.4–10.8)

## 2019-02-08 LAB — COMPREHENSIVE METABOLIC PANEL
ALT: 27 IU/L (ref 0–32)
AST: 38 IU/L (ref 0–40)
Albumin/Globulin Ratio: 1.6 (ref 1.2–2.2)
Albumin: 4.4 g/dL (ref 3.7–4.7)
Alkaline Phosphatase: 80 IU/L (ref 39–117)
BUN/Creatinine Ratio: 25 (ref 12–28)
BUN: 20 mg/dL (ref 8–27)
Bilirubin Total: 0.7 mg/dL (ref 0.0–1.2)
CO2: 30 mmol/L — ABNORMAL HIGH (ref 20–29)
Calcium: 10.3 mg/dL (ref 8.7–10.3)
Chloride: 96 mmol/L (ref 96–106)
Creatinine, Ser: 0.8 mg/dL (ref 0.57–1.00)
GFR calc Af Amer: 81 mL/min/{1.73_m2} (ref >59)
GFR calc non Af Amer: 70 mL/min/{1.73_m2} (ref >59)
Globulin, Total: 2.8 g/dL (ref 1.5–4.5)
Glucose: 109 mg/dL — ABNORMAL HIGH (ref 65–99)
Potassium: 4.4 mmol/L (ref 3.5–5.2)
Sodium: 139 mmol/L (ref 134–144)
Total Protein: 7.2 g/dL (ref 6.0–8.5)

## 2019-02-08 LAB — LIPID PANEL W/O CHOL/HDL RATIO
Cholesterol, Total: 133 mg/dL (ref 100–199)
HDL: 67 mg/dL (ref >39)
LDL Calculated: 43 mg/dL (ref 0–99)
Triglycerides: 114 mg/dL (ref 0–149)
VLDL Cholesterol Cal: 23 mg/dL (ref 5–40)

## 2019-02-08 LAB — T4, FREE: Free T4: 1.85 ng/dL — ABNORMAL HIGH (ref 0.82–1.77)

## 2019-02-08 LAB — VITAMIN D 25 HYDROXY (VIT D DEFICIENCY, FRACTURES): Vit D, 25-Hydroxy: 26.6 ng/mL — ABNORMAL LOW (ref 30.0–100.0)

## 2019-02-09 ENCOUNTER — Ambulatory Visit (INDEPENDENT_AMBULATORY_CARE_PROVIDER_SITE_OTHER): Payer: Medicare HMO | Admitting: Nurse Practitioner

## 2019-02-09 ENCOUNTER — Encounter: Payer: Self-pay | Admitting: Nurse Practitioner

## 2019-02-09 VITALS — BP 113/45 | HR 62 | Resp 16 | Ht 63.0 in | Wt 208.0 lb

## 2019-02-09 DIAGNOSIS — Z0001 Encounter for general adult medical examination with abnormal findings: Secondary | ICD-10-CM

## 2019-02-09 DIAGNOSIS — I10 Essential (primary) hypertension: Secondary | ICD-10-CM

## 2019-02-09 DIAGNOSIS — R300 Dysuria: Secondary | ICD-10-CM

## 2019-02-09 DIAGNOSIS — Z79899 Other long term (current) drug therapy: Secondary | ICD-10-CM | POA: Diagnosis not present

## 2019-02-09 DIAGNOSIS — K760 Fatty (change of) liver, not elsewhere classified: Secondary | ICD-10-CM | POA: Diagnosis not present

## 2019-02-09 DIAGNOSIS — M064 Inflammatory polyarthropathy: Secondary | ICD-10-CM | POA: Diagnosis not present

## 2019-02-09 MED ORDER — OXYCODONE HCL 5 MG PO TABS: 5.0000 mg | ORAL_TABLET | ORAL | 0 refills | Status: DC | PRN

## 2019-02-09 NOTE — Progress Notes (Signed)
[facility] [place], [place] [address]  Internal MEDICINE  Office Visit Note  Patient Name: Martha Peterson  [id number]  [id number]  Date of Service: 02/12/2019   Pt is here for routine health maintenance examination  Chief Complaint  Patient presents with  . Annual Exam  . Hypertension  . Hyperlipidemia  . Arthritis  . Medication Refill    may need oxycodone has 14 tabs left     The patient is here for health maintenance exam. Overall, she is doing well. She had labs drawn prior to this visit. Her electrolytes, renal functions and liver functions were all normal.  Platelet count is improving. Her TSH is normal. The T4 is still elevated. The patient feels well. She has chronic joint pain and rheumatoid arthritis. She does see rheumatology again in October. She alternates between tramadol and oxycodone, to handle her joint pain as she cannot tolerate NSAIDs.     Current Medication: Outpatient Encounter Medications as of 02/09/2019  Medication Sig Note  . allopurinol (ZYLOPRIM) 100 MG tablet Take 2 tablets (200 mg total) by mouth daily.   Marland Kitchen ascorbic acid (VITAMIN C) 500 MG tablet Take 500 mg by mouth daily.   Marland Kitchen aspirin EC 81 MG tablet Take 81 mg by mouth daily.   Marland Kitchen atorvastatin (LIPITOR) 10 MG tablet Take 1 tablet (10 mg total) by mouth daily at 6 PM.   . carvedilol (COREG) 25 MG tablet Take 0.5 tablets (12.5 mg total) by mouth 2 (two) times daily with a meal.   . cholecalciferol (VITAMIN D) 400 UNITS TABS tablet Take 1,000 Units by mouth.   . cloNIDine (CATAPRES) 0.1 MG tablet Take 1 tablet (0.1 mg total) by mouth 2 (two) times daily.   . diclofenac sodium (VOLTAREN) 1 % GEL Apply 3 grams to 3 large joints, up to 3 times daily as needed.   . furosemide (LASIX) 40 MG tablet Take one tablet by mouth two times daily   . Ginger, Zingiber officinalis, (GINGER PO) Take by mouth.   Marland Kitchen ibuprofen (ADVIL,MOTRIN) 600 MG tablet Take 1 tablet (600 mg total) by  mouth every 8 (eight) hours as needed.   Marland Kitchen levocetirizine (XYZAL) 5 MG tablet TAKE 1 TABLET EVERY DAY  FOR  ALLERGIES   . levothyroxine (SYNTHROID) 137 MCG tablet Take 1 tablet (137 mcg total) by mouth daily before breakfast.   . losartan-hydrochlorothiazide (HYZAAR) 100-25 MG tablet Take 1 tablet by mouth daily.   . magnesium oxide (MAG-OX) 400 MG tablet Take 400 mg by mouth daily.  08/20/2015: Received from: External Pharmacy  . meclizine (ANTIVERT) 12.5 MG tablet Take 12.5 mg by mouth as needed.    . Misc Natural Products (TART CHERRY ADVANCED PO) Take by mouth daily.   Marland Kitchen omega-3 acid ethyl esters (LOVAZA) 1 G capsule Take 1 g by mouth 2 (two) times daily.   Marland Kitchen omeprazole (PRILOSEC) 20 MG capsule Take one capsule by mouth two times daily   . oxyCODONE (OXY IR/ROXICODONE) 5 MG immediate release tablet Take 1 tablet (5 mg total) by mouth every 4 (four) hours as needed for severe pain.   . potassium chloride (K-DUR) 10 MEQ tablet Take 1 tablet (10 mEq total) by mouth daily.   . predniSONE (DELTASONE) 5 MG tablet Take 2 tablets (10 mg total) by mouth daily with breakfast.   . tiZANidine (ZANAFLEX) 4 MG tablet Take 1 tablet (4 mg total) by mouth 2 (two) times daily.   . traMADol (ULTRAM) 50  MG tablet Take 1 tablet (50 mg total) by mouth every 8 (eight) hours as needed.   . TURMERIC PO Take by mouth daily.   . vitamin B-12 (CYANOCOBALAMIN) 100 MCG tablet    . vitamin E 400 UNIT capsule Take 400 Units by mouth 2 (two) times daily.  08/20/2015: Received from: External Pharmacy  . Zoster Vaccine Adjuvanted [facility]) injection Shingles vaccine series - inject IM as directed .   . [DISCONTINUED] oxyCODONE (OXY IR/ROXICODONE) 5 MG immediate release tablet Take 1 tablet (5 mg total) by mouth every 4 (four) hours as needed for severe pain.   . [DISCONTINUED] Potassium Gluconate 595 MG CAPS Take by mouth.   . [DISCONTINUED] Vitamin E 400 units TABS     No facility-administered encounter medications on file as  of 02/09/2019.     Surgical History: Past Surgical History:  Procedure Laterality Date  . ABDOMINAL HYSTERECTOMY    . ANKLE SURGERY Left   . APPENDECTOMY    . CHOLECYSTECTOMY    . COLONOSCOPY WITH PROPOFOL N/A 12/04/2014   Procedure: COLONOSCOPY WITH PROPOFOL;  Surgeon: Lollie Sails, MD;  Location: [facility] END[facility]COPY;  Service: Endoscopy;  Laterality: N/A;  . FRACTURE SURGERY     Femur Fx; LT Ankle Pinning  . JOINT REPLACEMENT     RT TKR  . Patial Thyroid Resection      Medical History: Past Medical History:  Diagnosis Date  . Arthritis   . COPD (chronic obstructive pulmonary disease) ([place])   . Diverticulitis   . Gout   . Hyperlipidemia   . Hypertension   . Hypothyroidism   . 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 Microhematuria   . Mild mitral regurgitation   . Mild tricuspid regurgitation   . Osteopenia   . Pulmonary fibrosis ([place])   . Urinary incontinence     Family History: Family History  Problem Relation Age of Onset  . Heart Problems Mother   . Heart Problems Father   . Diabetes Father   . Lupus Sister   . Thyroid disease Sister   . Heart Problems Sister   . Hypertension Son   . Diabetes Son        borderline   . Arthritis Daughter        in the left knee   . Diabetes Daughter   . Neuropathy Daughter   . Depression Daughter   . Anxiety disorder Daughter       Review of Systems  Constitutional: Positive for fatigue. Negative for activity change, chills and unexpected weight change.  HENT: Negative for congestion, postnasal drip, sinus pressure, sinus pain and sore throat.   Respiratory: Negative for cough, shortness of breath and wheezing.   Cardiovascular: Positive for palpitations and leg swelling. Negative for chest pain.  Gastrointestinal: Negative for constipation, diarrhea, nausea and vomiting.  Endocrine: Negative for cold intolerance, heat intolerance, polydipsia and polyuria.  Musculoskeletal: Positive for  arthralgias, back pain, joint swelling and myalgias.       Moderate to severe pain in left hip. Improved since her most recent visit. Walking better, no longer using cane to help with mobility.   Skin: Negative for color change and rash.  Allergic/Immunologic: Positive for environmental allergies.  Neurological: Positive for weakness and headaches. Negative for dizziness.  Hematological: Negative for adenopathy. Does not bruise/bleed easily.  Psychiatric/Behavioral: Negative for dysphoric mood. The patient is not nervous/anxious.      Today's Vitals   02/09/19 1449  BP: (!) 113/45  Pulse: 62  Resp: 16  SpO2: 96%  Weight: 208 lb (94.3 kg)  Height: 5\' 3"  (1.6 m)   Body mass index is 36.85 kg/m.  Physical Exam Vitals signs and nursing note reviewed.  Constitutional:      General: She is not in acute distress.    Appearance: Normal appearance. She is well-developed. She is not diaphoretic.  HENT:     Head: Normocephalic and atraumatic.     Mouth/Throat:     Pharynx: No oropharyngeal exudate.  Eyes:     Pupils: Pupils are equal, round, and reactive to light.  Neck:     Musculoskeletal: Normal range of motion and neck supple.     Thyroid: No thyromegaly.     Vascular: No carotid bruit or JVD.     Trachea: No tracheal deviation.  Cardiovascular:     Rate and Rhythm: Normal rate and regular rhythm.     Pulses: Normal pulses.     Heart sounds: Murmur present. Systolic murmur present with a grade of 2/6. No friction rub. No gallop.      Comments: Moderate, non-pitting edema in left lower leg and ankle.  Pulmonary:     Effort: Pulmonary effort is normal. No respiratory distress.     Breath sounds: Normal breath sounds. No wheezing or rales.  Chest:     Chest wall: No tenderness.     Breasts:        Right: Normal. No swelling, bleeding, inverted nipple, mass, nipple discharge, skin change or tenderness.        Left: Normal. No swelling, bleeding, inverted nipple, mass, nipple  discharge, skin change or tenderness.  Abdominal:     General: Bowel sounds are normal.     Palpations: Abdomen is soft.     Tenderness: There is no abdominal tenderness.  Musculoskeletal: Normal range of motion.     Comments: Moderate left hip tenderness. No bony deformity or abnormality noted. Patient needing to walk with limp favoring the left side.   Lymphadenopathy:     Cervical: No cervical adenopathy.  Skin:    General: Skin is warm and dry.     Capillary Refill: Capillary refill takes 2 to 3 seconds.  Neurological:     Mental Status: She is alert and oriented to person, place, and time.     Cranial Nerves: No cranial nerve deficit.  Psychiatric:        Behavior: Behavior normal.        Thought Content: Thought content normal.        Judgment: Judgment normal.    Depression screen [facility] 2/9 02/09/2019 01/10/2019 11/08/2018 08/08/2018 04/26/2018  Decreased Interest 0 0 0 0 0  Down, Depressed, Hopeless 0 0 0 0 0  PHQ - 2 Score 0 0 0 0 0    Functional Status Survey: Is the patient deaf or have difficulty hearing?: No Does the patient have difficulty seeing, even when wearing glasses/contacts?: No Does the patient have difficulty concentrating, remembering, or making decisions?: No Does the patient have difficulty walking or climbing stairs?: Yes(has trouble with stairs) Does the patient have difficulty dressing or bathing?: No Does the patient have difficulty doing errands alone such as visiting a doctor's office or shopping?: No  MMSE - [place] Exam 02/09/2019 01/24/2018  Orientation to time 5 5  Orientation to Place 5 5  Registration 3 3  Attention/ Calculation 5 5  Recall 3 3  Language- name 2 objects 2 2  Language- repeat 1  1  Language- follow 3 step command 3 3  Language- read & follow direction 1 1  Write a sentence 1 1  Copy design 1 1  Total score 30 30    Fall Risk  02/09/2019 01/10/2019 11/08/2018 08/08/2018 04/26/2018  Falls in the past year? 0 0 0 0 0       LABS: Recent Results (from the past 2160 hour(s))  POCT Urine Drug Screen     Status: None   Collection Time: 01/10/19  2:32 PM  Result Value Ref Range   POC METHAMPHETAMINE UR None Detected None Detected   POC Opiate Ur None Detected None Detected   POC Barbiturate UR None Detected None Detected   POC Amphetamine UR None Detected None Detected   POC Oxycodone UR None Detected None Detected   POC Cocaine UR None Detected None Detected   POC Ecstasy UR None Detected None Detected   POC TRICYCLICS UR None Detected None Detected   POC PHENCYCLIDINE UR None Detected None Detected   POC MARIJUANA UR None Detected None Detected   POC METHADONE UR None Detected None Detected   POC BENZODIAZEPINES UR None Detected None Detected   URINE TEMPERATURE     POC DRUG SCREEN OXIDANTS URINE     POC SPECIFIC GRAVITY URINE     POC PH URINE     Methylenedioxyamphetamine    Comprehensive metabolic panel     Status: Abnormal   Collection Time: 02/07/19 11:21 AM  Result Value Ref Range   Glucose 109 (H) 65 - 99 mg/dL   BUN 20 8 - 27 mg/dL   Creatinine, Ser 0.80 0.57 - 1.00 mg/dL   GFR calc non Af Amer 70 >59 mL/min/1.73   GFR calc Af Amer 81 >59 mL/min/1.73   BUN/Creatinine Ratio 25 12 - 28   Sodium 139 134 - 144 mmol/L   Potassium 4.4 3.5 - 5.2 mmol/L   Chloride 96 96 - 106 mmol/L   CO2 30 (H) 20 - 29 mmol/L   Calcium 10.3 8.7 - 10.3 mg/dL   Total Protein 7.2 6.0 - 8.5 g/dL   Albumin 4.4 3.7 - 4.7 g/dL   Globulin, Total 2.8 1.5 - 4.5 g/dL   Albumin/Globulin Ratio 1.6 1.2 - 2.2   Bilirubin Total 0.7 0.0 - 1.2 mg/dL   Alkaline Phosphatase 80 39 - 117 IU/L   AST 38 0 - 40 IU/L   ALT 27 0 - 32 IU/L  CBC     Status: Abnormal   Collection Time: 02/07/19 11:21 AM  Result Value Ref Range   WBC 4.6 3.4 - 10.8 x10E3/uL   RBC 3.78 3.77 - 5.28 x10E6/uL   Hemoglobin 12.8 11.1 - 15.9 g/dL   Hematocrit 37.3 34.0 - 46.6 %   MCV 99 (H) 79 - 97 fL   MCH 33.9 (H) 26.6 - 33.0 pg   MCHC 34.3 31.5  - 35.7 g/dL   RDW 12.2 11.7 - 15.4 %   Platelets 144 (L) 150 - 450 x10E3/uL  Lipid Panel w/o Chol/HDL Ratio     Status: None   Collection Time: 02/07/19 11:21 AM  Result Value Ref Range   Cholesterol, Total 133 100 - 199 mg/dL   Triglycerides 114 0 - 149 mg/dL   HDL 67 >39 mg/dL   VLDL Cholesterol Cal 23 5 - 40 mg/dL   LDL Calculated 43 0 - 99 mg/dL    Comment: **Effective February 13, 2019, [facility] is implementing an improved** equation to calculate Low Density  Lipoprotein Cholesterol (LDL-C) concentrations, to be used in all lipid panels that report calculated LDL-C. This equation was developed through a collaboration with the [employer], Lung and [place] (NIH).[1] The NIH calculation overcomes the limitations of the existing [place]ew[place] LDL-C equation and performs equally well in both fasting and non-fasting individuals. 1. Pauline Good Q, et al. A new equation for calculation of low-density lipoprotein cholesterol in patients with normolipidemia and/or hypertriglyceridemia. JAMA Cardiol. 2020 Feb 26. doi:10.1001/jamacardio.2020.0013   T4, free     Status: Abnormal   Collection Time: 02/07/19 11:21 AM  Result Value Ref Range   Free T4 1.85 (H) 0.82 - 1.77 ng/dL  TSH     Status: None   Collection Time: 02/07/19 11:21 AM  Result Value Ref Range   TSH 1.250 0.450 - 4.500 uIU/mL  VITAMIN D 25 Hydroxy (Vit-D Deficiency, Fractures)     Status: Abnormal   Collection Time: 02/07/19 11:21 AM  Result Value Ref Range   Vit D, 25-Hydroxy 26.6 (L) 30.0 - 100.0 ng/mL    Comment: Vitamin D deficiency has been defined by the [place] and an Endocrine Society practice guideline as a level of serum 25-OH vitamin D less than 20 ng/mL (1,2). The Endocrine Society went on to further define vitamin D insufficiency as a level between 21 and 29 ng/mL (2). 1. IOM (Institute of Medicine). 2010. Dietary reference    intakes for calcium and D. [place]: The    [employer]. 2. Holick MF, Binkley Grafton, Bischoff-Ferrari HA, et al.    Evaluation, treatment, and prevention of vitamin D    deficiency: an Endocrine Society clinical practice    guideline. JCEM. 2011 Jul; 96(7):1911-30.   Urinalysis, Routine w reflex microscopic     Status: None   Collection Time: 02/09/19  2:50 PM  Result Value Ref Range   Specific Gravity, UA 1.008 1.005 - 1.030   pH, UA 7.5 5.0 - 7.5   Color, UA Yellow Yellow   Appearance Ur Clear Clear   Leukocytes,UA Negative Negative   Protein,UA Negative Negative/Trace   Glucose, UA Negative Negative   Ketones, UA Negative Negative   RBC, UA Negative Negative   Bilirubin, UA Negative Negative   Urobilinogen, Ur 0.2 0.2 - 1.0 mg/dL   Nitrite, UA Negative Negative   Microscopic Examination Comment     Comment: Microscopic not indicated and not performed.   Assessment/Plan: 1. Encounter for general adult medical examination with abnormal findings Annual health maintenance exam today.   2. Essential hypertension Stable. Continue bp medication as prescribed.   3. Nonalcoholic fatty liver disease Most recent liver panel normal. Will get repeat abdominal ultrasound for surveillance.  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 A new prescription for #30 tablets sent to her pharmacy.  - oxyCODONE (OXY IR/ROXICODONE) 5 MG immediate release tablet; Take 1 tablet (5 mg total) by mouth every 4 (four) hours as needed for severe pain.  Dispense: 30 tablet; Refill: 0  5. Dysuria - Urinalysis, Routine w reflex microscopic  General Counseling: Anis verbalizes understanding of the findings of todays visit and agrees with plan of treatment. I have discussed any further diagnostic evaluation that may be needed or ordered today. We also reviewed her medications today. she has been encouraged to call the  office with any questions or concerns that should arise related  to todays visit.    Counseling:  Hypertension Counseling:   The following hypertensive lifestyle modification were recommended and discussed:  1. Limiting alcohol intake to less than 1 oz/day of ethanol:(24 oz of beer or 8 oz of wine or 2 oz of 100-proof whiskey). 2. Take baby ASA 81 mg daily. 3. Importance of regular aerobic exercise and losing weight. 4. Reduce dietary saturated fat and cholesterol intake for overall cardiovascular health. 5. Maintaining adequate dietary potassium, calcium, and magnesium intake. 6. Regular monitoring of the blood pressure. 7. Reduce sodium intake to less than 100 mmol/day (less than 2.3 gm of sodium or less than 6 gm of sodium choride)   This patient was seen by [place] with Dr [name] as a part of collaborative care agreement  Orders Placed This Encounter  Procedures  . US Abdomen Complete  . Urinalysis, Routine w reflex microscopic    Meds ordered this encounter  Medications  . oxyCODONE (OXY IR/ROXICODONE) 5 MG immediate release tablet    Sig: Take 1 tablet (5 mg total) by mouth every 4 (four) hours as needed for severe pain.    Dispense:  30 tablet    Refill:  0    Order Specific Question:   Supervising Provider    Answer:   [name] [id number]    Time spent: Biehle, MD  Internal Medicine

## 2019-02-09 NOTE — Progress Notes (Signed)
Discuss labs at visit 02/09/2019

## 2019-02-10 LAB — URINALYSIS, ROUTINE W REFLEX MICROSCOPIC
Bilirubin, UA: NEGATIVE
Glucose, UA: NEGATIVE
Ketones, UA: NEGATIVE
Leukocytes,UA: NEGATIVE
Nitrite, UA: NEGATIVE
Protein,UA: NEGATIVE
RBC, UA: NEGATIVE
Specific Gravity, UA: 1.008 (ref 1.005–1.030)
Urobilinogen, Ur: 0.2 mg/dL (ref 0.2–1.0)
pH, UA: 7.5 (ref 5.0–7.5)

## 2019-02-12 DIAGNOSIS — K760 Fatty (change of) liver, not elsewhere classified: Secondary | ICD-10-CM | POA: Insufficient documentation

## 2019-02-12 DIAGNOSIS — R300 Dysuria: Secondary | ICD-10-CM | POA: Insufficient documentation

## 2019-02-17 ENCOUNTER — Other Ambulatory Visit: Payer: Self-pay

## 2019-02-17 MED ORDER — FUROSEMIDE 40 MG PO TABS: ORAL_TABLET | ORAL | 1 refills | Status: DC

## 2019-02-17 MED ORDER — OMEPRAZOLE 20 MG PO CPDR: DELAYED_RELEASE_CAPSULE | ORAL | 1 refills | Status: DC

## 2019-02-23 ENCOUNTER — Other Ambulatory Visit: Payer: Self-pay

## 2019-02-23 MED ORDER — LOSARTAN POTASSIUM-HCTZ 100-25 MG PO TABS: 1.0000 | ORAL_TABLET | Freq: Every day | ORAL | 1 refills | Status: DC

## 2019-03-03 ENCOUNTER — Other Ambulatory Visit: Payer: Medicare HMO

## 2019-03-13 ENCOUNTER — Other Ambulatory Visit: Payer: Self-pay

## 2019-03-13 MED ORDER — TIZANIDINE HCL 4 MG PO TABS: 4.0000 mg | ORAL_TABLET | Freq: Two times a day (BID) | ORAL | 1 refills | Status: DC

## 2019-03-24 ENCOUNTER — Other Ambulatory Visit: Payer: Medicare HMO

## 2019-03-24 NOTE — Progress Notes (Signed)
Virtual Visit via Telephone Note  I connected with Martha Peterson on 04/07/19 at 12:00 PM EDT by telephone and verified that I am speaking with the correct person using two identifiers.  Location: Patient: Home  Provider: Clinic  This service was conducted via virtual visit.  The patient was located at home. I was located in my office.  Consent was obtained prior to the virtual visit and is aware of possible charges through their insurance for this visit.  The patient is an established patient.  Dr. Estanislado Pandy, MD conducted the virtual visit and Hazel Sams, PA-C acted as scribe during the service.  Office staff helped with scheduling follow up visits after the service was conducted.   I discussed the limitations, risks, security and privacy concerns of performing an evaluation and management service by telephone and the availability of in person appointments. I also discussed with the patient that there may be a patient responsible charge related to this service. The patient expressed understanding and agreed to proceed.  CC: Pain in both hands  History of Present Illness: Patient is a 78 year old female with a past medical history of osteoarthritis.  She has been experiencing increased pain and swelling in both hands.  She has not changed her level of activity.  She has been experiencing nocturnal pain. She has not been using a CMC joint brace recently. She has intermittent discomfort in the left hip joint.  She has difficulty climbing steps and getting up from a chair.  She has chronic left knee joint pain but her right knee replacement is doing well.    Review of Systems  Constitutional: Negative for fever and malaise/fatigue.  Eyes: Negative for photophobia, pain, discharge and redness.       +Dry eyes (Recently diagnosed with MD)   Respiratory: Negative for cough, shortness of breath and wheezing.   Cardiovascular: Negative for chest pain and palpitations.  Gastrointestinal: Negative  for blood in stool, constipation and diarrhea.  Genitourinary: Negative for dysuria.  Musculoskeletal: Positive for joint pain. Negative for back pain, myalgias and neck pain.       +Joint swelling  +Morning stiffness   Skin: Negative for rash.  Neurological: Negative for dizziness and headaches.  Psychiatric/Behavioral: Negative for depression. The patient is not nervous/anxious and does not have insomnia.       Observations/Objective: Physical Exam  Constitutional: She is oriented to person, place, and time.  Neurological: She is alert and oriented to person, place, and time.  Psychiatric: Mood, memory, affect and judgment normal.    Patient reports morning stiffness for 2 hours.   Patient reports nocturnal pain.  Difficulty dressing/grooming: Denies Difficulty climbing stairs: Reports Difficulty getting out of chair: Reports Difficulty using hands for taps, buttons, cutlery, and/or writing: Denies  Assessment and Plan: Primary osteoarthritis of both hands-She is having increased pain in both hands.  She has noticed swelling in both hands for the past 1 week.  She has also been experiencing nocturnal pain. She has not had any difficulty with ADLs.  She experiences pain in bilateral CMC joints.  We will mail her a prescription for bilateral CMC joint braces. She was advised to notify if her hand pain and swelling persists.  She has a history of positive rheumatoid factor.  We will recheck lab work at follow up visit and assess for synovitis at that time.  She will follow up in 1 month.   Rheumatoid factor positive - RF 23.2 on Oct 28, 2017. She  was advised to schedule an in-office appointment if her joint pain and swelling persists so we can assess for synovitis.   Primary osteoarthritis of left hip-She has intermittent discomfort in the left hip.  She walks with a cane to assist with ambulation.   Primary osteoarthritis of left knee joint: Chronic pain.  She does not want to  proceed with a knee replacement at this time.   S/p right knee replacement: Doing well.  She has no discomfort at this time.  History of gout - She is taking allopurinol 200 mg po daily. She will be having a uric acid level drawn   Osteopenia, unspecified location-She has been taking calcium and vitamin D.  Vitamin D was 26.6 on 02/07/19.    Follow Up Instructions: She will follow up in 1 month.  We will mail her a prescription for [facility] joint braces.    I discussed the assessment and treatment plan with the patient. The patient was provided an opportunity to ask questions and all were answered. The patient agreed with the plan and demonstrated an understanding of the instructions.   The patient was advised to call back or seek an in-person evaluation if the symptoms worsen or if the condition fails to improve as anticipated.  I provided 25 minutes of non-face-to-face time during this encounter.   Bo Merino, MD   Scribed by-  Hazel Sams PA-C

## 2019-03-30 ENCOUNTER — Ambulatory Visit: Payer: Medicare HMO | Admitting: Nurse Practitioner

## 2019-03-31 DIAGNOSIS — H26493 Other secondary cataract, bilateral: Secondary | ICD-10-CM | POA: Diagnosis not present

## 2019-04-07 ENCOUNTER — Encounter: Payer: Self-pay | Admitting: Rheumatology

## 2019-04-07 ENCOUNTER — Other Ambulatory Visit: Payer: Self-pay

## 2019-04-07 ENCOUNTER — Telehealth (INDEPENDENT_AMBULATORY_CARE_PROVIDER_SITE_OTHER): Payer: Medicare HMO | Admitting: Rheumatology

## 2019-04-07 DIAGNOSIS — M1712 Unilateral primary osteoarthritis, left knee: Secondary | ICD-10-CM

## 2019-04-07 DIAGNOSIS — M1612 Unilateral primary osteoarthritis, left hip: Secondary | ICD-10-CM | POA: Diagnosis not present

## 2019-04-07 DIAGNOSIS — D472 Monoclonal gammopathy: Secondary | ICD-10-CM | POA: Diagnosis not present

## 2019-04-07 DIAGNOSIS — R768 Other specified abnormal immunological findings in serum: Secondary | ICD-10-CM | POA: Diagnosis not present

## 2019-04-07 DIAGNOSIS — M19041 Primary osteoarthritis, right hand: Secondary | ICD-10-CM

## 2019-04-07 DIAGNOSIS — Z8739 Personal history of other diseases of the musculoskeletal system and connective tissue: Secondary | ICD-10-CM | POA: Diagnosis not present

## 2019-04-07 DIAGNOSIS — M19042 Primary osteoarthritis, left hand: Secondary | ICD-10-CM

## 2019-04-07 DIAGNOSIS — I6523 Occlusion and stenosis of bilateral carotid arteries: Secondary | ICD-10-CM

## 2019-04-07 DIAGNOSIS — Z8639 Personal history of other endocrine, nutritional and metabolic disease: Secondary | ICD-10-CM

## 2019-04-07 DIAGNOSIS — Z8709 Personal history of other diseases of the respiratory system: Secondary | ICD-10-CM

## 2019-04-07 DIAGNOSIS — I517 Cardiomegaly: Secondary | ICD-10-CM | POA: Diagnosis not present

## 2019-04-07 DIAGNOSIS — Z96651 Presence of right artificial knee joint: Secondary | ICD-10-CM | POA: Diagnosis not present

## 2019-04-07 DIAGNOSIS — M8580 Other specified disorders of bone density and structure, unspecified site: Secondary | ICD-10-CM

## 2019-04-07 DIAGNOSIS — I10 Essential (primary) hypertension: Secondary | ICD-10-CM

## 2019-04-13 ENCOUNTER — Other Ambulatory Visit: Payer: Self-pay | Admitting: Internal Medicine

## 2019-04-13 DIAGNOSIS — E039 Hypothyroidism, unspecified: Secondary | ICD-10-CM

## 2019-04-13 MED ORDER — LEVOTHYROXINE SODIUM 137 MCG PO TABS: 137.0000 ug | ORAL_TABLET | Freq: Every day | ORAL | 1 refills | Status: DC

## 2019-04-14 ENCOUNTER — Ambulatory Visit: Payer: Medicare HMO

## 2019-04-14 ENCOUNTER — Other Ambulatory Visit: Payer: Self-pay | Admitting: Nurse Practitioner

## 2019-04-14 ENCOUNTER — Ambulatory Visit (INDEPENDENT_AMBULATORY_CARE_PROVIDER_SITE_OTHER): Payer: Medicare HMO

## 2019-04-14 ENCOUNTER — Other Ambulatory Visit: Payer: Self-pay

## 2019-04-14 DIAGNOSIS — E039 Hypothyroidism, unspecified: Secondary | ICD-10-CM | POA: Diagnosis not present

## 2019-04-14 DIAGNOSIS — K760 Fatty (change of) liver, not elsewhere classified: Secondary | ICD-10-CM | POA: Diagnosis not present

## 2019-04-14 DIAGNOSIS — Z23 Encounter for immunization: Secondary | ICD-10-CM

## 2019-04-15 LAB — URIC ACID: Uric Acid: 6.4 mg/dL (ref 2.5–7.1)

## 2019-04-15 LAB — T4, FREE: Free T4: 1.47 ng/dL (ref 0.82–1.77)

## 2019-04-15 LAB — TSH: TSH: 2.7 u[IU]/mL (ref 0.450–4.500)

## 2019-04-16 IMAGING — US US TRANSVAGINAL NON-OB
1 series · 14 of 25 positions shown · non-contrast
Comparison: CT abdomen and pelvis 08/06/2016

CLINICAL DATA: Bloating, abdominal pain, evaluate ovaries



[Series 1: us transvaginal non-ob · 0.28mm/px · 14 of 27 slices shown]
[im 1/27]
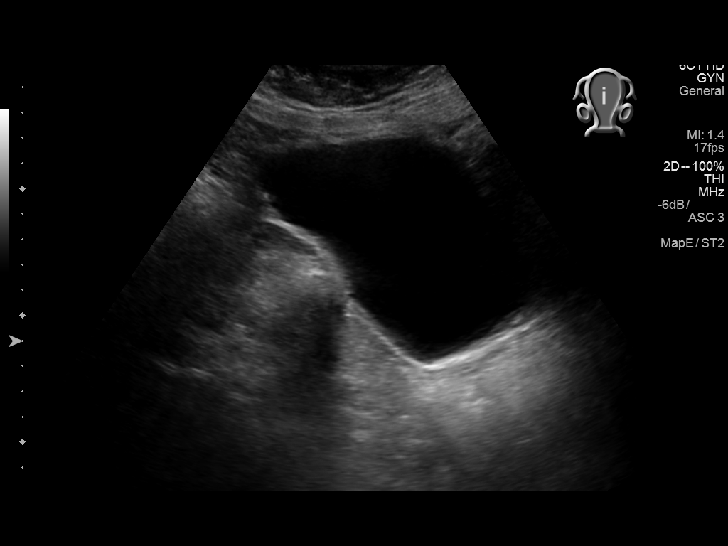
[im 3/27]
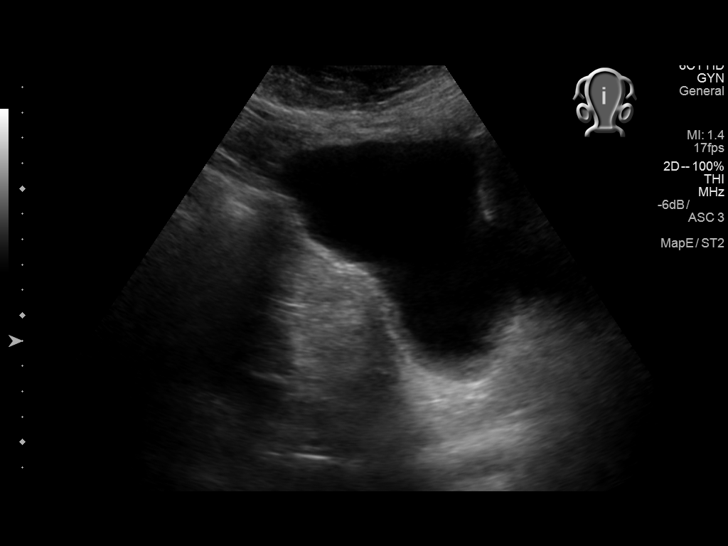
[im 5/27]
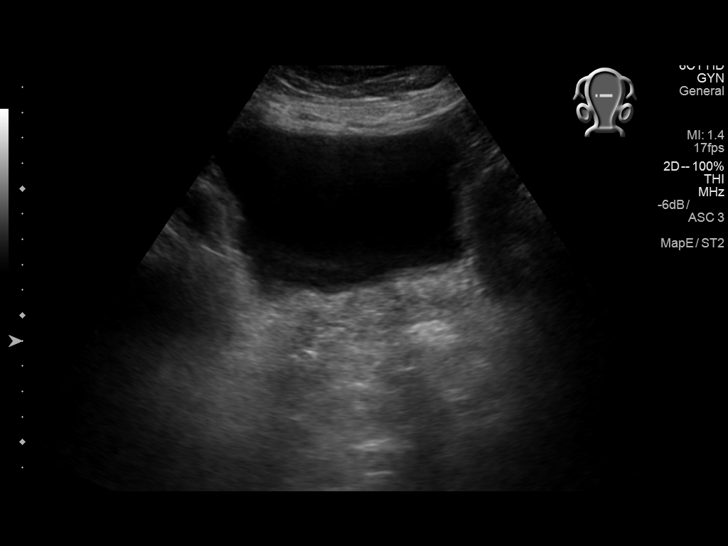
[im 7/27]
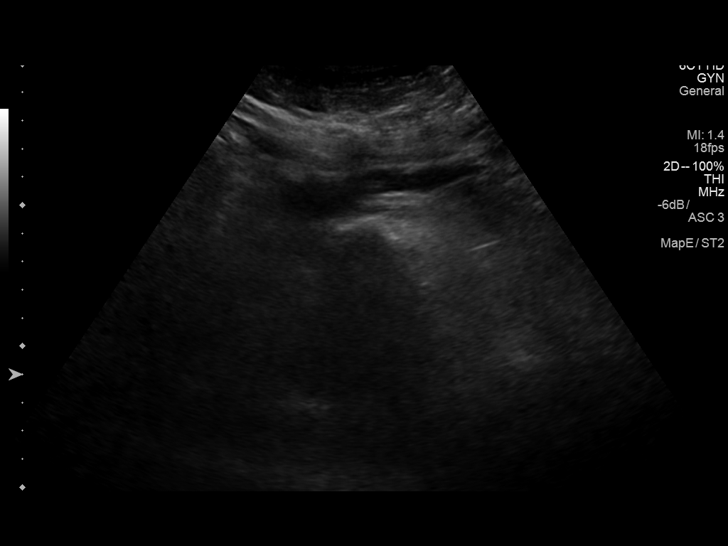
[im 9/27]
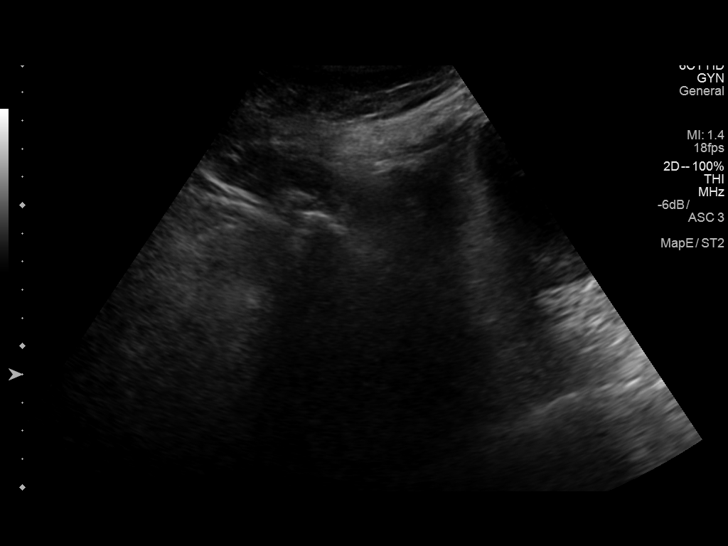
[im 10/27]
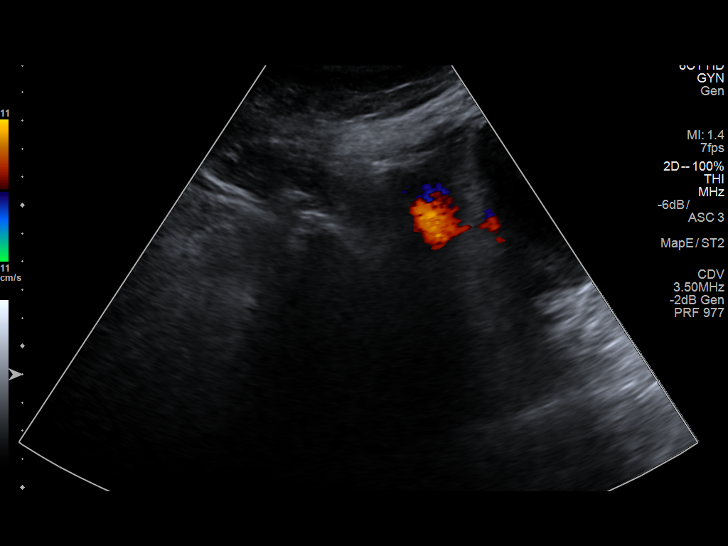
[im 12/27]
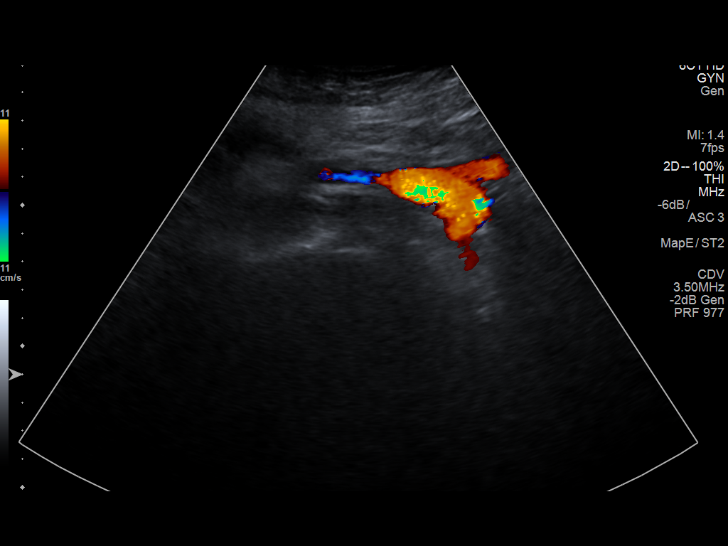
[im 15/27]
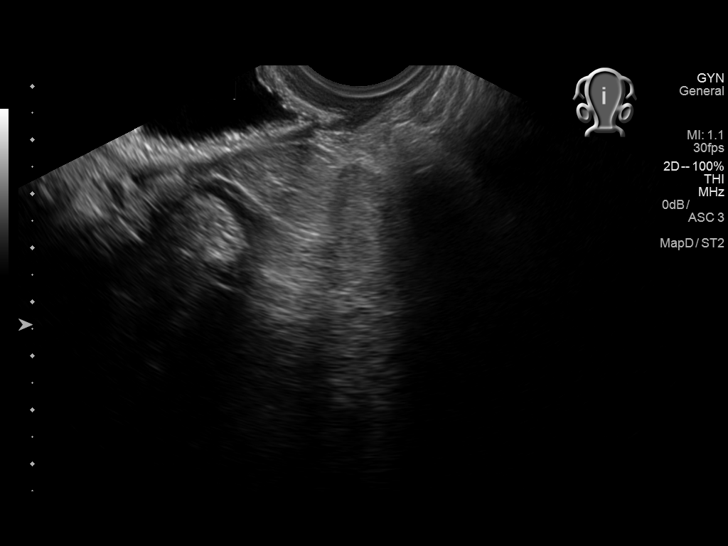
[im 17/27]
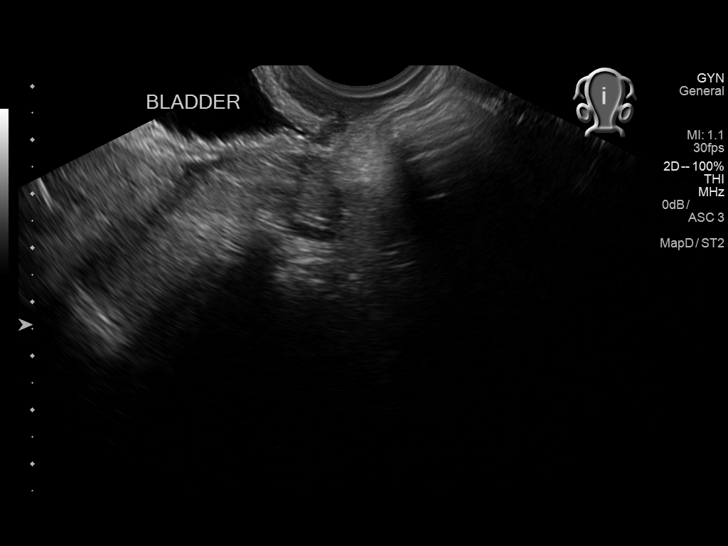
[im 18/27]
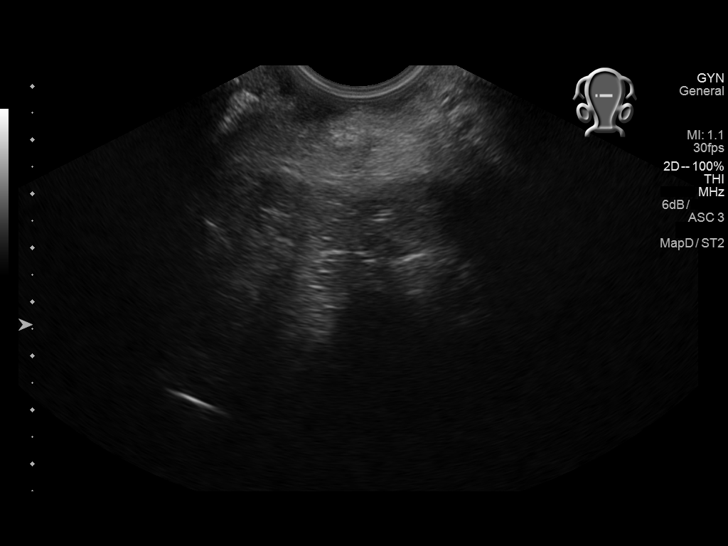
[im 20/27]
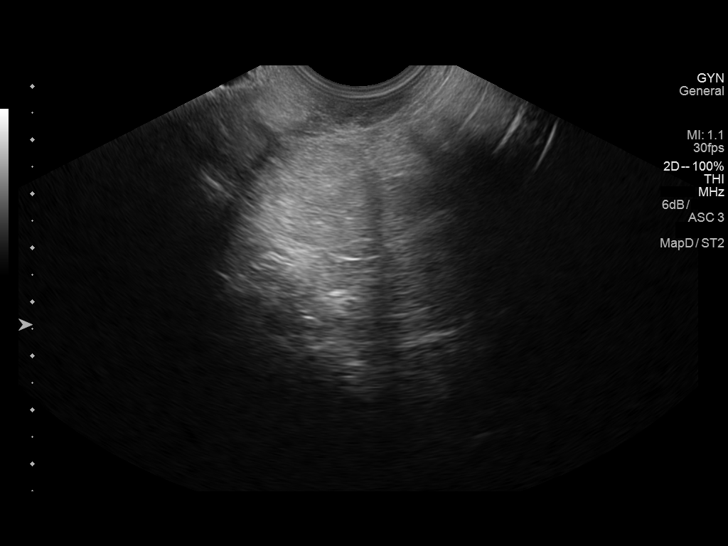
[im 22/27]
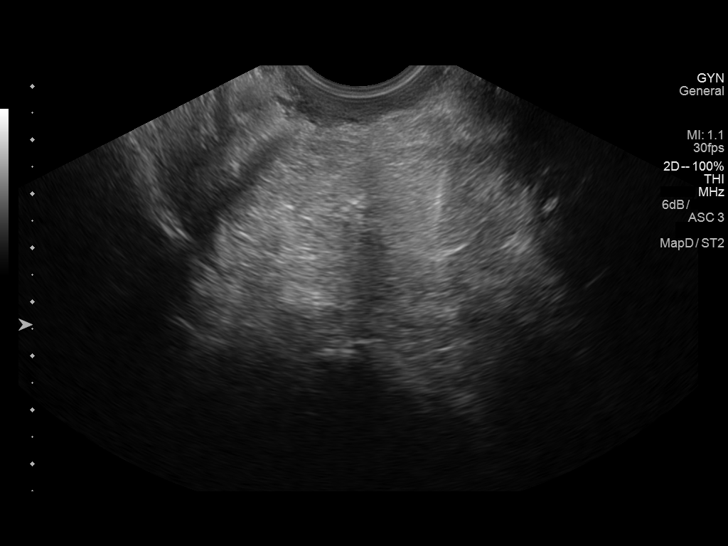
[im 24/27]
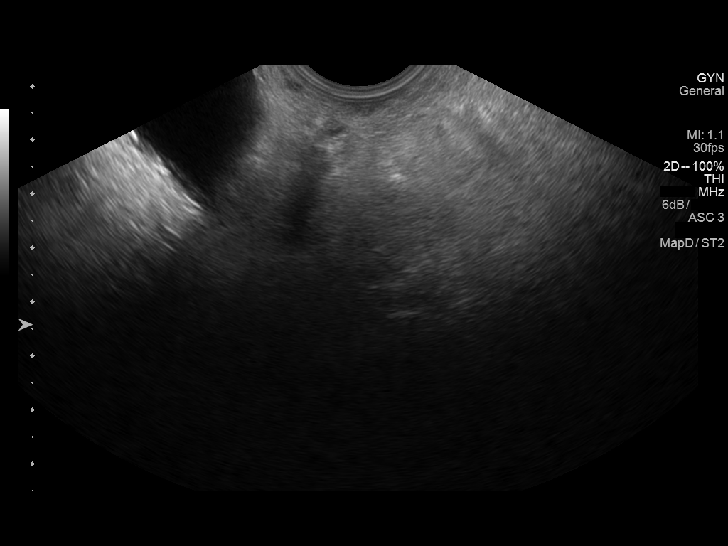
[im 27/27]
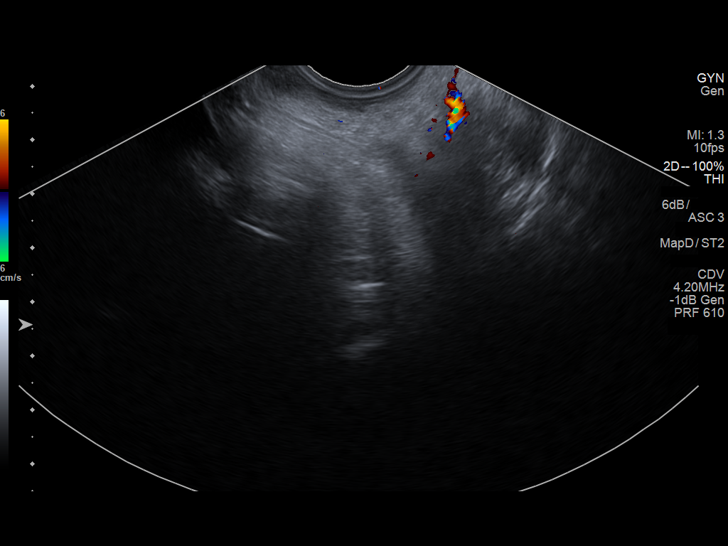

[14 of 25 positions shown; findings below may reference images not displayed]

FINDINGS: Uterus

Surgically absent

Endometrium

N/A

Right ovary

Not visualized on either transabdominal or endovaginal imaging
question due to atrophy versus obscuration by bowel

Left ovary

Not visualized on either transabdominal or endovaginal imaging
question due to atrophy versus obscuration by bowel

Other findings

No free pelvic fluid.

No adnexal masses.
IMPRESSION: Post hysterectomy.

Nonvisualization of ovaries.

No acute abnormalities.

## 2019-04-16 NOTE — Progress Notes (Signed)
Known history of [name]. Review u/s results with patient at visit 04/23/2019

## 2019-04-17 ENCOUNTER — Other Ambulatory Visit: Payer: Self-pay | Admitting: Nurse Practitioner

## 2019-04-17 DIAGNOSIS — Z8739 Personal history of other diseases of the musculoskeletal system and connective tissue: Secondary | ICD-10-CM

## 2019-04-17 MED ORDER — ALLOPURINOL 100 MG PO TABS: 200.0000 mg | ORAL_TABLET | Freq: Every day | ORAL | 1 refills | Status: DC

## 2019-04-17 NOTE — Progress Notes (Signed)
Labs good. Discuss at visit 04/21/2019

## 2019-04-18 ENCOUNTER — Other Ambulatory Visit: Payer: Self-pay | Admitting: Nurse Practitioner

## 2019-04-18 DIAGNOSIS — I10 Essential (primary) hypertension: Secondary | ICD-10-CM

## 2019-04-18 MED ORDER — CLONIDINE HCL 0.1 MG PO TABS: 0.1000 mg | ORAL_TABLET | Freq: Two times a day (BID) | ORAL | 1 refills | Status: DC

## 2019-04-20 NOTE — Progress Notes (Signed)
Office Visit Note  Patient: Martha Peterson             Date of Birth: July 28, 1940           MRN: [id number]             PCP: [name], MD Referring: [name], MD Visit Date: 05/04/2019 Occupation: @GUAROCC @  Subjective:  Pain in left knee.   History of Present Illness: Martha Peterson is a 78 y.o. female with history of osteoarthritis.  She states she continues to have some pain and stiffness in her hands.  She has been using [place] brace which has been helpful.  Her right total knee replacement is doing well.  Her left knee joint continues to hurt.  She states she would like to have a cortisone injection in her left knee joint.  Her left hip joint pain is tolerable.  She continues to have some lower back pain.  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  She denies having any gout flares.  Activities of Daily Living:  Patient reports morning stiffness for 2 hours.   Patient Denies nocturnal pain.  Difficulty dressing/grooming: Denies Difficulty climbing stairs: Reports Difficulty getting out of chair: Denies Difficulty using hands for taps, buttons, cutlery, and/or writing: Denies  Review of Systems  Constitutional: Negative for fatigue, night sweats, weight gain and weight loss.  HENT: Positive for mouth dryness. Negative for mouth sores, trouble swallowing, trouble swallowing and nose dryness.   Eyes: Negative for pain, redness, itching, visual disturbance and dryness.  Respiratory: Negative for cough, shortness of breath and difficulty breathing.   Cardiovascular: Negative for chest pain, palpitations, hypertension, irregular heartbeat and swelling in legs/feet.  Gastrointestinal: Negative for abdominal pain, blood in stool, constipation and diarrhea.  Endocrine: Negative for increased urination.  Genitourinary: Negative for difficulty urinating and vaginal dryness.  Musculoskeletal: Positive for  arthralgias, joint pain and morning stiffness. Negative for joint swelling, myalgias, muscle weakness, muscle tenderness and myalgias.  Skin: Negative for color change, rash, hair loss, skin tightness, ulcers and sensitivity to sunlight.  Allergic/Immunologic: Negative for susceptible to infections.  Neurological: Negative for dizziness, numbness, headaches, memory loss, night sweats and weakness.  Hematological: Negative for bruising/bleeding tendency and swollen glands.  Psychiatric/Behavioral: Negative for depressed mood, confusion and sleep disturbance. The patient is not nervous/anxious.        Stress     PMFS History:  Patient Active Problem List   Diagnosis Date Noted  . Nonalcoholic fatty liver disease 02/12/2019  . Dysuria 02/12/2019  . Encounter for long-term (current) use of high-risk medication 01/22/2019  . Hypokalemia 04/27/2018  . Osteopenia 04/04/2018  . Hypothyroidism (acquired) 02/14/2018  . Neutropenia ([place]) 02/14/2018  . Primary osteoarthritis of left hip 12/06/2017  . Essential hypertension 09/29/2017  . Postural dizziness 09/29/2017  . 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 Acquired left ventricular hypertrophy 09/29/2017  . Inflammatory polyarthritis ([place]) 09/29/2017  . Need for vaccination for Strep pneumoniae 09/29/2017  . H/O ankle fusion 05/02/2016  . 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 Abnormal LFTs 02/13/2014  . 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 Primary osteoarthritis of both hands 02/13/2014  . H/O: gout 02/13/2014  . Rheumatoid factor positive 02/13/2014    Past Medical History:  Diagnosis Date  . Arthritis   . COPD (chronic obstructive pulmonary disease) ([place])   . Diverticulitis   .  Gout   . Hyperlipidemia   . Hypertension   . Hypothyroidism   . 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 Microhematuria   . Mild mitral regurgitation   . Mild  tricuspid regurgitation   . Osteopenia   . Pulmonary fibrosis ([place])   . Urinary incontinence     Family History  Problem Relation Age of Onset  . Heart Problems Mother   . Heart Problems Father   . Diabetes Father   . Lupus Sister   . Thyroid disease Sister   . Heart Problems Sister   . Hypertension Son   . Diabetes Son        borderline   . Arthritis Daughter        in the left knee   . Diabetes Daughter   . Neuropathy Daughter   . Depression Daughter   . Anxiety disorder Daughter    Past Surgical History:  Procedure Laterality Date  . ABDOMINAL HYSTERECTOMY    . ANKLE SURGERY Left   . APPENDECTOMY    . CHOLECYSTECTOMY    . COLONOSCOPY WITH PROPOFOL N/A 12/04/2014   Procedure: COLONOSCOPY WITH PROPOFOL;  Surgeon: Lollie Sails, MD;  Location: [facility] ENDOSCOPY;  Service: Endoscopy;  Laterality: N/A;  . FRACTURE SURGERY     Femur Fx; LT Ankle Pinning  . JOINT REPLACEMENT     RT TKR  . Patial Thyroid Resection     Social History   Social History Narrative  . Not on file   Immunization History  Administered Date(s) Administered  . Influenza Inj Mdck Quad Pf 03/18/2018, 04/14/2019  . Influenza-Unspecified 05/04/2017  . Pneumococcal Conjugate-13 03/01/2018  . Pneumococcal Polysaccharide-23 05/31/2015  . Zoster Recombinat (Shingrix) 01/25/2018, 04/18/2018     Objective: Vital Signs: BP (!) 164/67 (BP Location: Left Arm, Patient Position: Sitting, Cuff Size: Large)   Pulse 63   Resp 13   Ht 5\' 3"  (1.6 m)   Wt 207 lb (93.9 kg)   BMI 36.67 kg/m    Physical Exam Vitals signs and nursing note reviewed.  Constitutional:      Appearance: She is well-developed.  HENT:     Head: Normocephalic and atraumatic.  Eyes:     Conjunctiva/sclera: Conjunctivae normal.  Neck:     Musculoskeletal: Normal range of motion.  Cardiovascular:     Rate and Rhythm: Normal rate and regular rhythm.     Heart sounds: Normal heart sounds.  Pulmonary:     Effort: Pulmonary effort  is normal.     Breath sounds: Normal breath sounds.  Abdominal:     General: Bowel sounds are normal.     Palpations: Abdomen is soft.  Lymphadenopathy:     Cervical: No cervical adenopathy.  Skin:    General: Skin is warm and dry.     Capillary Refill: Capillary refill takes less than 2 seconds.  Neurological:     Mental Status: She is alert and oriented to person, place, and time.  Psychiatric:        Behavior: Behavior normal.      Musculoskeletal Exam: C-spine was in good range of motion.  Shoulder joints and elbow joints are in good range of motion.  No MCP synovitis was noted.  Bilateral CMC, PIP and DIP thickening was noted.  Hip joints with good range of motion.  Her right knee joint is replaced.  Left knee joint has valgus deformity without any warmth or swelling.  CDAI Exam: CDAI Score: - Patient Global: -;  Provider Global: - Swollen: -; Tender: - Joint Exam   No joint exam has been documented for this visit   There is currently no information documented on the homunculus. Go to the Rheumatology activity and complete the homunculus joint exam.  Investigation: No additional findings.  Imaging: No results found.  Recent Labs: Lab Results  Component Value Date   WBC 4.6 02/07/2019   HGB 12.8 02/07/2019   PLT 144 (L) 02/07/2019   NA 139 02/07/2019   K 4.4 02/07/2019   CL 96 02/07/2019   CO2 30 (H) 02/07/2019   GLUCOSE 109 (H) 02/07/2019   BUN 20 02/07/2019   CREATININE 0.80 02/07/2019   BILITOT 0.7 02/07/2019   ALKPHOS 80 02/07/2019   AST 38 02/07/2019   ALT 27 02/07/2019   PROT 7.2 02/07/2019   ALBUMIN 4.4 02/07/2019   CALCIUM 10.3 02/07/2019   GFRAA 81 02/07/2019    Speciality Comments: No specialty comments available.  Procedures:  No procedures performed Allergies: Codeine and Levaquin [levofloxacin]   Assessment / Plan:     Visit Diagnoses: Primary osteoarthritis of both hands-she continues to have some hand discomfort.  She has been using  braces which has been helpful.  Rheumatoid factor positive - RF 23.2 on Oct 28, 2017.  She has no synovitis on examination.  Primary osteoarthritis of left hip-she has good range of motion with no discomfort.  Status post total knee replacement, right-doing well.  Primary osteoarthritis of left knee-she has severe osteoarthritis and valgus deformity.  She wanted cortisone injection.  Her blood pressure was elevated.  Have advised her to monitor her blood pressure closely.  If her blood pressure comes down then we can inject it in the future.  History of gout - allopurinol 200 mg po daily.  Her uric acid was 6.7 recently.  Osteopenia, unspecified location-she is on calcium and vitamin D.  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  No orders of the defined types were placed in this encounter.    Follow-Up Instructions: No follow-ups on file.   Bo Merino, MD  Note - This record has been created using Editor, commissioning.  Chart creation errors have been sought, but may not always  have been located. Such creation errors do not reflect on  the standard of medical care.

## 2019-04-21 ENCOUNTER — Encounter: Payer: Self-pay | Admitting: Nurse Practitioner

## 2019-04-21 ENCOUNTER — Ambulatory Visit (INDEPENDENT_AMBULATORY_CARE_PROVIDER_SITE_OTHER): Payer: Medicare HMO | Admitting: Nurse Practitioner

## 2019-04-21 ENCOUNTER — Other Ambulatory Visit: Payer: Self-pay

## 2019-04-21 VITALS — BP 114/44 | HR 58 | Temp 97.4°F | Resp 16 | Ht 63.0 in | Wt 210.0 lb

## 2019-04-21 DIAGNOSIS — I6523 Occlusion and stenosis of bilateral carotid arteries: Secondary | ICD-10-CM | POA: Diagnosis not present

## 2019-04-21 DIAGNOSIS — J449 Chronic obstructive pulmonary disease, unspecified: Secondary | ICD-10-CM | POA: Diagnosis not present

## 2019-04-21 DIAGNOSIS — E876 Hypokalemia: Secondary | ICD-10-CM | POA: Diagnosis not present

## 2019-04-21 DIAGNOSIS — M153 Secondary multiple arthritis: Secondary | ICD-10-CM | POA: Diagnosis not present

## 2019-04-21 DIAGNOSIS — Z79899 Other long term (current) drug therapy: Secondary | ICD-10-CM | POA: Diagnosis not present

## 2019-04-21 DIAGNOSIS — I5189 Other ill-defined heart diseases: Secondary | ICD-10-CM | POA: Diagnosis not present

## 2019-04-21 DIAGNOSIS — I10 Essential (primary) hypertension: Secondary | ICD-10-CM

## 2019-04-21 DIAGNOSIS — M064 Inflammatory polyarthropathy: Secondary | ICD-10-CM

## 2019-04-21 LAB — POCT URINE DRUG SCREEN
POC Amphetamine UR: NOT DETECTED
POC BENZODIAZEPINES UR: NOT DETECTED
POC Barbiturate UR: NOT DETECTED
POC Cocaine UR: NOT DETECTED
POC Ecstasy UR: NOT DETECTED
POC Marijuana UR: NOT DETECTED
POC Methadone UR: NOT DETECTED
POC Methamphetamine UR: NOT DETECTED
POC Opiate Ur: NOT DETECTED
POC Oxycodone UR: POSITIVE — AB
POC PHENCYCLIDINE UR: NOT DETECTED
POC TRICYCLICS UR: NOT DETECTED

## 2019-04-21 MED ORDER — OXYCODONE HCL 5 MG PO TABS: 5.0000 mg | ORAL_TABLET | ORAL | 0 refills | Status: DC | PRN

## 2019-04-21 MED ORDER — TIZANIDINE HCL 4 MG PO TABS: 4.0000 mg | ORAL_TABLET | Freq: Two times a day (BID) | ORAL | 1 refills | Status: DC

## 2019-04-21 MED ORDER — POTASSIUM CHLORIDE ER 10 MEQ PO TBCR: 10.0000 meq | EXTENDED_RELEASE_TABLET | Freq: Every day | ORAL | 1 refills | Status: DC

## 2019-04-21 NOTE — Progress Notes (Signed)
[facility] [place], [place] [address]  Internal MEDICINE  Office Visit Note  Patient Name: Martha Peterson  [id number]  [id number]  Date of Service: 05/07/2019  Chief Complaint  Patient presents with  . Follow-up    ultrasound results  . Hypertension  . Hypothyroidism  . Hyperlipidemia  . Eye Problem    went to the eye doctor and they found a piece of plaque in her eye and they suggest that pt get a carotid US    The patient is here for routine follow up. Today, she is c/o increased back pain. Has been taking care of her husband who has fallen twice recently. She has had to help him stand up from the fall. She has taken oxycodone a bit more often recently. Does need a refill for this.  Recently had abdominal ultrasound. Does show mild fatty infiltration of the liver. She has known history of non-alcoholic sclerosis of the liver.  She is due to have surveillance carotid doppler and echocardiogram done. Had recent appointment with her eye doctor. He noticed plaque in the vessels of her eye. Is concerned about stroke and wants to make sure she has updated carotid doppler study.       Current Medication: Outpatient Encounter Medications as of 04/21/2019  Medication Sig Note  . allopurinol (ZYLOPRIM) 100 MG tablet Take 2 tablets (200 mg total) by mouth daily.   Marland Kitchen ascorbic acid (VITAMIN C) 500 MG tablet Take 500 mg by mouth daily.   Marland Kitchen aspirin EC 81 MG tablet Take 81 mg by mouth daily.   Marland Kitchen atorvastatin (LIPITOR) 10 MG tablet Take 1 tablet (10 mg total) by mouth daily at 6 PM.   . cholecalciferol (VITAMIN D) 400 UNITS TABS tablet Take 1,000 Units by mouth.   . cloNIDine (CATAPRES) 0.1 MG tablet Take 1 tablet (0.1 mg total) by mouth 2 (two) times daily.   . diclofenac sodium (VOLTAREN) 1 % GEL Apply 3 grams to 3 large joints, up to 3 times daily as needed.   . furosemide (LASIX) 40 MG tablet Take one tablet by mouth two times daily   . Ginger, Zingiber  officinalis, (GINGER PO) Take by mouth.   Marland Kitchen ibuprofen (ADVIL,MOTRIN) 600 MG tablet Take 1 tablet (600 mg total) by mouth every 8 (eight) hours as needed.   Marland Kitchen levocetirizine (XYZAL) 5 MG tablet TAKE 1 TABLET EVERY DAY  FOR  ALLERGIES   . levothyroxine (SYNTHROID) 137 MCG tablet Take 1 tablet (137 mcg total) by mouth daily before breakfast.   . losartan-hydrochlorothiazide (HYZAAR) 100-25 MG tablet Take 1 tablet by mouth daily.   . magnesium oxide (MAG-OX) 400 MG tablet Take 400 mg by mouth daily.  08/20/2015: Received from: External Pharmacy  . meclizine (ANTIVERT) 12.5 MG tablet Take 12.5 mg by mouth as needed.    . Misc Natural Products (TART CHERRY ADVANCED PO) Take by mouth daily.   Marland Kitchen omega-3 acid ethyl esters (LOVAZA) 1 G capsule Take 1 g by mouth 2 (two) times daily.   Marland Kitchen omeprazole (PRILOSEC) 20 MG capsule Take one capsule by mouth two times daily   . oxyCODONE (OXY IR/ROXICODONE) 5 MG immediate release tablet Take 1 tablet (5 mg total) by mouth every 4 (four) hours as needed for severe pain.   . potassium chloride (KLOR-CON) 10 MEQ tablet Take 1 tablet (10 mEq total) by mouth daily.   . predniSONE (DELTASONE) 5 MG tablet Take 2 tablets (10 mg total) by mouth daily  with breakfast. (Patient taking differently: Take 10 mg by mouth as needed. )   . tiZANidine (ZANAFLEX) 4 MG tablet Take 1 tablet (4 mg total) by mouth 2 (two) times daily.   . traMADol (ULTRAM) 50 MG tablet Take 1 tablet (50 mg total) by mouth every 8 (eight) hours as needed.   . TURMERIC PO Take by mouth daily.   . vitamin B-12 (CYANOCOBALAMIN) 100 MCG tablet    . vitamin E 400 UNIT capsule Take 400 Units by mouth 2 (two) times daily.  08/20/2015: Received from: External Pharmacy  . Zoster Vaccine Adjuvanted [facility]) injection Shingles vaccine series - inject IM as directed . (Patient not taking: Reported on 05/04/2019)   . [DISCONTINUED] carvedilol (COREG) 25 MG tablet Take 0.5 tablets (12.5 mg total) by mouth 2 (two) times daily  with a meal.   . [DISCONTINUED] oxyCODONE (OXY IR/ROXICODONE) 5 MG immediate release tablet Take 1 tablet (5 mg total) by mouth every 4 (four) hours as needed for severe pain.   . [DISCONTINUED] potassium chloride (K-DUR) 10 MEQ tablet Take 1 tablet (10 mEq total) by mouth daily.   . [DISCONTINUED] tiZANidine (ZANAFLEX) 4 MG tablet Take 1 tablet (4 mg total) by mouth 2 (two) times daily.   . [DISCONTINUED] tiZANidine (ZANAFLEX) 4 MG tablet Take 1 tablet (4 mg total) by mouth 2 (two) times daily.    No facility-administered encounter medications on file as of 04/21/2019.     Surgical History: Past Surgical History:  Procedure Laterality Date  . ABDOMINAL HYSTERECTOMY    . ANKLE SURGERY Left   . APPENDECTOMY    . CHOLECYSTECTOMY    . COLONOSCOPY WITH PROPOFOL N/A 12/04/2014   Procedure: COLONOSCOPY WITH PROPOFOL;  Surgeon: Lollie Sails, MD;  Location: [facility] ENDOSCOPY;  Service: Endoscopy;  Laterality: N/A;  . FRACTURE SURGERY     Femur Fx; LT Ankle Pinning  . JOINT REPLACEMENT     RT TKR  . Patial Thyroid Resection      Medical History: Past Medical History:  Diagnosis Date  . Arthritis   . COPD (chronic obstructive pulmonary disease) ([place])   . Diverticulitis   . Gout   . Hyperlipidemia   . Hypertension   . Hypothyroidism   . 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 Microhematuria   . Mild mitral regurgitation   . Mild tricuspid regurgitation   . Osteopenia   . Pulmonary fibrosis ([place])   . Urinary incontinence     Family History: Family History  Problem Relation Age of Onset  . Heart Problems Mother   . Heart Problems Father   . Diabetes Father   . Lupus Sister   . Thyroid disease Sister   . Heart Problems Sister   . Hypertension Son   . Diabetes Son        borderline   . Arthritis Daughter        in the left knee   . Diabetes Daughter   . Neuropathy Daughter   . Depression Daughter   . Anxiety disorder Daughter     Social  History   Socioeconomic History  . Marital status: Married    Spouse name: Not on file  . Number of children: Not on file  . Years of education: Not on file  . Highest education level: Not on file  Occupational History  . Not on file  Social Needs  . Financial resource strain: Not on file  . Food insecurity    Worry:  Not on file    Inability: Not on file  . Transportation needs    Medical: Not on file    Non-medical: Not on file  Tobacco Use  . Smoking status: Former Smoker    Years: 1.00    Types: Cigarettes  . Smokeless tobacco: Never Used  Substance and Sexual Activity  . Alcohol use: No  . Drug use: No  . Sexual activity: Not Currently  Lifestyle  . Physical activity    Days per week: Not on file    Minutes per session: Not on file  . Stress: Not on file  Relationships  . Social Herbalist on phone: Not on file    Gets together: Not on file    Attends religious service: Not on file    Active member of club or organization: Not on file    Attends meetings of clubs or organizations: Not on file    Relationship status: Not on file  . Intimate partner violence    Fear of current or ex partner: Not on file    Emotionally abused: Not on file    Physically abused: Not on file    Forced sexual activity: Not on file  Other Topics Concern  . Not on file  Social History Narrative  . Not on file      Review of Systems  Constitutional: Positive for fatigue. Negative for activity change, chills and unexpected weight change.  HENT: Negative for congestion, postnasal drip, sinus pressure, sinus pain and sore throat.   Respiratory: Negative for cough, shortness of breath and wheezing.   Cardiovascular: Positive for palpitations and leg swelling. Negative for chest pain.  Gastrointestinal: Negative for constipation, diarrhea, nausea and vomiting.  Endocrine: Negative for cold intolerance, heat intolerance, polydipsia and polyuria.  Musculoskeletal: Positive for  arthralgias, back pain, joint swelling and myalgias.       Moderate to severe pain in left hip. Improved since her most recent visit. Walking better, no longer using cane to help with mobility. She is having severe low back pain.   Skin: Negative for color change and rash.  Allergic/Immunologic: Positive for environmental allergies.  Neurological: Positive for weakness and headaches. Negative for dizziness.  Hematological: Negative for adenopathy. Does not bruise/bleed easily.  Psychiatric/Behavioral: Negative for dysphoric mood. The patient is not nervous/anxious.     Today's Vitals   04/21/19 0937  BP: (!) 114/44  Pulse: (!) 58  Resp: 16  Temp: (!) 97.4 F (36.3 C)  SpO2: 95%  Weight: 210 lb (95.3 kg)  Height: 5\' 3"  (1.6 m)   Body mass index is 37.2 kg/m.  Physical Exam Vitals signs and nursing note reviewed.  Constitutional:      General: She is not in acute distress.    Appearance: Normal appearance. She is well-developed. She is not diaphoretic.  HENT:     Head: Normocephalic and atraumatic.     Mouth/Throat:     Pharynx: No oropharyngeal exudate.  Eyes:     Pupils: Pupils are equal, round, and reactive to light.  Neck:     Musculoskeletal: Normal range of motion and neck supple.     Thyroid: No thyromegaly.     Vascular: No carotid bruit or JVD.     Trachea: No tracheal deviation.  Cardiovascular:     Rate and Rhythm: Normal rate and regular rhythm.     Pulses: Normal pulses.     Heart sounds: Murmur present. Systolic murmur present with a grade  of 2/6. No friction rub. No gallop.      Comments: Moderate, non-pitting edema in left lower leg and ankle.  Pulmonary:     Effort: Pulmonary effort is normal. No respiratory distress.     Breath sounds: Normal breath sounds. No wheezing or rales.  Chest:     Chest wall: No tenderness.     Breasts:        Right: Normal. No swelling, bleeding, inverted nipple, mass, nipple discharge, skin change or tenderness.         Left: Normal. No swelling, bleeding, inverted nipple, mass, nipple discharge, skin change or tenderness.  Abdominal:     General: Bowel sounds are normal.     Palpations: Abdomen is soft.     Tenderness: There is abdominal tenderness.     Comments: Improved.   Musculoskeletal: Normal range of motion.     Comments: Moderate left hip tenderness. No bony deformity or abnormality noted. Patient needing to walk with limp favoring the left side.  Having severe low back pain, worse with bending and twisting at the waist. Worse with reaching, pushing, pulling, and lifting with the arms.   Lymphadenopathy:     Cervical: No cervical adenopathy.  Skin:    General: Skin is warm and dry.     Capillary Refill: Capillary refill takes 2 to 3 seconds.  Neurological:     Mental Status: She is alert and oriented to person, place, and time.     Cranial Nerves: No cranial nerve deficit.  Psychiatric:        Behavior: Behavior normal.        Thought Content: Thought content normal.        Judgment: Judgment normal.    Assessment/Plan: 1. Essential hypertension Stable. Continue bp medications as prescribed   2. Diastolic dysfunction Repeat echocardiogram to be scheduled  - ECHOCARDIOGRAM COMPLETE; Future  3. 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 Inflammatory polyarthritis ([place]) Patient may take oxycodone 5mg  daily if needed for moderate to severe pain. Reviewed risks and possible side effects associated with taking opiates, benzodiazepines and other CNS depressants. Combination of these could cause dizziness and drowsiness. Advised patient not to drive or operate machinery when taking these medications, as patient's and other's life can be at risk and will have consequences. Patient verbalized understanding in this matter. Dependence and abuse for these drugs will  be monitored closely. A Controlled substance policy and procedure is on file which allows [facility] to order a urine drug screen test at any visit. Patient understands and agrees with the plan - oxyCODONE (OXY IR/ROXICODONE) 5 MG immediate release tablet; Take 1 tablet (5 mg total) by mouth every 4 (four) hours as needed for severe pain.  Dispense: 30 tablet; Refill: 0  5. Other secondary osteoarthritis of multiple sites May take tizanidine 4mg  up to twice daily if needed for muascle psin/spasms.  - tiZANidine (ZANAFLEX) 4 MG tablet; Take 1 tablet (4 mg total) by mouth 2 (two) times daily.  Dispense: 30 tablet; Refill: 1  6. Hypokalemia - potassium chloride (KLOR-CON) 10 MEQ tablet; Take 1 tablet (10 mEq total) by mouth daily.  Dispense: 90 tablet; Refill: 1  7. Encounter for long-term (current) use of medications - POCT Urine Drug Screen appropriately positive for Oxycodone only.   General Counseling: blessen carvajal understanding of the findings of todays visit and agrees with plan  of treatment. I have discussed any further diagnostic evaluation that may be needed or ordered today. We also reviewed her medications today. she has been encouraged to call the office with any questions or concerns that should arise related to todays visit.  This patient was seen by [name] FNP Collaboration with Dr [name] as a part of collaborative care agreement  Orders Placed This Encounter  Procedures  . US Carotid Duplex Bilateral  . POCT Urine Drug Screen  . ECHOCARDIOGRAM COMPLETE    Meds ordered this encounter  Medications  . oxyCODONE (OXY IR/ROXICODONE) 5 MG immediate release tablet    Sig: Take 1 tablet (5 mg total) by mouth every 4 (four) hours as needed for severe pain.    Dispense:  30 tablet    Refill:  0    Order Specific Question:   Supervising Provider    Answer:   [name] [id number]  . DISCONTD: tiZANidine (ZANAFLEX) 4 MG tablet    Sig: Take 1 tablet (4  mg total) by mouth 2 (two) times daily.    Dispense:  90 tablet    Refill:  1    Order Specific Question:   Supervising Provider    Answer:   [name] [id number]  . potassium chloride (KLOR-CON) 10 MEQ tablet    Sig: Take 1 tablet (10 mEq total) by mouth daily.    Dispense:  90 tablet    Refill:  1    Order Specific Question:   Supervising Provider    Answer:   [name] [id number]  . tiZANidine (ZANAFLEX) 4 MG tablet    Sig: Take 1 tablet (4 mg total) by mouth 2 (two) times daily.    Dispense:  30 tablet    Refill:  1    Order Specific Question:   Supervising Provider    Answer:   [name] [id number]    Time spent: 46 Minutes      Dr [name] Internal medicine

## 2019-05-01 ENCOUNTER — Other Ambulatory Visit: Payer: Self-pay | Admitting: Internal Medicine

## 2019-05-01 MED ORDER — CARVEDILOL 25 MG PO TABS: 12.5000 mg | ORAL_TABLET | Freq: Two times a day (BID) | ORAL | 1 refills | Status: DC

## 2019-05-02 ENCOUNTER — Ambulatory Visit: Payer: Medicare HMO | Admitting: Rheumatology

## 2019-05-04 ENCOUNTER — Encounter: Payer: Self-pay | Admitting: Rheumatology

## 2019-05-04 ENCOUNTER — Ambulatory Visit (INDEPENDENT_AMBULATORY_CARE_PROVIDER_SITE_OTHER): Payer: Medicare HMO | Admitting: Rheumatology

## 2019-05-04 ENCOUNTER — Other Ambulatory Visit: Payer: Self-pay

## 2019-05-04 VITALS — BP 164/67 | HR 63 | Resp 13 | Ht 63.0 in | Wt 207.0 lb

## 2019-05-04 DIAGNOSIS — R768 Other specified abnormal immunological findings in serum: Secondary | ICD-10-CM

## 2019-05-04 DIAGNOSIS — Z8739 Personal history of other diseases of the musculoskeletal system and connective tissue: Secondary | ICD-10-CM

## 2019-05-04 DIAGNOSIS — Z96651 Presence of right artificial knee joint: Secondary | ICD-10-CM | POA: Diagnosis not present

## 2019-05-04 DIAGNOSIS — I517 Cardiomegaly: Secondary | ICD-10-CM | POA: Diagnosis not present

## 2019-05-04 DIAGNOSIS — M1712 Unilateral primary osteoarthritis, left knee: Secondary | ICD-10-CM

## 2019-05-04 DIAGNOSIS — M19042 Primary osteoarthritis, left hand: Secondary | ICD-10-CM

## 2019-05-04 DIAGNOSIS — M1612 Unilateral primary osteoarthritis, left hip: Secondary | ICD-10-CM | POA: Diagnosis not present

## 2019-05-04 DIAGNOSIS — I10 Essential (primary) hypertension: Secondary | ICD-10-CM

## 2019-05-04 DIAGNOSIS — Z8709 Personal history of other diseases of the respiratory system: Secondary | ICD-10-CM

## 2019-05-04 DIAGNOSIS — M19041 Primary osteoarthritis, right hand: Secondary | ICD-10-CM

## 2019-05-04 DIAGNOSIS — Z8639 Personal history of other endocrine, nutritional and metabolic disease: Secondary | ICD-10-CM

## 2019-05-04 DIAGNOSIS — M8580 Other specified disorders of bone density and structure, unspecified site: Secondary | ICD-10-CM

## 2019-05-04 DIAGNOSIS — D472 Monoclonal gammopathy: Secondary | ICD-10-CM | POA: Diagnosis not present

## 2019-05-04 DIAGNOSIS — I6523 Occlusion and stenosis of bilateral carotid arteries: Secondary | ICD-10-CM

## 2019-05-07 DIAGNOSIS — I5189 Other ill-defined heart diseases: Secondary | ICD-10-CM | POA: Insufficient documentation

## 2019-05-07 DIAGNOSIS — M159 Polyosteoarthritis, unspecified: Secondary | ICD-10-CM | POA: Insufficient documentation

## 2019-05-17 ENCOUNTER — Telehealth: Payer: Self-pay

## 2019-05-17 ENCOUNTER — Other Ambulatory Visit: Payer: Self-pay

## 2019-05-17 DIAGNOSIS — M1612 Unilateral primary osteoarthritis, left hip: Secondary | ICD-10-CM

## 2019-05-17 DIAGNOSIS — M064 Inflammatory polyarthropathy: Secondary | ICD-10-CM

## 2019-05-17 MED ORDER — IBUPROFEN 600 MG PO TABS: 600.0000 mg | ORAL_TABLET | Freq: Three times a day (TID) | ORAL | 1 refills | Status: DC | PRN

## 2019-05-17 NOTE — Telephone Encounter (Signed)
Left message Confirmed patient ultrasound 05/19/19. Martha Peterson

## 2019-05-18 ENCOUNTER — Telehealth: Payer: Self-pay

## 2019-05-18 NOTE — Telephone Encounter (Signed)
Confirmed patient ultrasound for 05/19/2019. Martha Peterson

## 2019-05-19 ENCOUNTER — Other Ambulatory Visit: Payer: Self-pay

## 2019-05-19 ENCOUNTER — Ambulatory Visit (INDEPENDENT_AMBULATORY_CARE_PROVIDER_SITE_OTHER): Payer: Medicare HMO

## 2019-05-19 DIAGNOSIS — M064 Inflammatory polyarthropathy: Secondary | ICD-10-CM

## 2019-05-19 DIAGNOSIS — M1612 Unilateral primary osteoarthritis, left hip: Secondary | ICD-10-CM

## 2019-05-19 DIAGNOSIS — I6523 Occlusion and stenosis of bilateral carotid arteries: Secondary | ICD-10-CM

## 2019-05-19 MED ORDER — IBUPROFEN 600 MG PO TABS: 600.0000 mg | ORAL_TABLET | Freq: Three times a day (TID) | ORAL | 1 refills | Status: DC | PRN

## 2019-05-24 ENCOUNTER — Telehealth: Payer: Self-pay

## 2019-05-24 NOTE — Telephone Encounter (Signed)
Confirmed appointment with patient. [id number] [phone]

## 2019-05-25 ENCOUNTER — Other Ambulatory Visit: Payer: Self-pay

## 2019-05-25 DIAGNOSIS — J0100 Acute maxillary sinusitis, unspecified: Secondary | ICD-10-CM

## 2019-05-25 MED ORDER — PREDNISONE 5 MG PO TABS: 10.0000 mg | ORAL_TABLET | Freq: Every day | ORAL | 1 refills | Status: DC

## 2019-05-26 ENCOUNTER — Ambulatory Visit: Payer: Medicare HMO

## 2019-05-26 ENCOUNTER — Encounter (INDEPENDENT_AMBULATORY_CARE_PROVIDER_SITE_OTHER): Payer: Self-pay

## 2019-05-26 ENCOUNTER — Other Ambulatory Visit: Payer: Self-pay

## 2019-05-26 DIAGNOSIS — I5030 Unspecified diastolic (congestive) heart failure: Secondary | ICD-10-CM | POA: Diagnosis not present

## 2019-05-26 DIAGNOSIS — I6523 Occlusion and stenosis of bilateral carotid arteries: Secondary | ICD-10-CM

## 2019-05-26 DIAGNOSIS — I5189 Other ill-defined heart diseases: Secondary | ICD-10-CM

## 2019-05-28 NOTE — Procedures (Signed)
[place], [place] [address]  DATE OF SERVICE: May 19, 2019  CAROTID DOPPLER INTERPRETATION:  Bilateral Carotid Ultrsasound and Color Doppler Examination was performed. The RIGHT CCA shows moderate plaque in the vessel. The LEFT CCA shows mild plaque in the vessel. There was no intimal thickening noted in the RIGHT carotid artery. There was no intimal thickening in the LEFT carotid artery.  The RIGHT CCA shows peak systolic velocity of 20 cm per second.  The right ICA shows a peak systolic velocity of 43 cm/s.  Overall there are blunted tardus parvus waveforms throughout the right CCA and ICA.  There is also reversal of flow on the right ECA.  The LEFT CCA shows peak systolic velocity of 77 cm per second. The end diastolic velocity is 14 cm per second on the LEFT side. The LEFT ICA shows peak systolic velocity of [id number] per second. LEFT sided ICA end diastolic velocity is 65 cm per second. The LEFT ECA shows a peak systolic velocity of 82 cm per second. The ICA/CCA ratio is calculated to be 3.1. This suggests stenosis of the greater than 70%. The Vertebral Artery shows antegrade flow.   Impression:    The RIGHT CAROTID shows blunted tardus parvus waveforms throughout the right CCA and ICA with difficult to estimate stenosis.  There is also flow reversal in the right ECA. The LEFT CAROTID shows stenosis of greater than 70%.  There is mild plaque formation noted on the LEFT and moderate plaque on the RIGHT  side.  CT angiogram is recommended.  Consider a repeat Carotid doppler if clinical situation and symptoms warrant in 6-12 months. Patient should be encouraged to change lifestyles such as smoking cessation, regular exercise and dietary modification. Use of statins in the right clinical setting and ASA is encouraged.  Allyne Gee, MD [facility] Pulmonary Critical Care Medicine

## 2019-05-29 NOTE — Progress Notes (Signed)
Review with patient at visit 06/02/2019. Needs CTA or MRA of the neck.

## 2019-05-29 NOTE — Progress Notes (Signed)
Moderate plaque right and mild plaque left. >70% steonsis  on left with flow reversal on right side. Discuss with patient at [place] 06/02/2019

## 2019-05-30 NOTE — Progress Notes (Signed)
Carotid dopplers// abnormal

## 2019-05-31 ENCOUNTER — Telehealth: Payer: Self-pay

## 2019-05-31 NOTE — Telephone Encounter (Signed)
Confirmed 06-02-19 ov as virtual.

## 2019-06-02 ENCOUNTER — Other Ambulatory Visit: Payer: Self-pay

## 2019-06-02 ENCOUNTER — Encounter: Payer: Self-pay | Admitting: Nurse Practitioner

## 2019-06-02 ENCOUNTER — Ambulatory Visit (INDEPENDENT_AMBULATORY_CARE_PROVIDER_SITE_OTHER): Payer: Medicare HMO | Admitting: Nurse Practitioner

## 2019-06-02 ENCOUNTER — Encounter (INDEPENDENT_AMBULATORY_CARE_PROVIDER_SITE_OTHER): Payer: Self-pay

## 2019-06-02 DIAGNOSIS — I10 Essential (primary) hypertension: Secondary | ICD-10-CM | POA: Diagnosis not present

## 2019-06-02 DIAGNOSIS — I519 Heart disease, unspecified: Secondary | ICD-10-CM

## 2019-06-02 DIAGNOSIS — I5189 Other ill-defined heart diseases: Secondary | ICD-10-CM

## 2019-06-02 DIAGNOSIS — M064 Inflammatory polyarthropathy: Secondary | ICD-10-CM | POA: Diagnosis not present

## 2019-06-02 DIAGNOSIS — I6523 Occlusion and stenosis of bilateral carotid arteries: Secondary | ICD-10-CM

## 2019-06-02 MED ORDER — TRAMADOL HCL 50 MG PO TABS: 50.0000 mg | ORAL_TABLET | Freq: Three times a day (TID) | ORAL | 1 refills | Status: DC | PRN

## 2019-06-02 MED ORDER — OXYCODONE HCL 5 MG PO TABS: 5.0000 mg | ORAL_TABLET | ORAL | 0 refills | Status: DC | PRN

## 2019-06-02 NOTE — Progress Notes (Signed)
[facility] [place], [place] [address]  Internal MEDICINE  Telephone Visit  Patient Name: Martha Peterson  [id number]  [id number]  Date of Service: 06/02/2019  I connected with the patient at 9:54am by telephone and verified the patients identity using two identifiers.   I discussed the limitations, risks, security and privacy concerns of performing an evaluation and management service by telephone and the availability of in person appointments. I also discussed with the patient that there may be a patient responsible charge related to the service.  The patient expressed understanding and agrees to proceed.    Chief Complaint  Patient presents with  . Telephone Screen  . Telephone Assessment  . Hypertension  . Hyperlipidemia  . Follow-up    echo and cartoid    The patient has been contacted via telephone for follow up visit due to concerns for spread of novel coronavirus. The patient presents for follow up visit. She had echocardiogram. She has mildly decreased LVF at [id number] with diastolic dysfunction and trace to mild MR and TR. She does have family history of CAD. She states that a few weeks ago, she had episode where she could feel her heart pounding and pulse racing. She states that this lasted for a few hours then resolved. She has had intermittent palpitations every since. She states that she has never seen a cardiologist.  She also had carotid doppler. This study is showing moderate plaque in both carotid arteries with at least 70% stenosis. This has worsened since last year when she had mild to moderate plaque with 50-69% stenosis bilaterally. She should have CT angiogram of the neck. She has no required this testing in the past       Current Medication: Outpatient Encounter Medications as of 06/02/2019  Medication Sig Note  . allopurinol (ZYLOPRIM) 100 MG tablet Take 2 tablets (200 mg total) by mouth daily.   Marland Kitchen ascorbic acid (VITAMIN C) 500 MG  tablet Take 500 mg by mouth daily.   Marland Kitchen aspirin EC 81 MG tablet Take 81 mg by mouth daily.   Marland Kitchen atorvastatin (LIPITOR) 10 MG tablet Take 1 tablet (10 mg total) by mouth daily at 6 PM.   . carvedilol (COREG) 25 MG tablet Take 0.5 tablets (12.5 mg total) by mouth 2 (two) times daily with a meal.   . cholecalciferol (VITAMIN D) 400 UNITS TABS tablet Take 1,000 Units by mouth.   . cloNIDine (CATAPRES) 0.1 MG tablet Take 1 tablet (0.1 mg total) by mouth 2 (two) times daily.   . diclofenac sodium (VOLTAREN) 1 % GEL Apply 3 grams to 3 large joints, up to 3 times daily as needed.   . furosemide (LASIX) 40 MG tablet Take one tablet by mouth two times daily   . Ginger, Zingiber officinalis, (GINGER PO) Take by mouth.   Marland Kitchen ibuprofen (ADVIL) 600 MG tablet Take 1 tablet (600 mg total) by mouth every 8 (eight) hours as needed.   Marland Kitchen levocetirizine (XYZAL) 5 MG tablet TAKE 1 TABLET EVERY DAY  FOR  ALLERGIES   . levothyroxine (SYNTHROID) 137 MCG tablet Take 1 tablet (137 mcg total) by mouth daily before breakfast.   . losartan-hydrochlorothiazide (HYZAAR) 100-25 MG tablet Take 1 tablet by mouth daily.   . magnesium oxide (MAG-OX) 400 MG tablet Take 400 mg by mouth daily.  08/20/2015: Received from: External Pharmacy  . meclizine (ANTIVERT) 12.5 MG tablet Take 12.5 mg by mouth as needed.    . Misc Natural Products (  TART CHERRY ADVANCED PO) Take by mouth daily.   . Multiple Vitamins-Minerals (PRESERVISION AREDS 2 PO) Take by mouth 2 (two) times daily.   Marland Kitchen omega-3 acid ethyl esters (LOVAZA) 1 G capsule Take 1 g by mouth 2 (two) times daily.   Marland Kitchen omeprazole (PRILOSEC) 20 MG capsule Take one capsule by mouth two times daily   . oxyCODONE (OXY IR/ROXICODONE) 5 MG immediate release tablet Take 1 tablet (5 mg total) by mouth every 4 (four) hours as needed for severe pain.   . potassium chloride (KLOR-CON) 10 MEQ tablet Take 1 tablet (10 mEq total) by mouth daily.   . predniSONE (DELTASONE) 5 MG tablet Take 2 tablets (10 mg  total) by mouth daily with breakfast.   . tiZANidine (ZANAFLEX) 4 MG tablet Take 1 tablet (4 mg total) by mouth 2 (two) times daily.   . traMADol (ULTRAM) 50 MG tablet Take 1 tablet (50 mg total) by mouth every 8 (eight) hours as needed.   . TURMERIC PO Take by mouth daily.   . vitamin B-12 (CYANOCOBALAMIN) 100 MCG tablet    . vitamin E 400 UNIT capsule Take 400 Units by mouth 2 (two) times daily.  08/20/2015: Received from: External Pharmacy  . Zoster Vaccine Adjuvanted [facility]) injection Shingles vaccine series - inject IM as directed .   . [DISCONTINUED] oxyCODONE (OXY IR/ROXICODONE) 5 MG immediate release tablet Take 1 tablet (5 mg total) by mouth every 4 (four) hours as needed for severe pain.   . [DISCONTINUED] traMADol (ULTRAM) 50 MG tablet Take 1 tablet (50 mg total) by mouth every 8 (eight) hours as needed.    No facility-administered encounter medications on file as of 06/02/2019.    Surgical History: Past Surgical History:  Procedure Laterality Date  . ABDOMINAL HYSTERECTOMY    . ANKLE SURGERY Left   . APPENDECTOMY    . CHOLECYSTECTOMY    . COLONOSCOPY WITH PROPOFOL N/A 12/04/2014   Procedure: COLONOSCOPY WITH PROPOFOL;  Surgeon: Lollie Sails, MD;  Location: [facility] ENDOSCOPY;  Service: Endoscopy;  Laterality: N/A;  . FRACTURE SURGERY     Femur Fx; LT Ankle Pinning  . JOINT REPLACEMENT     RT TKR  . Patial Thyroid Resection      Medical History: Past Medical History:  Diagnosis Date  . Arthritis   . COPD (chronic obstructive pulmonary disease) ([place])   . Diverticulitis   . Gout   . Hyperlipidemia   . Hypertension   . Hypothyroidism   . 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 Microhematuria   . Mild mitral regurgitation   . Mild tricuspid regurgitation   . Osteopenia   . Pulmonary fibrosis ([place])   . Urinary incontinence     Family History: Family History  Problem Relation Age of Onset  . Heart Problems Mother   . Heart Problems  Father   . Diabetes Father   . Lupus Sister   . Thyroid disease Sister   . Heart Problems Sister   . Hypertension Son   . Diabetes Son        borderline   . Arthritis Daughter        in the left knee   . Diabetes Daughter   . Neuropathy Daughter   . Depression Daughter   . Anxiety disorder Daughter     Social History   Socioeconomic History  . Marital status: Married    Spouse name: Not on file  . Number of children: Not on file  .  Years of education: Not on file  . Highest education level: Not on file  Occupational History  . Not on file  Tobacco Use  . Smoking status: Former Smoker    Years: 1.00    Types: Cigarettes  . Smokeless tobacco: Never Used  Substance and Sexual Activity  . Alcohol use: No  . Drug use: No  . Sexual activity: Not Currently  Other Topics Concern  . Not on file  Social History Narrative  . Not on file   Social Determinants of Health   Financial Resource Strain:   . Difficulty of Paying Living Expenses: Not on file  Food Insecurity:   . Worried About Charity fundraiser in the Last Year: Not on file  . Ran Out of Food in the Last Year: Not on file  Transportation Needs:   . Lack of Transportation (Medical): Not on file  . Lack of Transportation (Non-Medical): Not on file  Physical Activity:   . Days of Exercise per Week: Not on file  . Minutes of Exercise per Session: Not on file  Stress:   . Feeling of Stress : Not on file  Social Connections:   . Frequency of Communication with Friends and Family: Not on file  . Frequency of Social Gatherings with Friends and Family: Not on file  . Attends Religious Services: Not on file  . Active Member of Clubs or Organizations: Not on file  . Attends Archivist Meetings: Not on file  . Marital Status: Not on file  Intimate Partner Violence:   . Fear of Current or Ex-Partner: Not on file  . Emotionally Abused: Not on file  . Physically Abused: Not on file  . Sexually Abused: Not  on file      Review of Systems  Constitutional: Positive for fatigue. Negative for activity change, chills and unexpected weight change.  HENT: Negative for congestion, postnasal drip, sinus pressure, sinus pain and sore throat.   Respiratory: Negative for cough, shortness of breath and wheezing.   Cardiovascular: Positive for palpitations and leg swelling. Negative for chest pain.       Episode of rapid heart beat and palpitations.   Gastrointestinal: Negative for constipation, diarrhea, nausea and vomiting.  Endocrine: Negative for cold intolerance, heat intolerance, polydipsia and polyuria.  Musculoskeletal: Positive for arthralgias, back pain, joint swelling and myalgias.       Moderate to severe pain in left hip. Improved since her most recent visit. Walking better, no longer using cane to help with mobility. She is having severe low back pain.   Skin: Negative for color change and rash.  Allergic/Immunologic: Positive for environmental allergies.  Neurological: Positive for weakness and headaches. Negative for dizziness.  Hematological: Negative for adenopathy. Does not bruise/bleed easily.  Psychiatric/Behavioral: Negative for dysphoric mood. The patient is not nervous/anxious.     Vital Signs: There were no vitals taken for this visit.   Observation/Objective:   The patient is alert and oriented. She is pleasant and answers all questions appropriately. Breathing is non-labored. She is in no acute distress at this time.    Assessment/Plan: 1. Left ventricular systolic dysfunction Reviewed echocardiogram with the patient showing decreased LVF at [id number] and diastolic dysfunction. Refer to cardiology for further evaluation.  - Ambulatory referral to Cardiology  2. Diastolic dysfunction Reviewed echocardiogram with the patient showing decreased LVF at [id number] and diastolic dysfunction. Refer to cardiology for further evaluation.  - Ambulatory referral to Cardiology  3. Essential  hypertension Stable.  Continue bp medication as prescribed   4. 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 She should continue atorvastatin and baby aspirin every day. Will get CT angiogram of the neck for further evaluation. Refer as indicated.  - CT ANGIO NECK W OR WO CONTRAST; Future  5. Inflammatory polyarthritis ([place]) *may take tramadol for mild to moderate pain. Take oxycodone 5mg  as needed and as prescribed for severe pain. New prescriptions for both were sent to her pharmacy.  General Counseling: Martha Peterson understanding of the findings of today's phone visit and agrees with plan of treatment. I have discussed any further diagnostic evaluation that may be needed or ordered today. We also reviewed her medications today. she has been encouraged to call the office with any questions or concerns that should arise related to todays visit.  Reviewed risks and possible side effects associated with taking opiates, benzodiazepines and other CNS depressants. Combination of these could cause dizziness and drowsiness. Advised patient not to drive or operate machinery when taking these medications, as patient's and other's life can be at risk and will have consequences. Patient verbalized understanding in this matter. Dependence and abuse for these drugs will be monitored closely. A Controlled substance policy and procedure is on file which allows [facility] to order a urine drug screen test at any visit. Patient understands and agrees with the plan  This patient was seen by [name] FNP Collaboration with Dr [name] as a part of collaborative care agreement  Orders Placed This Encounter  Procedures  . CT ANGIO NECK W OR WO CONTRAST  . Ambulatory referral to Cardiology    Meds ordered this encounter  Medications  . traMADol (ULTRAM) 50 MG  tablet    Sig: Take 1 tablet (50 mg total) by mouth every 8 (eight) hours as needed.    Dispense:  90 tablet    Refill:  1    Order Specific Question:   Supervising Provider    Answer:   [name] [id number]  . oxyCODONE (OXY IR/ROXICODONE) 5 MG immediate release tablet    Sig: Take 1 tablet (5 mg total) by mouth every 4 (four) hours as needed for severe pain.    Dispense:  30 tablet    Refill:  0    Order Specific Question:   Supervising Provider    Answer:   [name] [id number]    Time spent: 83 Minutes    Dr [name] Internal medicine

## 2019-06-05 DIAGNOSIS — M4716 Other spondylosis with myelopathy, lumbar region: Secondary | ICD-10-CM | POA: Diagnosis not present

## 2019-06-07 NOTE — Progress Notes (Signed)
Discussed with patient at visit 06/02/2019. Scheduled for CTA neck 06/14/2019

## 2019-06-13 ENCOUNTER — Other Ambulatory Visit: Payer: Self-pay

## 2019-06-13 MED ORDER — ATORVASTATIN CALCIUM 10 MG PO TABS: 10.0000 mg | ORAL_TABLET | Freq: Every day | ORAL | 0 refills | Status: DC

## 2019-06-14 ENCOUNTER — Ambulatory Visit: Admission: RE | Admit: 2019-06-14 | Payer: Medicare HMO | Source: Ambulatory Visit

## 2019-06-21 ENCOUNTER — Other Ambulatory Visit: Payer: Self-pay

## 2019-06-21 ENCOUNTER — Ambulatory Visit
Admission: RE | Admit: 2019-06-21 | Discharge: 2019-06-21 | Disposition: A | Discharge status: Home / Self Care | Payer: Medicare HMO | Source: Ambulatory Visit | Attending: Nurse Practitioner | Admitting: Nurse Practitioner

## 2019-06-21 DIAGNOSIS — I6523 Occlusion and stenosis of bilateral carotid arteries: Secondary | ICD-10-CM

## 2019-06-21 DIAGNOSIS — M153 Secondary multiple arthritis: Secondary | ICD-10-CM

## 2019-06-21 HISTORY — DX: Unspecified asthma, uncomplicated: J45.909

## 2019-06-21 LAB — POCT I-STAT CREATININE: Creatinine, Ser: 1.1 mg/dL — ABNORMAL HIGH (ref 0.44–1.00)

## 2019-06-21 MED ORDER — IOHEXOL 350 MG/ML SOLN
75.0000 mL | Freq: Once | INTRAVENOUS | Status: AC | PRN
  Administered 2019-06-21: 75 mL via INTRAVENOUS

## 2019-06-21 MED ORDER — TIZANIDINE HCL 4 MG PO TABS: 4.0000 mg | ORAL_TABLET | Freq: Two times a day (BID) | ORAL | 0 refills | Status: DC

## 2019-06-21 NOTE — Progress Notes (Signed)
Hey. Can you schedule this patient an earlier visit. Need to talk about her CTA neck. Thanks.

## 2019-06-23 ENCOUNTER — Ambulatory Visit: Payer: Medicare HMO

## 2019-06-26 ENCOUNTER — Telehealth: Payer: Self-pay

## 2019-06-26 NOTE — Telephone Encounter (Signed)
Tried calling patient regarding ct results and no answer, if pt calls back she needs to be on schedule for results. beth

## 2019-06-28 ENCOUNTER — Ambulatory Visit (INDEPENDENT_AMBULATORY_CARE_PROVIDER_SITE_OTHER): Payer: Medicare HMO | Admitting: Nurse Practitioner

## 2019-06-28 ENCOUNTER — Encounter: Payer: Self-pay | Admitting: Nurse Practitioner

## 2019-06-28 VITALS — BP 176/75 | HR 77 | Ht 63.0 in | Wt 198.0 lb

## 2019-06-28 DIAGNOSIS — I6523 Occlusion and stenosis of bilateral carotid arteries: Secondary | ICD-10-CM | POA: Diagnosis not present

## 2019-06-28 DIAGNOSIS — I5189 Other ill-defined heart diseases: Secondary | ICD-10-CM | POA: Diagnosis not present

## 2019-06-28 DIAGNOSIS — I10 Essential (primary) hypertension: Secondary | ICD-10-CM

## 2019-06-28 NOTE — Progress Notes (Signed)
[facility] [place], [place] [address]  Internal MEDICINE  Telephone Visit  Patient Name: Martha Peterson  [id number]  [id number]  Date of Service: 06/28/2019  I connected with the patient at 9:45am by telephone and verified the patients identity using two identifiers.   I discussed the limitations, risks, security and privacy concerns of performing an evaluation and management service by telephone and the availability of in person appointments. I also discussed with the patient that there may be a patient responsible charge related to the service.  The patient expressed understanding and agrees to proceed.    Chief Complaint  Patient presents with  . Telephone Assessment  . Telephone Screen  . Follow-up    CT scan result     The patient has been contacted via telephone for follow up visit due to concerns for spread of novel coronavirus. The patient presents for follow up visit. We are reviewing the CTA of the neck. 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 There is moderate stenosis of right vertebral artery. There is mild stenosis of left carotid artery with left verterbral artery being widely patent.  Her blood pressure is moderately elevated today. She measured her pressure prior to taking her blood pressure medication this morning. Her blood pressure generally runs on the low side. Last several visits, her systolic pressure has been in 0000000 and diastolic pressure has been between 40 and 50.       Current Medication: Outpatient Encounter Medications as of 06/28/2019  Medication Sig Note  . allopurinol (ZYLOPRIM) 100 MG tablet Take 2 tablets (200 mg total) by mouth daily.   Marland Kitchen ascorbic acid (VITAMIN C) 500 MG tablet Take 500 mg by mouth daily.   Marland Kitchen aspirin EC 81 MG tablet Take 81 mg by mouth daily.   Marland Kitchen atorvastatin (LIPITOR) 10 MG tablet Take 1 tablet (10 mg total)  by mouth daily at 6 PM.   . carvedilol (COREG) 25 MG tablet Take 0.5 tablets (12.5 mg total) by mouth 2 (two) times daily with a meal.   . cholecalciferol (VITAMIN D) 400 UNITS TABS tablet Take 1,000 Units by mouth.   . cloNIDine (CATAPRES) 0.1 MG tablet Take 1 tablet (0.1 mg total) by mouth 2 (two) times daily.   . diclofenac sodium (VOLTAREN) 1 % GEL Apply 3 grams to 3 large joints, up to 3 times daily as needed.   . furosemide (LASIX) 40 MG tablet Take one tablet by mouth two times daily   . Ginger, Zingiber officinalis, (GINGER PO) Take by mouth.   Marland Kitchen ibuprofen (ADVIL) 600 MG tablet Take 1 tablet (600 mg total) by mouth every 8 (eight) hours as needed.   Marland Kitchen levocetirizine (XYZAL) 5 MG tablet TAKE 1 TABLET EVERY DAY  FOR  ALLERGIES   . levothyroxine (SYNTHROID) 137 MCG tablet Take 1 tablet (137 mcg total) by mouth daily before breakfast.   . losartan-hydrochlorothiazide (HYZAAR) 100-25 MG tablet Take 1 tablet by mouth daily.   . magnesium oxide (MAG-OX) 400 MG tablet Take 400 mg by mouth daily.  08/20/2015: Received from: External Pharmacy  . meclizine (ANTIVERT) 12.5 MG tablet Take 12.5 mg by mouth as needed.    . Misc Natural Products (TART CHERRY ADVANCED PO) Take by mouth daily.   . Multiple Vitamins-Minerals (PRESERVISION AREDS 2 PO) Take by mouth 2 (two) times daily.   Marland Kitchen omega-3 acid ethyl esters (LOVAZA) 1 G capsule  Take 1 g by mouth 2 (two) times daily.   Marland Kitchen omeprazole (PRILOSEC) 20 MG capsule Take one capsule by mouth two times daily   . oxyCODONE (OXY IR/ROXICODONE) 5 MG immediate release tablet Take 1 tablet (5 mg total) by mouth every 4 (four) hours as needed for severe pain.   . potassium chloride (KLOR-CON) 10 MEQ tablet Take 1 tablet (10 mEq total) by mouth daily.   . predniSONE (DELTASONE) 5 MG tablet Take 2 tablets (10 mg total) by mouth daily with breakfast.   . tiZANidine (ZANAFLEX) 4 MG tablet Take 1 tablet (4 mg total) by mouth 2 (two) times daily.   . traMADol (ULTRAM) 50 MG  tablet Take 1 tablet (50 mg total) by mouth every 8 (eight) hours as needed.   . TURMERIC PO Take by mouth daily.   . vitamin B-12 (CYANOCOBALAMIN) 100 MCG tablet    . vitamin E 400 UNIT capsule Take 400 Units by mouth 2 (two) times daily.  08/20/2015: Received from: External Pharmacy  . Zoster Vaccine Adjuvanted [facility]) injection Shingles vaccine series - inject IM as directed .    No facility-administered encounter medications on file as of 06/28/2019.    Surgical History: Past Surgical History:  Procedure Laterality Date  . ABDOMINAL HYSTERECTOMY    . ANKLE SURGERY Left   . APPENDECTOMY    . CHOLECYSTECTOMY    . COLONOSCOPY WITH PROPOFOL N/A 12/04/2014   Procedure: COLONOSCOPY WITH PROPOFOL;  Surgeon: Lollie Sails, MD;  Location: [facility] ENDOSCOPY;  Service: Endoscopy;  Laterality: N/A;  . FRACTURE SURGERY     Femur Fx; LT Ankle Pinning  . JOINT REPLACEMENT     RT TKR  . Patial Thyroid Resection      Medical History: Past Medical History:  Diagnosis Date  . Arthritis   . Asthma   . COPD (chronic obstructive pulmonary disease) ([place])   . Diverticulitis   . Gout   . Hyperlipidemia   . Hypertension   . Hypothyroidism   . 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 Microhematuria   . Mild mitral regurgitation   . Mild tricuspid regurgitation   . Osteopenia   . Pulmonary fibrosis ([place])   . Urinary incontinence     Family History: Family History  Problem Relation Age of Onset  . Heart Problems Mother   . Heart Problems Father   . Diabetes Father   . Lupus Sister   . Thyroid disease Sister   . Heart Problems Sister   . Hypertension Son   . Diabetes Son        borderline   . Arthritis Daughter        in the left knee   . Diabetes Daughter   . Neuropathy Daughter   . Depression Daughter   . Anxiety disorder Daughter     Social History   Socioeconomic History  . Marital status: Married    Spouse name: Not on file  . Number of  children: Not on file  . Years of education: Not on file  . Highest education level: Not on file  Occupational History  . Not on file  Tobacco Use  . Smoking status: Former Smoker    Years: 1.00    Types: Cigarettes  . Smokeless tobacco: Never Used  Substance and Sexual Activity  . Alcohol use: No  . Drug use: No  . Sexual activity: Not Currently  Other Topics Concern  . Not on file  Social History Narrative  .  Not on file   Social Determinants of Health   Financial Resource Strain:   . Difficulty of Paying Living Expenses: Not on file  Food Insecurity:   . Worried About Charity fundraiser in the Last Year: Not on file  . Ran Out of Food in the Last Year: Not on file  Transportation Needs:   . Lack of Transportation (Medical): Not on file  . Lack of Transportation (Non-Medical): Not on file  Physical Activity:   . Days of Exercise per Week: Not on file  . Minutes of Exercise per Session: Not on file  Stress:   . Feeling of Stress : Not on file  Social Connections:   . Frequency of Communication with Friends and Family: Not on file  . Frequency of Social Gatherings with Friends and Family: Not on file  . Attends Religious Services: Not on file  . Active Member of Clubs or Organizations: Not on file  . Attends Archivist Meetings: Not on file  . Marital Status: Not on file  Intimate Partner Violence:   . Fear of Current or Ex-Partner: Not on file  . Emotionally Abused: Not on file  . Physically Abused: Not on file  . Sexually Abused: Not on file      Review of Systems  Constitutional: Positive for fatigue. Negative for activity change, chills and unexpected weight change.  HENT: Negative for congestion, postnasal drip, sinus pressure, sinus pain and sore throat.   Respiratory: Negative for cough, shortness of breath and wheezing.   Cardiovascular: Positive for leg swelling. Negative for chest pain and palpitations.  Gastrointestinal: Negative for  constipation, diarrhea, nausea and vomiting.  Endocrine: Negative for cold intolerance, heat intolerance, polydipsia and polyuria.  Musculoskeletal: Positive for arthralgias, back pain, joint swelling and myalgias.       Moderate to severe pain in left hip. Improved since her most recent visit. Walking better, no longer using cane to help with mobility. She is having severe low back pain.   Skin: Negative for color change and rash.  Allergic/Immunologic: Positive for environmental allergies.  Neurological: Positive for weakness and headaches. Negative for dizziness.  Hematological: Negative for adenopathy. Does not bruise/bleed easily.  Psychiatric/Behavioral: Negative for dysphoric mood. The patient is not nervous/anxious.     Today's Vitals   06/28/19 0921  BP: (!) 176/75  Pulse: 77  Weight: 198 lb (89.8 kg)  Height: 5\' 3"  (1.6 m)   Body mass index is 35.07 kg/m.  Observation/Objective:   The patient is alert and oriented. She is pleasant and answers all questions appropriately. Breathing is non-labored. She is in no acute distress at this time.    Assessment/Plan:  1. 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 There is moderate stenosis of right vertebral artery. There is mild stenosis of left carotid artery with left verterbral artery being widely patent.  Refer to vein and vascular for further evaluation.  - Ambulatory referral to Vascular Surgery  2. Essential hypertension Elevated today, however, she had not taken blood pressure medication yet today. Patient will continue to monitor her blood sugars closely. Will call back to report pressure.   3. Diastolic dysfunction Patient scheduled with cardiology.   General Counseling: bonnye sesto understanding of the findings of today's phone visit and agrees with plan of  treatment. I have discussed any further diagnostic evaluation that may be needed or  ordered today. We also reviewed her medications today. she has been encouraged to call the office with any questions or concerns that should arise related to todays visit.    Orders Placed This Encounter  Procedures  . Ambulatory referral to Vascular Surgery   This patient was seen by [facility] with Dr [name] as a part of collaborative care agreement  Time spent: 25 Minutes  Time spent with patient included reviewing progress notes, labs, imaging studies, and discussing plan for follow up.    Dr [name] Internal medicine

## 2019-06-29 DIAGNOSIS — H34211 Partial retinal artery occlusion, right eye: Secondary | ICD-10-CM | POA: Diagnosis not present

## 2019-06-30 ENCOUNTER — Encounter: Payer: Self-pay | Admitting: Cardiology

## 2019-06-30 ENCOUNTER — Ambulatory Visit (INDEPENDENT_AMBULATORY_CARE_PROVIDER_SITE_OTHER): Payer: Medicare HMO | Admitting: Cardiology

## 2019-06-30 ENCOUNTER — Other Ambulatory Visit: Payer: Self-pay

## 2019-06-30 VITALS — BP 130/70 | HR 77 | Ht 63.0 in | Wt 204.0 lb

## 2019-06-30 DIAGNOSIS — I10 Essential (primary) hypertension: Secondary | ICD-10-CM | POA: Diagnosis not present

## 2019-06-30 DIAGNOSIS — I5020 Unspecified systolic (congestive) heart failure: Secondary | ICD-10-CM | POA: Diagnosis not present

## 2019-06-30 DIAGNOSIS — I6523 Occlusion and stenosis of bilateral carotid arteries: Secondary | ICD-10-CM | POA: Diagnosis not present

## 2019-06-30 NOTE — Progress Notes (Signed)
Cardiology Office Note:    Date:  06/30/2019   ID:  Martha Peterson, DOB May 13, 1941, MRN [id number]  PCP:  [name], MD  Cardiologist:  No primary care provider on file.  Electrophysiologist:  None   Referring MD: Ronnell Freshwater, NP   Chief Complaint  Patient presents with  . New Patient (Initial Visit)    Ref by [name], NP for abnormal Echo. Meds reviewed by the pt. Pt. c/o LE edema.     History of Present Illness:    Martha Peterson is a 79 y.o. female with a hx of hypertension, hyperlipidemia, carotid stenosis who presents due to diastolic dysfunction on echocardiogram.  Patient states having history of mitral regurgitation being followed periodically with echocardiogram.  Last outside echocardiogram obtained on 05/26/2019, showed mildly reduced EF of 45%, impaired relaxation/grade 1 diastolic dysfunction, moderate concentric LVH, trivial MR, TR, aortic sclerosis no stenosis.  She denies any symptoms of chest pain or shortness of breath at rest or with activity.  She takes Coreg, losartan, HCTZ.  Has a known history of carotid stenosis with carotid bruit.CT angio of the neck showed high-grade calcific stenosis innominate artery, flow-limiting stenosis in the right carotid system.  There is sclerosis with calcification in the carotid bifurcation bilaterally without significant stenosis.  She is scheduled to see vascular surgery next week.  She takes a baby aspirin and Lipitor 10 mg.  Has a history of liver abnormality hence the low-dose statin.   Past Medical History:  Diagnosis Date  . Arthritis   . Asthma   . COPD (chronic obstructive pulmonary disease) ([place])   . Diverticulitis   . Gout   . Hyperlipidemia   . Hypertension   . Hypothyroidism   . 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 Microhematuria   . Mild mitral regurgitation   . Mild tricuspid regurgitation   . Osteopenia   . Pulmonary fibrosis ([place])   . Urinary incontinence      Past Surgical History:  Procedure Laterality Date  . ABDOMINAL HYSTERECTOMY    . ANKLE SURGERY Left   . APPENDECTOMY    . CHOLECYSTECTOMY    . COLONOSCOPY WITH PROPOFOL N/A 12/04/2014   Procedure: COLONOSCOPY WITH PROPOFOL;  Surgeon: Lollie Sails, MD;  Location: [facility] ENDOSCOPY;  Service: Endoscopy;  Laterality: N/A;  . FRACTURE SURGERY     Femur Fx; LT Ankle Pinning  . JOINT REPLACEMENT     RT TKR  . Patial Thyroid Resection      Current Medications: Current Meds  Medication Sig  . allopurinol (ZYLOPRIM) 100 MG tablet Take 2 tablets (200 mg total) by mouth daily.  Marland Kitchen ascorbic acid (VITAMIN C) 500 MG tablet Take 500 mg by mouth daily.  Marland Kitchen aspirin EC 81 MG tablet Take 81 mg by mouth daily.  Marland Kitchen atorvastatin (LIPITOR) 10 MG tablet Take 1 tablet (10 mg total) by mouth daily at 6 PM.  . carvedilol (COREG) 25 MG tablet Take 0.5 tablets (12.5 mg total) by mouth 2 (two) times daily with a meal.  . cholecalciferol (VITAMIN D) 400 UNITS TABS tablet Take 1,000 Units by mouth.  . cloNIDine (CATAPRES) 0.1 MG tablet Take 1 tablet (0.1 mg total) by mouth 2 (two) times daily.  . diclofenac sodium (VOLTAREN) 1 % GEL Apply 3 grams to 3 large joints, up to 3 times daily as needed.  . furosemide (LASIX) 40 MG tablet Take one tablet by mouth two times daily (Patient taking differently: Take  40 mg by mouth daily. )  . Ginger, Zingiber officinalis, (GINGER PO) Take by mouth.  Marland Kitchen ibuprofen (ADVIL) 600 MG tablet Take 1 tablet (600 mg total) by mouth every 8 (eight) hours as needed.  Marland Kitchen levocetirizine (XYZAL) 5 MG tablet TAKE 1 TABLET EVERY DAY  FOR  ALLERGIES  . levothyroxine (SYNTHROID) 137 MCG tablet Take 1 tablet (137 mcg total) by mouth daily before breakfast.  . losartan-hydrochlorothiazide (HYZAAR) 100-25 MG tablet Take 1 tablet by mouth daily.  . magnesium oxide (MAG-OX) 400 MG tablet Take 400 mg by mouth daily.   . meclizine (ANTIVERT) 12.5 MG tablet Take 12.5 mg by mouth as needed.   . Misc  Natural Products (TART CHERRY ADVANCED PO) Take by mouth daily.  . Multiple Vitamins-Minerals (PRESERVISION AREDS 2 PO) Take by mouth 2 (two) times daily.  Marland Kitchen omega-3 acid ethyl esters (LOVAZA) 1 G capsule Take 1 g by mouth 2 (two) times daily.  Marland Kitchen omeprazole (PRILOSEC) 20 MG capsule Take one capsule by mouth two times daily  . oxyCODONE (OXY IR/ROXICODONE) 5 MG immediate release tablet Take 1 tablet (5 mg total) by mouth every 4 (four) hours as needed for severe pain.  . potassium chloride (KLOR-CON) 10 MEQ tablet Take 1 tablet (10 mEq total) by mouth daily.  . predniSONE (DELTASONE) 5 MG tablet Take 2 tablets (10 mg total) by mouth daily with breakfast.  . tiZANidine (ZANAFLEX) 4 MG tablet Take 1 tablet (4 mg total) by mouth 2 (two) times daily.  . traMADol (ULTRAM) 50 MG tablet Take 1 tablet (50 mg total) by mouth every 8 (eight) hours as needed.  . TURMERIC PO Take by mouth daily.  . vitamin B-12 (CYANOCOBALAMIN) 100 MCG tablet   . vitamin E 400 UNIT capsule Take 400 Units by mouth 2 (two) times daily.   Marland Kitchen Zoster Vaccine Adjuvanted [facility]) injection Shingles vaccine series - inject IM as directed .     Allergies:   Codeine and Levaquin [levofloxacin]   Social History   Socioeconomic History  . Marital status: Married    Spouse name: Not on file  . Number of children: Not on file  . Years of education: Not on file  . Highest education level: Not on file  Occupational History  . Not on file  Tobacco Use  . Smoking status: Former Smoker    Years: 1.00    Types: Cigarettes  . Smokeless tobacco: Never Used  Substance and Sexual Activity  . Alcohol use: No  . Drug use: No  . Sexual activity: Not Currently  Other Topics Concern  . Not on file  Social History Narrative  . Not on file   Social Determinants of Health   Financial Resource Strain:   . Difficulty of Paying Living Expenses: Not on file  Food Insecurity:   . Worried About Charity fundraiser in the Last Year: Not  on file  . Ran Out of Food in the Last Year: Not on file  Transportation Needs:   . Lack of Transportation (Medical): Not on file  . Lack of Transportation (Non-Medical): Not on file  Physical Activity:   . Days of Exercise per Week: Not on file  . Minutes of Exercise per Session: Not on file  Stress:   . Feeling of Stress : Not on file  Social Connections:   . Frequency of Communication with Friends and Family: Not on file  . Frequency of Social Gatherings with Friends and Family: Not on file  .  Attends Religious Services: Not on file  . Active Member of Clubs or Organizations: Not on file  . Attends Archivist Meetings: Not on file  . Marital Status: Not on file     Family History: The patient's family history includes Anxiety disorder in her daughter; Arthritis in her daughter; Depression in her daughter; Diabetes in her daughter, father, and son; Heart Problems in her father, mother, and sister; Hypertension in her son; Lupus in her sister; Neuropathy in her daughter; Thyroid disease in her sister.  ROS:   Please see the history of present illness.     All other systems reviewed and are negative.  EKGs/Labs/Other Studies Reviewed:    The following studies were reviewed today: echocardiogram obtained on 05/26/2019,  showed mildly reduced EF of 45%,  impaired relaxation/grade 1 diastolic dysfunction,  moderate concentric LVH, trivial MR, TR, aortic sclerosis no stenosis  EKG:  EKG is  ordered today.  The ekg ordered today demonstrates normal sinus rhythm, poor R wave progression in anterior leads.  Recent Labs: 02/07/2019: ALT 27; BUN 20; Hemoglobin 12.8; Platelets 144; Potassium 4.4; Sodium 139 04/14/2019: TSH 2.700 06/21/2019: Creatinine, Ser 1.10  Recent Lipid Panel    Component Value Date/Time   CHOL 133 02/07/2019 1121   TRIG 114 02/07/2019 1121   HDL 67 02/07/2019 1121   LDLCALC 43 02/07/2019 1121    Physical Exam:    VS:  BP 130/70 (BP Location:  Right Arm, Patient Position: Sitting, Cuff Size: Normal)   Pulse 77   Ht 5\' 3"  (1.6 m)   Wt 204 lb (92.5 kg)   SpO2 98%   BMI 36.14 kg/m     Wt Readings from Last 3 Encounters:  06/30/19 204 lb (92.5 kg)  06/28/19 198 lb (89.8 kg)  05/04/19 207 lb (93.9 kg)     GEN:  Well nourished, well developed in no acute distress HEENT: Normal NECK: No JVD; bilateral carotid bruits noted LYMPHATICS: No lymphadenopathy CARDIAC: RRR, 2/6 systolic murmur noted, RESPIRATORY:  Clear to auscultation without rales, wheezing or rhonchi  ABDOMEN: Soft, non-tender, non-distended MUSCULOSKELETAL: Trace to 1+ edema; No deformity  SKIN: Warm and dry NEUROLOGIC:  Alert and oriented x 3 PSYCHIATRIC:  Normal affect   ASSESSMENT:    1. Heart failure with reduced ejection fraction ([place])   2. Essential hypertension   3. Bilateral carotid artery stenosis    PLAN:    In order of problems listed above:  1. Mildly reduced ejection fraction with EF 45% on outside echocardiogram.  Grade 1 diastolic dysfunction.  Patient is without symptoms and on optimal cardiac therapy.  Continue Coreg 12.5 twice daily, losartan 100 daily.  Takes Lasix as needed.  We will get pharmacologic stress test to evaluate presence of CAD. 2. Blood pressure well controlled.  Continue current BP meds. 3. On aspirin and Lipitor 10 mg daily.  Recommend moderate intensity statin due to carotid stenosis.  Follow-up in 2 months after stress test  This note was generated in part or whole with voice recognition software. Voice recognition is usually quite accurate but there are transcription errors that can and very often do occur. I apologize for any typographical errors that were not detected and corrected.  Medication Adjustments/Labs and Tests Ordered: Current medicines are reviewed at length with the patient today.  Concerns regarding medicines are outlined above.  Orders Placed This Encounter  Procedures  . NM Myocar Multi W/Spect  W/Wall Motion / EF  . EKG 12-Lead   No  orders of the defined types were placed in this encounter.   Patient Instructions  Medication Instructions:  - Your physician recommends that you continue on your current medications as directed. Please refer to the Current Medication list given to you today.  *If you need a refill on your cardiac medications before your next appointment, please call your pharmacy*  Lab Work: - none ordered  If you have labs (blood work) drawn today and your tests are completely normal, you will receive your results only by: Marland Kitchen MyChart Message (if you have MyChart) OR . A paper copy in the mail If you have any lab test that is abnormal or we need to change your treatment, we will call you to review the results.  Testing/Procedures: - Your physician has requested that you have a lexiscan myoview.   [place]  Your caregiver has ordered a Stress Test with nuclear imaging. The purpose of this test is to evaluate the blood supply to your heart muscle. This procedure is referred to as a "Non-Invasive Stress Test." This is because other than having an IV started in your vein, nothing is inserted or "invades" your body. Cardiac stress tests are done to find areas of poor blood flow to the heart by determining the extent of coronary artery disease (CAD). Some patients exercise on a treadmill, which naturally increases the blood flow to your heart, while others who are  unable to walk on a treadmill due to physical limitations have a pharmacologic/chemical stress agent called Lexiscan . This medicine will mimic walking on a treadmill by temporarily increasing your coronary blood flow.   Please note: these test may take anywhere between 2-4 hours to complete  PLEASE REPORT TO [place] AT THE FIRST DESK WILL DIRECT YOU WHERE TO GO  Date of Procedure:_____________________________________  Arrival Time for  Procedure:______________________________  Instructions regarding medication:   _x___ : Hold lasix (furosemide) the morning of your procedure  __x__:  Hold coreg (carvedilol) night before procedure and morning of procedure  __x__: You may take all of your other medications not listed above with enough water to get them down safely the morning of your procedure   PLEASE NOTIFY THE OFFICE AT LEAST 24 HOURS IN ADVANCE IF YOU ARE UNABLE TO [place].  [phone] AND  PLEASE NOTIFY NUCLEAR MEDICINE AT [facility] AT LEAST 24 HOURS IN ADVANCE IF YOU ARE UNABLE TO KEEP YOUR APPOINTMENT. [phone]  How to prepare for your Myoview test:  1. Do not eat or drink after midnight 2. No caffeine for 24 hours prior to test 3. No smoking 24 hours prior to test. 4. Your medication may be taken with water.  If your doctor stopped a medication because of this test, do not take that medication. 5. Ladies, please do not wear dresses.  Skirts or pants are appropriate. Please wear a short sleeve shirt. 6. No perfume, cologne or lotion. 7. Wear comfortable walking shoes. No heels!   Follow-Up: At [facility], you and your health needs are our priority.  As part of our continuing mission to provide you with exceptional heart care, we have created designated Provider Care Teams.  These Care Teams include your primary Cardiologist (physician) and Advanced Practice Providers (APPs -  Physician Assistants and Nurse Practitioners) who all work together to provide you with the care you need, when you need it.  Your next appointment:   2 month(s)  The format for your next appointment:  In Person  Provider:   Kate Sable, MD  Other Instructions n/a    Cardiac Nuclear Scan A cardiac nuclear scan is a test that measures blood flow to the heart when a person is resting and when he or she is exercising. The test looks for problems such as:  Not enough blood reaching a portion of the  heart.  The heart muscle not working normally. You may need this test if:  You have heart disease.  You have had abnormal lab results.  You have had heart surgery or a balloon procedure to open up blocked arteries (angioplasty).  You have chest pain.  You have shortness of breath. In this test, a radioactive dye (tracer) is injected into your bloodstream. After the tracer has traveled to your heart, an imaging device is used to measure how much of the tracer is absorbed by or distributed to various areas of your heart. This procedure is usually done at a hospital and takes 2-4 hours. Tell a health care provider about:  Any allergies you have.  All medicines you are taking, including vitamins, herbs, eye drops, creams, and over-the-counter medicines.  Any problems you or family members have had with anesthetic medicines.  Any blood disorders you have.  Any surgeries you have had.  Any medical conditions you have.  Whether you are pregnant or may be pregnant. What are the risks? Generally, this is a safe procedure. However, problems may occur, including:  Serious chest pain and heart attack. This is only a risk if the stress portion of the test is done.  Rapid heartbeat.  Sensation of warmth in your chest. This usually passes quickly.  Allergic reaction to the tracer. What happens before the procedure?  Ask your health care provider about changing or stopping your regular medicines. This is especially important if you are taking diabetes medicines or blood thinners.  Follow instructions from your health care provider about eating or drinking restrictions.  Remove your jewelry on the day of the procedure. What happens during the procedure?  An IV will be inserted into one of your veins.  Your health care provider will inject a small amount of radioactive tracer through the IV.  You will wait for 20-40 minutes while the tracer travels through your bloodstream.  Your  heart activity will be monitored with an electrocardiogram (ECG).  You will lie down on an exam table.  Images of your heart will be taken for about 15-20 minutes.  You may also have a stress test. For this test, one of the following may be done: ? You will exercise on a treadmill or stationary bike. While you exercise, your heart's activity will be monitored with an ECG, and your blood pressure will be checked. ? You will be given medicines that will increase blood flow to parts of your heart. This is done if you are unable to exercise.  When blood flow to your heart has peaked, a tracer will again be injected through the IV.  After 20-40 minutes, you will get back on the exam table and have more images taken of your heart.  Depending on the type of tracer used, scans may need to be repeated 3-4 hours later.  Your IV line will be removed when the procedure is over. The procedure may vary among health care providers and hospitals. What happens after the procedure?  Unless your health care provider tells you otherwise, you may return to your normal schedule, including diet, activities, and medicines.  Unless your health care provider tells you otherwise, you may increase your fluid intake. This will help to flush the contrast dye from your body. Drink enough fluid to keep your urine pale yellow.  Ask your health care provider, or the department that is doing the test: ? When will my results be ready? ? How will I get my results? Summary  A cardiac nuclear scan measures the blood flow to the heart when a person is resting and when he or she is exercising.  Tell your health care provider if you are pregnant.  Before the procedure, ask your health care provider about changing or stopping your regular medicines. This is especially important if you are taking diabetes medicines or blood thinners.  After the procedure, unless your health care provider tells you otherwise, increase your  fluid intake. This will help flush the contrast dye from your body.  After the procedure, unless your health care provider tells you otherwise, you may return to your normal schedule, including diet, activities, and medicines. This information is not intended to replace advice given to you by your health care provider. Make sure you discuss any questions you have with your health care provider. Document Revised: 11/15/2017 Document Reviewed: 11/15/2017 Elsevier Patient Education  2020 Sanders, Kate Sable, MD  06/30/2019 3:43 PM    [place]

## 2019-06-30 NOTE — Patient Instructions (Addendum)
Medication Instructions:  - Your physician recommends that you continue on your current medications as directed. Please refer to the Current Medication list given to you today.  *If you need a refill on your cardiac medications before your next appointment, please call your pharmacy*  Lab Work: - none ordered  If you have labs (blood work) drawn today and your tests are completely normal, you will receive your results only by: [facility] MyChart Message (if you have MyChart) OR . A paper copy in the mail If you have any lab test that is abnormal or we need to change your treatment, we will call you to review the results.  Testing/Procedures: - Your physician has requested that you have a lexiscan myoview.   [place]  Your caregiver has ordered a Stress Test with nuclear imaging. The purpose of this test is to evaluate the blood supply to your heart muscle. This procedure is referred to as a "Non-Invasive Stress Test." This is because other than having an IV started in your vein, nothing is inserted or "invades" your body. Cardiac stress tests are done to find areas of poor blood flow to the heart by determining the extent of coronary artery disease (CAD). Some patients exercise on a treadmill, which naturally increases the blood flow to your heart, while others who are  unable to walk on a treadmill due to physical limitations have a pharmacologic/chemical stress agent called Lexiscan . This medicine will mimic walking on a treadmill by temporarily increasing your coronary blood flow.   Please note: these test may take anywhere between 2-4 hours to complete  PLEASE REPORT TO [facility] AT THE FIRST DESK WILL DIRECT YOU WHERE TO GO  Date of Procedure:_____________________________________  Arrival Time for Procedure:______________________________  Instructions regarding medication:   _x___ : Hold lasix (furosemide) the morning of your procedure  __x__:  Hold  coreg (carvedilol) night before procedure and morning of procedure  __x__: You may take all of your other medications not listed above with enough water to get them down safely the morning of your procedure   PLEASE NOTIFY THE OFFICE AT LEAST 24 HOURS IN ADVANCE IF YOU ARE UNABLE TO [place].  [phone] AND  PLEASE NOTIFY NUCLEAR MEDICINE AT [facility] AT LEAST 24 HOURS IN ADVANCE IF YOU ARE UNABLE TO KEEP YOUR APPOINTMENT. [phone]  How to prepare for your Myoview test:  1. Do not eat or drink after midnight 2. No caffeine for 24 hours prior to test 3. No smoking 24 hours prior to test. 4. Your medication may be taken with water.  If your doctor stopped a medication because of this test, do not take that medication. 5. Ladies, please do not wear dresses.  Skirts or pants are appropriate. Please wear a short sleeve shirt. 6. No perfume, cologne or lotion. 7. Wear comfortable walking shoes. No heels!   Follow-Up: At [facility], you and your health needs are our priority.  As part of our continuing mission to provide you with exceptional heart care, we have created designated Provider Care Teams.  These Care Teams include your primary Cardiologist (physician) and Advanced Practice Providers (APPs -  Physician Assistants and Nurse Practitioners) who all work together to provide you with the care you need, when you need it.  Your next appointment:   2 month(s)  The format for your next appointment:   In Person  Provider:   Kate Sable, MD  Other Instructions n/a  Cardiac Nuclear Scan A cardiac nuclear scan is a test that measures blood flow to the heart when a person is resting and when he or she is exercising. The test looks for problems such as:  Not enough blood reaching a portion of the heart.  The heart muscle not working normally. You may need this test if:  You have heart disease.  You have had abnormal lab results.  You have had heart  surgery or a balloon procedure to open up blocked arteries (angioplasty).  You have chest pain.  You have shortness of breath. In this test, a radioactive dye (tracer) is injected into your bloodstream. After the tracer has traveled to your heart, an imaging device is used to measure how much of the tracer is absorbed by or distributed to various areas of your heart. This procedure is usually done at a hospital and takes 2-4 hours. Tell a health care provider about:  Any allergies you have.  All medicines you are taking, including vitamins, herbs, eye drops, creams, and over-the-counter medicines.  Any problems you or family members have had with anesthetic medicines.  Any blood disorders you have.  Any surgeries you have had.  Any medical conditions you have.  Whether you are pregnant or may be pregnant. What are the risks? Generally, this is a safe procedure. However, problems may occur, including:  Serious chest pain and heart attack. This is only a risk if the stress portion of the test is done.  Rapid heartbeat.  Sensation of warmth in your chest. This usually passes quickly.  Allergic reaction to the tracer. What happens before the procedure?  Ask your health care provider about changing or stopping your regular medicines. This is especially important if you are taking diabetes medicines or blood thinners.  Follow instructions from your health care provider about eating or drinking restrictions.  Remove your jewelry on the day of the procedure. What happens during the procedure?  An IV will be inserted into one of your veins.  Your health care provider will inject a small amount of radioactive tracer through the IV.  You will wait for 20-40 minutes while the tracer travels through your bloodstream.  Your heart activity will be monitored with an electrocardiogram (ECG).  You will lie down on an exam table.  Images of your heart will be taken for about 15-20  minutes.  You may also have a stress test. For this test, one of the following may be done: ? You will exercise on a treadmill or stationary bike. While you exercise, your heart's activity will be monitored with an ECG, and your blood pressure will be checked. ? You will be given medicines that will increase blood flow to parts of your heart. This is done if you are unable to exercise.  When blood flow to your heart has peaked, a tracer will again be injected through the IV.  After 20-40 minutes, you will get back on the exam table and have more images taken of your heart.  Depending on the type of tracer used, scans may need to be repeated 3-4 hours later.  Your IV line will be removed when the procedure is over. The procedure may vary among health care providers and hospitals. What happens after the procedure?  Unless your health care provider tells you otherwise, you may return to your normal schedule, including diet, activities, and medicines.  Unless your health care provider tells you otherwise, you may increase your fluid intake. This will  help to flush the contrast dye from your body. Drink enough fluid to keep your urine pale yellow.  Ask your health care provider, or the department that is doing the test: ? When will my results be ready? ? How will I get my results? Summary  A cardiac nuclear scan measures the blood flow to the heart when a person is resting and when he or she is exercising.  Tell your health care provider if you are pregnant.  Before the procedure, ask your health care provider about changing or stopping your regular medicines. This is especially important if you are taking diabetes medicines or blood thinners.  After the procedure, unless your health care provider tells you otherwise, increase your fluid intake. This will help flush the contrast dye from your body.  After the procedure, unless your health care provider tells you otherwise, you may return  to your normal schedule, including diet, activities, and medicines. This information is not intended to replace advice given to you by your health care provider. Make sure you discuss any questions you have with your health care provider. Document Revised: 11/15/2017 Document Reviewed: 11/15/2017 Elsevier Patient Education  [place].

## 2019-07-04 ENCOUNTER — Ambulatory Visit (INDEPENDENT_AMBULATORY_CARE_PROVIDER_SITE_OTHER): Payer: Medicare HMO | Admitting: Vascular Surgery

## 2019-07-04 ENCOUNTER — Other Ambulatory Visit: Payer: Self-pay

## 2019-07-04 ENCOUNTER — Encounter (INDEPENDENT_AMBULATORY_CARE_PROVIDER_SITE_OTHER): Payer: Self-pay | Admitting: Vascular Surgery

## 2019-07-04 VITALS — BP 104/72 | HR 64 | Resp 12 | Ht 63.0 in | Wt 203.0 lb

## 2019-07-04 DIAGNOSIS — I10 Essential (primary) hypertension: Secondary | ICD-10-CM

## 2019-07-04 DIAGNOSIS — J449 Chronic obstructive pulmonary disease, unspecified: Secondary | ICD-10-CM | POA: Diagnosis not present

## 2019-07-04 DIAGNOSIS — H34211 Partial retinal artery occlusion, right eye: Secondary | ICD-10-CM | POA: Insufficient documentation

## 2019-07-04 DIAGNOSIS — K760 Fatty (change of) liver, not elsewhere classified: Secondary | ICD-10-CM

## 2019-07-04 DIAGNOSIS — I6523 Occlusion and stenosis of bilateral carotid arteries: Secondary | ICD-10-CM

## 2019-07-04 DIAGNOSIS — I771 Stricture of artery: Secondary | ICD-10-CM | POA: Diagnosis not present

## 2019-07-04 DIAGNOSIS — J8410 Pulmonary fibrosis, unspecified: Secondary | ICD-10-CM | POA: Diagnosis not present

## 2019-07-04 MED ORDER — CLOPIDOGREL BISULFATE 75 MG PO TABS: 75.0000 mg | ORAL_TABLET | Freq: Every day | ORAL | 11 refills | Status: DC

## 2019-07-04 NOTE — Assessment & Plan Note (Signed)
She underwent a CT angiogram of the neck which I have independently reviewed.  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  This is either occluded or near completely occluded.  Due to the calcific nature of the lesion and is difficult to discern entirely.  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  The right cervical carotid artery is much smaller than the left due to this inflow lesion.  There is also marked tortuosity in the left carotid artery. This is a critical and difficult situation with high risk of stroke, death, or major morbidity.  We discussed the serious nature of the situation and this is even more so given the fact she has had signs of embolization to her.  This creates a scenario where medical management alone is generally not adequate and has a much higher risk of stroke.  I would like to add Plavix therapy to increase her medical management, but we would generally have this on board before we consider intervention.  We discussed that the risk of open innominate artery bypass or endarterectomy is quite high.  If endovascular option exists, treatment with endovascular therapy would generally carry a lower risk.  We discussed that this may require both a concomitant arm approach as well as a femoral approach.  We discussed the reason and rationale this may be required.  We discussed the difficulty being at the bifurcation of 2 major arteries 1 going to the arm and one going to the head neck and how this could create issues for treatment.  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  If anatomy is found at that time that is not suitable for treatment from a femoral artery alone approach, a diagnostic study will be performed and we will come back at a later date to consider another approach and a potentially combined approach for  therapy.  All questions were answered.  Significant amount of time spent drawing diagrams and explained the situation to the patient and her daughter.  Her daughter is medically savvy and seems to understand the serious nature of the situation.

## 2019-07-04 NOTE — Assessment & Plan Note (Signed)
blood pressure control important in reducing the progression of atherosclerotic disease. On appropriate oral medications.  

## 2019-07-04 NOTE — Patient Instructions (Signed)
Angiogram  An angiogram is a procedure used to examine the blood vessels. In this procedure, contrast dye is injected through a long, thin tube (catheter) into an artery. X-rays are then taken, which show if there is a blockage or problem in a blood vessel. The catheter may be inserted in:  The groin area. This is the most common.  The fold of the arm, near the elbow.  The wrist. Tell a health care provider about:  Any allergies you have, including allergies to medicines, shellfish, contrast dye, or iodine.  All medicines you are taking, including vitamins, herbs, eye drops, creams, and over-the-counter medicines.  Any problems you or family members have had with anesthetic medicines.  Any blood disorders you have.  Any surgeries you have had.  Any medical conditions you have or have had, including any kidney problems or kidney failure.  Whether you are pregnant or may be pregnant.  Whether you are breastfeeding. What are the risks? Generally, this is a safe procedure. However, problems may occur, including:  Infection.  Bleeding and bruising.  Allergic reactions to medicines or dyes, including the contrast dye used.  Damage to nearby structures or organs, including damage to blood vessels and kidney damage from the contrast dye.  Blood clots that can lead to a stroke or heart attack. What happens before the procedure? Staying hydrated Follow instructions from your health care provider about hydration, which may include:  Up to 2 hours before the procedure - you may continue to drink clear liquids, such as water, clear fruit juice, black coffee, and plain tea.  Eating and drinking restrictions Follow instructions from your health care provider about eating and drinking, which may include:  8 hours before the procedure - stop eating heavy meals or foods, such as meat, fried foods, or fatty foods.  6 hours before the procedure - stop eating light meals or foods, such  as toast or cereal.  2 hours before the procedure - stop drinking clear liquids. Medicines Ask your health care provider about:  Changing or stopping your regular medicines. This is especially important if you are taking diabetes medicines or blood thinners.  Taking medicines such as aspirin and ibuprofen. These medicines can thin your blood. Do not take these medicines unless your health care provider tells you to take them.  Taking over-the-counter medicines, vitamins, herbs, and supplements. Surgery safety Ask your health care provider:  How your insertion site will be marked.  What steps will be taken to help prevent infection. These may include: ? Removing hair at the insertion site. ? Washing skin with a germ-killing soap. ? Taking antibiotic medicine. General instructions  Do not use any products that contain nicotine or tobacco for at least 4 weeks before the procedure. These products include cigarettes, e-cigarettes, and chewing tobacco. If you need help quitting, ask your health care provider.  You may have blood samples taken.  Plan to have someone take you home from the hospital or clinic.  If you will be going home right after the procedure, plan to have someone with you for 24 hours. What happens during the procedure?  You will lie on your back on an X-ray table. You may be strapped to the table if it is tilted.  An IV will be inserted into one of your veins.  Electrodes may be placed on your chest to monitor your heart rate during the procedure.  You will be given one or both of the following: ? A medicine to   help you relax (sedative). ? A medicine to numb the area where the catheter will be inserted (local anesthetic).  A small incision will be made for catheter insertion.  The catheter will be inserted into an artery using a guide wire. An X-ray procedure (fluoroscopy) will be used to help guide the catheter to the blood vessel to be examined.  A contrast  dye will then be injected into the catheter, and X-rays will be taken. The contrast will help to show where any narrowing or blockages are located in the blood vessels. You may feel flushed as the contrast dye is injected.  Tell your health care provider if you develop chest pain or trouble breathing.  After the fluoroscopy is complete, the catheter will be removed.  A bandage (dressing) will be placed over the site where the catheter was inserted. Pressure will be applied to help stop any bleeding.  The IV will be removed. The procedure may vary among health care providers and hospitals. What happens after the procedure?  Your blood pressure, heart rate, breathing rate, and blood oxygen level will be monitored until you leave the hospital or clinic.  You will be kept in bed lying flat for 6 hours. If the catheter was inserted through your leg, you will be instructed not to bend or cross your legs.  The insertion area and the pulse in your feet or wrist will be checked frequently.  You will be instructed to drink plenty of fluids. This will help wash the contrast dye out of your body.  You may have more blood tests and X-rays. You may also have a test that records the electrical activity of your heart (electrocardiogram, or ECG).  Do not drive for 24 hours if you were given a sedative during your procedure.  It is up to you to get the results of your procedure. Ask your health care provider, or the department that is doing the procedure, when your results will be ready. Summary  An angiogram is a procedure used to examine the blood vessels.  Before the procedure, follow your health care provider's instructions about eating and drinking restrictions. You may be asked to stop eating and drinking several hours before the procedure.  During the procedure, contrast dye is injected through a thin tube (catheter) into an artery. X-rays are then taken.  After the procedure, you will need to  drink plenty of fluids and lie flat for 6 hour. This information is not intended to replace advice given to you by your health care provider. Make sure you discuss any questions you have with your health care provider. Document Revised: 12/14/2018 Document Reviewed: 12/14/2018 Elsevier Patient Education  2020 [employer]  

## 2019-07-04 NOTE — Assessment & Plan Note (Signed)
Her cardiologist has desired to go up on her statin, but due to her liver issues she has not been able to tolerate this.

## 2019-07-04 NOTE — Assessment & Plan Note (Signed)
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

## 2019-07-04 NOTE — Progress Notes (Signed)
Patient ID: Martha Peterson, female   DOB: August 22, 1940, 79 y.o.   MRN: [id number]  Chief Complaint  Patient presents with  . New Patient (Initial Visit)    Carotid Stenosis    HPI Martha Peterson is a 79 y.o. female.  I am asked to see the patient by H. 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  The patient reports being told by her eye doctor that she had had plaque go to her right eye.  They were concerned about her carotids which had been followed by her primary care physician in the cervical carotid arteries remained in the mild to moderate range by her report.  Has a litany of medical issues as listed below.  She does complain about weakness and tiredness in the right arm.  Her right arm blood pressure is also significantly lower than the left arm blood pressure.  She underwent a CT angiogram of the neck which I have independently reviewed.  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  This is either occluded or near completely occluded.  Due to the calcific nature of the lesion and is difficult to discern entirely.  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  The right cervical carotid artery is much smaller than the left due to this inflow lesion.  There is also marked tortuosity in the left carotid artery.   Past Medical History:  Diagnosis Date  . Arthritis   . Asthma   . COPD (chronic obstructive pulmonary disease) ([place])   . Diverticulitis   . Gout   . Hyperlipidemia   . Hypertension   . Hypothyroidism   . 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 Microhematuria   . Mild mitral regurgitation   . Mild tricuspid regurgitation   . Osteopenia   . Pulmonary fibrosis ([place])   . Urinary incontinence     Past Surgical History:  Procedure Laterality Date  . ABDOMINAL HYSTERECTOMY    . ANKLE SURGERY Left   .  APPENDECTOMY    . CHOLECYSTECTOMY    . COLONOSCOPY WITH PROPOFOL N/A 12/04/2014   Procedure: COLONOSCOPY WITH PROPOFOL;  Surgeon: Lollie Sails, MD;  Location[facility] ENDOSCOPY;  Service: Endoscopy;  Laterality: N/A;  . FRACTURE SURGERY     Femur Fx; LT Ankle Pinning  . JOINT REPLACEMENT     RT TKR  . Patial Thyroid Resection      Family History  Problem Relation Age of Onset  . Heart Problems Mother   . Heart Problems Father   . Diabetes Father   . Lupus Sister   . Thyroid disease Sister   . Heart Problems Sister   . Hypertension Son   . Diabetes Son        borderline   . Arthritis Daughter        in the left knee   . Diabetes Daughter   . Neuropathy Daughter   . Depression Daughter   . Anxiety disorder Daughter      Social History   Tobacco Use  . Smoking status: Former Smoker    Years: 1.00    Types: Cigarettes  . Smokeless tobacco: Never Used  Substance Use Topics  . Alcohol use: No  . Drug use: No     Allergies  Allergen Reactions  . Codeine   . Levaquin [Levofloxacin]  Current Outpatient Medications  Medication Sig Dispense Refill  . allopurinol (ZYLOPRIM) 100 MG tablet Take 2 tablets (200 mg total) by mouth daily. 180 tablet 1  . ascorbic acid (VITAMIN C) 500 MG tablet Take 500 mg by mouth daily.    Marland Kitchen aspirin EC 81 MG tablet Take 81 mg by mouth daily.    Marland Kitchen atorvastatin (LIPITOR) 10 MG tablet Take 1 tablet (10 mg total) by mouth daily at 6 PM. 30 tablet 0  . carvedilol (COREG) 25 MG tablet Take 0.5 tablets (12.5 mg total) by mouth 2 (two) times daily with a meal. 90 tablet 1  . cholecalciferol (VITAMIN D) 400 UNITS TABS tablet Take 1,000 Units by mouth.    . cloNIDine (CATAPRES) 0.1 MG tablet Take 1 tablet (0.1 mg total) by mouth 2 (two) times daily. 180 tablet 1  . diclofenac sodium (VOLTAREN) 1 % GEL Apply 3 grams to 3 large joints, up to 3 times daily as needed. 3 Tube 3  . furosemide (LASIX) 40 MG tablet Take one tablet by mouth two times  daily (Patient taking differently: Take 40 mg by mouth daily. ) 180 tablet 1  . Ginger, Zingiber officinalis, (GINGER PO) Take by mouth.    Marland Kitchen ibuprofen (ADVIL) 600 MG tablet Take 1 tablet (600 mg total) by mouth every 8 (eight) hours as needed. 90 tablet 1  . levocetirizine (XYZAL) 5 MG tablet TAKE 1 TABLET EVERY DAY  FOR  ALLERGIES 90 tablet 1  . levothyroxine (SYNTHROID) 137 MCG tablet Take 1 tablet (137 mcg total) by mouth daily before breakfast. 90 tablet 1  . losartan-hydrochlorothiazide (HYZAAR) 100-25 MG tablet Take 1 tablet by mouth daily. 90 tablet 1  . magnesium oxide (MAG-OX) 400 MG tablet Take 400 mg by mouth daily.     . meclizine (ANTIVERT) 12.5 MG tablet Take 12.5 mg by mouth as needed.     . Misc Natural Products (TART CHERRY ADVANCED PO) Take by mouth daily.    Marland Kitchen omega-3 acid ethyl esters (LOVAZA) 1 G capsule Take 1 g by mouth 2 (two) times daily.    Marland Kitchen omeprazole (PRILOSEC) 20 MG capsule Take one capsule by mouth two times daily 180 capsule 1  . oxyCODONE (OXY IR/ROXICODONE) 5 MG immediate release tablet Take 1 tablet (5 mg total) by mouth every 4 (four) hours as needed for severe pain. 30 tablet 0  . potassium chloride (KLOR-CON) 10 MEQ tablet Take 1 tablet (10 mEq total) by mouth daily. 90 tablet 1  . predniSONE (DELTASONE) 5 MG tablet Take 2 tablets (10 mg total) by mouth daily with breakfast. 180 tablet 1  . tiZANidine (ZANAFLEX) 4 MG tablet Take 1 tablet (4 mg total) by mouth 2 (two) times daily. 90 tablet 0  . traMADol (ULTRAM) 50 MG tablet Take 1 tablet (50 mg total) by mouth every 8 (eight) hours as needed. 90 tablet 1  . TURMERIC PO Take by mouth daily.    . vitamin B-12 (CYANOCOBALAMIN) 100 MCG tablet     . vitamin E 400 UNIT capsule Take 400 Units by mouth 2 (two) times daily.     Marland Kitchen Zoster Vaccine Adjuvanted [facility]) injection Shingles vaccine series - inject IM as directed . 0.5 mL 1  . clopidogrel (PLAVIX) 75 MG tablet Take 1 tablet (75 mg total) by mouth daily. 30  tablet 11  . ketorolac (TORADOL) 30 MG/ML injection ketorolac 30 mg/mL injection syringe  Inject 1 mL by intramuscular route.    . Multiple Vitamins-Minerals (PRESERVISION  AREDS 2 PO) Take by mouth 2 (two) times daily.     No current facility-administered medications for this visit.      REVIEW OF SYSTEMS (Negative unless checked)  Constitutional: [] Weight loss  [] Fever  [] Chills Cardiac: [] Chest pain   [] Chest pressure   [] Palpitations   [] Shortness of breath when laying flat   [] Shortness of breath at rest   [x] Shortness of breath with exertion. Vascular:  [] Pain in legs with walking   [] Pain in legs at rest   [] Pain in legs when laying flat   [] Claudication   [] Pain in feet when walking  [] Pain in feet at rest  [] Pain in feet when laying flat   [] History of DVT   [] Phlebitis   [] Swelling in legs   [] Varicose veins   [] Non-healing ulcers Pulmonary:   [] Uses home oxygen   [] Productive cough   [] Hemoptysis   [] Wheeze  [x] COPD   [x] Asthma Neurologic:  [] Dizziness  [] Blackouts   [] Seizures   [] History of stroke   [] History of TIA  [] Aphasia   [] Temporary blindness   [] Dysphagia   [] Weakness or numbness in arms   [] Weakness or numbness in legs Musculoskeletal:  [x] Arthritis   [] Joint swelling   [] Joint pain   [] Low back pain Hematologic:  [] Easy bruising  [] Easy bleeding   [] Hypercoagulable state   [] Anemic  [] Hepatitis Gastrointestinal:  [] Blood in stool   [] Vomiting blood  [x] Gastroesophageal reflux/heartburn   [] Abdominal pain Genitourinary:  [] Chronic kidney disease   [] Difficult urination  [x] Frequent urination  [] Burning with urination   [] Hematuria Skin:  [] Rashes   [] Ulcers   [] Wounds Psychological:  [] History of anxiety   []  History of major depression.    Physical Exam BP 104/72 (BP Location: Right Arm)   Pulse 64   Resp 12   Ht 5\' 3"  (1.6 m)   Wt 203 lb (92.1 kg)   BMI 35.96 kg/m  Gen:  WD/WN, NAD Head: Esto/AT, No temporalis wasting.  Ear/Nose/Throat: Hearing grossly  intact, nares w/o erythema or drainage, oropharynx w/o Erythema/Exudate Eyes: Conjunctiva clear, sclera non-icteric  Neck: trachea midline.  Left carotid bruit Pulmonary:  Good air movement, clear to auscultation bilaterally.  Cardiac: RRR, no JVD Vascular:  Vessel Right Left  Radial  trace palpable Palpable                                   Gastrointestinal: soft, non-tender/non-distended. Musculoskeletal: M/S 5/5 throughout.  Extremities without ischemic changes.  No deformity or atrophy.  Trace lower extremity edema. Neurologic: Sensation grossly intact in extremities.  Symmetrical.  Speech is fluent. Motor exam as listed above. Psychiatric: Judgment intact, Mood & affect appropriate for pt's clinical situation. Dermatologic: No rashes or ulcers noted.  No cellulitis or open wounds.   Radiology CT ANGIO NECK W OR WO CONTRAST  Result Date: 06/21/2019 CLINICAL DATA:  Carotid stenosis EXAM: CT ANGIOGRAPHY NECK TECHNIQUE: Multidetector CT imaging of the neck was performed using the standard protocol during bolus administration of intravenous contrast. Multiplanar CT image reconstructions and MIPs were obtained to evaluate the vascular anatomy. Carotid stenosis measurements (when applicable) are obtained utilizing NASCET criteria, using the distal internal carotid diameter as the denominator. CONTRAST:  22mL OMNIPAQUE IOHEXOL 350 MG/ML SOLN COMPARISON:  None. FINDINGS: Aortic arch: Atherosclerotic calcification aortic arch. Extensive calcification at the origin of the innominate artery with severe stenosis. There is flow distal to the stenosis albeit with decreased density compared  to the left carotid artery indicating flow limiting stenosis of the proximal innominate artery. Mild stenosis origin of left common carotid artery. Left subclavian artery widely patent. Right carotid system: Decreased density of the right carotid system due to flow limiting stenosis of the innominate artery.  Atherosclerotic calcification right carotid bifurcation without significant internal carotid artery stenosis. Left carotid system: Mild stenosis origin of left common carotid artery. Atherosclerotic calcification left carotid bulb without significant stenosis. Vertebral arteries: Moderate stenosis origin of right vertebral artery which is then patent to the basilar. Left vertebral artery widely patent without stenosis. Skeleton: Cervical spondylosis.  No acute skeletal abnormality. Other neck: Negative for mass or adenopathy in the neck. Upper chest: Lung apices clear bilaterally. IMPRESSION: 1. 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 stenosis with decreased flow in the right carotid system. 2. Atherosclerotic calcification in the carotid bifurcation bilaterally without significant stenosis 3. Moderate stenosis origin of right vertebral artery. Left vertebral artery widely patent. Electronically Signed   By: Franchot Gallo M.D.   On: 06/21/2019 10:01    Labs Recent Results (from the past 2160 hour(s))  T4, free     Status: None   Collection Time: 04/14/19 11:28 AM  Result Value Ref Range   Free T4 1.47 0.82 - 1.77 ng/dL  TSH     Status: None   Collection Time: 04/14/19 11:28 AM  Result Value Ref Range   TSH 2.700 0.450 - 4.500 uIU/mL  Uric acid     Status: None   Collection Time: 04/14/19 11:28 AM  Result Value Ref Range   Uric Acid 6.4 2.5 - 7.1 mg/dL    Comment:            Therapeutic target for gout patients: <6.0  POCT Urine Drug Screen     Status: Abnormal   Collection Time: 04/21/19  9:58 AM  Result Value Ref Range   POC METHAMPHETAMINE UR None Detected None Detected   POC Opiate Ur None Detected None Detected   POC Barbiturate UR None Detected None Detected   POC Amphetamine UR None Detected None Detected   POC Oxycodone UR Positive (A) None Detected   POC Cocaine UR None Detected None Detected   POC Ecstasy UR None Detected None Detected   POC TRICYCLICS UR  None Detected None Detected   POC PHENCYCLIDINE UR None Detected None Detected   POC MARIJUANA UR None Detected None Detected   POC METHADONE UR None Detected None Detected   POC BENZODIAZEPINES UR None Detected None Detected   URINE TEMPERATURE     POC DRUG SCREEN OXIDANTS URINE     POC SPECIFIC GRAVITY URINE     POC PH URINE     Methylenedioxyamphetamine    I-STAT creatinine     Status: Abnormal   Collection Time: 06/21/19  9:32 AM  Result Value Ref Range   Creatinine, Ser 1.10 (H) 0.44 - 1.00 mg/dL    Assessment/Plan:  Essential hypertension blood pressure control important in reducing the progression of atherosclerotic disease. On appropriate oral medications.   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  Nonalcoholic fatty liver disease Her cardiologist has desired to go up on her statin, but due to her liver issues she has not been able to tolerate this.  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  This  is most likely from her innominate artery lesion.  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HCC) She underwent a CT angiogram of the neck which I have independently reviewed.  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  This is either occluded or near completely occluded.  Due to the calcific nature of the lesion and is difficult to discern entirely.  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  The right cervical carotid artery is much smaller than the left due to this inflow lesion.  There is also marked tortuosity in the left carotid artery. This is a critical and difficult situation with high risk of stroke, death, or major morbidity.  We discussed the serious  nature of the situation and this is even more so given the fact she has had signs of embolization to her.  This creates a scenario where medical management alone is generally not adequate and has a much higher risk of stroke.  I would like to add Plavix therapy to increase her medical management, but we would generally have this on board before we consider intervention.  We discussed that the risk of open innominate artery bypass or endarterectomy is quite high.  If endovascular option exists, treatment with endovascular therapy would generally carry a lower risk.  We discussed that this may require both a concomitant arm approach as well as a femoral approach.  We discussed the reason and rationale this may be required.  We discussed the difficulty being at the bifurcation of 2 major arteries 1 going to the arm and one going to the head neck and how this could create issues for treatment.  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  If anatomy is found at that time that is not suitable for treatment from a femoral artery alone approach, a diagnostic study will be performed and we will come back at a later date to consider another approach and a potentially combined approach for therapy.  All questions were answered.  Significant amount of time spent drawing diagrams and explained the situation to the patient and her daughter.  Her daughter is medically savvy and seems to understand the serious nature of the situation.      Leotis Pain 07/04/2019, 12:34 PM   This note was created with Dragon medical transcription system.  Any errors from dictation are unintentional.

## 2019-07-04 NOTE — Assessment & Plan Note (Signed)
Seen by her ophthalmologist.  This is most likely from her innominate artery lesion.

## 2019-07-06 ENCOUNTER — Telehealth (INDEPENDENT_AMBULATORY_CARE_PROVIDER_SITE_OTHER): Payer: Self-pay

## 2019-07-06 NOTE — Telephone Encounter (Signed)
Spoke with the patient and she is now scheduled with Dr. Lucky Cowboy for a Innominate stent placement on 07/17/19 with a 8:15 am arrival time to the [facility]. Patient will do covid testing on 07/13/19 between 12:30-2:30 pm at the [facility]. Pre-procedure instructions were discussed and will be mailed to the patient.

## 2019-07-10 ENCOUNTER — Encounter
Admission: RE | Admit: 2019-07-10 | Discharge: 2019-07-10 | Disposition: A | Discharge status: Home / Self Care | Payer: Medicare HMO | Source: Ambulatory Visit | Attending: Cardiology | Admitting: Cardiology

## 2019-07-10 ENCOUNTER — Other Ambulatory Visit: Payer: Self-pay

## 2019-07-10 DIAGNOSIS — I5020 Unspecified systolic (congestive) heart failure: Secondary | ICD-10-CM | POA: Insufficient documentation

## 2019-07-10 LAB — NM MYOCAR MULTI W/SPECT W/WALL MOTION / EF
LV dias vol: 70 mL (ref 46–106)
LV sys vol: 21 mL
Peak HR: 118 {beats}/min
Percent HR: 84 %
Rest HR: 59 {beats}/min
SDS: 0
SRS: 0
SSS: 0
TID: 0.79

## 2019-07-10 MED ORDER — REGADENOSON 0.4 MG/5ML IV SOLN
0.4000 mg | Freq: Once | INTRAVENOUS | Status: AC
  Administered 2019-07-10: 0.4 mg via INTRAVENOUS
  Filled 2019-07-10: qty 5

## 2019-07-10 MED ORDER — TECHNETIUM TC 99M TETROFOSMIN IV KIT
30.0000 | PACK | Freq: Once | INTRAVENOUS | Status: AC | PRN
  Administered 2019-07-10: 30.313 via INTRAVENOUS

## 2019-07-10 MED ORDER — TECHNETIUM TC 99M TETROFOSMIN IV KIT
10.0000 | PACK | Freq: Once | INTRAVENOUS | Status: AC | PRN
  Administered 2019-07-10: 10.9 via INTRAVENOUS

## 2019-07-11 ENCOUNTER — Other Ambulatory Visit: Payer: Self-pay

## 2019-07-11 MED ORDER — ATORVASTATIN CALCIUM 10 MG PO TABS: 10.0000 mg | ORAL_TABLET | Freq: Every day | ORAL | 1 refills | Status: DC

## 2019-07-13 ENCOUNTER — Other Ambulatory Visit
Admission: RE | Admit: 2019-07-13 | Discharge: 2019-07-13 | Disposition: A | Discharge status: Home / Self Care | Payer: Medicare HMO | Source: Ambulatory Visit | Attending: Vascular Surgery | Admitting: Vascular Surgery

## 2019-07-13 ENCOUNTER — Other Ambulatory Visit: Payer: Self-pay

## 2019-07-13 DIAGNOSIS — Z01812 Encounter for preprocedural laboratory examination: Secondary | ICD-10-CM | POA: Diagnosis not present

## 2019-07-13 DIAGNOSIS — Z20822 Contact with and (suspected) exposure to covid-19: Secondary | ICD-10-CM | POA: Insufficient documentation

## 2019-07-13 LAB — SARS CORONAVIRUS 2 (TAT 6-24 HRS): SARS Coronavirus 2: NEGATIVE

## 2019-07-14 ENCOUNTER — Other Ambulatory Visit: Payer: Self-pay

## 2019-07-14 MED ORDER — LOSARTAN POTASSIUM-HCTZ 100-25 MG PO TABS: 1.0000 | ORAL_TABLET | Freq: Every day | ORAL | 1 refills | Status: DC

## 2019-07-17 ENCOUNTER — Other Ambulatory Visit: Payer: Self-pay

## 2019-07-17 ENCOUNTER — Telehealth (INDEPENDENT_AMBULATORY_CARE_PROVIDER_SITE_OTHER): Payer: Self-pay

## 2019-07-17 ENCOUNTER — Encounter
Admission: RE | Disposition: A | Discharge status: Home / Self Care | Payer: Self-pay | Source: Home / Self Care | Attending: Vascular Surgery

## 2019-07-17 ENCOUNTER — Ambulatory Visit
Admission: RE | Admit: 2019-07-17 | Discharge: 2019-07-17 | Disposition: A | Discharge status: Home / Self Care | Payer: Medicare HMO | Attending: Vascular Surgery | Admitting: Vascular Surgery

## 2019-07-17 ENCOUNTER — Encounter: Payer: Self-pay | Admitting: Vascular Surgery

## 2019-07-17 ENCOUNTER — Other Ambulatory Visit (INDEPENDENT_AMBULATORY_CARE_PROVIDER_SITE_OTHER): Payer: Self-pay | Admitting: Nurse Practitioner

## 2019-07-17 DIAGNOSIS — H34211 Partial retinal artery occlusion, right eye: Secondary | ICD-10-CM | POA: Insufficient documentation

## 2019-07-17 DIAGNOSIS — K760 Fatty (change of) liver, not elsewhere classified: Secondary | ICD-10-CM | POA: Insufficient documentation

## 2019-07-17 DIAGNOSIS — I081 Rheumatic disorders of both mitral and tricuspid valves: Secondary | ICD-10-CM | POA: Insufficient documentation

## 2019-07-17 DIAGNOSIS — I708 Atherosclerosis of other arteries: Secondary | ICD-10-CM | POA: Insufficient documentation

## 2019-07-17 DIAGNOSIS — Z79890 Hormone replacement therapy (postmenopausal): Secondary | ICD-10-CM | POA: Diagnosis not present

## 2019-07-17 DIAGNOSIS — Z885 Allergy status to narcotic agent status: Secondary | ICD-10-CM | POA: Insufficient documentation

## 2019-07-17 DIAGNOSIS — Z87891 Personal history of nicotine dependence: Secondary | ICD-10-CM | POA: Insufficient documentation

## 2019-07-17 DIAGNOSIS — Z96651 Presence of right artificial knee joint: Secondary | ICD-10-CM | POA: Diagnosis not present

## 2019-07-17 DIAGNOSIS — E785 Hyperlipidemia, unspecified: Secondary | ICD-10-CM | POA: Insufficient documentation

## 2019-07-17 DIAGNOSIS — I10 Essential (primary) hypertension: Secondary | ICD-10-CM | POA: Insufficient documentation

## 2019-07-17 DIAGNOSIS — Z7982 Long term (current) use of aspirin: Secondary | ICD-10-CM | POA: Diagnosis not present

## 2019-07-17 DIAGNOSIS — J449 Chronic obstructive pulmonary disease, unspecified: Secondary | ICD-10-CM | POA: Diagnosis not present

## 2019-07-17 DIAGNOSIS — Z8261 Family history of arthritis: Secondary | ICD-10-CM | POA: Insufficient documentation

## 2019-07-17 DIAGNOSIS — M8580 Other specified disorders of bone density and structure, unspecified site: Secondary | ICD-10-CM | POA: Diagnosis not present

## 2019-07-17 DIAGNOSIS — E039 Hypothyroidism, unspecified: Secondary | ICD-10-CM | POA: Insufficient documentation

## 2019-07-17 DIAGNOSIS — Z79899 Other long term (current) drug therapy: Secondary | ICD-10-CM | POA: Diagnosis not present

## 2019-07-17 DIAGNOSIS — Z8249 Family history of ischemic heart disease and other diseases of the circulatory system: Secondary | ICD-10-CM | POA: Diagnosis not present

## 2019-07-17 DIAGNOSIS — I771 Stricture of artery: Secondary | ICD-10-CM

## 2019-07-17 DIAGNOSIS — I6523 Occlusion and stenosis of bilateral carotid arteries: Secondary | ICD-10-CM | POA: Diagnosis not present

## 2019-07-17 DIAGNOSIS — Z8349 Family history of other endocrine, nutritional and metabolic diseases: Secondary | ICD-10-CM | POA: Insufficient documentation

## 2019-07-17 DIAGNOSIS — Z7902 Long term (current) use of antithrombotics/antiplatelets: Secondary | ICD-10-CM | POA: Insufficient documentation

## 2019-07-17 DIAGNOSIS — Z881 Allergy status to other antibiotic agents status: Secondary | ICD-10-CM | POA: Insufficient documentation

## 2019-07-17 DIAGNOSIS — M109 Gout, unspecified: Secondary | ICD-10-CM | POA: Insufficient documentation

## 2019-07-17 DIAGNOSIS — D472 Monoclonal gammopathy: Secondary | ICD-10-CM | POA: Diagnosis not present

## 2019-07-17 DIAGNOSIS — I658 Occlusion and stenosis of other precerebral arteries: Secondary | ICD-10-CM | POA: Diagnosis not present

## 2019-07-17 HISTORY — PX: CAROTID PTA/STENT INTERVENTION: CATH118231

## 2019-07-17 LAB — CREATININE, SERUM
Creatinine, Ser: 0.94 mg/dL (ref 0.44–1.00)
GFR calc Af Amer: >60 mL/min (ref >60)
GFR calc non Af Amer: 57 mL/min — ABNORMAL LOW (ref >60)

## 2019-07-17 LAB — BUN: BUN: 30 mg/dL — ABNORMAL HIGH (ref 8–23)

## 2019-07-17 SURGERY — CAROTID PTA/STENT INTERVENTION
Anesthesia: Moderate Sedation

## 2019-07-17 MED ORDER — OMEPRAZOLE 20 MG PO CPDR: DELAYED_RELEASE_CAPSULE | ORAL | 1 refills | Status: DC

## 2019-07-17 MED ORDER — HYDROMORPHONE HCL 1 MG/ML IJ SOLN: 1.0000 mg | Freq: Once | INTRAMUSCULAR | Status: DC | PRN

## 2019-07-17 MED ORDER — MIDAZOLAM HCL 5 MG/5ML IJ SOLN
INTRAMUSCULAR | Status: AC
  Filled 2019-07-17: qty 5

## 2019-07-17 MED ORDER — HEPARIN SODIUM (PORCINE) 1000 UNIT/ML IJ SOLN
INTRAMUSCULAR | Status: DC | PRN
  Administered 2019-07-17: 5000 [IU] via INTRAVENOUS

## 2019-07-17 MED ORDER — FUROSEMIDE 40 MG PO TABS: ORAL_TABLET | ORAL | 1 refills | Status: DC

## 2019-07-17 MED ORDER — SODIUM CHLORIDE 0.9 % IV SOLN: INTRAVENOUS | Status: DC

## 2019-07-17 MED ORDER — DIPHENHYDRAMINE HCL 50 MG/ML IJ SOLN: 50.0000 mg | Freq: Once | INTRAMUSCULAR | Status: DC | PRN

## 2019-07-17 MED ORDER — CEFAZOLIN SODIUM-DEXTROSE 2-4 GM/100ML-% IV SOLN
INTRAVENOUS | Status: AC
  Administered 2019-07-17: 11:00:00 2 g via INTRAVENOUS
  Filled 2019-07-17: qty 100

## 2019-07-17 MED ORDER — FENTANYL CITRATE (PF) 100 MCG/2ML IJ SOLN
INTRAMUSCULAR | Status: DC | PRN
  Administered 2019-07-17: 25 ug via INTRAVENOUS
  Administered 2019-07-17: 50 ug via INTRAVENOUS
  Administered 2019-07-17: 25 ug via INTRAVENOUS

## 2019-07-17 MED ORDER — FAMOTIDINE 20 MG PO TABS: 40.0000 mg | ORAL_TABLET | Freq: Once | ORAL | Status: DC | PRN

## 2019-07-17 MED ORDER — METHYLPREDNISOLONE SODIUM SUCC 125 MG IJ SOLR: 125.0000 mg | Freq: Once | INTRAMUSCULAR | Status: DC | PRN

## 2019-07-17 MED ORDER — MIDAZOLAM HCL 2 MG/2ML IJ SOLN
INTRAMUSCULAR | Status: DC | PRN
  Administered 2019-07-17: 2 mg via INTRAVENOUS
  Administered 2019-07-17 (×2): 1 mg via INTRAVENOUS

## 2019-07-17 MED ORDER — ONDANSETRON HCL 4 MG/2ML IJ SOLN: 4.0000 mg | Freq: Four times a day (QID) | INTRAMUSCULAR | Status: DC | PRN

## 2019-07-17 MED ORDER — FENTANYL CITRATE (PF) 100 MCG/2ML IJ SOLN
INTRAMUSCULAR | Status: AC
  Filled 2019-07-17: qty 2

## 2019-07-17 MED ORDER — ESMOLOL HCL-SODIUM CHLORIDE 2000 MG/100ML IV SOLN
INTRAVENOUS | Status: AC
  Filled 2019-07-17: qty 100

## 2019-07-17 MED ORDER — MIDAZOLAM HCL 2 MG/ML PO SYRP: 8.0000 mg | ORAL_SOLUTION | Freq: Once | ORAL | Status: DC | PRN

## 2019-07-17 MED ORDER — DOPAMINE-DEXTROSE 3.2-5 MG/ML-% IV SOLN
INTRAVENOUS | Status: AC
  Filled 2019-07-17: qty 250

## 2019-07-17 MED ORDER — HEPARIN SODIUM (PORCINE) 1000 UNIT/ML IJ SOLN
INTRAMUSCULAR | Status: AC
  Filled 2019-07-17: qty 1

## 2019-07-17 MED ORDER — CEFAZOLIN SODIUM-DEXTROSE 2-4 GM/100ML-% IV SOLN: 2.0000 g | Freq: Once | INTRAVENOUS | Status: AC

## 2019-07-17 MED ORDER — IODIXANOL 320 MG/ML IV SOLN
INTRAVENOUS | Status: DC | PRN
  Administered 2019-07-17: 10:00:00 40 mL

## 2019-07-17 MED ORDER — PHENYLEPHRINE HCL (PRESSORS) 10 MG/ML IV SOLN
INTRAVENOUS | Status: AC
  Filled 2019-07-17: qty 1

## 2019-07-17 SURGICAL SUPPLY — 12 items
CATH ANGIO 5F 100CM .035 PIG (CATHETERS) ×3 IMPLANT
CATH BEACON 5 .035 100 JB2 TIP (CATHETERS) ×3 IMPLANT
CATH G 5FX100 (CATHETERS) ×3 IMPLANT
DEVICE PRESTO INFLATION (MISCELLANEOUS) ×3 IMPLANT
DEVICE STARCLOSE SE CLOSURE (Vascular Products) ×3 IMPLANT
DEVICE TORQUE .025-.038 (MISCELLANEOUS) ×3 IMPLANT
GLIDEWIRE ANGLED SS 035X260CM (WIRE) ×3 IMPLANT
KIT CAROTID MANIFOLD (MISCELLANEOUS) ×3 IMPLANT
SHEATH BRITE TIP 5FRX11 (SHEATH) ×3 IMPLANT
SYR MEDRAD MARK 7 150ML (SYRINGE) ×3 IMPLANT
TUBING CONTRAST HIGH PRESS 72 (TUBING) ×3 IMPLANT
WIRE J 3MM .035X145CM (WIRE) ×3 IMPLANT

## 2019-07-17 NOTE — H&P (Signed)
[facility] History & Physical Update  The patient was interviewed and re-examined.  The patient's previous History and Physical has been reviewed and is unchanged.  There is no change in the plan of care. We plan to proceed with the scheduled procedure.  Leotis Pain, MD  07/17/2019, 9:23 AM

## 2019-07-17 NOTE — Op Note (Signed)
[facility]   OPERATIVE NOTE  DATE: 07/17/2019  PRE-OPERATIVE DIAGNOSIS: 1.  Innominate artery stenosis/occlusion, retinal embolization on the right  POST-OPERATIVE DIAGNOSIS: Same as above  PROCEDURE: 1.   Ultrasound Guidance for vascular access right femoral artery 2.   Catheter placement into innominate artery and into left common carotid artery from right femoral approach 3.   Thoracic aortogram 4.   Cervical and cerebral left carotid angiograms 5.   StarClose closure device right femoral artery  SURGEON: Leotis Pain, MD  ASSISTANT(S): None  ANESTHESIA: Moderate conscious sedation  ESTIMATED BLOOD LOSS: 3 cc  FLUORO TIME: 6.3 minutes  CONTRAST: 40 cc  MODERATE CONSCIOUS SEDATION TIME: Approximately 20 minutes using 3 of Versed and 75 mcg of Fentanyl  FINDING(S): 1.  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 2.  Left carotid artery with very mild disease.  No intracranial disease with some left-to-right cross-filling seen.  SPECIMEN(S):  None  INDICATIONS:   Patient is a 79 y.o.female who presents with a lesion of the innominate artery on CT scan which was either stenotic or occlusive and extremely calcific. Catheter-based angiogram is performed for further evaluation and to determine if endovascular therapy was of benefit.  Risks and benefits are discussed and informed consent was obtained.  DESCRIPTION: After obtaining full informed written consent, the patient was brought back to the operating room and placed supine upon the vascular suite table.  After obtaining adequate anesthesia, the patient was prepped and draped in the standard fashion.  Moderate conscious sedation was administered during a face to face encounter with the patient throughout the procedure with my supervision of the RN administering  medicines and monitoring the patients vital signs and mental status throughout from the start of the procedure until the patient was taken to the recovery room. The right femoral artery was visualized with ultrasound and found to be calcific but patent. It was then accessed under direct ultrasound guidance without difficulty with a Seldinger needle. A J-wire and 5 French sheath were placed and a permanent image was recorded. The patient was given 5000 units of intravenous heparin. A pigtail catheter was placed into the ascending aorta and an LAO projection thoracic aortogram was performed. This showed clear occlusion of the innominate artery with retrograde filling of the subclavian artery through the vertebral artery as well as some retrograde filling of the carotid artery on delayed imaging.  There was a type III aortic arch.  There is no proximal stenosis of the left common carotid artery or left subclavian artery.  A Bentson Hanafee Wilson catheter was used to get into the innominate artery and probed this with a Glidewire but this was clearly not going to be easy to cross due to the complete occlusion, dense calcification, and the type III aortic arch so this approach was abandoned.  I then turned my attention back to the thoracic aorta and removed the catheter from the right side. I selectively cannulated the left common carotid artery without difficulty with a Bentson Hanafee Wilson catheter and advanced into the mid left common carotid artery. Selective imaging was then performed of the cervical and cerebral carotid artery on the left. Intracranial filling was brisk and there was left-to-right cross-filling. The cervical carotid artery was mildly diseased at the bifurcation but otherwise was a relatively normal vessel. Multiple views were taken in the cervical carotid artery  bilaterally. At this point, we had imaging to plan our treatment and we elected to terminate the procedure. The diagnostic catheter was  removed. Oblique arteriogram was performed of the right femoral artery and StarClose closure device was deployed in usual fashion with excellent hemostatic result. The patient tolerated the procedure well and was taken to the recovery room in stable condition.  COMPLICATIONS: None  CONDITION: Stable   Leotis Pain 07/17/2019 10:26 AM   This note was created with Dragon Medical transcription system. Any errors in dictation are purely unintentional.

## 2019-07-17 NOTE — Telephone Encounter (Signed)
Spoke with the patient and she is scheduled with Dr. Lucky Cowboy for surgery on 07/26/19. Patient will do pre-op on 07/20/19 phone call between 8-1 pm. Patients covid test is on 07/24/19 between 12:30-2:30 pm at the [facility]. Pre-surgical instructions will be mailed to the patient. Patient did write this information down due to not getting her mail at times.

## 2019-07-17 NOTE — Discharge Instructions (Signed)
Femoral Site Care This sheet gives you information about how to care for yourself after your procedure. Your health care provider may also give you more specific instructions. If you have problems or questions, contact your health care provider. What can I expect after the procedure? After the procedure, it is common to have:  Bruising that usually fades within 1-2 weeks.  Tenderness at the site. Follow these instructions at home: Wound care  Follow instructions from your health care provider about how to take care of your insertion site. Make sure you: ? Wash your hands with soap and water before you change your bandage (dressing). If soap and water are not available, use hand sanitizer. ? Change your dressing as told by your health care provider. ? Leave stitches (sutures), skin glue, or adhesive strips in place. These skin closures may need to stay in place for 2 weeks or longer. If adhesive strip edges start to loosen and curl up, you may trim the loose edges. Do not remove adhesive strips completely unless your health care provider tells you to do that.  Do not take baths, swim, or use a hot tub until your health care provider approves.  You may shower 24-48 hours after the procedure or as told by your health care provider. ? Gently wash the site with plain soap and water. ? Pat the area dry with a clean towel. ? Do not rub the site. This may cause bleeding.  Do not apply powder or lotion to the site. Keep the site clean and dry.  Check your femoral site every day for signs of infection. Check for: ? Redness, swelling, or pain. ? Fluid or blood. ? Warmth. ? Pus or a bad smell. Activity  For the first 2-3 days after your procedure, or as long as directed: ? Avoid climbing stairs as much as possible. ? Do not squat.  Do not lift anything that is heavier than 10 lb (4.5 kg), or the limit that you are told, until your health care provider says that it is safe.  Rest as  directed. ? Avoid sitting for a long time without moving. Get up to take short walks every 1-2 hours.  Do not drive for 24 hours if you were given a medicine to help you relax (sedative). General instructions  Take over-the-counter and prescription medicines only as told by your health care provider.  Keep all follow-up visits as told by your health care provider. This is important. Contact a health care provider if you have:  A fever or chills.  You have redness, swelling, or pain around your insertion site. Get help right away if:  The catheter insertion area swells very fast.  You pass out.  You suddenly start to sweat or your skin gets clammy.  The catheter insertion area is bleeding, and the bleeding does not stop when you hold steady pressure on the area.  The area near or just beyond the catheter insertion site becomes pale, cool, tingly, or numb. These symptoms may represent a serious problem that is an emergency. Do not wait to see if the symptoms will go away. Get medical help right away. Call your local emergency services (911 in the [place] Do not drive yourself to the hospital. Summary  After the procedure, it is common to have bruising that usually fades within 1-2 weeks.  Check your femoral site every day for signs of infection.  Do not lift anything that is heavier than 10 lb (4.5 kg), or the   limit that you are told, until your health care provider says that it is safe. This information is not intended to replace advice given to you by your health care provider. Make sure you discuss any questions you have with your health care provider. Document Revised: 06/14/2017 Document Reviewed: 06/14/2017 Elsevier Patient Education  2020 [employer] Moderate Conscious Sedation, Adult, Care After These instructions provide you with information about caring for yourself after your procedure. Your health care provider may also give you more specific instructions. Your  treatment has been planned according to current medical practices, but problems sometimes occur. Call your health care provider if you have any problems or questions after your procedure. What can I expect after the procedure? After your procedure, it is common:  To feel sleepy for several hours.  To feel clumsy and have poor balance for several hours.  To have poor judgment for several hours.  To vomit if you eat too soon. Follow these instructions at home: For at least 24 hours after the procedure:   Do not: ? Participate in activities where you could fall or become injured. ? Drive. ? Use heavy machinery. ? Drink alcohol. ? Take sleeping pills or medicines that cause drowsiness. ? Make important decisions or sign legal documents. ? Take care of children on your own.  Rest. Eating and drinking  Follow the diet recommended by your health care provider.  If you vomit: ? Drink water, juice, or soup when you can drink without vomiting. ? Make sure you have little or no nausea before eating solid foods. General instructions  Have a responsible adult stay with you until you are awake and alert.  Take over-the-counter and prescription medicines only as told by your health care provider.  If you smoke, do not smoke without supervision.  Keep all follow-up visits as told by your health care provider. This is important. Contact a health care provider if:  You keep feeling nauseous or you keep vomiting.  You feel light-headed.  You develop a rash.  You have a fever. Get help right away if:  You have trouble breathing. This information is not intended to replace advice given to you by your health care provider. Make sure you discuss any questions you have with your health care provider. Document Revised: 05/14/2017 Document Reviewed: 09/21/2015 Elsevier Patient Education  2020 [employer] Angiogram, Care After This sheet gives you information about how to care for  yourself after your procedure. Your doctor may also give you more specific instructions. If you have problems or questions, contact your doctor. Follow these instructions at home: Insertion site care  Follow instructions from your doctor about how to take care of your long, thin tube (catheter) insertion area. Make sure you: ? Wash your hands with soap and water before you change your bandage (dressing). If you cannot use soap and water, use hand sanitizer. ? Change your bandage as told by your doctor. ? Leave stitches (sutures), skin glue, or skin tape (adhesive) strips in place. They may need to stay in place for 2 weeks or longer. If tape strips get loose and curl up, you may trim the loose edges. Do not remove tape strips completely unless your doctor says it is okay.  Do not take baths, swim, or use a hot tub until your doctor says it is okay.  You may shower 24-48 hours after the procedure or as told by your doctor. ? Gently wash the area with plain soap and water. ?   Pat the area dry with a clean towel. ? Do not rub the area. This may cause bleeding.  Do not apply powder or lotion to the area. Keep the area clean and dry.  Check your insertion area every day for signs of infection. Check for: ? More redness, swelling, or pain. ? Fluid or blood. ? Warmth. ? Pus or a bad smell. Activity  Rest as told by your doctor, usually for 1-2 days.  Do not lift anything that is heavier than 10 lbs. (4.5 kg) or as told by your doctor.  Do not drive for 24 hours if you were given a medicine to help you relax (sedative).  Do not drive or use heavy machinery while taking prescription pain medicine. General instructions   Go back to your normal activities as told by your doctor, usually in about a week. Ask your doctor what activities are safe for you.  If the insertion area starts to bleed, lie flat and put pressure on the area. If the bleeding does not stop, get help right away. This is an  emergency.  Drink enough fluid to keep your pee (urine) clear or pale yellow.  Take over-the-counter and prescription medicines only as told by your doctor.  Keep all follow-up visits as told by your doctor. This is important. Contact a doctor if:  You have a fever.  You have chills.  You have more redness, swelling, or pain around your insertion area.  You have fluid or blood coming from your insertion area.  The insertion area feels warm to the touch.  You have pus or a bad smell coming from your insertion area.  You have more bruising around the insertion area.  Blood collects in the tissue around the insertion area (hematoma) that may be painful to the touch. Get help right away if:  You have a lot of pain in the insertion area.  The insertion area swells very fast.  The insertion area is bleeding, and the bleeding does not stop after holding steady pressure on the area.  The area near or just beyond the insertion area becomes pale, cool, tingly, or numb. These symptoms may be an emergency. Do not wait to see if the symptoms will go away. Get medical help right away. Call your local emergency services (911 in the [place] Do not drive yourself to the hospital. Summary  After the procedure, it is common to have bruising and tenderness at the long, thin tube insertion area.  After the procedure, it is important to rest and drink plenty of fluids.  Do not take baths, swim, or use a hot tub until your doctor says it is okay to do so. You may shower 24-48 hours after the procedure or as told by your doctor.  If the insertion area starts to bleed, lie flat and put pressure on the area. If the bleeding does not stop, get help right away. This is an emergency. This information is not intended to replace advice given to you by your health care provider. Make sure you discuss any questions you have with your health care provider. Document Revised: 05/14/2017 Document Reviewed:  05/26/2016 Elsevier Patient Education  2020 Elsevier Inc.  

## 2019-07-19 ENCOUNTER — Other Ambulatory Visit: Payer: Self-pay

## 2019-07-19 MED ORDER — FUROSEMIDE 40 MG PO TABS: ORAL_TABLET | ORAL | 1 refills | Status: DC

## 2019-07-19 MED ORDER — OMEPRAZOLE 20 MG PO CPDR: DELAYED_RELEASE_CAPSULE | ORAL | 1 refills | Status: DC

## 2019-07-20 ENCOUNTER — Other Ambulatory Visit: Payer: Self-pay

## 2019-07-20 ENCOUNTER — Encounter
Admission: RE | Admit: 2019-07-20 | Discharge: 2019-07-20 | Disposition: A | Discharge status: Home / Self Care | Payer: Medicare HMO | Source: Ambulatory Visit | Attending: Vascular Surgery | Admitting: Vascular Surgery

## 2019-07-20 NOTE — Patient Instructions (Signed)
Your procedure is scheduled on: July 26, 2019 [facility] Report to Day Surgery on the 2nd floor of the [facility]. To find out your arrival time, please call [phone] between 1PM - 3PM on: July 25, 2019  REMEMBER: Instructions that are not followed completely may result in serious medical risk, up to and including death; or upon the discretion of your surgeon and anesthesiologist your surgery may need to be rescheduled.  Do not eat food after midnight the night before surgery.  No gum chewing, lozengers or hard candies.  You may however, drink CLEAR liquids up to 2 hours before you are scheduled to arrive for your surgery. Do not drink anything within 2 hours of the start of your surgery.  Clear liquids include: - water  - apple juice without pulp - CLEAR gatorade - black coffee or tea (Do NOT add milk or creamers to the coffee or tea) Do NOT drink anything that is not on this list.  Type 1 and Type 2 diabetics should only drink water.  No Alcohol for 24 hours before or after surgery.  No Smoking including e-cigarettes for 24 hours prior to surgery.  No chewable tobacco products for at least 6 hours prior to surgery.  No nicotine patches on the day of surgery.  On the morning of surgery brush your teeth with toothpaste and water, you may rinse your mouth with mouthwash if you wish. Do not swallow any toothpaste or mouthwash.  Notify your doctor if there is any change in your medical condition (cold, fever, infection).  Do not wear jewelry, make-up, hairpins, clips or nail polish.  Do not wear lotions, powders, or perfumes.   Do not shave 48 hours prior to surgery.   Contacts and dentures may not be worn into surgery.  Do not bring valuables to the hospital, including drivers license, insurance or credit cards.  [facility] is not responsible for any belongings or valuables.   TAKE THESE MEDICATIONS THE MORNING OF SURGERY: OMEPRAZOLE (take one the night  before and one on the morning of surgery - helps to prevent nausea after surgery.) CARVEDILOL LEVOTHYROXINE PREDNISONE CLONIDINE TRAMADOL   TAKE A SHOWER MORNING OF SURGERY   LAST DOSE OF PLAVIX IS Saturday 07/22/2019  Stop Anti-inflammatories (NSAIDS) such as Advil, Aleve, Ibuprofen, Motrin, Naproxen, Naprosyn and Aspirin based products such as Excedrin, Goodys Powder, BC Powder. (May take Tylenol or Acetaminophen if needed.)  Stop ANY OVER THE COUNTER supplements until after surgery.STOP FISH OIL VIT E, TART  CHERRY AND TURMERIC (May continue Vitamin D, Vitamin B, and multivitamin POTASSIUM AND MAGNESIUM.)  Wear comfortable clothing (specific to your surgery type) to the hospital.  Plan for stool softeners for home use.  If you are being admitted to the hospital overnight, leave your suitcase in the car. After surgery it may be brought to your room.  If you are being discharged the day of surgery, you will not be allowed to drive home. You will need a responsible adult to drive you home and stay with you that night.   If you are taking public transportation, you will need to have a responsible adult with you. Please confirm with your physician that it is acceptable to use public transportation.   Please call [phone] if you have any questions about these instructions.

## 2019-07-21 ENCOUNTER — Other Ambulatory Visit
Admission: RE | Admit: 2019-07-21 | Discharge: 2019-07-21 | Disposition: A | Discharge status: Home / Self Care | Payer: Medicare HMO | Source: Ambulatory Visit | Attending: Vascular Surgery | Admitting: Vascular Surgery

## 2019-07-21 DIAGNOSIS — Z01818 Encounter for other preprocedural examination: Secondary | ICD-10-CM | POA: Insufficient documentation

## 2019-07-21 DIAGNOSIS — R9431 Abnormal electrocardiogram [ECG] [EKG]: Secondary | ICD-10-CM | POA: Insufficient documentation

## 2019-07-21 LAB — BASIC METABOLIC PANEL
Anion gap: 13 (ref 5–15)
BUN: 31 mg/dL — ABNORMAL HIGH (ref 8–23)
CO2: 27 mmol/L (ref 22–32)
Calcium: 9.4 mg/dL (ref 8.9–10.3)
Chloride: 95 mmol/L — ABNORMAL LOW (ref 98–111)
Creatinine, Ser: 1.09 mg/dL — ABNORMAL HIGH (ref 0.44–1.00)
GFR calc Af Amer: 56 mL/min — ABNORMAL LOW (ref >60)
GFR calc non Af Amer: 48 mL/min — ABNORMAL LOW (ref >60)
Glucose, Bld: 117 mg/dL — ABNORMAL HIGH (ref 70–99)
Potassium: 4.1 mmol/L (ref 3.5–5.1)
Sodium: 135 mmol/L (ref 135–145)

## 2019-07-21 LAB — CBC WITH DIFFERENTIAL/PLATELET
Abs Immature Granulocytes: 0.01 10*3/uL (ref 0.00–0.07)
Basophils Absolute: 0.1 10*3/uL (ref 0.0–0.1)
Basophils Relative: 1 %
Eosinophils Absolute: 0.3 10*3/uL (ref 0.0–0.5)
Eosinophils Relative: 6 %
HCT: 37.2 % (ref 36.0–46.0)
Hemoglobin: 12.6 g/dL (ref 12.0–15.0)
Immature Granulocytes: 0 %
Lymphocytes Relative: 13 %
Lymphs Abs: 0.6 10*3/uL — ABNORMAL LOW (ref 0.7–4.0)
MCH: 33.3 pg (ref 26.0–34.0)
MCHC: 33.9 g/dL (ref 30.0–36.0)
MCV: 98.4 fL (ref 80.0–100.0)
Monocytes Absolute: 0.4 10*3/uL (ref 0.1–1.0)
Monocytes Relative: 8 %
Neutro Abs: 3.2 10*3/uL (ref 1.7–7.7)
Neutrophils Relative %: 72 %
Platelets: 130 10*3/uL — ABNORMAL LOW (ref 150–400)
RBC: 3.78 MIL/uL — ABNORMAL LOW (ref 3.87–5.11)
RDW: 12.3 % (ref 11.5–15.5)
WBC: 4.5 10*3/uL (ref 4.0–10.5)
nRBC: 0 % (ref 0.0–0.2)

## 2019-07-21 LAB — TYPE AND SCREEN
ABO/RH(D): O POS
Antibody Screen: NEGATIVE

## 2019-07-21 LAB — PROTIME-INR
INR: 1.1 (ref 0.8–1.2)
Prothrombin Time: 13.6 seconds (ref 11.4–15.2)

## 2019-07-21 LAB — APTT: aPTT: 37 seconds — ABNORMAL HIGH (ref 24–36)

## 2019-07-24 ENCOUNTER — Other Ambulatory Visit: Payer: Self-pay

## 2019-07-24 ENCOUNTER — Other Ambulatory Visit
Admission: RE | Admit: 2019-07-24 | Discharge: 2019-07-24 | Disposition: A | Discharge status: Home / Self Care | Payer: Medicare HMO | Source: Ambulatory Visit | Attending: Vascular Surgery | Admitting: Vascular Surgery

## 2019-07-24 DIAGNOSIS — I708 Atherosclerosis of other arteries: Secondary | ICD-10-CM | POA: Diagnosis present

## 2019-07-24 DIAGNOSIS — I10 Essential (primary) hypertension: Secondary | ICD-10-CM | POA: Diagnosis present

## 2019-07-24 DIAGNOSIS — K760 Fatty (change of) liver, not elsewhere classified: Secondary | ICD-10-CM | POA: Diagnosis present

## 2019-07-24 DIAGNOSIS — E039 Hypothyroidism, unspecified: Secondary | ICD-10-CM | POA: Diagnosis present

## 2019-07-24 DIAGNOSIS — J811 Chronic pulmonary edema: Secondary | ICD-10-CM | POA: Diagnosis not present

## 2019-07-24 DIAGNOSIS — K219 Gastro-esophageal reflux disease without esophagitis: Secondary | ICD-10-CM | POA: Diagnosis present

## 2019-07-24 DIAGNOSIS — K08109 Complete loss of teeth, unspecified cause, unspecified class: Secondary | ICD-10-CM | POA: Diagnosis present

## 2019-07-24 DIAGNOSIS — G459 Transient cerebral ischemic attack, unspecified: Secondary | ICD-10-CM | POA: Diagnosis not present

## 2019-07-24 DIAGNOSIS — Z9089 Acquired absence of other organs: Secondary | ICD-10-CM | POA: Diagnosis not present

## 2019-07-24 DIAGNOSIS — M1990 Unspecified osteoarthritis, unspecified site: Secondary | ICD-10-CM | POA: Diagnosis present

## 2019-07-24 DIAGNOSIS — Z9049 Acquired absence of other specified parts of digestive tract: Secondary | ICD-10-CM | POA: Diagnosis not present

## 2019-07-24 DIAGNOSIS — I6523 Occlusion and stenosis of bilateral carotid arteries: Secondary | ICD-10-CM | POA: Diagnosis present

## 2019-07-24 DIAGNOSIS — J8410 Pulmonary fibrosis, unspecified: Secondary | ICD-10-CM | POA: Diagnosis present

## 2019-07-24 DIAGNOSIS — I081 Rheumatic disorders of both mitral and tricuspid valves: Secondary | ICD-10-CM | POA: Diagnosis present

## 2019-07-24 DIAGNOSIS — I771 Stricture of artery: Secondary | ICD-10-CM | POA: Diagnosis not present

## 2019-07-24 DIAGNOSIS — Z90710 Acquired absence of both cervix and uterus: Secondary | ICD-10-CM | POA: Diagnosis not present

## 2019-07-24 DIAGNOSIS — J449 Chronic obstructive pulmonary disease, unspecified: Secondary | ICD-10-CM | POA: Diagnosis present

## 2019-07-24 DIAGNOSIS — I658 Occlusion and stenosis of other precerebral arteries: Secondary | ICD-10-CM | POA: Diagnosis not present

## 2019-07-24 DIAGNOSIS — Z96651 Presence of right artificial knee joint: Secondary | ICD-10-CM | POA: Diagnosis present

## 2019-07-24 DIAGNOSIS — Z01812 Encounter for preprocedural laboratory examination: Secondary | ICD-10-CM | POA: Insufficient documentation

## 2019-07-24 DIAGNOSIS — I6529 Occlusion and stenosis of unspecified carotid artery: Secondary | ICD-10-CM | POA: Diagnosis not present

## 2019-07-24 DIAGNOSIS — I69398 Other sequelae of cerebral infarction: Secondary | ICD-10-CM | POA: Diagnosis not present

## 2019-07-24 DIAGNOSIS — E669 Obesity, unspecified: Secondary | ICD-10-CM | POA: Diagnosis present

## 2019-07-24 DIAGNOSIS — D472 Monoclonal gammopathy: Secondary | ICD-10-CM | POA: Diagnosis present

## 2019-07-24 DIAGNOSIS — E785 Hyperlipidemia, unspecified: Secondary | ICD-10-CM | POA: Diagnosis present

## 2019-07-24 DIAGNOSIS — I959 Hypotension, unspecified: Secondary | ICD-10-CM | POA: Diagnosis not present

## 2019-07-24 DIAGNOSIS — M8580 Other specified disorders of bone density and structure, unspecified site: Secondary | ICD-10-CM | POA: Diagnosis present

## 2019-07-24 DIAGNOSIS — M109 Gout, unspecified: Secondary | ICD-10-CM | POA: Diagnosis present

## 2019-07-24 DIAGNOSIS — Z20822 Contact with and (suspected) exposure to covid-19: Secondary | ICD-10-CM | POA: Diagnosis present

## 2019-07-24 DIAGNOSIS — Z6838 Body mass index (BMI) 38.0-38.9, adult: Secondary | ICD-10-CM | POA: Diagnosis not present

## 2019-07-24 NOTE — Pre-Procedure Instructions (Signed)
Abnormal APTT and BMP faxed to Dr Bunnie Domino office

## 2019-07-25 LAB — SARS CORONAVIRUS 2 (TAT 6-24 HRS): SARS Coronavirus 2: NEGATIVE

## 2019-07-25 MED ORDER — CEFAZOLIN SODIUM-DEXTROSE 2-4 GM/100ML-% IV SOLN
2.0000 g | INTRAVENOUS | Status: AC
  Administered 2019-07-26: 2 g via INTRAVENOUS

## 2019-07-26 ENCOUNTER — Inpatient Hospital Stay
Admission: RE | Admit: 2019-07-26 | Discharge: 2019-07-31 | DRG: 039 | Disposition: A | Discharge status: Home Health | Payer: Medicare HMO | Attending: Vascular Surgery | Admitting: Vascular Surgery

## 2019-07-26 ENCOUNTER — Encounter
Admission: RE | Disposition: A | Discharge status: Home / Self Care | Payer: Self-pay | Source: Home / Self Care | Attending: Vascular Surgery

## 2019-07-26 ENCOUNTER — Other Ambulatory Visit: Payer: Self-pay

## 2019-07-26 ENCOUNTER — Inpatient Hospital Stay: Payer: Medicare HMO | Admitting: Anesthesiology

## 2019-07-26 ENCOUNTER — Inpatient Hospital Stay: Payer: Medicare HMO

## 2019-07-26 ENCOUNTER — Encounter: Payer: Self-pay | Admitting: Vascular Surgery

## 2019-07-26 DIAGNOSIS — K219 Gastro-esophageal reflux disease without esophagitis: Secondary | ICD-10-CM | POA: Diagnosis present

## 2019-07-26 DIAGNOSIS — M1990 Unspecified osteoarthritis, unspecified site: Secondary | ICD-10-CM | POA: Diagnosis present

## 2019-07-26 DIAGNOSIS — Z818 Family history of other mental and behavioral disorders: Secondary | ICD-10-CM

## 2019-07-26 DIAGNOSIS — Z7982 Long term (current) use of aspirin: Secondary | ICD-10-CM

## 2019-07-26 DIAGNOSIS — M8580 Other specified disorders of bone density and structure, unspecified site: Secondary | ICD-10-CM | POA: Diagnosis present

## 2019-07-26 DIAGNOSIS — G459 Transient cerebral ischemic attack, unspecified: Secondary | ICD-10-CM | POA: Diagnosis not present

## 2019-07-26 DIAGNOSIS — E039 Hypothyroidism, unspecified: Secondary | ICD-10-CM | POA: Diagnosis present

## 2019-07-26 DIAGNOSIS — Z820 Family history of epilepsy and other diseases of the nervous system: Secondary | ICD-10-CM

## 2019-07-26 DIAGNOSIS — Z87891 Personal history of nicotine dependence: Secondary | ICD-10-CM

## 2019-07-26 DIAGNOSIS — Z20822 Contact with and (suspected) exposure to covid-19: Secondary | ICD-10-CM | POA: Diagnosis present

## 2019-07-26 DIAGNOSIS — Z9049 Acquired absence of other specified parts of digestive tract: Secondary | ICD-10-CM | POA: Diagnosis not present

## 2019-07-26 DIAGNOSIS — Z833 Family history of diabetes mellitus: Secondary | ICD-10-CM

## 2019-07-26 DIAGNOSIS — Z09 Encounter for follow-up examination after completed treatment for conditions other than malignant neoplasm: Secondary | ICD-10-CM

## 2019-07-26 DIAGNOSIS — Z79890 Hormone replacement therapy (postmenopausal): Secondary | ICD-10-CM

## 2019-07-26 DIAGNOSIS — I708 Atherosclerosis of other arteries: Secondary | ICD-10-CM | POA: Diagnosis present

## 2019-07-26 DIAGNOSIS — I10 Essential (primary) hypertension: Secondary | ICD-10-CM | POA: Diagnosis present

## 2019-07-26 DIAGNOSIS — K08109 Complete loss of teeth, unspecified cause, unspecified class: Secondary | ICD-10-CM | POA: Diagnosis present

## 2019-07-26 DIAGNOSIS — J8410 Pulmonary fibrosis, unspecified: Secondary | ICD-10-CM | POA: Diagnosis present

## 2019-07-26 DIAGNOSIS — Z9089 Acquired absence of other organs: Secondary | ICD-10-CM

## 2019-07-26 DIAGNOSIS — Z8269 Family history of other diseases of the musculoskeletal system and connective tissue: Secondary | ICD-10-CM

## 2019-07-26 DIAGNOSIS — Z90710 Acquired absence of both cervix and uterus: Secondary | ICD-10-CM

## 2019-07-26 DIAGNOSIS — M109 Gout, unspecified: Secondary | ICD-10-CM | POA: Diagnosis present

## 2019-07-26 DIAGNOSIS — I081 Rheumatic disorders of both mitral and tricuspid valves: Secondary | ICD-10-CM | POA: Diagnosis present

## 2019-07-26 DIAGNOSIS — E669 Obesity, unspecified: Secondary | ICD-10-CM | POA: Diagnosis present

## 2019-07-26 DIAGNOSIS — D472 Monoclonal gammopathy: Secondary | ICD-10-CM | POA: Diagnosis present

## 2019-07-26 DIAGNOSIS — Z8249 Family history of ischemic heart disease and other diseases of the circulatory system: Secondary | ICD-10-CM

## 2019-07-26 DIAGNOSIS — I959 Hypotension, unspecified: Secondary | ICD-10-CM | POA: Diagnosis not present

## 2019-07-26 DIAGNOSIS — Z7952 Long term (current) use of systemic steroids: Secondary | ICD-10-CM

## 2019-07-26 DIAGNOSIS — Z8349 Family history of other endocrine, nutritional and metabolic diseases: Secondary | ICD-10-CM

## 2019-07-26 DIAGNOSIS — J449 Chronic obstructive pulmonary disease, unspecified: Secondary | ICD-10-CM | POA: Diagnosis present

## 2019-07-26 DIAGNOSIS — K760 Fatty (change of) liver, not elsewhere classified: Secondary | ICD-10-CM | POA: Diagnosis present

## 2019-07-26 DIAGNOSIS — Z79899 Other long term (current) drug therapy: Secondary | ICD-10-CM

## 2019-07-26 DIAGNOSIS — Z6838 Body mass index (BMI) 38.0-38.9, adult: Secondary | ICD-10-CM

## 2019-07-26 DIAGNOSIS — M064 Inflammatory polyarthropathy: Secondary | ICD-10-CM

## 2019-07-26 DIAGNOSIS — Z96651 Presence of right artificial knee joint: Secondary | ICD-10-CM | POA: Diagnosis present

## 2019-07-26 DIAGNOSIS — Z881 Allergy status to other antibiotic agents status: Secondary | ICD-10-CM

## 2019-07-26 DIAGNOSIS — E785 Hyperlipidemia, unspecified: Secondary | ICD-10-CM | POA: Diagnosis present

## 2019-07-26 DIAGNOSIS — I658 Occlusion and stenosis of other precerebral arteries: Secondary | ICD-10-CM | POA: Diagnosis not present

## 2019-07-26 DIAGNOSIS — Z791 Long term (current) use of non-steroidal anti-inflammatories (NSAID): Secondary | ICD-10-CM

## 2019-07-26 DIAGNOSIS — Z8261 Family history of arthritis: Secondary | ICD-10-CM

## 2019-07-26 DIAGNOSIS — I6523 Occlusion and stenosis of bilateral carotid arteries: Secondary | ICD-10-CM | POA: Diagnosis present

## 2019-07-26 DIAGNOSIS — I771 Stricture of artery: Secondary | ICD-10-CM | POA: Diagnosis present

## 2019-07-26 DIAGNOSIS — Z79891 Long term (current) use of opiate analgesic: Secondary | ICD-10-CM

## 2019-07-26 DIAGNOSIS — I69398 Other sequelae of cerebral infarction: Secondary | ICD-10-CM | POA: Diagnosis not present

## 2019-07-26 DIAGNOSIS — Z7902 Long term (current) use of antithrombotics/antiplatelets: Secondary | ICD-10-CM

## 2019-07-26 DIAGNOSIS — Z885 Allergy status to narcotic agent status: Secondary | ICD-10-CM

## 2019-07-26 HISTORY — PX: CAROTID-SUBCLAVIAN BYPASS GRAFT: SHX910

## 2019-07-26 LAB — GLUCOSE, CAPILLARY: Glucose-Capillary: 166 mg/dL — ABNORMAL HIGH (ref 70–99)

## 2019-07-26 LAB — CBC
HCT: 31.5 % — ABNORMAL LOW (ref 36.0–46.0)
Hemoglobin: 10.7 g/dL — ABNORMAL LOW (ref 12.0–15.0)
MCH: 32.8 pg (ref 26.0–34.0)
MCHC: 34 g/dL (ref 30.0–36.0)
MCV: 96.6 fL (ref 80.0–100.0)
Platelets: 135 10*3/uL — ABNORMAL LOW (ref 150–400)
RBC: 3.26 MIL/uL — ABNORMAL LOW (ref 3.87–5.11)
RDW: 12.5 % (ref 11.5–15.5)
WBC: 9.4 10*3/uL (ref 4.0–10.5)
nRBC: 0 % (ref 0.0–0.2)

## 2019-07-26 LAB — ABO/RH: ABO/RH(D): O POS

## 2019-07-26 LAB — MRSA PCR SCREENING: MRSA by PCR: NEGATIVE

## 2019-07-26 SURGERY — CREATION, BYPASS, ARTERIAL, SUBCLAVIAN TO CAROTID, USING GRAFT
Anesthesia: General

## 2019-07-26 MED ORDER — DEXAMETHASONE SODIUM PHOSPHATE 10 MG/ML IJ SOLN
INTRAMUSCULAR | Status: DC | PRN
  Administered 2019-07-26: 5 mg via INTRAVENOUS

## 2019-07-26 MED ORDER — OMEGA-3-ACID ETHYL ESTERS 1 G PO CAPS
1.0000 g | ORAL_CAPSULE | Freq: Two times a day (BID) | ORAL | Status: DC
  Administered 2019-07-27 – 2019-07-31 (×7): 1 g via ORAL
  Filled 2019-07-26 (×10): qty 1

## 2019-07-26 MED ORDER — ONDANSETRON HCL 4 MG/2ML IJ SOLN: 4.0000 mg | Freq: Once | INTRAMUSCULAR | Status: DC | PRN

## 2019-07-26 MED ORDER — CLONIDINE HCL 0.1 MG PO TABS
0.1000 mg | ORAL_TABLET | Freq: Two times a day (BID) | ORAL | Status: DC
  Administered 2019-07-26 – 2019-07-31 (×10): 0.1 mg via ORAL
  Filled 2019-07-26 (×9): qty 1

## 2019-07-26 MED ORDER — PANTOPRAZOLE SODIUM 40 MG PO TBEC
40.0000 mg | DELAYED_RELEASE_TABLET | Freq: Every day | ORAL | Status: DC
  Administered 2019-07-27 – 2019-07-31 (×5): 40 mg via ORAL
  Filled 2019-07-26 (×5): qty 1

## 2019-07-26 MED ORDER — OXYCODONE-ACETAMINOPHEN 5-325 MG PO TABS
1.0000 | ORAL_TABLET | Freq: Four times a day (QID) | ORAL | Status: DC | PRN
  Administered 2019-07-26 – 2019-07-27 (×2): 1 via ORAL
  Administered 2019-07-27 (×2): 2 via ORAL
  Administered 2019-07-30: 1 via ORAL
  Filled 2019-07-26: qty 2
  Filled 2019-07-26 (×2): qty 1
  Filled 2019-07-26: qty 2
  Filled 2019-07-26: qty 1
  Filled 2019-07-26: qty 2

## 2019-07-26 MED ORDER — TRAMADOL HCL 50 MG PO TABS
50.0000 mg | ORAL_TABLET | Freq: Four times a day (QID) | ORAL | Status: DC | PRN
  Administered 2019-07-27 – 2019-07-30 (×3): 50 mg via ORAL
  Filled 2019-07-26 (×4): qty 1

## 2019-07-26 MED ORDER — CARVEDILOL 12.5 MG PO TABS
12.5000 mg | ORAL_TABLET | Freq: Two times a day (BID) | ORAL | Status: DC
  Administered 2019-07-27 – 2019-07-31 (×7): 12.5 mg via ORAL
  Filled 2019-07-26 (×8): qty 1

## 2019-07-26 MED ORDER — ATORVASTATIN CALCIUM 10 MG PO TABS
10.0000 mg | ORAL_TABLET | Freq: Every day | ORAL | Status: DC
  Administered 2019-07-26 – 2019-07-30 (×5): 10 mg via ORAL
  Filled 2019-07-26 (×5): qty 1

## 2019-07-26 MED ORDER — PROPOFOL 10 MG/ML IV BOLUS
INTRAVENOUS | Status: DC | PRN
  Administered 2019-07-26: 100 mg via INTRAVENOUS

## 2019-07-26 MED ORDER — GUAIFENESIN-DM 100-10 MG/5ML PO SYRP: 15.0000 mL | ORAL_SOLUTION | ORAL | Status: DC | PRN

## 2019-07-26 MED ORDER — LIDOCAINE HCL (PF) 1 % IJ SOLN
INTRAMUSCULAR | Status: AC
  Filled 2019-07-26: qty 30

## 2019-07-26 MED ORDER — ACETAMINOPHEN 325 MG PO TABS
325.0000 mg | ORAL_TABLET | ORAL | Status: DC | PRN
  Administered 2019-07-26: 650 mg via ORAL
  Filled 2019-07-26: qty 2

## 2019-07-26 MED ORDER — ROCURONIUM BROMIDE 100 MG/10ML IV SOLN
INTRAVENOUS | Status: DC | PRN
  Administered 2019-07-26: 20 mg via INTRAVENOUS
  Administered 2019-07-26: 50 mg via INTRAVENOUS
  Administered 2019-07-26: 10 mg via INTRAVENOUS

## 2019-07-26 MED ORDER — PROPOFOL 10 MG/ML IV BOLUS
INTRAVENOUS | Status: AC
  Filled 2019-07-26: qty 20

## 2019-07-26 MED ORDER — CLOPIDOGREL BISULFATE 75 MG PO TABS
75.0000 mg | ORAL_TABLET | Freq: Every day | ORAL | Status: DC
  Administered 2019-07-27 – 2019-07-31 (×5): 75 mg via ORAL
  Filled 2019-07-26 (×5): qty 1

## 2019-07-26 MED ORDER — POTASSIUM CHLORIDE CRYS ER 10 MEQ PO TBCR
10.0000 meq | EXTENDED_RELEASE_TABLET | Freq: Every day | ORAL | Status: DC
  Administered 2019-07-27 – 2019-07-31 (×4): 10 meq via ORAL
  Filled 2019-07-26 (×6): qty 1

## 2019-07-26 MED ORDER — VISTASEAL 10 ML SINGLE DOSE KIT
PACK | CUTANEOUS | Status: AC
  Filled 2019-07-26: qty 10

## 2019-07-26 MED ORDER — SODIUM CHLORIDE 0.9 % IV SOLN: INTRAVENOUS | Status: DC

## 2019-07-26 MED ORDER — LIDOCAINE HCL (CARDIAC) PF 100 MG/5ML IV SOSY
PREFILLED_SYRINGE | INTRAVENOUS | Status: DC | PRN
  Administered 2019-07-26: 100 mg via INTRAVENOUS

## 2019-07-26 MED ORDER — ONDANSETRON HCL 4 MG/2ML IJ SOLN
INTRAMUSCULAR | Status: DC | PRN
  Administered 2019-07-26: 4 mg via INTRAVENOUS

## 2019-07-26 MED ORDER — CHLORHEXIDINE GLUCONATE CLOTH 2 % EX PADS
6.0000 | MEDICATED_PAD | Freq: Every day | CUTANEOUS | Status: DC
  Administered 2019-07-26 – 2019-07-28 (×3): 6 via TOPICAL

## 2019-07-26 MED ORDER — PHENOL 1.4 % MT LIQD
1.0000 | OROMUCOSAL | Status: DC | PRN
  Filled 2019-07-26: qty 177

## 2019-07-26 MED ORDER — OCUVITE-LUTEIN PO CAPS
1.0000 | ORAL_CAPSULE | Freq: Two times a day (BID) | ORAL | Status: DC
  Administered 2019-07-27 – 2019-07-31 (×8): 1 via ORAL
  Filled 2019-07-26 (×11): qty 1

## 2019-07-26 MED ORDER — FENTANYL CITRATE (PF) 100 MCG/2ML IJ SOLN
INTRAMUSCULAR | Status: AC
  Filled 2019-07-26: qty 2

## 2019-07-26 MED ORDER — LOSARTAN POTASSIUM 50 MG PO TABS
100.0000 mg | ORAL_TABLET | Freq: Every day | ORAL | Status: DC
  Administered 2019-07-27 – 2019-07-31 (×5): 100 mg via ORAL
  Filled 2019-07-26 (×5): qty 2

## 2019-07-26 MED ORDER — VISTASEAL 10 ML SINGLE DOSE KIT
PACK | CUTANEOUS | Status: DC | PRN
  Administered 2019-07-26: 10 mL via TOPICAL

## 2019-07-26 MED ORDER — HEPARIN SODIUM (PORCINE) 10000 UNIT/ML IJ SOLN
INTRAMUSCULAR | Status: AC
  Filled 2019-07-26: qty 1

## 2019-07-26 MED ORDER — OXYCODONE HCL 5 MG PO TABS: 5.0000 mg | ORAL_TABLET | ORAL | Status: DC | PRN

## 2019-07-26 MED ORDER — CEFAZOLIN SODIUM-DEXTROSE 2-4 GM/100ML-% IV SOLN
INTRAVENOUS | Status: AC
  Filled 2019-07-26: qty 100

## 2019-07-26 MED ORDER — LEVOTHYROXINE SODIUM 137 MCG PO TABS
137.0000 ug | ORAL_TABLET | Freq: Every day | ORAL | Status: DC
  Administered 2019-07-27 – 2019-07-31 (×5): 137 ug via ORAL
  Filled 2019-07-26 (×6): qty 1

## 2019-07-26 MED ORDER — FENTANYL CITRATE (PF) 100 MCG/2ML IJ SOLN
INTRAMUSCULAR | Status: DC | PRN
  Administered 2019-07-26 (×2): 50 ug via INTRAVENOUS

## 2019-07-26 MED ORDER — LACTATED RINGERS IV SOLN: INTRAVENOUS | Status: DC | PRN

## 2019-07-26 MED ORDER — SODIUM CHLORIDE 0.9 % IV SOLN
INTRAVENOUS | Status: DC | PRN
  Administered 2019-07-26: 11:00:00 40 ug/min via INTRAVENOUS

## 2019-07-26 MED ORDER — POTASSIUM CHLORIDE CRYS ER 20 MEQ PO TBCR: 20.0000 meq | EXTENDED_RELEASE_TABLET | Freq: Every day | ORAL | Status: DC | PRN

## 2019-07-26 MED ORDER — ACETAMINOPHEN 650 MG RE SUPP: 325.0000 mg | RECTAL | Status: DC | PRN

## 2019-07-26 MED ORDER — ONDANSETRON HCL 4 MG/2ML IJ SOLN: 4.0000 mg | Freq: Four times a day (QID) | INTRAMUSCULAR | Status: DC | PRN

## 2019-07-26 MED ORDER — ACETAMINOPHEN 10 MG/ML IV SOLN
INTRAVENOUS | Status: AC
  Filled 2019-07-26: qty 100

## 2019-07-26 MED ORDER — LIDOCAINE HCL (PF) 2 % IJ SOLN
INTRAMUSCULAR | Status: AC
  Filled 2019-07-26: qty 10

## 2019-07-26 MED ORDER — HYDROCHLOROTHIAZIDE 25 MG PO TABS
25.0000 mg | ORAL_TABLET | Freq: Every day | ORAL | Status: DC
  Administered 2019-07-27 – 2019-07-31 (×5): 25 mg via ORAL
  Filled 2019-07-26 (×5): qty 1

## 2019-07-26 MED ORDER — FENTANYL CITRATE (PF) 100 MCG/2ML IJ SOLN
25.0000 ug | INTRAMUSCULAR | Status: DC | PRN
  Administered 2019-07-26 (×3): 25 ug via INTRAVENOUS

## 2019-07-26 MED ORDER — LACTATED RINGERS IV SOLN: INTRAVENOUS | Status: DC

## 2019-07-26 MED ORDER — HYDRALAZINE HCL 20 MG/ML IJ SOLN: 5.0000 mg | INTRAMUSCULAR | Status: DC | PRN

## 2019-07-26 MED ORDER — DOCUSATE SODIUM 100 MG PO CAPS
100.0000 mg | ORAL_CAPSULE | Freq: Every day | ORAL | Status: DC
  Administered 2019-07-27 – 2019-07-31 (×5): 100 mg via ORAL
  Filled 2019-07-26 (×5): qty 1

## 2019-07-26 MED ORDER — SUGAMMADEX SODIUM 500 MG/5ML IV SOLN
INTRAVENOUS | Status: DC | PRN
  Administered 2019-07-26: 200 mg via INTRAVENOUS

## 2019-07-26 MED ORDER — ASCORBIC ACID 500 MG PO TABS
500.0000 mg | ORAL_TABLET | Freq: Every day | ORAL | Status: DC
  Administered 2019-07-27 – 2019-07-31 (×5): 500 mg via ORAL
  Filled 2019-07-26 (×4): qty 1

## 2019-07-26 MED ORDER — FUROSEMIDE 40 MG PO TABS
40.0000 mg | ORAL_TABLET | Freq: Two times a day (BID) | ORAL | Status: DC
  Administered 2019-07-26 – 2019-07-31 (×9): 40 mg via ORAL
  Filled 2019-07-26 (×2): qty 2
  Filled 2019-07-26: qty 1
  Filled 2019-07-26 (×2): qty 2
  Filled 2019-07-26: qty 1
  Filled 2019-07-26: qty 2
  Filled 2019-07-26: qty 1
  Filled 2019-07-26 (×3): qty 2

## 2019-07-26 MED ORDER — MAGNESIUM OXIDE 400 (241.3 MG) MG PO TABS
400.0000 mg | ORAL_TABLET | Freq: Every day | ORAL | Status: DC
  Administered 2019-07-27 – 2019-07-31 (×5): 400 mg via ORAL
  Filled 2019-07-26 (×5): qty 1

## 2019-07-26 MED ORDER — ONDANSETRON HCL 4 MG/2ML IJ SOLN
INTRAMUSCULAR | Status: AC
  Filled 2019-07-26: qty 2

## 2019-07-26 MED ORDER — HEPARIN SODIUM (PORCINE) 1000 UNIT/ML IJ SOLN
INTRAMUSCULAR | Status: DC | PRN
  Administered 2019-07-26: 5000 [IU] via INTRAVENOUS

## 2019-07-26 MED ORDER — ALLOPURINOL 100 MG PO TABS
200.0000 mg | ORAL_TABLET | Freq: Every day | ORAL | Status: DC
  Administered 2019-07-26 – 2019-07-31 (×6): 200 mg via ORAL
  Filled 2019-07-26 (×6): qty 2

## 2019-07-26 MED ORDER — SODIUM CHLORIDE 0.9 % IV SOLN: 500.0000 mL | Freq: Once | INTRAVENOUS | Status: DC | PRN

## 2019-07-26 MED ORDER — NITROGLYCERIN 0.2 MG/ML ON CALL CATH LAB
INTRAVENOUS | Status: DC | PRN
  Administered 2019-07-26: 20 ug via INTRAVENOUS

## 2019-07-26 MED ORDER — PHENYLEPHRINE HCL (PRESSORS) 10 MG/ML IV SOLN
INTRAVENOUS | Status: DC | PRN
  Administered 2019-07-26 (×2): 100 ug via INTRAVENOUS
  Administered 2019-07-26: 50 ug via INTRAVENOUS
  Administered 2019-07-26: 100 ug via INTRAVENOUS

## 2019-07-26 MED ORDER — MAGNESIUM SULFATE 2 GM/50ML IV SOLN
2.0000 g | Freq: Every day | INTRAVENOUS | Status: DC | PRN
  Filled 2019-07-26: qty 50

## 2019-07-26 MED ORDER — NITROGLYCERIN IN D5W 200-5 MCG/ML-% IV SOLN
5.0000 ug/min | INTRAVENOUS | Status: DC
  Administered 2019-07-26 (×2): 5 ug/min via INTRAVENOUS
  Administered 2019-07-26: 16:00:00 10 ug/min via INTRAVENOUS
  Filled 2019-07-26: qty 250

## 2019-07-26 MED ORDER — HEPARIN SODIUM (PORCINE) 1000 UNIT/ML IJ SOLN
INTRAMUSCULAR | Status: AC
  Filled 2019-07-26: qty 1

## 2019-07-26 MED ORDER — ACETAMINOPHEN 10 MG/ML IV SOLN
INTRAVENOUS | Status: DC | PRN
  Administered 2019-07-26: 1000 mg via INTRAVENOUS

## 2019-07-26 MED ORDER — CEFAZOLIN SODIUM-DEXTROSE 2-4 GM/100ML-% IV SOLN
2.0000 g | Freq: Three times a day (TID) | INTRAVENOUS | Status: AC
  Administered 2019-07-26 – 2019-07-27 (×2): 2 g via INTRAVENOUS
  Filled 2019-07-26 (×2): qty 100

## 2019-07-26 MED ORDER — CHLORHEXIDINE GLUCONATE CLOTH 2 % EX PADS
6.0000 | MEDICATED_PAD | Freq: Once | CUTANEOUS | Status: AC
  Administered 2019-07-26: 6 via TOPICAL

## 2019-07-26 MED ORDER — MORPHINE SULFATE (PF) 2 MG/ML IV SOLN
2.0000 mg | INTRAVENOUS | Status: DC | PRN
  Administered 2019-07-27: 4 mg via INTRAVENOUS
  Administered 2019-07-27: 05:00:00 2 mg via INTRAVENOUS
  Filled 2019-07-26: qty 2
  Filled 2019-07-26: qty 1

## 2019-07-26 MED ORDER — TIZANIDINE HCL 4 MG PO TABS
4.0000 mg | ORAL_TABLET | Freq: Two times a day (BID) | ORAL | Status: DC
  Administered 2019-07-26 – 2019-07-31 (×10): 4 mg via ORAL
  Filled 2019-07-26 (×11): qty 1

## 2019-07-26 MED ORDER — LOSARTAN POTASSIUM-HCTZ 100-25 MG PO TABS: 1.0000 | ORAL_TABLET | Freq: Every day | ORAL | Status: DC

## 2019-07-26 MED ORDER — METOPROLOL TARTRATE 5 MG/5ML IV SOLN: 2.0000 mg | INTRAVENOUS | Status: DC | PRN

## 2019-07-26 MED ORDER — ASPIRIN EC 81 MG PO TBEC
81.0000 mg | DELAYED_RELEASE_TABLET | Freq: Every day | ORAL | Status: DC
  Administered 2019-07-27 – 2019-07-31 (×5): 81 mg via ORAL
  Filled 2019-07-26 (×5): qty 1

## 2019-07-26 MED ORDER — LABETALOL HCL 5 MG/ML IV SOLN
10.0000 mg | INTRAVENOUS | Status: DC | PRN
  Administered 2019-07-28: 23:00:00 10 mg via INTRAVENOUS
  Filled 2019-07-26: qty 4

## 2019-07-26 MED ORDER — ALUM & MAG HYDROXIDE-SIMETH 200-200-20 MG/5ML PO SUSP: 15.0000 mL | ORAL | Status: DC | PRN

## 2019-07-26 MED ORDER — CETIRIZINE HCL 10 MG PO TABS
5.0000 mg | ORAL_TABLET | Freq: Every evening | ORAL | Status: DC
  Administered 2019-07-27 – 2019-07-30 (×4): 5 mg via ORAL
  Filled 2019-07-26 (×6): qty 1

## 2019-07-26 MED ORDER — ROCURONIUM BROMIDE 50 MG/5ML IV SOLN
INTRAVENOUS | Status: AC
  Filled 2019-07-26: qty 2

## 2019-07-26 MED ORDER — PREDNISONE 20 MG PO TABS
10.0000 mg | ORAL_TABLET | Freq: Every day | ORAL | Status: DC
  Administered 2019-07-27 – 2019-07-31 (×5): 10 mg via ORAL
  Filled 2019-07-26 (×5): qty 1

## 2019-07-26 MED ORDER — FAMOTIDINE IN NACL 20-0.9 MG/50ML-% IV SOLN: 20.0000 mg | Freq: Two times a day (BID) | INTRAVENOUS | Status: DC

## 2019-07-26 MED ORDER — OXYCODONE-ACETAMINOPHEN 5-325 MG PO TABS: 1.0000 | ORAL_TABLET | ORAL | Status: DC | PRN

## 2019-07-26 MED ORDER — LIDOCAINE HCL 1 % IJ SOLN
INTRAMUSCULAR | Status: DC | PRN
  Administered 2019-07-26: 9 mL

## 2019-07-26 MED ORDER — FENTANYL CITRATE (PF) 100 MCG/2ML IJ SOLN
INTRAMUSCULAR | Status: AC
  Administered 2019-07-26: 25 ug via INTRAVENOUS
  Filled 2019-07-26: qty 2

## 2019-07-26 MED ORDER — NITROGLYCERIN IN D5W 200-5 MCG/ML-% IV SOLN: 5.0000 ug/min | INTRAVENOUS | Status: DC

## 2019-07-26 SURGICAL SUPPLY — 67 items
BAG DECANTER FOR FLEXI CONT (MISCELLANEOUS) ×3 IMPLANT
BLADE SURG 15 STRL LF DISP TIS (BLADE) ×1 IMPLANT
BLADE SURG 15 STRL SS (BLADE) ×2
BLADE SURG SZ11 CARB STEEL (BLADE) ×3 IMPLANT
BOOT SUTURE AID YELLOW STND (SUTURE) ×3 IMPLANT
BRUSH SCRUB EZ  4% CHG (MISCELLANEOUS) ×2
BRUSH SCRUB EZ 4% CHG (MISCELLANEOUS) ×1 IMPLANT
CANISTER SUCT 1200ML W/VALVE (MISCELLANEOUS) ×3 IMPLANT
CHLORAPREP W/TINT 26 (MISCELLANEOUS) ×3 IMPLANT
COVER WAND RF STERILE (DRAPES) ×3 IMPLANT
DERMABOND ADVANCED (GAUZE/BANDAGES/DRESSINGS) ×2
DERMABOND ADVANCED .7 DNX12 (GAUZE/BANDAGES/DRESSINGS) ×1 IMPLANT
DRAPE 3/4 80X56 (DRAPES) ×3 IMPLANT
DRAPE INCISE IOBAN 66X45 STRL (DRAPES) ×3 IMPLANT
DRESSING SURGICEL FIBRLLR 1X2 (HEMOSTASIS) ×2 IMPLANT
DRSG SURGICEL FIBRILLAR 1X2 (HEMOSTASIS) ×6
ELECT CAUTERY BLADE 6.4 (BLADE) ×3 IMPLANT
ELECT REM PT RETURN 9FT ADLT (ELECTROSURGICAL) ×3
ELECTRODE REM PT RTRN 9FT ADLT (ELECTROSURGICAL) ×1 IMPLANT
GLOVE BIO SURGEON STRL SZ7 (GLOVE) ×9 IMPLANT
GLOVE INDICATOR 7.5 STRL GRN (GLOVE) ×3 IMPLANT
GOWN STRL REUS W/ TWL LRG LVL3 (GOWN DISPOSABLE) ×2 IMPLANT
GOWN STRL REUS W/ TWL XL LVL3 (GOWN DISPOSABLE) ×2 IMPLANT
GOWN STRL REUS W/TWL LRG LVL3 (GOWN DISPOSABLE) ×4
GOWN STRL REUS W/TWL XL LVL3 (GOWN DISPOSABLE) ×4
GRAFT PROPATEN W/RING 6X80X60 (Vascular Products) ×3 IMPLANT
HEMOSTAT SURGICEL 2X3 (HEMOSTASIS) ×3 IMPLANT
IV NS 250ML (IV SOLUTION) ×2
IV NS 250ML BAXH (IV SOLUTION) ×1 IMPLANT
KIT TURNOVER KIT A (KITS) ×3 IMPLANT
LABEL OR SOLS (LABEL) ×3 IMPLANT
LOOP RED MAXI  1X406MM (MISCELLANEOUS) ×6
LOOP VESSEL MAXI 1X406 RED (MISCELLANEOUS) ×3 IMPLANT
LOOP VESSEL MINI 0.8X406 BLUE (MISCELLANEOUS) ×2 IMPLANT
LOOPS BLUE MINI 0.8X406MM (MISCELLANEOUS) ×4
NEEDLE FILTER BLUNT 18X 1/2SAF (NEEDLE) ×2
NEEDLE FILTER BLUNT 18X1 1/2 (NEEDLE) ×1 IMPLANT
NEEDLE HYPO 25X1 1.5 SAFETY (NEEDLE) ×3 IMPLANT
NS IRRIG 1000ML POUR BTL (IV SOLUTION) ×3 IMPLANT
NS IRRIG 500ML POUR BTL (IV SOLUTION) ×3 IMPLANT
PACK BASIN MAJOR ARMC (MISCELLANEOUS) ×3 IMPLANT
PACK UNIVERSAL (MISCELLANEOUS) ×3 IMPLANT
PENCIL ELECTRO HAND CTR (MISCELLANEOUS) IMPLANT
SHUNT W TPORT 9FR PRUITT F3 (SHUNT) ×3 IMPLANT
SPONGE LAP 18X18 RF (DISPOSABLE) ×6 IMPLANT
SUCTION FRAZIER HANDLE 10FR (MISCELLANEOUS) ×2
SUCTION TUBE FRAZIER 10FR DISP (MISCELLANEOUS) ×1 IMPLANT
SUT GORETEX CV-6TTC-13 36IN (SUTURE) ×6 IMPLANT
SUT GTX CV-5 TTC13 DBL (SUTURE) IMPLANT
SUT MNCRL 4-0 (SUTURE) ×4
SUT MNCRL 4-0 27XMFL (SUTURE) ×2
SUT PROLENE 6 0 BV (SUTURE) ×18 IMPLANT
SUT PROLENE 7 0 BV 1 (SUTURE) ×12 IMPLANT
SUT SILK 2 0 (SUTURE) ×2
SUT SILK 2-0 18XBRD TIE 12 (SUTURE) ×1 IMPLANT
SUT SILK 3 0 (SUTURE) ×2
SUT SILK 3-0 18XBRD TIE 12 (SUTURE) ×1 IMPLANT
SUT SILK 4 0 (SUTURE) ×2
SUT SILK 4-0 18XBRD TIE 12 (SUTURE) ×1 IMPLANT
SUT VIC AB 3-0 SH 27 (SUTURE) ×8
SUT VIC AB 3-0 SH 27X BRD (SUTURE) ×4 IMPLANT
SUTURE MNCRL 4-0 27XMF (SUTURE) ×2 IMPLANT
SYR 10ML LL (SYRINGE) ×6 IMPLANT
SYR 20ML LL LF (SYRINGE) ×3 IMPLANT
TRAY FOLEY MTR SLVR 16FR STAT (SET/KITS/TRAYS/PACK) ×3 IMPLANT
TUBING CONNECTING 10 (TUBING) IMPLANT
TUBING CONNECTING 10' (TUBING)

## 2019-07-26 NOTE — Op Note (Signed)
[facility]   OPERATIVE NOTE  PROCEDURE:   1.  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  none  SPECIMEN(S):  none  INDICATIONS:   Martha Peterson is a 79 y.o. 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  I discussed with the patient the risks, benefits, and alternatives to carotid  To carotid bypass.  I discussed the differences between carotid stenting and carotid endarterectomy and bypass. I discussed the procedural details of carotid to carotid bypass with the patient.  The patient is aware that the risks of surgery include but are not limited to: bleeding, infection, stroke, myocardial infarction, death, cranial nerve injuries both temporary and permanent, neck hematoma, possible airway compromise, labile blood pressure post-operatively, cerebral hyperperfusion syndrome, and possible need for additional interventions in the future. The patient is aware of the risks and agrees to proceed forward with the procedure.  DESCRIPTION: After full informed written consent was obtained from the patient, the patient was brought back to the operating room and placed supine upon the operating table.  Prior to induction, the patient received IV antibiotics.  After obtaining adequate anesthesia, the patient was placed into a modified beach chair position with a shoulder roll in place and the patient's neck slightly hyperextended.  The patient was prepped in the standard fashion for a carotid exposures bilaterally.  I made an incision anterior to the sternocleidomastoid muscle and dissected down through the subcutaneous tissue on the left.  Dr. Delana Meyer did  similarly on the right.  The platysmas was opened with electrocautery.  Then I dissected down to the internal jugular vein and the common carotid artery on the left.  Dr. Delana Meyer did similarly on the right.  Care was taken to avoid the vagus nerve which was identified on each side.  Vesseloops were used to control the common carotid artery proximally and distally on both sides and we prepared for tunneling.  Tunneling was performed immediately above the cervical spine taking care to stay below the esophagus in the retropharyngeal space.  Umbilical tape was brought through the tunnel and the patient was given 5000 units of intravenous heparin.  After the heparin circulated for approximately 5 minutes, control was pulled up on the left common carotid artery proximally and distally an 11 blade was used to create an anterior wall arteriotomy.  The Gilmer Mor shunt was then placed proximally and distally impression flow was restored to the brain to allow inline flow during the procedure.  A 6 mm propatent PTFE ringed graft was then brought onto the field and brought through the tunnel keeping the orientation.  It was then cut and beveled to an appropriate length to match the arteriotomy and an anastomosis created with a CV 6 suture in the usual fashion.  The shunt had to be removed before the final few stitches and once the anastomosis was completed we flushed through the graft and then clamped the graft.  Pressure was held on the site while we turned our attention to the right neck.  Control was then pulled up on the right carotid artery with the Vesseloops on the right common carotid artery.  An arteriotomy is created with  11 blade and extended with Potts scissors in the right common carotid artery.  The PTFE graft was then cut and beveled at appropriate length to match the arteriotomy in the right common carotid artery.  Anastomosis then created with a running CV 6 suture in the usual fashion with flushing and  de-airing prior to release of control.  On release control, there was an excellent pulse within the graft as well as within the carotid artery distal to the graft on the right and the carotid artery distal to the graft on the left.  The more proximal right common carotid artery prior to the bypass was then ligated to avoid further embolization from the innominate artery.  Several minutes of pressure were held and 6-0 Prolene patch sutures were used as need for hemostasis.  At this point, I placed Fibrillar and Vistacel topical hemostatic agents.  There was no more active bleeding in the surgical site.  The sternocleidomastoid space was closed with three interrupted 3-0 Vicryl sutures. I then reapproximated the platysma muscle with a running stitch of 3-0 Vicryl.  The skin was then closed with a running subcuticular 4-0 Monocryl.  The skin was then cleaned, dried and Dermabond was used to reinforce the skin closure.  The patient awakened and was taken to the recovery room in stable condition, following commands and moving all four extremities without any apparent deficits.    COMPLICATIONS: none  CONDITION: stable  Leotis Pain  07/26/2019, 1:39 PM    This note was created with Dragon Medical transcription system. Any errors in dictation are purely unintentional.

## 2019-07-26 NOTE — Transfer of Care (Signed)
Immediate Anesthesia Transfer of Care Note  Patient: Martha Peterson  Procedure(s) Performed: BYPASS GRAFT CAROTID-SUBCLAVIAN ( CAROTID TO CAROTID BYPASS) (N/A )  Patient Location: PACU  Anesthesia Type:General  Level of Consciousness: awake  Airway & Oxygen Therapy: Patient connected to face mask oxygen  Post-op Assessment: Post -op Vital signs reviewed and stable  Post vital signs: stable  Last Vitals:  Vitals Value Taken Time  BP 109/45 07/26/19 1346  Temp    Pulse 64 07/26/19 1349  Resp 14 07/26/19 1349  SpO2 100 % 07/26/19 1349  Vitals shown include unvalidated device data.  Last Pain:  Vitals:   07/26/19 0905  TempSrc: Oral  PainSc: 0-No pain         Complications: No apparent anesthesia complications

## 2019-07-26 NOTE — Anesthesia Procedure Notes (Signed)
Arterial Line Insertion Start/End2/03/2020 10:30 AM, 07/26/2019 10:39 AM Performed by: Emmie Niemann, MD, anesthesiologist  Preanesthetic checklist: patient identified, IV checked, site marked, risks and benefits discussed, surgical consent, monitors and equipment checked, pre-op evaluation, timeout performed and anesthesia consent Left, radial was placed Catheter size: 20 G Hand hygiene performed  and Seldinger technique used Allen's test indicative of satisfactory collateral circulation Attempts: 2 Procedure performed without using ultrasound guided technique. Following insertion, dressing applied and Biopatch. Post procedure assessment: normal  Patient tolerated the procedure well with no immediate complications.

## 2019-07-26 NOTE — Transfer of Care (Deleted)
Immediate Anesthesia Transfer of Care Note  Patient: Martha Peterson  Procedure(s) Performed: BYPASS GRAFT CAROTID-SUBCLAVIAN ( CAROTID TO CAROTID BYPASS) (N/A )  Patient Location: PACU  Anesthesia Type:General  Level of Consciousness: awake  Airway & Oxygen Therapy: Patient connected to face mask oxygen  Post-op Assessment: Post -op Vital signs reviewed and stable  Post vital signs: stable  Last Vitals:  Vitals Value Taken Time  BP 155/56   Temp 97.2   Pulse 69   Resp 26   SpO2 99%     Last Pain:  Vitals:   07/26/19 0905  TempSrc: Oral  PainSc: 0-No pain         Complications: No apparent anesthesia complications

## 2019-07-26 NOTE — H&P (Signed)
[facility] History & Physical Update  The patient was interviewed and re-examined.  The patient's previous History and Physical has been reviewed and is unchanged.  There is no change in the plan of care. We plan to proceed with the scheduled procedure.  Leotis Pain, MD  07/26/2019, 9:59 AM

## 2019-07-26 NOTE — Anesthesia Preprocedure Evaluation (Signed)
Anesthesia Evaluation  Patient identified by MRN, date of birth, ID band Patient awake    Reviewed: Allergy & Precautions, NPO status , Patient's Chart, lab work & pertinent test results  History of Anesthesia Complications Negative for: history of anesthetic complications  Airway Mallampati: II  TM Distance: >3 FB Neck ROM: Full    Dental  (+) Edentulous Upper, Edentulous Lower   Pulmonary asthma , COPD, former smoker,    breath sounds clear to auscultation- rhonchi (-) wheezing      Cardiovascular hypertension, Pt. on medications (-) CAD, (-) Past MI, (-) Cardiac Stents and (-) CABG  Rhythm:Regular Rate:Normal - Systolic murmurs and - Diastolic murmurs    Neuro/Psych neg Seizures negative neurological ROS  negative psych ROS   GI/Hepatic negative GI ROS, Neg liver ROS,   Endo/Other  neg diabetesHypothyroidism   Renal/GU negative Renal ROS     Musculoskeletal  (+) Arthritis ,   Abdominal (+) + obese,   Peds  Hematology negative hematology ROS (+)   Anesthesia Other Findings Past Medical History: No date: Arthritis No date: Asthma No date: COPD (chronic obstructive pulmonary disease) (HCC) No date: Diverticulitis No date: Gout No date: Hyperlipidemia No date: Hypertension No date: Hypothyroidism 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) No date: Microhematuria No date: Mild mitral regurgitation No date: Mild tricuspid regurgitation No date: Osteopenia No date: Pulmonary fibrosis (HCC) No date: Urinary incontinence   Reproductive/Obstetrics                             Anesthesia Physical Anesthesia Plan  ASA: III  Anesthesia Plan: General   Post-op Pain Management:    Induction: Intravenous  PONV Risk Score and Plan: 2 and Ondansetron and Dexamethasone  Airway Management Planned: Oral ETT  Additional Equipment: Arterial line  Intra-op Plan:    Post-operative Plan: Extubation in OR  Informed Consent: I have reviewed the patients History and Physical, chart, labs and discussed the procedure including the risks, benefits and alternatives for the proposed anesthesia with the patient or authorized representative who has indicated his/her understanding and acceptance.     Dental advisory given  Plan Discussed with: CRNA and Anesthesiologist  Anesthesia Plan Comments:         Anesthesia Quick Evaluation

## 2019-07-26 NOTE — Anesthesia Postprocedure Evaluation (Signed)
Anesthesia Post Note  Patient: Martha Peterson  Procedure(s) Performed: BYPASS GRAFT CAROTID-SUBCLAVIAN ( CAROTID TO CAROTID BYPASS) (N/A )  Patient location during evaluation: PACU Anesthesia Type: General Level of consciousness: awake and alert and oriented Pain management: pain level controlled Vital Signs Assessment: post-procedure vital signs reviewed and stable Respiratory status: spontaneous breathing, nonlabored ventilation and respiratory function stable Cardiovascular status: blood pressure returned to baseline and stable Postop Assessment: no signs of nausea or vomiting Anesthetic complications: no     Last Vitals:  Vitals:   07/26/19 1411 07/26/19 1415  BP: 135/65   Pulse: 64 66  Resp: 10 14  Temp:    SpO2: 100% 100%    Last Pain:  Vitals:   07/26/19 1411  TempSrc:   PainSc: (P) 5                  Adellyn Capek

## 2019-07-26 NOTE — Anesthesia Procedure Notes (Signed)
Procedure Name: Intubation Date/Time: 07/26/2019 10:28 AM Performed by: Aline Brochure, CRNA Pre-anesthesia Checklist: Patient identified, Patient being monitored, Timeout performed, Emergency Drugs available and Suction available Patient Re-evaluated:Patient Re-evaluated prior to induction Oxygen Delivery Method: Circle system utilized Preoxygenation: Pre-oxygenation with 100% oxygen Induction Type: IV induction Ventilation: Mask ventilation without difficulty Laryngoscope Size: Miller and 2 Grade View: Grade I Tube type: Oral Tube size: 7.0 mm Number of attempts: 1 Airway Equipment and Method: Stylet Placement Confirmation: ETT inserted through vocal cords under direct vision,  positive ETCO2 and breath sounds checked- equal and bilateral Secured at: 21 cm Tube secured with: Tape Dental Injury: Teeth and Oropharynx as per pre-operative assessment

## 2019-07-26 NOTE — Op Note (Signed)
VEIN AND VASCULAR SURGERY   OPERATIVE NOTE  PROCEDURE:   1.  Carotid artery to carotid bypass graft using 6 mm ringed PTFE  PRE-OPERATIVE DIAGNOSIS: 1.  Symptomatic occlusion of the innominate 2.  Multiple TIAs with visual symptoms.  POST-OPERATIVE DIAGNOSIS: same as above   Co-SURGEON: Katha Cabal, MD and Algernon Huxley MD  ANESTHESIA: general  ESTIMATED BLOOD LOSS: 75 cc  FINDING(S): 1.  None  SPECIMEN(S):  Carotid plaque (sent to Pathology)  INDICATIONS:   Martha Peterson is a 79 y.o. y.o. female who presents with multiple episodes of visual changes.  Work-up including CT scan as well as angiography suggests the anatomy occlusion is the source.  She is therefore undergoing carotid to carotid bypass from left to right with ligation of the proximal right common carotid to prevent further neurologic deficits and strokes.  The risks, benefits, and alternatives to bypass were discussed with the patient. The patient voiced understanding and appears to be aware that the risks of carotid surgery include but are not limited to: bleeding, infection, stroke, myocardial infarction, death, cranial nerve injuries both temporary and permanent, neck hematoma, possible airway compromise, labile blood pressure post-operatively, cerebral hyperperfusion syndrome, and possible need for additional interventions in the future. The patient is aware of the risks and agrees to proceed forward with the procedure.  DESCRIPTION: After full informed written consent was obtained from the patient, the patient was brought back to the operating room and placed supine upon the operating table.  Prior to induction, the patient received IV antibiotics.  After obtaining adequate anesthesia, the patient was placed a supine position with a shoulder roll in place and the patient's neck slightly hyperextended and rotated away from the surgical site.  The patient was prepped in the standard fashion for a carotid  carotid bypass.Marland Kitchen    Co-surgeons are required because this is a bilateral procedure with work being performed simultaneously from both the right and left approach.  Cosurgeons working together also expedite the procedure making a shorter operative time reducing complications and improving patient safety.  The skin incision was made in the bilateral drainage neck anterior to the sternocleidomastoid muscle and dissected down through the subcutaneous tissue.  The platysmas was opened with electrocautery.  The internal jugular vein is reflected laterally.  Crossing veins are ligated and divided between 2-0 silk ties.  The omohyoid was identified and the common carotid artery is exposed at this level. The dissection was there in carried out along the carotid artery in a cranial direction taking care to identify the vagus nerve and leave it undisturbed.  The dissection was then carried along periadventitial plane along the common carotid artery toward the bifurcation.  Next the tissues just anterior to the spine on incised with Bovie cautery and a tunnel created using blunt dissection present in the retropharyngeal space staying immediately anterior to the spine and reflecting the trachea and the esophagus anterior.  Umbilical tape was then pulled through the tunnel.  Once the common carotid arteries were exposed bilaterally, we gave the patient 5000 units of intravenous heparin and this was allowed to circulate for several minutes.    The common carotid artery on the left was addressed first as this was the inflow.  Common carotid on the left was controlled proximally distally with Silastic Vesseloops.  Arteriotomy made with 11 blade and extended with Potts scissors.  A shunt was then placed without difficulty.  The 6 mm ringed PTFE propatent graft was then  pulled through the tunnel.  The left side was beveled to match the arteriotomy.  Angiogram to side left common carotid artery anastomosis was then  fashioned using running CV 6 suture.  The shunt was removed just prior to completing the suture line.  Flushing millimeters were performed and the suture line was completed.  The system was then flushed through the graft and then flow was reestablished to the left internal and external carotid arteries.  The radiograph was clamped just above the suture line and then irrigated copiously with heparinized saline.  In a similar fashion the right common carotid artery was controlled using the Silastic Vesseloops proximally and distally.  The graft was then approximated to the carotid artery and beveled in an appropriate fashion.  Arteriotomy was then made with 11 blade scalpel stented with Potts scissors and an end graft to side common carotid artery anastomosis was fashioned using running CV 6 suture.  Flushing maneuvers were performed before completing the suture line and subsequently flow was reestablished to the right internal and external carotid arteries.  Both wounds were then packed with 4 x 4 gauze lap pads as well as fibrillar along the suture line.  Several minutes of pressure were held and 6-0 Prolene sutures were used as need for hemostasis.  At this point, I placed Surgicel and Vistaseal topical hemostatic agents.  There was no more active bleeding in the surgical site.  The sternocleidomastoid space was closed with three interrupted 3-0 Vicryl sutures. I then reapproximated the platysma muscle with a running stitch of 3-0 Vicryl.  The skin was then closed with a running subcuticular 4-0 Monocryl.  The skin was then cleaned, dried and Dermabond was used to reinforce the skin closure.  The patient awakened and was taken to the recovery room in stable condition, following commands and moving all four extremities without any apparent deficits.    COMPLICATIONS: none  CONDITION: stable  Hortencia Pilar 07/26/2019<3:22 PM

## 2019-07-27 LAB — BASIC METABOLIC PANEL
Anion gap: 6 (ref 5–15)
BUN: 21 mg/dL (ref 8–23)
CO2: 33 mmol/L — ABNORMAL HIGH (ref 22–32)
Calcium: 8.5 mg/dL — ABNORMAL LOW (ref 8.9–10.3)
Chloride: 99 mmol/L (ref 98–111)
Creatinine, Ser: 1.03 mg/dL — ABNORMAL HIGH (ref 0.44–1.00)
GFR calc Af Amer: 59 mL/min — ABNORMAL LOW (ref >60)
GFR calc non Af Amer: 51 mL/min — ABNORMAL LOW (ref >60)
Glucose, Bld: 119 mg/dL — ABNORMAL HIGH (ref 70–99)
Potassium: 3.2 mmol/L — ABNORMAL LOW (ref 3.5–5.1)
Sodium: 138 mmol/L (ref 135–145)

## 2019-07-27 LAB — CBC
HCT: 28.2 % — ABNORMAL LOW (ref 36.0–46.0)
Hemoglobin: 9.5 g/dL — ABNORMAL LOW (ref 12.0–15.0)
MCH: 33 pg (ref 26.0–34.0)
MCHC: 33.7 g/dL (ref 30.0–36.0)
MCV: 97.9 fL (ref 80.0–100.0)
Platelets: 127 10*3/uL — ABNORMAL LOW (ref 150–400)
RBC: 2.88 MIL/uL — ABNORMAL LOW (ref 3.87–5.11)
RDW: 12.7 % (ref 11.5–15.5)
WBC: 6.9 10*3/uL (ref 4.0–10.5)
nRBC: 0 % (ref 0.0–0.2)

## 2019-07-27 MED ORDER — POTASSIUM CHLORIDE 10 MEQ/100ML IV SOLN
10.0000 meq | INTRAVENOUS | Status: AC
  Administered 2019-07-27 (×4): 10 meq via INTRAVENOUS
  Filled 2019-07-27 (×4): qty 100

## 2019-07-27 NOTE — Progress Notes (Signed)
[facility]  Daily Progress Note   Subjective  -   Still with some sore throat and subjective difficulty swallowing.  Neck swelling remains mild to moderate and has not changed since last night.  Objective Vitals:   07/27/19 0800 07/27/19 0804 07/27/19 0815 07/27/19 0900  BP: (!) 134/57 (!) 163/47 (!) 153/64 (!) 127/52  Pulse: 75  78 80  Resp: 14  18 14   Temp: 98.2 F (36.8 C)     TempSrc: Oral     SpO2: 100%  100% 97%  Weight:      Height:        Intake/Output Summary (Last 24 hours) at 07/27/2019 0946 Last data filed at 07/27/2019 0600 Gross per 24 hour  Intake 2330.59 ml  Output 1250 ml  Net 1080.59 ml    PULM  CTAB CV  RRR VASC  neck swelling is mild to moderate has not changed since last night.  Incisions are intact.  No neurologic deficits.  Laboratory CBC    Component Value Date/Time   WBC 6.9 07/27/2019 0432   HGB 9.5 (L) 07/27/2019 0432   HGB 12.8 02/07/2019 1121   HCT 28.2 (L) 07/27/2019 0432   HCT 37.3 02/07/2019 1121   PLT 127 (L) 07/27/2019 0432   PLT 144 (L) 02/07/2019 1121    BMET    Component Value Date/Time   NA 138 07/27/2019 0432   NA 139 02/07/2019 1121   NA 135 (L) 02/02/2013 1038   K 3.2 (L) 07/27/2019 0432   K 3.8 02/02/2013 1038   CL 99 07/27/2019 0432   CL 102 02/02/2013 1038   CO2 33 (H) 07/27/2019 0432   CO2 29 02/02/2013 1038   GLUCOSE 119 (H) 07/27/2019 0432   GLUCOSE 111 (H) 02/02/2013 1038   BUN 21 07/27/2019 0432   BUN 20 02/07/2019 1121   BUN 15 02/02/2013 1038   CREATININE 1.03 (H) 07/27/2019 0432   CREATININE 0.99 02/20/2014 1019   CALCIUM 8.5 (L) 07/27/2019 0432   CALCIUM 8.9 02/20/2014 1019   GFRNONAA 51 (L) 07/27/2019 0432   GFRNONAA 56 (L) 02/20/2014 1019   GFRAA 59 (L) 07/27/2019 0432   GFRAA >60 02/20/2014 1019    Assessment/Planning: POD #1 s/p left to right carotid to carotid bypass   Overall doing okay.  Does have some swelling in the neck so we will keep her on clear  liquids only and keep her head elevated  On low-dose nitroglycerin to keep her blood pressure in the normal range to avoid further bleeding/oozing.  Plan to remain in the hospital at least 1 more day.    Leotis Pain  07/27/2019, 9:46 AM

## 2019-07-28 DIAGNOSIS — I771 Stricture of artery: Secondary | ICD-10-CM

## 2019-07-28 LAB — BASIC METABOLIC PANEL
Anion gap: 8 (ref 5–15)
BUN: 17 mg/dL (ref 8–23)
CO2: 30 mmol/L (ref 22–32)
Calcium: 8.3 mg/dL — ABNORMAL LOW (ref 8.9–10.3)
Chloride: 98 mmol/L (ref 98–111)
Creatinine, Ser: 0.81 mg/dL (ref 0.44–1.00)
GFR calc Af Amer: >60 mL/min (ref >60)
GFR calc non Af Amer: >60 mL/min (ref >60)
Glucose, Bld: 115 mg/dL — ABNORMAL HIGH (ref 70–99)
Potassium: 3.1 mmol/L — ABNORMAL LOW (ref 3.5–5.1)
Sodium: 136 mmol/L (ref 135–145)

## 2019-07-28 LAB — CBC
HCT: 27.9 % — ABNORMAL LOW (ref 36.0–46.0)
Hemoglobin: 9.2 g/dL — ABNORMAL LOW (ref 12.0–15.0)
MCH: 32.4 pg (ref 26.0–34.0)
MCHC: 33 g/dL (ref 30.0–36.0)
MCV: 98.2 fL (ref 80.0–100.0)
Platelets: 99 10*3/uL — ABNORMAL LOW (ref 150–400)
RBC: 2.84 MIL/uL — ABNORMAL LOW (ref 3.87–5.11)
RDW: 12.6 % (ref 11.5–15.5)
WBC: 4.2 10*3/uL (ref 4.0–10.5)
nRBC: 0 % (ref 0.0–0.2)

## 2019-07-28 LAB — MAGNESIUM: Magnesium: 1.4 mg/dL — ABNORMAL LOW (ref 1.7–2.4)

## 2019-07-28 MED ORDER — MAGNESIUM SULFATE 4 GM/100ML IV SOLN
4.0000 g | Freq: Once | INTRAVENOUS | Status: AC
  Administered 2019-07-28: 4 g via INTRAVENOUS
  Filled 2019-07-28: qty 100

## 2019-07-28 MED ORDER — POTASSIUM CHLORIDE CRYS ER 20 MEQ PO TBCR
40.0000 meq | EXTENDED_RELEASE_TABLET | Freq: Once | ORAL | Status: AC
  Administered 2019-07-28: 09:00:00 40 meq via ORAL
  Filled 2019-07-28: qty 2

## 2019-07-28 MED ORDER — CHLORHEXIDINE GLUCONATE CLOTH 2 % EX PADS
6.0000 | MEDICATED_PAD | Freq: Every day | CUTANEOUS | Status: DC
  Administered 2019-07-29: 6 via TOPICAL

## 2019-07-28 NOTE — Evaluation (Signed)
Occupational Therapy Evaluation Patient Details Name: Martha Peterson MRN: [id number] DOB: 1940-08-06 Today's Date: 07/28/2019    History of Present Illness Pt is pleasant 79 y/o female with PMH: COPD, mild mitral regurgitation, mild tricuspid regurgitation, pulmonary fibrosis, gout, diverticulitis, and innominate artery stenosis, now s/p left to right carotid to carotid bypass with 48mm PTFE - POD#2   Clinical Impression   Pt was seen for OT evaluation this date. Prior to hospital admission, pt was Indep with self care and IADLs as well as caring for her husband who has cancer and uses a walker. Pt lives in [facility] with level entry. Currently pt demonstrates impairments as described below (See OT problem list) which functionally limit her ability to perform ADL/self-care tasks. Pt currently requires MIN A with ADL transfers and MIN/MOD A with LB ADLs and toileting. All assistance during OT session is primarily related to line/lead mgt.  Pt would benefit from skilled OT to address noted impairments and functional limitations (see below for any additional details) in order to maximize safety and independence while minimizing falls risk and caregiver burden. Upon hospital discharge, recommend HHOT to maximize pt safety and return to functional independence during meaningful occupations of daily life.     Follow Up Recommendations  Home health OT    Equipment Recommendations  None recommended by OT    Recommendations for Other Services       Precautions / Restrictions Precautions Precautions: Fall;Other (comment) Precaution Comments: monitor arterial BP, monitor positionoing of art line transducer with mobilization. Restrictions Weight Bearing Restrictions: No      Mobility Bed Mobility Overal bed mobility: Needs Assistance Bed Mobility: Supine to Sit     Supine to sit: +2 for safety/equipment;Supervision Sit to supine: Min guard;+2 for safety/equipment   General bed mobility  comments: Pt did not require physical assistance but required extra time and +2 management of lines/leads  Transfers Overall transfer level: Needs assistance Equipment used: Rolling walker (2 wheeled) Transfers: Sit to/from Stand Sit to Stand: +2 safety/equipment;Min assist         General transfer comment: Pt provided with verbal cueing to not use L hand to push off from bed due to arterial line    Balance Overall balance assessment: Needs assistance Sitting-balance support: Feet supported;Single extremity supported Sitting balance-Leahy Scale: Good     Standing balance support: Bilateral upper extremity supported;During functional activity Standing balance-Leahy Scale: Good                             ADL either performed or assessed with clinical judgement   ADL                                         General ADL Comments: Pt requires setup for seated UB ADLs, MIN/MOD A for LB ADLs seated and MIN A +2 for ADL transfers with RW to pivot to [facility]. Most assistance required is secondary to line mgt.     Vision Baseline Vision/History: Wears glasses Wears Glasses: At all times Patient Visual Report: No change from baseline Additional Comments: per MD note prior to hospitalization, pt was notified by eye doctor of plaque going to her R eye. Pt reports no new vision concerns and pt tracks appropraitely throughout.     Perception     Praxis      Pertinent Vitals/Pain Pain  Assessment: No/denies pain Faces Pain Scale: Hurts little more Pain Location: some swelling/brusing to neck that pt describes as uncomfortable Pain Descriptors / Indicators: Discomfort Pain Intervention(s): Monitored during session     Hand Dominance     Extremity/Trunk Assessment Upper Extremity Assessment Upper Extremity Assessment: RUE deficits/detail;LUE deficits/detail RUE Deficits / Details: shld flex 3-/5 (completes 1/2 arc motion), reports pain from previous artery  procedure, elbow & grip 4-/5 LUE Deficits / Details: shld, elbow and grip 4-/5   Lower Extremity Assessment Lower Extremity Assessment: Generalized weakness   Cervical / Trunk Assessment Cervical / Trunk Assessment: Other exceptions   Communication Communication Communication: No difficulties   Cognition Arousal/Alertness: Awake/alert Behavior During Therapy: WFL for tasks assessed/performed Overall Cognitive Status: Within Functional Limits for tasks assessed                                 General Comments: pt is A&O   General Comments       Exercises Other Exercises Other Exercises: OT facilitates education re: role of OT in acute setting as well as safety with mobilization in acute setting. Pt with good understanding.    Shoulder Instructions      Home Living Family/patient expects to be discharged to:: Private residence Living Arrangements: Spouse/significant other Available Help at Discharge: Family;Available PRN/intermittently Type of Home: House Home Access: Stairs to enter [employer] of Steps: 1 Entrance Stairs-Rails: None Home Layout: Two level;Full bath on main level;Able to live on main level with bedroom/bathroom     Bathroom Shower/Tub: Walk-in shower   Bathroom Toilet: Handicapped height     Home Equipment: Environmental consultant - 2 wheels;Cane - quad;Grab bars - tub/shower;Walker - 4 wheels;Tub bench;Grab bars - toilet          Prior Functioning/Environment Level of Independence: Independent with assistive device(s)        Comments: pt reports using QC for taking her dogs on walks, but not in the home, reports that most equipment in the home is for her husband who has cancer and uses RW. She reports that she's been acting primarily as his caregiver including driving, getting groceries and completing all household IADLs.        OT Problem List: Decreased strength;Decreased range of motion;Decreased activity tolerance;Impaired balance  (sitting and/or standing)      OT Treatment/Interventions: Self-care/ADL training;Therapeutic exercise;Energy conservation;DME and/or AE instruction;Therapeutic activities;Patient/family education;Balance training    OT Goals(Current goals can be found in the care plan section) Acute Rehab OT Goals Patient Stated Goal: get strength back OT Goal Formulation: With patient Time For Goal Achievement: 08/11/19 Potential to Achieve Goals: Good  OT Frequency: Min 2X/week   Barriers to D/C:            Co-evaluation              AM-PAC OT "6 Clicks" Daily Activity     Outcome Measure Help from another person eating meals?: None Help from another person taking care of personal grooming?: A Little Help from another person toileting, which includes using toliet, bedpan, or urinal?: A Little Help from another person bathing (including washing, rinsing, drying)?: A Little Help from another person to put on and taking off regular upper body clothing?: None Help from another person to put on and taking off regular lower body clothing?: A Little 6 Click Score: 20   End of Session Equipment Utilized During Treatment: Gait belt Nurse Communication: Mobility status  Activity Tolerance: Patient tolerated treatment well Patient left: in bed;with call bell/phone within reach  OT Visit Diagnosis: Unsteadiness on feet (R26.81);Muscle weakness (generalized) (M62.81)                Time: [id number] OT Time Calculation (min): 44 min Charges:  OT General Charges $OT Visit: 1 Visit OT Evaluation $OT Eval Moderate Complexity: 1 Mod OT Treatments $Self Care/Home Management : 8-22 mins $Therapeutic Activity: 8-22 mins  Gerrianne Scale, MS, OTR/L ascom [phone] 07/28/19, 4:38 PM

## 2019-07-28 NOTE — Progress Notes (Signed)
[facility] Daily Progress Note   Subjective: 07/26/19: 1.  Left to right carotid to carotid bypass with 6 mm PTFE  Patient with some continued "soreness" to incisions. Episode of asymptomatic hypotension around midnight, again this AM.   Objective: Vitals:   07/28/19 0700 07/28/19 0800 07/28/19 0900 07/28/19 1000  BP: (!) 128/53 126/71 (!) 152/72 (!) 80/50  Pulse: 66 65 68 65  Resp: 15 14 18 14   Temp:  98 F (36.7 C)    TempSrc:  Oral    SpO2: 100% 99% 100% 99%  Weight:      Height:        Intake/Output Summary (Last 24 hours) at 07/28/2019 1220 Last data filed at 07/28/2019 0700 Gross per 24 hour  Intake 1710.06 ml  Output 1100 ml  Net 610.06 ml   Physical Exam: A&Ox3, NAD Neck:  Improved swelling. Incisions: clean, dry and intact.  CV: RRR Pulmonary: CTA Bilaterally Abdomen: Soft, Nontender, Nondistended Vascular:  Upper Extremity: Warm distally to fingers. Palpable pulses.    Laboratory: CBC    Component Value Date/Time   WBC 4.2 07/28/2019 0449   HGB 9.2 (L) 07/28/2019 0449   HGB 12.8 02/07/2019 1121   HCT 27.9 (L) 07/28/2019 0449   HCT 37.3 02/07/2019 1121   PLT 99 (L) 07/28/2019 0449   PLT 144 (L) 02/07/2019 1121   BMET    Component Value Date/Time   NA 136 07/28/2019 0449   NA 139 02/07/2019 1121   NA 135 (L) 02/02/2013 1038   K 3.1 (L) 07/28/2019 0449   K 3.8 02/02/2013 1038   CL 98 07/28/2019 0449   CL 102 02/02/2013 1038   CO2 30 07/28/2019 0449   CO2 29 02/02/2013 1038   GLUCOSE 115 (H) 07/28/2019 0449   GLUCOSE 111 (H) 02/02/2013 1038   BUN 17 07/28/2019 0449   BUN 20 02/07/2019 1121   BUN 15 02/02/2013 1038   CREATININE 0.81 07/28/2019 0449   CREATININE 0.99 02/20/2014 1019   CALCIUM 8.3 (L) 07/28/2019 0449   CALCIUM 8.9 02/20/2014 1019   GFRNONAA >60 07/28/2019 0449   GFRNONAA 56 (L) 02/20/2014 1019   GFRAA >60 07/28/2019 0449   GFRAA >60 02/20/2014 1019   Assessment/Planning: The patient is a 79 year old  female s/p left to right carotid to carotid bypass with 45mm PTFE - POD#2 1) Advanced diet to soft today. Patient seems to be tolerating lunch. 2) Two episodes of asymptomatic hypotension - may need to adjust antihypertensive medications. 3) H&H stable 4) Renal function stable 5) PT / OT / OOB 6) Possible d/c home tomorrow  Seen and examined with Dr. Ellis [facility] PA-C 07/28/2019 12:20 PM

## 2019-07-28 NOTE — Progress Notes (Signed)
   07/28/19 1215  Clinical Encounter Type  Visited With Patient  Visit Type Initial  Referral From Chaplain  Consult/Referral To Chaplain  Chaplain followed up on patient per request of [employer]. Patient was sitting up getting ready to eat lunch. She asked Chaplain to cut her food. Chaplain cut patient's food and told her she would be back to check on her later.

## 2019-07-28 NOTE — Progress Notes (Signed)
[place] visited pt. while rounding on ICU; pt. awake, sitting in chair beside bed; son sitting on bed talking w/pt.  Pt. shared she underwent corotid artery bypass surgery to treat blockages threatening to cut off blood supply to the brain; pt. appears to be recovering well; reported soreness in her neck.  Pt. shared she has a beagle at home; pt. lives w/her husband; husband has just been put on hospice care and seems to be dealing with multiple cancer diagnoses, acc. to pt.'s report.  Pt.'s dtr. has moved in to help w/ husband's care; dtr, is RN at [employer] cancer center and is significant support person for family in making decisions.  Dtr. arrived towards end of visit; [place] excused himself to give family time to talk.  No needs expressed at this time, but [place] availability made known.          07/27/19 1430  Clinical Encounter Type  Visited With Patient and family together  Visit Type Initial;Social support;Psychological support;Critical Care  Consult/Referral To Chaplain  Spiritual Encounters  Spiritual Needs Emotional;Grief support  Stress Factors  Patient Stress Factors Health changes;Family relationships;Loss of control;Major life changes;Loss  Family Stress Factors Loss;Loss of control;Major life changes;Health changes;Family relationships
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
## 2019-07-29 LAB — CBC
HCT: 28 % — ABNORMAL LOW (ref 36.0–46.0)
Hemoglobin: 9.4 g/dL — ABNORMAL LOW (ref 12.0–15.0)
MCH: 32.6 pg (ref 26.0–34.0)
MCHC: 33.6 g/dL (ref 30.0–36.0)
MCV: 97.2 fL (ref 80.0–100.0)
Platelets: 110 10*3/uL — ABNORMAL LOW (ref 150–400)
RBC: 2.88 MIL/uL — ABNORMAL LOW (ref 3.87–5.11)
RDW: 12.5 % (ref 11.5–15.5)
WBC: 6.1 10*3/uL (ref 4.0–10.5)
nRBC: 0 % (ref 0.0–0.2)

## 2019-07-29 LAB — BASIC METABOLIC PANEL
Anion gap: 6 (ref 5–15)
BUN: 16 mg/dL (ref 8–23)
CO2: 31 mmol/L (ref 22–32)
Calcium: 8.3 mg/dL — ABNORMAL LOW (ref 8.9–10.3)
Chloride: 98 mmol/L (ref 98–111)
Creatinine, Ser: 0.71 mg/dL (ref 0.44–1.00)
GFR calc Af Amer: >60 mL/min (ref >60)
GFR calc non Af Amer: >60 mL/min (ref >60)
Glucose, Bld: 109 mg/dL — ABNORMAL HIGH (ref 70–99)
Potassium: 3.1 mmol/L — ABNORMAL LOW (ref 3.5–5.1)
Sodium: 135 mmol/L (ref 135–145)

## 2019-07-29 LAB — MAGNESIUM: Magnesium: 1.9 mg/dL (ref 1.7–2.4)

## 2019-07-29 MED ORDER — POTASSIUM CHLORIDE 10 MEQ/100ML IV SOLN
10.0000 meq | INTRAVENOUS | Status: AC
  Administered 2019-07-29 (×3): 10 meq via INTRAVENOUS
  Filled 2019-07-29 (×3): qty 100

## 2019-07-29 MED ORDER — POTASSIUM CHLORIDE CRYS ER 20 MEQ PO TBCR
40.0000 meq | EXTENDED_RELEASE_TABLET | Freq: Once | ORAL | Status: AC
  Administered 2019-07-29: 40 meq via ORAL
  Filled 2019-07-29: qty 2

## 2019-07-29 MED ORDER — MAGNESIUM SULFATE IN D5W 1-5 GM/100ML-% IV SOLN
1.0000 g | Freq: Once | INTRAVENOUS | Status: AC
  Administered 2019-07-29: 03:00:00 1 g via INTRAVENOUS
  Filled 2019-07-29: qty 100

## 2019-07-29 NOTE — Progress Notes (Signed)
Pharmacy Electrolyte Monitoring Consult:  Pharmacy consulted to assist in monitoring and replacing electrolytes in this 79 y.o. female admitted on 07/26/2019 with No chief complaint on file.   Labs:  Sodium (mmol/L)  Date Value  07/29/2019 135  02/07/2019 139  02/02/2013 135 (L)   Potassium (mmol/L)  Date Value  07/29/2019 3.1 (L)  02/02/2013 3.8   Magnesium (mg/dL)  Date Value  07/29/2019 1.9   Calcium (mg/dL)  Date Value  07/29/2019 8.3 (L)   Calcium, Total (mg/dL)  Date Value  02/20/2014 8.9   Albumin (g/dL)  Date Value  02/07/2019 4.4  08/05/2011 3.6    Assessment/Plan: Will replace W/ KCI 10 mEq IV x 3, KCI 40 mEq PO x 1 (for a total of 70 mEq of KCI which should theoretically increase K+ to 3.8), and mag sulfate 1g IV x 1 and will recheck electrolytes w/ am labs.  Tobie Lords, PharmD, BCPS Clinical Pharmacist 07/29/2019 2:19 AM

## 2019-07-29 NOTE — Progress Notes (Signed)
   07/29/19 1600  Clinical Encounter Type  Visited With Patient  Visit Type Follow-up  Referral From Chaplain  Consult/Referral To Chaplain  Spiritual Encounters  Spiritual Needs Emotional  Stress Factors  Patient Stress Factors Family relationships;Major life changes;Health changes  This is a FLU. Chaplain visited with patient at around 1615 hrs. Patient was sitting up in bed finishing her meal. During this visit patient expressed to the chaplain that she is doing better. Patient sounded hopeful. Patient stated family concerns and that seems to have an effect on her. However, patient stated that she believes that the Reita Cliche will give her and her family strength. This concludes the visit.   End of Visit  No Further

## 2019-07-29 NOTE — Progress Notes (Signed)
[facility]  Post-op Bypass Progress Note   Date of Surgery: 07/26/2019 Procedures:  Procedure(s): BYPASS GRAFT CAROTID-SUBCLAVIAN ( CAROTID TO CAROTID BYPASS) Surgeon: Surgeon(s): Dew, Erskine Squibb, MD Schnier, Dolores Lory, MD  3 Days Post-Op  History of Present Illness  Martha Peterson is a 79 y.o. female who is 3 Days Post-Op. She feels well overall but reports increased swelling in left arm that was not present before.   Imaging: No results found.  Significant Diagnostic Studies: CBC Lab Results  Component Value Date   WBC 6.1 07/29/2019   HGB 9.4 (L) 07/29/2019   HCT 28.0 (L) 07/29/2019   MCV 97.2 07/29/2019   PLT 110 (L) 07/29/2019    BMET    Component Value Date/Time   NA 135 07/29/2019 0145   NA 139 02/07/2019 1121   NA 135 (L) 02/02/2013 1038   K 3.1 (L) 07/29/2019 0145   K 3.8 02/02/2013 1038   CL 98 07/29/2019 0145   CL 102 02/02/2013 1038   CO2 31 07/29/2019 0145   CO2 29 02/02/2013 1038   GLUCOSE 109 (H) 07/29/2019 0145   GLUCOSE 111 (H) 02/02/2013 1038   BUN 16 07/29/2019 0145   BUN 20 02/07/2019 1121   BUN 15 02/02/2013 1038   CREATININE 0.71 07/29/2019 0145   CREATININE 0.99 02/20/2014 1019   CALCIUM 8.3 (L) 07/29/2019 0145   CALCIUM 8.9 02/20/2014 1019   GFRNONAA >60 07/29/2019 0145   GFRNONAA 56 (L) 02/20/2014 1019   GFRAA >60 07/29/2019 0145   GFRAA >60 02/20/2014 1019    COAG Lab Results  Component Value Date   INR 1.1 07/21/2019   INR 1.06 12/04/2014   No results found for: PTT  Physical Examination  BP Readings from Last 3 Encounters:  07/29/19 140/66  07/17/19 100/85  07/04/19 104/72   Temp Readings from Last 3 Encounters:  07/29/19 98.4 F (36.9 C) (Oral)  07/17/19 97.7 F (36.5 C) (Oral)  04/21/19 (!) 97.4 F (36.3 C)   @LASTSAO2 (3)@ Pulse Readings from Last 3 Encounters:  07/29/19 68  07/17/19 78  07/04/19 64    Pt is A&O x 3 Neck with mild swelling. incisions clean and dry. No  hematoma. Left arm is 1+ selling. Not tense no disclorartion Neuro intact  Assessment/Plan: Pt. Doing well Post-op pain is controlled Wounds are healing well continue ambulation Elevate left arm. If persists will get ultrasound  transfer to floor. repeat K in am Richardson Dopp, MD  07/29/2019 10:09 AM

## 2019-07-30 LAB — BASIC METABOLIC PANEL
Anion gap: 7 (ref 5–15)
BUN: 16 mg/dL (ref 8–23)
CO2: 36 mmol/L — ABNORMAL HIGH (ref 22–32)
Calcium: 8.7 mg/dL — ABNORMAL LOW (ref 8.9–10.3)
Chloride: 94 mmol/L — ABNORMAL LOW (ref 98–111)
Creatinine, Ser: 0.7 mg/dL (ref 0.44–1.00)
GFR calc Af Amer: >60 mL/min (ref >60)
GFR calc non Af Amer: >60 mL/min (ref >60)
Glucose, Bld: 121 mg/dL — ABNORMAL HIGH (ref 70–99)
Potassium: 3.1 mmol/L — ABNORMAL LOW (ref 3.5–5.1)
Sodium: 137 mmol/L (ref 135–145)

## 2019-07-30 LAB — MAGNESIUM: Magnesium: 1.7 mg/dL (ref 1.7–2.4)

## 2019-07-30 MED ORDER — MAGNESIUM SULFATE 2 GM/50ML IV SOLN
2.0000 g | Freq: Once | INTRAVENOUS | Status: AC
  Administered 2019-07-30: 09:00:00 2 g via INTRAVENOUS
  Filled 2019-07-30: qty 50

## 2019-07-30 MED ORDER — POTASSIUM CHLORIDE CRYS ER 20 MEQ PO TBCR: 40.0000 meq | EXTENDED_RELEASE_TABLET | Freq: Once | ORAL | Status: DC

## 2019-07-30 MED ORDER — SODIUM CHLORIDE 0.9 % IV SOLN
INTRAVENOUS | Status: DC | PRN
  Administered 2019-07-30 – 2019-07-31 (×2): 250 mL via INTRAVENOUS

## 2019-07-30 MED ORDER — SODIUM CHLORIDE 0.9 % IV BOLUS
500.0000 mL | Freq: Once | INTRAVENOUS | Status: AC
  Administered 2019-07-30: 15:00:00 500 mL via INTRAVENOUS

## 2019-07-30 MED ORDER — POTASSIUM CHLORIDE CRYS ER 20 MEQ PO TBCR
40.0000 meq | EXTENDED_RELEASE_TABLET | ORAL | Status: AC
  Administered 2019-07-30 (×2): 40 meq via ORAL
  Filled 2019-07-30 (×2): qty 2

## 2019-07-30 NOTE — Progress Notes (Addendum)
[facility]  Post-op Bypass Progress Note   Date of Surgery: 07/26/2019 Procedures:  Procedure(s): BYPASS GRAFT CAROTID-SUBCLAVIAN ( CAROTID TO CAROTID BYPASS) Surgeon: Surgeon(s): Dew, Erskine Squibb, MD Schnier, Dolores Lory, MD  3 Days Post-Op  History of Present Illness  Martha Peterson is a 79 y.o. female who is 4 Days Post-Op. She feels well overall  Arm swelling is resolved.  She feels weak and has not ambulated yet Imaging: No results found.  Significant Diagnostic Studies: CBC Lab Results  Component Value Date   WBC 6.1 07/29/2019   HGB 9.4 (L) 07/29/2019   HCT 28.0 (L) 07/29/2019   MCV 97.2 07/29/2019   PLT 110 (L) 07/29/2019    BMET    Component Value Date/Time   NA 137 07/30/2019 0507   NA 139 02/07/2019 1121   NA 135 (L) 02/02/2013 1038   K 3.1 (L) 07/30/2019 0507   K 3.8 02/02/2013 1038   CL 94 (L) 07/30/2019 0507   CL 102 02/02/2013 1038   CO2 36 (H) 07/30/2019 0507   CO2 29 02/02/2013 1038   GLUCOSE 121 (H) 07/30/2019 0507   GLUCOSE 111 (H) 02/02/2013 1038   BUN 16 07/30/2019 0507   BUN 20 02/07/2019 1121   BUN 15 02/02/2013 1038   CREATININE 0.70 07/30/2019 0507   CREATININE 0.99 02/20/2014 1019   CALCIUM 8.7 (L) 07/30/2019 0507   CALCIUM 8.9 02/20/2014 1019   GFRNONAA >60 07/30/2019 0507   GFRNONAA 56 (L) 02/20/2014 1019   GFRAA >60 07/30/2019 0507   GFRAA >60 02/20/2014 1019    COAG Lab Results  Component Value Date   INR 1.1 07/21/2019   INR 1.06 12/04/2014   No results found for: PTT  Physical Examination  BP Readings from Last 3 Encounters:  07/30/19 (!) 114/44  07/17/19 100/85  07/04/19 104/72   Temp Readings from Last 3 Encounters:  07/30/19 97.8 F (36.6 C) (Oral)  07/17/19 97.7 F (36.5 C) (Oral)  04/21/19 (!) 97.4 F (36.3 C)   @LASTSAO2 (3)@ Pulse Readings from Last 3 Encounters:  07/30/19 62  07/17/19 78  07/04/19 64    Pt is A&O x 3 Neck with mild swelling. incisions clean and dry.  No hematoma. Left arm is 1+ selling. Not tense no disclorartion Neuro intact  Assessment/Plan: Pt. Doing well. No neuro sx Post-op pain is controlled Wounds are healing well encourage ambulation K+ still low. Will replace with 80 meq. Recheck in am Richardson Dopp, MD  07/30/2019 9:24 AM

## 2019-07-30 NOTE — Progress Notes (Addendum)
Pharmacy Electrolyte Monitoring Consult:  Pharmacy consulted to assist in monitoring and replacing electrolytes in this 79 y.o. female admitted on 07/26/2019 with No chief complaint on file. s/p 2/10 BYPASS GRAFT CAROTID-SUBCLAVIAN ( CAROTID TO CAROTID BYPASS)  Labs:  Sodium (mmol/L)  Date Value  07/30/2019 137  02/07/2019 139  02/02/2013 135 (L)   Potassium (mmol/L)  Date Value  07/30/2019 3.1 (L)  02/02/2013 3.8   Magnesium (mg/dL)  Date Value  07/30/2019 1.7   Calcium (mg/dL)  Date Value  07/30/2019 8.7 (L)   Calcium, Total (mg/dL)  Date Value  02/20/2014 8.9   Albumin (g/dL)  Date Value  02/07/2019 4.4  08/05/2011 3.6    Assessment/Plan: Pt is on KCl 10 mEq daily and Magnesium oxide 400 mg daily.   Will replace W/ KCI 40 mEq IV x 2, and mag sulfate 2g IV x 1 and will recheck electrolytes w/ am labs.  Eleonore Chiquito, PharmD, BCPS Clinical Pharmacist 07/30/2019 8:32 AM

## 2019-07-31 ENCOUNTER — Ambulatory Visit: Payer: Medicare HMO | Admitting: Cardiology

## 2019-07-31 ENCOUNTER — Encounter: Payer: Self-pay | Admitting: Internal Medicine

## 2019-07-31 LAB — BASIC METABOLIC PANEL
Anion gap: 8 (ref 5–15)
BUN: 25 mg/dL — ABNORMAL HIGH (ref 8–23)
CO2: 31 mmol/L (ref 22–32)
Calcium: 8.8 mg/dL — ABNORMAL LOW (ref 8.9–10.3)
Chloride: 95 mmol/L — ABNORMAL LOW (ref 98–111)
Creatinine, Ser: 0.79 mg/dL (ref 0.44–1.00)
GFR calc Af Amer: >60 mL/min (ref >60)
GFR calc non Af Amer: >60 mL/min (ref >60)
Glucose, Bld: 130 mg/dL — ABNORMAL HIGH (ref 70–99)
Potassium: 3.5 mmol/L (ref 3.5–5.1)
Sodium: 134 mmol/L — ABNORMAL LOW (ref 135–145)

## 2019-07-31 LAB — MAGNESIUM: Magnesium: 1.7 mg/dL (ref 1.7–2.4)

## 2019-07-31 MED ORDER — POTASSIUM CHLORIDE CRYS ER 20 MEQ PO TBCR
40.0000 meq | EXTENDED_RELEASE_TABLET | Freq: Once | ORAL | Status: AC
  Administered 2019-07-31: 40 meq via ORAL
  Filled 2019-07-31: qty 2

## 2019-07-31 MED ORDER — OXYCODONE HCL 5 MG PO TABS: 5.0000 mg | ORAL_TABLET | Freq: Four times a day (QID) | ORAL | 0 refills | Status: DC | PRN

## 2019-07-31 MED ORDER — MAGNESIUM SULFATE 2 GM/50ML IV SOLN
2.0000 g | Freq: Once | INTRAVENOUS | Status: AC
  Administered 2019-07-31: 11:00:00 2 g via INTRAVENOUS
  Filled 2019-07-31: qty 50

## 2019-07-31 MED ORDER — ACYCLOVIR 5 % EX OINT
TOPICAL_OINTMENT | Freq: Every day | CUTANEOUS | Status: DC
  Filled 2019-07-31: qty 15

## 2019-07-31 NOTE — Progress Notes (Addendum)
Occupational Therapy Treatment Patient Details Name: Martha Peterson MRN: [id number] DOB: 12/16/1940 Today's Date: 07/31/2019    History of present illness Pt is pleasant 79 y/o female with PMH: COPD, mild mitral regurgitation, mild tricuspid regurgitation, pulmonary fibrosis, gout, diverticulitis, and innominate artery stenosis, now s/p left to right carotid to carotid bypass with 19mm PTFE   OT comments  Pt. Presents with 8/10 anterior neck pain, at the incision site. Pt. education was provided about A/E use for LE ADLs, energy conservation, and work simplifcation techniques during ADLs, and IADL tasks. Pt. Continues to benefit from OT services for ADL training, A/E training, and pt. Education about energy conservation, work simplification techniques, home modification, and DME. Discharge disposition continues to be appropriate.    Follow Up Recommendations  Home health OT    Equipment Recommendations  None recommended by OT    Recommendations for Other Services      Precautions / Restrictions Precautions Precautions: Fall Restrictions Weight Bearing Restrictions: No       Mobility Bed Mobility Overal bed mobility: Needs Assistance Bed Mobility: Supine to Sit     Supine to sit: +2 for safety/equipment;Supervision Sit to supine: Min guard;+2 for safety/equipment      Transfers Overall transfer level: Needs assistance Equipment used: Rolling walker (2 wheeled)   Sit to Stand: +2 safety/equipment;Min assist         General transfer comment: Per PT report    Balance                                           ADL either performed or assessed with clinical judgement   ADL                                         General ADL Comments: Pt. education was provided about A/E use, work simplification techniques, and energy conservation strategies.     Vision Baseline Vision/History: Wears glasses Wears Glasses: At all  times Patient Visual Report: No change from baseline     Perception     Praxis      Cognition Arousal/Alertness: Awake/alert Behavior During Therapy: WFL for tasks assessed/performed Overall Cognitive Status: Within Functional Limits for tasks assessed                                          Exercises     Shoulder Instructions       General Comments      Pertinent Vitals/ Pain       Pain Assessment: 0-10 Pain Score: 8  Pain Location: Anterior neck, and surgical incision site. Pain Descriptors / Indicators: Sore Pain Intervention(s): Limited activity within patient's tolerance  Home Living                                          Prior Functioning/Environment              Frequency  Min 2X/week        Progress Toward Goals  OT Goals(current goals can now be found in the care plan section)  Progress towards  OT goals: Progressing toward goals  Acute Rehab OT Goals OT Goal Formulation: With patient Potential to Achieve Goals: Good  Plan      Co-evaluation                 AM-PAC OT "6 Clicks" Daily Activity     Outcome Measure   Help from another person eating meals?: None Help from another person taking care of personal grooming?: A Little Help from another person toileting, which includes using toliet, bedpan, or urinal?: A Little Help from another person bathing (including washing, rinsing, drying)?: A Little Help from another person to put on and taking off regular upper body clothing?: None Help from another person to put on and taking off regular lower body clothing?: A Little 6 Click Score: 20    End of Session Equipment Utilized During Treatment: Gait belt  OT Visit Diagnosis: Unsteadiness on feet (R26.81);Muscle weakness (generalized) (M62.81)   Activity Tolerance Patient tolerated treatment well   Patient Left in bed;with call bell/phone within reach   Nurse Communication Mobility status         Time: [id number] OT Time Calculation (min): 22 min  Charges: OT General Charges $OT Visit: 1 Visit OT Treatments $Self Care/Home Management : 8-22 mins  Harrel Carina, MS, OTR/L  Harrel Carina 07/31/2019, 10:26 AM

## 2019-07-31 NOTE — Progress Notes (Signed)
Pharmacy Electrolyte Monitoring Consult:  Pharmacy consulted to assist in monitoring and replacing electrolytes in this 79 y.o. 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   replace W/ KCI 40 mEq po x 1, and mag sulfate 2g IV x 1    recheck electrolytes w/ am labs.  Vallery Sa, PharmD, BCPS Clinical Pharmacist 07/31/2019 9:08 AM

## 2019-07-31 NOTE — TOC Initial Note (Signed)
Transition of Care [facility]) - Initial/Assessment Note    Patient Details  Name: Martha Peterson MRN: [id number] Date of Birth: Jul 14, 1940  Transition of Care [facility]) CM/SW Contact:    Martha Sessions, RN Phone Number: 07/31/2019, 1:34 PM  Clinical Narrative:                 Patient admitted from home with innominate artery stenosis  Patient states that she lives at home with her husband  PCP [place] -  Denies issues obtaining medications.   States at baseline she drives and takes her self to appointments.  Daughter lives locally and will be picking up patient at discharge   PT and OT has assessed patient and recommends HH.  Patient agreeable, and states that she would like [facility] home health.  Referral made and accepted by Martha Peterson with [facility].    Patient states that she has a RW, cane, rollator, and tub bench in the home   Expected Discharge Plan: [place]     Patient Goals and CMS Choice     Choice offered to / list presented to : Patient  Expected Discharge Plan and Services Expected Discharge Plan: [place]   Discharge Planning Services: CM Consult Post Acute Care Choice: [place] arrangements for the past 2 months: Single Family Home Expected Discharge Date: 07/31/19                         HH Arranged: PT, OT   Date HH Agency Contacted: 07/31/19   Representative spoke with at [place]: Martha Peterson  Prior Living Arrangements/Services Living arrangements for the past 2 months: [place] Lives with:: Spouse Patient language and need for interpreter reviewed:: Yes Do you feel safe going back to the place where you live?: Yes      Need for Family Participation in Patient Care: Yes (Comment) Care giver support system in place?: Yes (comment) Current home services: DME Criminal Activity/Legal Involvement Pertinent to Current Situation/Hospitalization: No - Comment as needed  Activities  of Daily Living Home Assistive Devices/Equipment: Environmental consultant (specify type), Cane (specify quad or straight), Eyeglasses, Dentures (specify type) ADL Screening (condition at time of admission) Patient's cognitive ability adequate to safely complete daily activities?: Yes Is the patient deaf or have difficulty hearing?: No Does the patient have difficulty seeing, even when wearing glasses/contacts?: No Does the patient have difficulty concentrating, remembering, or making decisions?: No Patient able to express need for assistance with ADLs?: Yes Does the patient have difficulty dressing or bathing?: No Independently performs ADLs?: Yes (appropriate for developmental age) Does the patient have difficulty walking or climbing stairs?: No Weakness of Legs: None Weakness of Arms/Hands: None  Permission Sought/Granted                  Emotional Assessment Appearance:: Appears stated age     Orientation: : Oriented to Self, Oriented to Place, Oriented to  Time, Oriented to Situation   Psych Involvement: No (comment)  Admission diagnosis:  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[place]) [I77.1] Patient Active Problem List   Diagnosis Date Noted  . 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[place]) 07/04/2019  . Left ventricular systolic dysfunction [id number]  . Diastolic dysfunction [id number]  . Degenerative joint disease involving multiple joints 05/07/2019  . Nonalcoholic fatty liver disease 02/12/2019  . Dysuria 02/12/2019  . Encounter for long-term (current) use of medications 01/22/2019  .  Hypokalemia 04/27/2018  . Osteopenia 04/04/2018  . Hypothyroidism (acquired) 02/14/2018  . Neutropenia ([place]) 02/14/2018  . Primary osteoarthritis of left hip 12/06/2017  . Essential hypertension 09/29/2017  . Postural dizziness 09/29/2017  . 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 Acquired left ventricular hypertrophy 09/29/2017  .  Inflammatory polyarthritis ([place]) 09/29/2017  . Need for vaccination for Strep pneumoniae 09/29/2017  . H/O ankle fusion 05/02/2016  . 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 Abnormal LFTs 02/13/2014  . 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 Primary osteoarthritis of both hands 02/13/2014  . H/O: gout 02/13/2014  . Rheumatoid factor positive 02/13/2014   PCP:  Martha Guise, MD Pharmacy:   [facility], [place] [address] Phone: [phone] Fax: [phone]  [facility] [address] ([address]), [place] - [place]) [place] [address] Phone: [phone] Fax: [phone]     Social Determinants of Health (SDOH) Interventions    Readmission Risk Interventions No flowsheet data found.

## 2019-07-31 NOTE — Progress Notes (Signed)
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 Pt's SPO2 and HR were WNL on RA at rest, with exercises, and after ambulation. Pt ambulated 60 feet with a subjective report of feeling tired by the end, but stated that she probably could have went further. Additional walking was deferred due to pt wanting to save energy for planned D/C today. While gait is decreased and more effortful, pt had good stability and appropriate sequencing during all amb today. Pt will benefit from HHPT services upon discharge to safely address deficits listed in patient problem list for decreased caregiver assistance and eventual return to PLOF.      Follow Up Recommendations  Home health PT     Equipment Recommendations  None recommended by PT    Recommendations for Other Services       Precautions / Restrictions Precautions Precautions: Fall Restrictions Weight Bearing Restrictions: No    Mobility  Bed Mobility Overal bed mobility: Needs Assistance Bed Mobility: Supine to Sit     Supine to sit: Supervision    General bed mobility comments: Pt did not require physical assistance but required extra time. Good sequencing of movement  Transfers Overall transfer level: Needs assistance Equipment used: Rolling walker (2 wheeled) Transfers: Sit to/from Stand Sit to Stand: Min guard         General transfer comment: extra effort and time to complete sit-to-stand transfer.  Ambulation/Gait Ambulation/Gait assistance: Min guard Gait Distance (Feet): 60 Feet Assistive device:  Rolling walker (2 wheeled) Gait Pattern/deviations: Step-through pattern;Decreased stride length Gait velocity: decreased   General Gait Details: inc in weight shift side-to-side. Reported feeling tired afterwards but pt stated that she could have walked further   Stairs             Wheelchair Mobility    Modified Rankin (Stroke Patients Only)       Balance Overall balance assessment: Needs assistance Sitting-balance support: Feet supported;Single extremity supported Sitting balance-Leahy Scale: Good     Standing balance support: Bilateral upper extremity supported;During functional activity Standing balance-Leahy Scale: Good                              Cognition Arousal/Alertness: Awake/alert Behavior During Therapy: WFL for tasks assessed/performed Overall Cognitive Status: Within Functional Limits for tasks assessed                                        Exercises Total Joint Exercises Ankle Circles/Pumps: AROM;Strengthening;Both;15 reps Quad Sets: Strengthening;Both;15 reps Gluteal Sets: Strengthening;Both;15 reps Long Arc Quad: AROM;Strengthening;Both;10 reps Marching in Standing: AROM;Strengthening;Both;5 reps Other Exercises Other Exercises: EOB static sitting    General Comments        Pertinent Vitals/Pain Pain Assessment: 0-10 Pain Score: 6  Pain Location: Throat Pain Descriptors / Indicators: Sore Pain Intervention(s): Monitored during session;Premedicated before session    Home Living  Prior Function            PT Goals (current goals can now be found in the care plan section) Progress towards PT goals: Progressing toward goals    Frequency    Min 2X/week      PT Plan Current plan remains appropriate    Co-evaluation              AM-PAC PT "6 Clicks" Mobility   Outcome Measure  Help needed turning from your back to your side while in a flat bed without using  bedrails?: A Little Help needed moving from lying on your back to sitting on the side of a flat bed without using bedrails?: A Little Help needed moving to and from a bed to a chair (including a wheelchair)?: A Little Help needed standing up from a chair using your arms (e.g., wheelchair or bedside chair)?: A Little Help needed to walk in hospital room?: A Little Help needed climbing 3-5 steps with a railing? : A Lot 6 Click Score: 17    End of Session Equipment Utilized During Treatment: Gait belt Activity Tolerance: Patient tolerated treatment well Patient left: in chair;with call bell/phone within reach;with chair alarm set Nurse Communication: Mobility status PT Visit Diagnosis: Muscle weakness (generalized) (M62.81);Unsteadiness on feet (R26.81);Difficulty in walking, not elsewhere classified (R26.2)     Time: [id number] PT Time Calculation (min) (ACUTE ONLY): 24 min  Charges:                        Annabelle Harman, SPT 07/31/19 1:35 PM

## 2019-07-31 NOTE — Progress Notes (Signed)
MD ordered patient to be discharged home.  Discharge instructions were reviewed with the patient and she voiced understanding.  Follow-up appointment was made.  Prescriptions sent to the patients pharmacy.  IV was removed with catheter intact.  All patients questions were answered.  Patient left via wheelchair escorted by NT.  

## 2019-07-31 NOTE — Telephone Encounter (Signed)
Martha Peterson I sent Martha Peterson a message about this earlier, but I didn't realize she had follow up appointment tomorrow. Can we reschedule her appointment for sometime in the next week. Has to be hospital follow up, not regular follow up. Just call and let her know what we can do. Thanks.

## 2019-07-31 NOTE — Discharge Summary (Signed)
[facility]    Discharge Summary  Patient ID:  Martha Peterson MRN: [id number] DOB/AGE: 1940/10/13 79 y.o.  Admit date: 07/26/2019 Discharge date: 07/31/2019 Date of Surgery: 07/26/2019 Surgeon: Surgeon(s): Dew, Erskine Squibb, MD Schnier, Dolores Lory, MD  Admission Diagnosis: Innominate artery stenosis [facility]) [I77.1]  Discharge Diagnoses:  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[place]) [I77.1]  Secondary Diagnoses: Past Medical History:  Diagnosis Date  . Arthritis   . Asthma   . COPD (chronic obstructive pulmonary disease) ([place])   . Diverticulitis   . Gout   . Hyperlipidemia   . Hypertension   . Hypothyroidism   . 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 Microhematuria   . Mild mitral regurgitation   . Mild tricuspid regurgitation   . Osteopenia   . Pulmonary fibrosis ([place])   . Urinary incontinence    Procedure(s): BYPASS GRAFT CAROTID-SUBCLAVIAN ( CAROTID TO CAROTID BYPASS)  Discharged Condition: Good  HPI / Hospital Course:  Martha Peterson is a 79 y.o. 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  I discussed with the patient the risks, benefits, and alternatives to carotid  To carotid bypass.  I discussed the differences between carotid stenting and carotid endarterectomy and bypass. I discussed the procedural details of carotid to carotid bypass with the patient.  The patient is aware that the risks of surgery include but are not limited to: bleeding, infection, stroke, myocardial infarction, death, cranial nerve injuries both temporary and permanent, neck hematoma, possible airway compromise, labile blood pressure post-operatively, cerebral hyperperfusion syndrome, and possible need for additional interventions in the future. The patient is aware of the risks and agrees to proceed forward with the procedure.  The patient tolerated the procedure well was transferred to the ICU  without complication.  Patient sign of surgery was unremarkable.  During the patient's inpatient stay, her diet was advanced without complication, her Foley was removed, her pain was controlled with the use of p.o. pain medication and she was ambulating at baseline.  The patient received a occupational and physical therapy consult who work with her through her inpatient stay and recommended home PT/OT.  Social work has arranged this for discharge.  The patient did experience some intermittent hypotension during her inpatient stay.  The patient was asymptomatic through these intermittent hypotension episodes.  On the day of discharge, the patient was afebrile with stable vital signs.  Extubated: POD # 0  Physical exam:  Alert and oriented x3 Face: Symmetrical.  Tongue is midline. Neck: Some ecchymosis noted around incision sites.  Swelling has improved. Trachea is midline. Cardiovascular: Regular rate and rhythm Pulmonary: Clear to auscultation bilaterally Abdomen: Soft, nontender, nondistended, positive bowel sounds Vascular: 2+ radial pulses noted Neuro: 5 out of 5 upper/lower/right/left  Labs as below  Complications:none  Consults:  Physical Therapy Occupational Therapy  Significant Diagnostic Studies: CBC Lab Results  Component Value Date   WBC 6.1 07/29/2019   HGB 9.4 (L) 07/29/2019   HCT 28.0 (L) 07/29/2019   MCV 97.2 07/29/2019   PLT 110 (L) 07/29/2019   BMET    Component Value Date/Time   NA 134 (L) 07/31/2019 0528   NA 139 02/07/2019 1121   NA 135 (L) 02/02/2013 1038   K 3.5 07/31/2019 0528   K 3.8 02/02/2013 1038   CL 95 (L) 07/31/2019 0528   CL 102 02/02/2013 1038   CO2 31 07/31/2019 0528   CO2 29 02/02/2013 1038   GLUCOSE  130 (H) 07/31/2019 0528   GLUCOSE 111 (H) 02/02/2013 1038   BUN 25 (H) 07/31/2019 0528   BUN 20 02/07/2019 1121   BUN 15 02/02/2013 1038   CREATININE 0.79 07/31/2019 0528   CREATININE 0.99 02/20/2014 1019   CALCIUM 8.8 (L) 07/31/2019  0528   CALCIUM 8.9 02/20/2014 1019   GFRNONAA >60 07/31/2019 0528   GFRNONAA 56 (L) 02/20/2014 1019   GFRAA >60 07/31/2019 0528   GFRAA >60 02/20/2014 1019   COAG Lab Results  Component Value Date   INR 1.1 07/21/2019   INR 1.06 12/04/2014   Disposition:  Discharge to :Home  Allergies as of 07/31/2019      Reactions   Codeine    Levaquin [levofloxacin]    Tape    latex      Medication List    STOP taking these medications   Zoster Vaccine Adjuvanted injection Commonly known as: SHINGRIX     TAKE these medications   allopurinol 100 MG tablet Commonly known as: ZYLOPRIM Take 2 tablets (200 mg total) by mouth daily.   ascorbic acid 500 MG tablet Commonly known as: VITAMIN C Take 500 mg by mouth daily.   aspirin EC 81 MG tablet Take 81 mg by mouth daily.   atorvastatin 10 MG tablet Commonly known as: LIPITOR Take 1 tablet (10 mg total) by mouth daily at 6 PM.   carvedilol 25 MG tablet Commonly known as: COREG Take 0.5 tablets (12.5 mg total) by mouth 2 (two) times daily with a meal.   cholecalciferol 10 MCG (400 UNIT) Tabs tablet Commonly known as: VITAMIN D3 Take 1,000 Units by mouth.   cloNIDine 0.1 MG tablet Commonly known as: CATAPRES Take 1 tablet (0.1 mg total) by mouth 2 (two) times daily.   clopidogrel 75 MG tablet Commonly known as: Plavix Take 1 tablet (75 mg total) by mouth daily.   diclofenac sodium 1 % Gel Commonly known as: VOLTAREN Apply 3 grams to 3 large joints, up to 3 times daily as needed.   furosemide 40 MG tablet Commonly known as: LASIX Take one tablet by mouth two times daily   GINGER PO Take by mouth.   ibuprofen 600 MG tablet Commonly known as: ADVIL Take 1 tablet (600 mg total) by mouth every 8 (eight) hours as needed.   ketorolac 30 MG/ML injection Commonly known as: TORADOL ketorolac 30 mg/mL injection syringe  Inject 1 mL by intramuscular route.   levocetirizine 5 MG tablet Commonly known as: XYZAL TAKE 1  TABLET EVERY DAY  FOR  ALLERGIES   levothyroxine 137 MCG tablet Commonly known as: SYNTHROID Take 1 tablet (137 mcg total) by mouth daily before breakfast.   losartan-hydrochlorothiazide 100-25 MG tablet Commonly known as: HYZAAR Take 1 tablet by mouth daily.   magnesium oxide 400 MG tablet Commonly known as: MAG-OX Take 400 mg by mouth daily.   meclizine 12.5 MG tablet Commonly known as: ANTIVERT Take 12.5 mg by mouth as needed.   omega-3 acid ethyl esters 1 g capsule Commonly known as: LOVAZA Take 1 g by mouth 2 (two) times daily.   omeprazole 20 MG capsule Commonly known as: PRILOSEC Take one capsule by mouth two times daily   oxyCODONE 5 MG immediate release tablet Commonly known as: Oxy IR/ROXICODONE Take 1 tablet (5 mg total) by mouth every 6 (six) hours as needed for severe pain. What changed: when to take this   potassium chloride 10 MEQ tablet Commonly known as: KLOR-CON Take 1 tablet (10  mEq total) by mouth daily.   predniSONE 5 MG tablet Commonly known as: DELTASONE Take 2 tablets (10 mg total) by mouth daily with breakfast.   PRESERVISION AREDS 2 PO Take by mouth 2 (two) times daily.   TART CHERRY ADVANCED PO Take by mouth daily.   tiZANidine 4 MG tablet Commonly known as: ZANAFLEX Take 1 tablet (4 mg total) by mouth 2 (two) times daily.   traMADol 50 MG tablet Commonly known as: ULTRAM Take 1 tablet (50 mg total) by mouth every 8 (eight) hours as needed.   TURMERIC PO Take by mouth daily.   vitamin B-12 100 MCG tablet Commonly known as: CYANOCOBALAMIN   vitamin E 180 MG (400 UNITS) capsule Take 400 Units by mouth 2 (two) times daily.      Verbal and written Discharge instructions given to the patient. Wound care per Discharge AVS Follow-up Information    Algernon Huxley, MD. Go on 08/08/2019.   Specialties: Vascular Surgery, Radiology, Interventional Cardiology Why: See Dew. First post-op visit. Will need carotid duplex with visit. 11am  appointment Contact information: [place] [address] [phone]        Ronnell Freshwater, NP. Go on 08/08/2019.   Specialty: Family Medicine Why: First post-op. BP check. EPIC message sent to [facility]. 11am appointment Contact information: [place] [address] [phone]          Signed: Sela Hua, PA-C  07/31/2019, 11:43 AM

## 2019-07-31 NOTE — Discharge Instructions (Signed)
Vascular Surgery Discharge Instructions  1) You may shower.  Gently clean your incisions with soap.  Gently pat them dry. 2) Take your blood pressure on your left arm at least 3 times a day or before you take your blood pressure medications. 3) Please keep a log of your blood pressure readings for your primary care physician. 4) No strenuous activity or lifting greater than 10 pounds until you are cleared by Dr. Lucky Cowboy during your first postoperative visit. 5) No driving until you are cleared by Dr. Lucky Cowboy during your first postoperative visit.

## 2019-07-31 NOTE — Care Management Important Message (Signed)
Important Message  Patient Details  Name: Martha Peterson MRN: [id number] Date of Birth: February 26, 1941   Medicare Important Message Given:  Yes     Dannette Barbara 07/31/2019, 11:41 AM

## 2019-08-01 ENCOUNTER — Ambulatory Visit: Payer: Medicare HMO | Admitting: Internal Medicine

## 2019-08-02 ENCOUNTER — Encounter: Payer: Self-pay | Admitting: Nurse Practitioner

## 2019-08-02 ENCOUNTER — Ambulatory Visit (INDEPENDENT_AMBULATORY_CARE_PROVIDER_SITE_OTHER): Payer: Medicare HMO | Admitting: Nurse Practitioner

## 2019-08-02 VITALS — BP 145/86 | HR 81 | Ht 63.0 in | Wt 193.0 lb

## 2019-08-02 DIAGNOSIS — I10 Essential (primary) hypertension: Secondary | ICD-10-CM

## 2019-08-02 DIAGNOSIS — Z09 Encounter for follow-up examination after completed treatment for conditions other than malignant neoplasm: Secondary | ICD-10-CM | POA: Diagnosis not present

## 2019-08-02 DIAGNOSIS — E876 Hypokalemia: Secondary | ICD-10-CM | POA: Diagnosis not present

## 2019-08-02 DIAGNOSIS — I6523 Occlusion and stenosis of bilateral carotid arteries: Secondary | ICD-10-CM

## 2019-08-02 NOTE — Progress Notes (Signed)
[facility] - [address], [place] [address]  Internal MEDICINE  Telephone Visit  Patient Name: Martha Peterson  [id number]  AC:[id number]  Date of Service: 08/02/2019  I connected with the patient at 11;19am by telephone and verified the patients identity using two identifiers.   I discussed the limitations, risks, security and privacy concerns of performing an evaluation and management service by telephone and the availability of in person appointments. I also discussed with the patient that there may be a patient responsible charge related to the service.  The patient expressed understanding and agrees to proceed.    Chief Complaint  Patient presents with  . Telephone Assessment  . Telephone Screen  . Hospitalization Follow-up    innominate artery stenosis    The patient has been contacted via telephone for follow up visit due to concerns for spread of novel coronavirus. The patient presents for hospital follow up. She was hospitalized from 07/26/2019 through 07/31/2019. She had carotid artery bypass.  BYPASS GRAFT CAROTID-SUBCLAVIAN ( CAROTID TO CAROTID BYPASS. She states that she is diong well overall. She has a considerable amount of bruising from right side of neck into the right breast area. She is sore. She states that she is getting up more often to use the bathroom and to move around. Using her walker every time she gets up. While hospitalized, she had a few episodes of hypotension. She has a new blood pressure monitor at home. She is checking her blood pressure several times each day. Has not had any of these episodes since she was discharged. Labs indicate mild anemia in the hospital which was improving prior to discharge. Last BMP potassium was 3.1. she is onw on potassium 40mEq everyday.       Current Medication: Outpatient Encounter Medications as of 08/02/2019  Medication Sig Note  . allopurinol (ZYLOPRIM) 100 MG tablet Take 2 tablets (200 mg total) by  mouth daily.   Marland Kitchen ascorbic acid (VITAMIN C) 500 MG tablet Take 500 mg by mouth daily.   Marland Kitchen aspirin EC 81 MG tablet Take 81 mg by mouth daily.   Marland Kitchen atorvastatin (LIPITOR) 10 MG tablet Take 1 tablet (10 mg total) by mouth daily at 6 PM.   . carvedilol (COREG) 25 MG tablet Take 0.5 tablets (12.5 mg total) by mouth 2 (two) times daily with a meal.   . cholecalciferol (VITAMIN D) 400 UNITS TABS tablet Take 1,000 Units by mouth.   . cloNIDine (CATAPRES) 0.1 MG tablet Take 1 tablet (0.1 mg total) by mouth 2 (two) times daily.   . clopidogrel (PLAVIX) 75 MG tablet Take 1 tablet (75 mg total) by mouth daily.   . diclofenac sodium (VOLTAREN) 1 % GEL Apply 3 grams to 3 large joints, up to 3 times daily as needed.   . furosemide (LASIX) 40 MG tablet Take one tablet by mouth two times daily   . Ginger, Zingiber officinalis, (GINGER PO) Take by mouth.   Marland Kitchen ibuprofen (ADVIL) 600 MG tablet Take 1 tablet (600 mg total) by mouth every 8 (eight) hours as needed.   Marland Kitchen ketorolac (TORADOL) 30 MG/ML injection ketorolac 30 mg/mL injection syringe  Inject 1 mL by intramuscular route.   Marland Kitchen levocetirizine (XYZAL) 5 MG tablet TAKE 1 TABLET EVERY DAY  FOR  ALLERGIES   . levothyroxine (SYNTHROID) 137 MCG tablet Take 1 tablet (137 mcg total) by mouth daily before breakfast.   . losartan-hydrochlorothiazide (HYZAAR) 100-25 MG tablet Take 1 tablet by mouth daily.   Marland Kitchen  magnesium oxide (MAG-OX) 400 MG tablet Take 400 mg by mouth daily.  08/20/2015: Received from: External Pharmacy  . meclizine (ANTIVERT) 12.5 MG tablet Take 12.5 mg by mouth as needed.    . Misc Natural Products (TART CHERRY ADVANCED PO) Take by mouth daily.   . Multiple Vitamins-Minerals (PRESERVISION AREDS 2 PO) Take by mouth 2 (two) times daily.   Marland Kitchen omega-3 acid ethyl esters (LOVAZA) 1 G capsule Take 1 g by mouth 2 (two) times daily.   Marland Kitchen omeprazole (PRILOSEC) 20 MG capsule Take one capsule by mouth two times daily   . oxyCODONE (OXY IR/ROXICODONE) 5 MG immediate  release tablet Take 1 tablet (5 mg total) by mouth every 6 (six) hours as needed for severe pain.   . potassium chloride (KLOR-CON) 10 MEQ tablet Take 1 tablet (10 mEq total) by mouth daily.   . predniSONE (DELTASONE) 5 MG tablet Take 2 tablets (10 mg total) by mouth daily with breakfast.   . tiZANidine (ZANAFLEX) 4 MG tablet Take 1 tablet (4 mg total) by mouth 2 (two) times daily.   . traMADol (ULTRAM) 50 MG tablet Take 1 tablet (50 mg total) by mouth every 8 (eight) hours as needed.   . TURMERIC PO Take by mouth daily.   . vitamin B-12 (CYANOCOBALAMIN) 100 MCG tablet    . vitamin E 400 UNIT capsule Take 400 Units by mouth 2 (two) times daily.  08/20/2015: Received from: External Pharmacy   No facility-administered encounter medications on file as of 08/02/2019.    Surgical History: Past Surgical History:  Procedure Laterality Date  . ABDOMINAL HYSTERECTOMY    . ANKLE SURGERY Left   . APPENDECTOMY    . CAROTID PTA/STENT INTERVENTION N/A 07/17/2019   Procedure: CAROTID PTA/STENT INTERVENTION;  Surgeon: Algernon Huxley, MD;  Location: [facility];  Service: Cardiovascular;  Laterality: N/A;  . CAROTID-SUBCLAVIAN BYPASS GRAFT N/A 07/26/2019   Procedure: BYPASS GRAFT CAROTID-SUBCLAVIAN ( CAROTID TO CAROTID BYPASS);  Surgeon: Algernon Huxley, MD;  Location: [facility];  Service: Vascular;  Laterality: N/A;  . CHOLECYSTECTOMY    . COLONOSCOPY WITH PROPOFOL N/A 12/04/2014   Procedure: COLONOSCOPY WITH PROPOFOL;  Surgeon: Lollie Sails, MD;  Location: [facility];  Service: Endoscopy;  Laterality: N/A;  . FRACTURE SURGERY     Femur Fx; LT Ankle Pinning  . JOINT REPLACEMENT     RT TKR  . Patial Thyroid Resection      Medical History: Past Medical History:  Diagnosis Date  . Arthritis   . Asthma   . COPD (chronic obstructive pulmonary disease) ([place])   . Diverticulitis   . Gout   . Hyperlipidemia   . Hypertension   . Hypothyroidism   . 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 Microhematuria   . Mild mitral regurgitation   . Mild tricuspid regurgitation   . Osteopenia   . Pulmonary fibrosis ([place])   . Urinary incontinence     Family History: Family History  Problem Relation Age of Onset  . Heart Problems Mother   . Heart Problems Father   . Diabetes Father   . Lupus Sister   . Thyroid disease Sister   . Heart Problems Sister   . Hypertension Son   . Diabetes Son        borderline   . Arthritis Daughter        in the left knee   . Diabetes Daughter   . Neuropathy Daughter   .  Depression Daughter   . Anxiety disorder Daughter     Social History   Socioeconomic History  . Marital status: Married    Spouse name: Jeneen Rinks  . Number of children: Not on file  . Years of education: Not on file  . Highest education level: Not on file  Occupational History  . Not on file  Tobacco Use  . Smoking status: Former Smoker    Years: 1.00    Types: Cigarettes  . Smokeless tobacco: Never Used  Substance and Sexual Activity  . Alcohol use: No  . Drug use: No  . Sexual activity: Not Currently  Other Topics Concern  . Not on file  Social History Narrative   Lives at home with husband in private residence   Social Determinants of Health   Financial Resource Strain:   . Difficulty of Paying Living Expenses: Not on file  Food Insecurity:   . Worried About Charity fundraiser in the Last Year: Not on file  . Ran Out of Food in the Last Year: Not on file  Transportation Needs:   . Lack of Transportation (Medical): Not on file  . Lack of Transportation (Non-Medical): Not on file  Physical Activity:   . Days of Exercise per Week: Not on file  . Minutes of Exercise per Session: Not on file  Stress:   . Feeling of Stress : Not on file  Social Connections:   . Frequency of Communication with Friends and Family: Not on file  . Frequency of Social Gatherings with Friends and Family: Not on file  . Attends Religious Services: Not  on file  . Active Member of Clubs or Organizations: Not on file  . Attends Archivist Meetings: Not on file  . Marital Status: Not on file  Intimate Partner Violence:   . Fear of Current or Ex-Partner: Not on file  . Emotionally Abused: Not on file  . Physically Abused: Not on file  . Sexually Abused: Not on file      Review of Systems  Constitutional: Positive for activity change and fatigue. Negative for chills and unexpected weight change.       Using a walker to get up and down after having surgery 07/26/2019  HENT: Positive for postnasal drip. Negative for congestion, rhinorrhea, sneezing and sore throat.        States that her throat/neck is sore at times.   Respiratory: Negative for cough, chest tightness, shortness of breath and wheezing.   Cardiovascular: Negative for chest pain and palpitations.       Blood pressure is stable.   Gastrointestinal: Negative for abdominal pain, constipation, diarrhea, nausea and vomiting.  Endocrine: Negative for cold intolerance, heat intolerance, polydipsia and polyuria.  Musculoskeletal: Positive for myalgias and neck pain. Negative for arthralgias, back pain and joint swelling.       Neck tenderness present after carotid bypass surgery 07/26/2019  Skin: Negative for rash.  Allergic/Immunologic: Negative for environmental allergies.  Neurological: Positive for headaches. Negative for dizziness, tremors and numbness.       Gets mild headache for a few moments sometimes after standing up from seated or laying position.   Hematological: Negative for adenopathy. Does not bruise/bleed easily.  Psychiatric/Behavioral: Negative for behavioral problems (Depression), sleep disturbance and suicidal ideas. The patient is not nervous/anxious.     Today's Vitals   08/02/19 1109  BP: (!) 145/86  Pulse: 81  Weight: 193 lb (87.5 kg)  Height: 5\' 3"  (1.6 m)  Body mass index is 34.19 kg/m.  Observation/Objective:   The patient is alert  and oriented. She is pleasant and answers all questions appropriately. Breathing is non-labored. She is in no acute distress at this time.    Assessment/Plan:  1. Hospital discharge follow-up Patient hospitalized from 07/26/2019 through 07/31/2019 after carotid bypass surgery. Reviewed progress notes and lab results with the patient. She reports increased activity. Using walker to help with mobility.   2. 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 She is stable and doing well. Has follow up with Vascular surgeon 08/08/2019  3. Essential hypertension Blood pressure stable. Continue to monitor closely at home. Adjust medication as indicated.   4. Hypokalemia The patient should continue potassium 74mEq daily.   General Counseling: sua milson understanding of the findings of today's phone visit and agrees with plan of treatment. I have discussed any further diagnostic evaluation that may be needed or ordered today. We also reviewed her medications today. she has been encouraged to call the office with any questions or concerns that should arise related to todays visit.  This patient was seen by [name] FNP Collaboration with Dr [name] as a part of collaborative care agreement   Time spent: 25 Minutes    Dr [name] Internal medicine

## 2019-08-03 ENCOUNTER — Ambulatory Visit: Payer: Medicare HMO | Admitting: Nurse Practitioner

## 2019-08-04 ENCOUNTER — Ambulatory Visit: Payer: Medicare HMO | Admitting: Nurse Practitioner

## 2019-08-04 ENCOUNTER — Telehealth (INDEPENDENT_AMBULATORY_CARE_PROVIDER_SITE_OTHER): Payer: Self-pay

## 2019-08-04 DIAGNOSIS — Z48812 Encounter for surgical aftercare following surgery on the circulatory system: Secondary | ICD-10-CM | POA: Diagnosis not present

## 2019-08-04 DIAGNOSIS — M1612 Unilateral primary osteoarthritis, left hip: Secondary | ICD-10-CM | POA: Diagnosis not present

## 2019-08-04 DIAGNOSIS — M19042 Primary osteoarthritis, left hand: Secondary | ICD-10-CM | POA: Diagnosis not present

## 2019-08-04 DIAGNOSIS — J8410 Pulmonary fibrosis, unspecified: Secondary | ICD-10-CM | POA: Diagnosis not present

## 2019-08-04 DIAGNOSIS — M109 Gout, unspecified: Secondary | ICD-10-CM | POA: Diagnosis not present

## 2019-08-04 DIAGNOSIS — I10 Essential (primary) hypertension: Secondary | ICD-10-CM | POA: Diagnosis not present

## 2019-08-04 DIAGNOSIS — J449 Chronic obstructive pulmonary disease, unspecified: Secondary | ICD-10-CM | POA: Diagnosis not present

## 2019-08-04 DIAGNOSIS — M19041 Primary osteoarthritis, right hand: Secondary | ICD-10-CM | POA: Diagnosis not present

## 2019-08-04 DIAGNOSIS — I081 Rheumatic disorders of both mitral and tricuspid valves: Secondary | ICD-10-CM | POA: Diagnosis not present

## 2019-08-04 NOTE — Telephone Encounter (Signed)
I would say that as long as there is a week in between her surgery and the vaccine she should be fine.  This is so that if she has complications from the surgery we can try to distinguish whether it's likely attributed to the surgery or the vaccine.

## 2019-08-04 NOTE — Telephone Encounter (Signed)
Called pt and made her aware of the the message below.

## 2019-08-08 ENCOUNTER — Ambulatory Visit (INDEPENDENT_AMBULATORY_CARE_PROVIDER_SITE_OTHER): Payer: Medicare HMO

## 2019-08-08 ENCOUNTER — Encounter (INDEPENDENT_AMBULATORY_CARE_PROVIDER_SITE_OTHER): Payer: Self-pay | Admitting: Vascular Surgery

## 2019-08-08 ENCOUNTER — Other Ambulatory Visit (INDEPENDENT_AMBULATORY_CARE_PROVIDER_SITE_OTHER): Payer: Self-pay | Admitting: Vascular Surgery

## 2019-08-08 ENCOUNTER — Ambulatory Visit (INDEPENDENT_AMBULATORY_CARE_PROVIDER_SITE_OTHER): Payer: Medicare HMO | Admitting: Vascular Surgery

## 2019-08-08 ENCOUNTER — Other Ambulatory Visit: Payer: Self-pay

## 2019-08-08 VITALS — BP 125/55 | HR 56 | Resp 16 | Ht 63.0 in | Wt 198.0 lb

## 2019-08-08 DIAGNOSIS — J449 Chronic obstructive pulmonary disease, unspecified: Secondary | ICD-10-CM | POA: Diagnosis not present

## 2019-08-08 DIAGNOSIS — I10 Essential (primary) hypertension: Secondary | ICD-10-CM | POA: Diagnosis not present

## 2019-08-08 DIAGNOSIS — H34211 Partial retinal artery occlusion, right eye: Secondary | ICD-10-CM

## 2019-08-08 DIAGNOSIS — M109 Gout, unspecified: Secondary | ICD-10-CM | POA: Diagnosis not present

## 2019-08-08 DIAGNOSIS — M19041 Primary osteoarthritis, right hand: Secondary | ICD-10-CM | POA: Diagnosis not present

## 2019-08-08 DIAGNOSIS — Z48812 Encounter for surgical aftercare following surgery on the circulatory system: Secondary | ICD-10-CM | POA: Diagnosis not present

## 2019-08-08 DIAGNOSIS — Z95828 Presence of other vascular implants and grafts: Secondary | ICD-10-CM

## 2019-08-08 DIAGNOSIS — M1612 Unilateral primary osteoarthritis, left hip: Secondary | ICD-10-CM | POA: Diagnosis not present

## 2019-08-08 DIAGNOSIS — I081 Rheumatic disorders of both mitral and tricuspid valves: Secondary | ICD-10-CM | POA: Diagnosis not present

## 2019-08-08 DIAGNOSIS — I771 Stricture of artery: Secondary | ICD-10-CM | POA: Diagnosis not present

## 2019-08-08 DIAGNOSIS — J8410 Pulmonary fibrosis, unspecified: Secondary | ICD-10-CM | POA: Diagnosis not present

## 2019-08-08 DIAGNOSIS — M19042 Primary osteoarthritis, left hand: Secondary | ICD-10-CM | POA: Diagnosis not present

## 2019-08-08 NOTE — Assessment & Plan Note (Signed)
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  This should be corrected by the carotid to carotid bypass with ligation of the more proximal common carotid artery

## 2019-08-08 NOTE — Assessment & Plan Note (Addendum)
blood pressure control important in reducing the progression of atherosclerotic disease. On appropriate oral medications.  Her log shows good blood pressure control and she may be able to back off on some of her medications going forward at the discretion of her primary care provider.

## 2019-08-08 NOTE — Assessment & Plan Note (Signed)
Status post left to right retropharyngeal common carotid artery to common carotid artery bypass and has done quite well.  Basically 2 weeks out from surgery and may resume all normal activities as tolerated.  She will continue Plavix for minimum of 3 months.  I will plan to see her back in 3 months with a duplex for follow-up.  Duplex done today shows the bypass to be widely patent with good flow and mild disease in the internal carotid arteries distal to the bypass.

## 2019-08-08 NOTE — Progress Notes (Signed)
Patient ID: Martha Peterson, female   DOB: [birth year], 79 y.o.   MRN: [id number]  Chief Complaint  Patient presents with  . Follow-up    ultrasound follow up     HPI Martha Peterson is a 79 y.o. female.  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  She has done quite well.  She spent a few extra days in the hospital due to some low blood pressure but has kept a blood pressure log and by enlarge her blood pressures have been quite good mostly on the normal and some low normal range.  She met with her primary care provider is scheduled to meet with him again in a couple of weeks to discuss whether or not they can back off any blood pressure medications.  Her swallowing has returned to nearly normal.  She still has some hoarseness.  No focal neurologic deficits.  Her neck swelling and bruising are much improved and almost completely gone   Past Medical History:  Diagnosis Date  . Arthritis   . Asthma   . COPD (chronic obstructive pulmonary disease) ([place])   . Diverticulitis   . Gout   . Hyperlipidemia   . Hypertension   . Hypothyroidism   . 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 Microhematuria   . Mild mitral regurgitation   . Mild tricuspid regurgitation   . Osteopenia   . Pulmonary fibrosis ([place])   . Urinary incontinence     Past Surgical History:  Procedure Laterality Date  . ABDOMINAL HYSTERECTOMY    . ANKLE SURGERY Left   . APPENDECTOMY    . CAROTID PTA/STENT INTERVENTION N/A 07/17/2019   Procedure: CAROTID PTA/STENT INTERVENTION;  Surgeon: Algernon Huxley, MD;  Location: [facility];  Service: Cardiovascular;  Laterality: N/A;  . CAROTID-SUBCLAVIAN BYPASS GRAFT N/A 07/26/2019   Procedure: BYPASS GRAFT CAROTID-SUBCLAVIAN ( CAROTID TO CAROTID BYPASS);  Surgeon: Algernon Huxley, MD;  Location: [facility];  Service: Vascular;  Laterality: N/A;   . CHOLECYSTECTOMY    . COLONOSCOPY WITH PROPOFOL N/A 12/04/2014   Procedure: COLONOSCOPY WITH PROPOFOL;  Surgeon: Lollie Sails, MD;  Location: [facility];  Service: Endoscopy;  Laterality: N/A;  . FRACTURE SURGERY     Femur Fx; LT Ankle Pinning  . JOINT REPLACEMENT     RT TKR  . Patial Thyroid Resection        Allergies  Allergen Reactions  . Codeine   . Levaquin [Levofloxacin]   . Tape     latex    Current Outpatient Medications  Medication Sig Dispense Refill  . allopurinol (ZYLOPRIM) 100 MG tablet Take 2 tablets (200 mg total) by mouth daily. 180 tablet 1  . ascorbic acid (VITAMIN C) 500 MG tablet Take 500 mg by mouth daily.    Marland Kitchen aspirin EC 81 MG tablet Take 81 mg by mouth daily.    Marland Kitchen atorvastatin (LIPITOR) 10 MG tablet Take 1 tablet (10 mg total) by mouth daily at 6 PM. 90 tablet 1  . carvedilol (COREG) 25 MG tablet Take 0.5 tablets (12.5 mg total) by mouth 2 (two) times daily with a meal. 90 tablet 1  . cholecalciferol (VITAMIN D) 400 UNITS TABS tablet Take 1,000 Units by mouth.    . cloNIDine (CATAPRES) 0.1 MG tablet Take 1 tablet (0.1 mg total) by mouth 2 (two) times daily. 180 tablet 1  .  clopidogrel (PLAVIX) 75 MG tablet Take 1 tablet (75 mg total) by mouth daily. 30 tablet 11  . diclofenac sodium (VOLTAREN) 1 % GEL Apply 3 grams to 3 large joints, up to 3 times daily as needed. 3 Tube 3  . furosemide (LASIX) 40 MG tablet Take one tablet by mouth two times daily 180 tablet 1  . Ginger, Zingiber officinalis, (GINGER PO) Take by mouth.    Marland Kitchen ibuprofen (ADVIL) 600 MG tablet Take 1 tablet (600 mg total) by mouth every 8 (eight) hours as needed. 90 tablet 1  . ketorolac (TORADOL) 30 MG/ML injection ketorolac 30 mg/mL injection syringe  Inject 1 mL by intramuscular route.    Marland Kitchen levocetirizine (XYZAL) 5 MG tablet TAKE 1 TABLET EVERY DAY  FOR  ALLERGIES 90 tablet 1  . levothyroxine (SYNTHROID) 137 MCG tablet Take 1 tablet (137 mcg total) by mouth daily before breakfast.  90 tablet 1  . losartan-hydrochlorothiazide (HYZAAR) 100-25 MG tablet Take 1 tablet by mouth daily. 90 tablet 1  . magnesium oxide (MAG-OX) 400 MG tablet Take 400 mg by mouth daily.     . meclizine (ANTIVERT) 12.5 MG tablet Take 12.5 mg by mouth as needed.     . Misc Natural Products (TART CHERRY ADVANCED PO) Take by mouth daily.    . Multiple Vitamins-Minerals (PRESERVISION AREDS 2 PO) Take by mouth 2 (two) times daily.    Marland Kitchen omega-3 acid ethyl esters (LOVAZA) 1 G capsule Take 1 g by mouth 2 (two) times daily.    Marland Kitchen omeprazole (PRILOSEC) 20 MG capsule Take one capsule by mouth two times daily 180 capsule 1  . oxyCODONE (OXY IR/ROXICODONE) 5 MG immediate release tablet Take 1 tablet (5 mg total) by mouth every 6 (six) hours as needed for severe pain. 28 tablet 0  . potassium chloride (KLOR-CON) 10 MEQ tablet Take 1 tablet (10 mEq total) by mouth daily. 90 tablet 1  . predniSONE (DELTASONE) 5 MG tablet Take 2 tablets (10 mg total) by mouth daily with breakfast. 180 tablet 1  . tiZANidine (ZANAFLEX) 4 MG tablet Take 1 tablet (4 mg total) by mouth 2 (two) times daily. 90 tablet 0  . traMADol (ULTRAM) 50 MG tablet Take 1 tablet (50 mg total) by mouth every 8 (eight) hours as needed. 90 tablet 1  . TURMERIC PO Take by mouth daily.    . vitamin B-12 (CYANOCOBALAMIN) 100 MCG tablet     . vitamin E 400 UNIT capsule Take 400 Units by mouth 2 (two) times daily.      No current facility-administered medications for this visit.        Physical Exam BP (!) 125/55 (BP Location: Left Arm)   Pulse (!) 56   Resp 16   Ht '5\' 3"'  (1.6 m)   Wt 198 lb (89.8 kg)   BMI 35.07 kg/m  Gen:  WD/WN, NAD no focal neurologic deficits Skin: incision C/D/I neck swelling and bruising have almost completely resolved at this point.     Assessment/Plan:  Essential hypertension blood pressure control important in reducing the progression of atherosclerotic disease. On appropriate oral medications.  Her log shows good  blood pressure control and she may be able to back off on some of her medications going forward at the discretion of her primary care provider.    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  This should be corrected by  the carotid to carotid bypass with ligation of the more proximal common carotid artery  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HCC) Status post left to right retropharyngeal common carotid artery to common carotid artery bypass and has done quite well.  Basically 2 weeks out from surgery and may resume all normal activities as tolerated.  She will continue Plavix for minimum of 3 months.  I will plan to see her back in 3 months with a duplex for follow-up.  Duplex done today shows the bypass to be widely patent with good flow and mild disease in the internal carotid arteries distal to the bypass.      Leotis Pain 08/08/2019, 2:44 PM   This note was created with Dragon medical transcription system.  Any errors from dictation are unintentional.

## 2019-08-10 DIAGNOSIS — J449 Chronic obstructive pulmonary disease, unspecified: Secondary | ICD-10-CM | POA: Diagnosis not present

## 2019-08-10 DIAGNOSIS — J8410 Pulmonary fibrosis, unspecified: Secondary | ICD-10-CM | POA: Diagnosis not present

## 2019-08-10 DIAGNOSIS — I10 Essential (primary) hypertension: Secondary | ICD-10-CM | POA: Diagnosis not present

## 2019-08-10 DIAGNOSIS — I081 Rheumatic disorders of both mitral and tricuspid valves: Secondary | ICD-10-CM | POA: Diagnosis not present

## 2019-08-10 DIAGNOSIS — M109 Gout, unspecified: Secondary | ICD-10-CM | POA: Diagnosis not present

## 2019-08-10 DIAGNOSIS — Z48812 Encounter for surgical aftercare following surgery on the circulatory system: Secondary | ICD-10-CM | POA: Diagnosis not present

## 2019-08-10 DIAGNOSIS — M19041 Primary osteoarthritis, right hand: Secondary | ICD-10-CM | POA: Diagnosis not present

## 2019-08-10 DIAGNOSIS — M1612 Unilateral primary osteoarthritis, left hip: Secondary | ICD-10-CM | POA: Diagnosis not present

## 2019-08-10 DIAGNOSIS — M19042 Primary osteoarthritis, left hand: Secondary | ICD-10-CM | POA: Diagnosis not present

## 2019-08-15 DIAGNOSIS — I081 Rheumatic disorders of both mitral and tricuspid valves: Secondary | ICD-10-CM | POA: Diagnosis not present

## 2019-08-15 DIAGNOSIS — M19041 Primary osteoarthritis, right hand: Secondary | ICD-10-CM | POA: Diagnosis not present

## 2019-08-15 DIAGNOSIS — Z48812 Encounter for surgical aftercare following surgery on the circulatory system: Secondary | ICD-10-CM | POA: Diagnosis not present

## 2019-08-15 DIAGNOSIS — J8410 Pulmonary fibrosis, unspecified: Secondary | ICD-10-CM | POA: Diagnosis not present

## 2019-08-15 DIAGNOSIS — M19042 Primary osteoarthritis, left hand: Secondary | ICD-10-CM | POA: Diagnosis not present

## 2019-08-15 DIAGNOSIS — M1612 Unilateral primary osteoarthritis, left hip: Secondary | ICD-10-CM | POA: Diagnosis not present

## 2019-08-15 DIAGNOSIS — I10 Essential (primary) hypertension: Secondary | ICD-10-CM | POA: Diagnosis not present

## 2019-08-15 DIAGNOSIS — J449 Chronic obstructive pulmonary disease, unspecified: Secondary | ICD-10-CM | POA: Diagnosis not present

## 2019-08-15 DIAGNOSIS — M109 Gout, unspecified: Secondary | ICD-10-CM | POA: Diagnosis not present

## 2019-08-18 ENCOUNTER — Other Ambulatory Visit: Payer: Self-pay

## 2019-08-18 ENCOUNTER — Encounter: Payer: Self-pay | Admitting: Cardiology

## 2019-08-18 ENCOUNTER — Ambulatory Visit (INDEPENDENT_AMBULATORY_CARE_PROVIDER_SITE_OTHER): Payer: Medicare HMO | Admitting: Cardiology

## 2019-08-18 VITALS — BP 120/60 | HR 57 | Ht 63.0 in | Wt 199.8 lb

## 2019-08-18 DIAGNOSIS — I10 Essential (primary) hypertension: Secondary | ICD-10-CM | POA: Diagnosis not present

## 2019-08-18 DIAGNOSIS — I6523 Occlusion and stenosis of bilateral carotid arteries: Secondary | ICD-10-CM

## 2019-08-18 DIAGNOSIS — I5020 Unspecified systolic (congestive) heart failure: Secondary | ICD-10-CM

## 2019-08-18 NOTE — Progress Notes (Signed)
Cardiology Office Note:    Date:  08/18/2019   ID:  Martha Peterson, DOB 07-Dec-1940, MRN [id number]  PCP:  [name], MD  Cardiologist:  No primary care provider on file.  Electrophysiologist:  None   Referring MD: [name], MD   Chief Complaint  Patient presents with  . Other    F/u hospital/2 month f/u lexi 07/10/19. Meds reviewed verbally with pt.    History of Present Illness:    Martha Peterson is a 79 y.o. female with a hx of hypertension, hyperlipidemia, carotid stenosis who presents for follow-up.  She was last seen due to diastolic dysfunction on echocardiogram.  Patient has a history of mitral regurgitation being followed periodically with echocardiogram.  Last outside echocardiogram obtained on 05/26/2019, showed mildly reduced EF of 45%, impaired relaxation/grade 1 diastolic dysfunction, moderate concentric LVH, trivial MR, TR, aortic sclerosis no stenosis.  She denies any symptoms of chest pain or shortness of breath at rest or with activity.  She takes Coreg, losartan, HCTZ.  She has a known history of carotid stenosis with carotid bruit.CT angio of the neck showed high-grade calcific stenosis innominate artery, flow-limiting stenosis in the right carotid system.  There is sclerosis with calcification in the carotid bifurcation bilaterally without significant stenosis.  She is seeing vascular surgery for carotid artery disease.  She takes a baby aspirin and Lipitor 10 mg.  Has a history of liver abnormality hence the low-dose statin.  Lexiscan myocardial perfusion imaging stress test was ordered to evaluate ischemia due to mildly reduced ejection fraction.  Patient states doing okay with no new complaints or concerns.   Past Medical History:  Diagnosis Date  . Arthritis   . Asthma   . COPD (chronic obstructive pulmonary disease) ([place])   . Diverticulitis   . Gout   . Hyperlipidemia   . Hypertension   . Hypothyroidism   . 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 Microhematuria   . Mild mitral regurgitation   . Mild tricuspid regurgitation   . Osteopenia   . Pulmonary fibrosis ([place])   . Urinary incontinence     Past Surgical History:  Procedure Laterality Date  . ABDOMINAL HYSTERECTOMY    . ANKLE SURGERY Left   . APPENDECTOMY    . CAROTID PTA/STENT INTERVENTION N/A 07/17/2019   Procedure: CAROTID PTA/STENT INTERVENTION;  Surgeon: Algernon Huxley, MD;  Location: [facility];  Service: Cardiovascular;  Laterality: N/A;  . CAROTID-SUBCLAVIAN BYPASS GRAFT N/A 07/26/2019   Procedure: BYPASS GRAFT CAROTID-SUBCLAVIAN ( CAROTID TO CAROTID BYPASS);  Surgeon: Algernon Huxley, MD;  Location: [facility];  Service: Vascular;  Laterality: N/A;  . CHOLECYSTECTOMY    . COLONOSCOPY WITH PROPOFOL N/A 12/04/2014   Procedure: COLONOSCOPY WITH PROPOFOL;  Surgeon: Lollie Sails, MD;  Location: [facility];  Service: Endoscopy;  Laterality: N/A;  . FRACTURE SURGERY     Femur Fx; LT Ankle Pinning  . JOINT REPLACEMENT     RT TKR  . Patial Thyroid Resection      Current Medications: Current Meds  Medication Sig  . allopurinol (ZYLOPRIM) 100 MG tablet Take 2 tablets (200 mg total) by mouth daily.  Marland Kitchen ascorbic acid (VITAMIN C) 500 MG tablet Take 500 mg by mouth daily.  Marland Kitchen aspirin EC 81 MG tablet Take 81 mg by mouth daily.  Marland Kitchen atorvastatin (LIPITOR) 10 MG tablet Take 1 tablet (10 mg total) by mouth daily at 6 PM.  . carvedilol (COREG)  25 MG tablet Take 0.5 tablets (12.5 mg total) by mouth 2 (two) times daily with a meal.  . cholecalciferol (VITAMIN D) 400 UNITS TABS tablet Take 1,000 Units by mouth.  . cloNIDine (CATAPRES) 0.1 MG tablet Take 1 tablet (0.1 mg total) by mouth 2 (two) times daily.  . clopidogrel (PLAVIX) 75 MG tablet Take 1 tablet (75 mg total) by mouth daily.  . diclofenac sodium (VOLTAREN) 1 % GEL Apply 3 grams to 3 large joints, up to 3 times daily as needed.  . furosemide (LASIX) 40 MG tablet Take one tablet by  mouth two times daily  . Ginger, Zingiber officinalis, (GINGER PO) Take by mouth.  Marland Kitchen ibuprofen (ADVIL) 600 MG tablet Take 1 tablet (600 mg total) by mouth every 8 (eight) hours as needed.  Marland Kitchen ketorolac (TORADOL) 30 MG/ML injection ketorolac 30 mg/mL injection syringe  Inject 1 mL by intramuscular route.  Marland Kitchen levocetirizine (XYZAL) 5 MG tablet TAKE 1 TABLET EVERY DAY  FOR  ALLERGIES  . levothyroxine (SYNTHROID) 137 MCG tablet Take 1 tablet (137 mcg total) by mouth daily before breakfast.  . losartan-hydrochlorothiazide (HYZAAR) 100-25 MG tablet Take 1 tablet by mouth daily.  . magnesium oxide (MAG-OX) 400 MG tablet Take 400 mg by mouth daily.   . meclizine (ANTIVERT) 12.5 MG tablet Take 12.5 mg by mouth as needed.   . Misc Natural Products (TART CHERRY ADVANCED PO) Take by mouth daily.  . Multiple Vitamins-Minerals (PRESERVISION AREDS 2 PO) Take by mouth 2 (two) times daily.  Marland Kitchen omega-3 acid ethyl esters (LOVAZA) 1 G capsule Take 1 g by mouth 2 (two) times daily.  Marland Kitchen omeprazole (PRILOSEC) 20 MG capsule Take one capsule by mouth two times daily  . oxyCODONE (OXY IR/ROXICODONE) 5 MG immediate release tablet Take 1 tablet (5 mg total) by mouth every 6 (six) hours as needed for severe pain.  . potassium chloride (KLOR-CON) 10 MEQ tablet Take 1 tablet (10 mEq total) by mouth daily.  . predniSONE (DELTASONE) 5 MG tablet Take 2 tablets (10 mg total) by mouth daily with breakfast. (Patient taking differently: Take 10 mg by mouth as needed. )  . tiZANidine (ZANAFLEX) 4 MG tablet Take 1 tablet (4 mg total) by mouth 2 (two) times daily.  . traMADol (ULTRAM) 50 MG tablet Take 1 tablet (50 mg total) by mouth every 8 (eight) hours as needed.  . TURMERIC PO Take by mouth daily.  . vitamin B-12 (CYANOCOBALAMIN) 100 MCG tablet   . vitamin E 400 UNIT capsule Take 400 Units by mouth 2 (two) times daily.      Allergies:   Codeine, Levaquin [levofloxacin], and Tape   Social History   Socioeconomic History  .  Marital status: Married    Spouse name: Jeneen Rinks  . Number of children: Not on file  . Years of education: Not on file  . Highest education level: Not on file  Occupational History  . Not on file  Tobacco Use  . Smoking status: Former Smoker    Years: 1.00    Types: Cigarettes  . Smokeless tobacco: Never Used  Substance and Sexual Activity  . Alcohol use: No  . Drug use: No  . Sexual activity: Not Currently  Other Topics Concern  . Not on file  Social History Narrative   Lives at home with husband in private residence   Social Determinants of Health   Financial Resource Strain:   . Difficulty of Paying Living Expenses: Not on file  Food Insecurity:   .  Worried About Charity fundraiser in the Last Year: Not on file  . Ran Out of Food in the Last Year: Not on file  Transportation Needs:   . Lack of Transportation (Medical): Not on file  . Lack of Transportation (Non-Medical): Not on file  Physical Activity:   . Days of Exercise per Week: Not on file  . Minutes of Exercise per Session: Not on file  Stress:   . Feeling of Stress : Not on file  Social Connections:   . Frequency of Communication with Friends and Family: Not on file  . Frequency of Social Gatherings with Friends and Family: Not on file  . Attends Religious Services: Not on file  . Active Member of Clubs or Organizations: Not on file  . Attends Archivist Meetings: Not on file  . Marital Status: Not on file     Family History: The patient's family history includes Anxiety disorder in her daughter; Arthritis in her daughter; Depression in her daughter; Diabetes in her daughter, father, and son; Heart Problems in her father, mother, and sister; Hypertension in her son; Lupus in her sister; Neuropathy in her daughter; Thyroid disease in her sister.  ROS:   Please see the history of present illness.     All other systems reviewed and are negative.  EKGs/Labs/Other Studies Reviewed:    The following  studies were reviewed today: echocardiogram obtained on 05/26/2019,  showed mildly reduced EF of 45%,  impaired relaxation/grade 1 diastolic dysfunction,  moderate concentric LVH, trivial MR, TR, aortic sclerosis no stenosis  EKG:  EKG is  ordered today.  The ekg ordered today demonstrates sinus bradycardia, heart rate 57.  Recent Labs: 02/07/2019: ALT 27 04/14/2019: TSH 2.700 07/29/2019: Hemoglobin 9.4; Platelets 110 07/31/2019: BUN 25; Creatinine, Ser 0.79; Magnesium 1.7; Potassium 3.5; Sodium 134  Recent Lipid Panel    Component Value Date/Time   CHOL 133 02/07/2019 1121   TRIG 114 02/07/2019 1121   HDL 67 02/07/2019 1121   LDLCALC 43 02/07/2019 1121    Physical Exam:    VS:  BP 120/60 (BP Location: Left Arm, Patient Position: Sitting, Cuff Size: Large)   Pulse (!) 57   Ht 5\' 3"  (1.6 m)   Wt 199 lb 12 oz (90.6 kg)   SpO2 98%   BMI 35.38 kg/m     Wt Readings from Last 3 Encounters:  08/18/19 199 lb 12 oz (90.6 kg)  08/08/19 198 lb (89.8 kg)  08/02/19 193 lb (87.5 kg)     GEN:  Well nourished, well developed in no acute distress HEENT: Normal NECK: No JVD; bilateral carotid bruits noted LYMPHATICS: No lymphadenopathy CARDIAC: RRR, 2/6 systolic murmur noted, RESPIRATORY:  Clear to auscultation without rales, wheezing or rhonchi  ABDOMEN: Soft, non-tender, non-distended MUSCULOSKELETAL: Trace to 1+ edema; No deformity  SKIN: Warm and dry NEUROLOGIC:  Alert and oriented x 3 PSYCHIATRIC:  Normal affect   ASSESSMENT:    1. Heart failure with reduced ejection fraction ([place])   2. Essential hypertension   3. Bilateral carotid artery stenosis    PLAN:    In order of problems listed above:  1. Mildly reduced ejection fraction with EF 45% on outside echocardiogram.  Grade 1 diastolic dysfunction.  Lexiscan stress test was deemed low risk with no evidence for ischemia.  Ejection fraction on nuclear stress test was normal.  Patient is without symptoms and on optimal  cardiac therapy.  Continue Coreg 12.5 twice daily, losartan 100 daily.  Takes  Lasix as needed.  In light of the normal ejection fraction on nuclear stress test, we will repeat echocardiogram to confirm ejection fraction since outside study noted a mildly reduced EF.  2. History of hypertension, blood pressure well controlled.  Continue current BP meds. 3. History of carotid artery disease, continue aspirin and Lipitor.  Follow-up after echo  to evaluate EF.  This note was generated in part or whole with voice recognition software. Voice recognition is usually quite accurate but there are transcription errors that can and very often do occur. I apologize for any typographical errors that were not detected and corrected.  Medication Adjustments/Labs and Tests Ordered: Current medicines are reviewed at length with the patient today.  Concerns regarding medicines are outlined above.  No orders of the defined types were placed in this encounter.  No orders of the defined types were placed in this encounter.   There are no Patient Instructions on file for this visit.   Signed, Kate Sable, MD  08/18/2019 1:26 PM    [place]

## 2019-08-18 NOTE — Patient Instructions (Signed)
Medication Instructions:  Your physician recommends that you continue on your current medications as directed. Please refer to the Current Medication list given to you today.  *If you need a refill on your cardiac medications before your next appointment, please call your pharmacy*   Lab Work: None If you have labs (blood work) drawn today and your tests are completely normal, you will receive your results only by: Marland Kitchen MyChart Message (if you have MyChart) OR . A paper copy in the mail If you have any lab test that is abnormal or we need to change your treatment, we will call you to review the results.   Testing/Procedures: Your physician has requested that you have an echocardiogram. Echocardiography is a painless test that uses sound waves to create images of your heart. It provides your doctor with information about the size and shape of your heart and how well your heart's chambers and valves are working. This procedure takes approximately one hour. There are no restrictions for this procedure.     Follow-Up: At [facility], you and your health needs are our priority.  As part of our continuing mission to provide you with exceptional heart care, we have created designated Provider Care Teams.  These Care Teams include your primary Cardiologist (physician) and Advanced Practice Providers (APPs -  Physician Assistants and Nurse Practitioners) who all work together to provide you with the care you need, when you need it.  We recommend signing up for the patient portal called "MyChart".  Sign up information is provided on this After Visit Summary.  MyChart is used to connect with patients for Virtual Visits (Telemedicine).  Patients are able to view lab/test results, encounter notes, upcoming appointments, etc.  Non-urgent messages can be sent to your provider as well.   To learn more about what you can do with MyChart, go to [email or web address].    Your next appointment:   After echo    The format for your next appointment:   In Person  Provider:    You may see [place] or one of the following Advanced Practice Providers on your designated Care Team:    Murray Hodgkins, NP  Christell Faith, PA-C  Marrianne Mood, PA-C    Other Instructions  Echocardiogram An echocardiogram is a procedure that uses painless sound waves (ultrasound) to produce an image of the heart. Images from an echocardiogram can provide important information about:  Signs of coronary artery disease (CAD).  Aneurysm detection. An aneurysm is a weak or damaged part of an artery wall that bulges out from the normal force of blood pumping through the body.  Heart size and shape. Changes in the size or shape of the heart can be associated with certain conditions, including heart failure, aneurysm, and CAD.  Heart muscle function.  Heart valve function.  Signs of a past heart attack.  Fluid buildup around the heart.  Thickening of the heart muscle.  A tumor or infectious growth around the heart valves. Tell a health care provider about:  Any allergies you have.  All medicines you are taking, including vitamins, herbs, eye drops, creams, and over-the-counter medicines.  Any blood disorders you have.  Any surgeries you have had.  Any medical conditions you have.  Whether you are pregnant or may be pregnant. What are the risks? Generally, this is a safe procedure. However, problems may occur, including:  Allergic reaction to dye (contrast) that may be used during the procedure. What happens before the procedure? No specific preparation  is needed. You may eat and drink normally. What happens during the procedure?   An IV tube may be inserted into one of your veins.  You may receive contrast through this tube. A contrast is an injection that improves the quality of the pictures from your heart.  A gel will be applied to your chest.  A wand-like tool (transducer) will be  moved over your chest. The gel will help to transmit the sound waves from the transducer.  The sound waves will harmlessly bounce off of your heart to allow the heart images to be captured in real-time motion. The images will be recorded on a computer. The procedure may vary among health care providers and hospitals. What happens after the procedure?  You may return to your normal, everyday life, including diet, activities, and medicines, unless your health care provider tells you not to do that. Summary  An echocardiogram is a procedure that uses painless sound waves (ultrasound) to produce an image of the heart.  Images from an echocardiogram can provide important information about the size and shape of your heart, heart muscle function, heart valve function, and fluid buildup around your heart.  You do not need to do anything to prepare before this procedure. You may eat and drink normally.  After the echocardiogram is completed, you may return to your normal, everyday life, unless your health care provider tells you not to do that. This information is not intended to replace advice given to you by your health care provider. Make sure you discuss any questions you have with your health care provider. Document Revised: 09/22/2018 Document Reviewed: 07/04/2016 Elsevier Patient Education  [employer].

## 2019-08-21 DIAGNOSIS — M109 Gout, unspecified: Secondary | ICD-10-CM | POA: Diagnosis not present

## 2019-08-21 DIAGNOSIS — M19042 Primary osteoarthritis, left hand: Secondary | ICD-10-CM | POA: Diagnosis not present

## 2019-08-21 DIAGNOSIS — J8410 Pulmonary fibrosis, unspecified: Secondary | ICD-10-CM | POA: Diagnosis not present

## 2019-08-21 DIAGNOSIS — I081 Rheumatic disorders of both mitral and tricuspid valves: Secondary | ICD-10-CM | POA: Diagnosis not present

## 2019-08-21 DIAGNOSIS — M19041 Primary osteoarthritis, right hand: Secondary | ICD-10-CM | POA: Diagnosis not present

## 2019-08-21 DIAGNOSIS — I10 Essential (primary) hypertension: Secondary | ICD-10-CM | POA: Diagnosis not present

## 2019-08-21 DIAGNOSIS — M1612 Unilateral primary osteoarthritis, left hip: Secondary | ICD-10-CM | POA: Diagnosis not present

## 2019-08-21 DIAGNOSIS — Z48812 Encounter for surgical aftercare following surgery on the circulatory system: Secondary | ICD-10-CM | POA: Diagnosis not present

## 2019-08-21 DIAGNOSIS — J449 Chronic obstructive pulmonary disease, unspecified: Secondary | ICD-10-CM | POA: Diagnosis not present

## 2019-08-23 DIAGNOSIS — J8410 Pulmonary fibrosis, unspecified: Secondary | ICD-10-CM | POA: Diagnosis not present

## 2019-08-23 DIAGNOSIS — M19042 Primary osteoarthritis, left hand: Secondary | ICD-10-CM | POA: Diagnosis not present

## 2019-08-23 DIAGNOSIS — M1612 Unilateral primary osteoarthritis, left hip: Secondary | ICD-10-CM | POA: Diagnosis not present

## 2019-08-23 DIAGNOSIS — J449 Chronic obstructive pulmonary disease, unspecified: Secondary | ICD-10-CM | POA: Diagnosis not present

## 2019-08-23 DIAGNOSIS — I081 Rheumatic disorders of both mitral and tricuspid valves: Secondary | ICD-10-CM | POA: Diagnosis not present

## 2019-08-23 DIAGNOSIS — Z48812 Encounter for surgical aftercare following surgery on the circulatory system: Secondary | ICD-10-CM | POA: Diagnosis not present

## 2019-08-23 DIAGNOSIS — I10 Essential (primary) hypertension: Secondary | ICD-10-CM | POA: Diagnosis not present

## 2019-08-23 DIAGNOSIS — M19041 Primary osteoarthritis, right hand: Secondary | ICD-10-CM | POA: Diagnosis not present

## 2019-08-23 DIAGNOSIS — M109 Gout, unspecified: Secondary | ICD-10-CM | POA: Diagnosis not present

## 2019-08-24 DIAGNOSIS — J8410 Pulmonary fibrosis, unspecified: Secondary | ICD-10-CM | POA: Diagnosis not present

## 2019-08-24 DIAGNOSIS — M109 Gout, unspecified: Secondary | ICD-10-CM | POA: Diagnosis not present

## 2019-08-24 DIAGNOSIS — M1612 Unilateral primary osteoarthritis, left hip: Secondary | ICD-10-CM | POA: Diagnosis not present

## 2019-08-24 DIAGNOSIS — M19042 Primary osteoarthritis, left hand: Secondary | ICD-10-CM | POA: Diagnosis not present

## 2019-08-24 DIAGNOSIS — M19041 Primary osteoarthritis, right hand: Secondary | ICD-10-CM | POA: Diagnosis not present

## 2019-08-24 DIAGNOSIS — I10 Essential (primary) hypertension: Secondary | ICD-10-CM | POA: Diagnosis not present

## 2019-08-24 DIAGNOSIS — J449 Chronic obstructive pulmonary disease, unspecified: Secondary | ICD-10-CM | POA: Diagnosis not present

## 2019-08-24 DIAGNOSIS — I081 Rheumatic disorders of both mitral and tricuspid valves: Secondary | ICD-10-CM | POA: Diagnosis not present

## 2019-08-24 DIAGNOSIS — Z48812 Encounter for surgical aftercare following surgery on the circulatory system: Secondary | ICD-10-CM | POA: Diagnosis not present

## 2019-08-28 DIAGNOSIS — M19041 Primary osteoarthritis, right hand: Secondary | ICD-10-CM | POA: Diagnosis not present

## 2019-08-28 DIAGNOSIS — J8410 Pulmonary fibrosis, unspecified: Secondary | ICD-10-CM | POA: Diagnosis not present

## 2019-08-28 DIAGNOSIS — Z48812 Encounter for surgical aftercare following surgery on the circulatory system: Secondary | ICD-10-CM | POA: Diagnosis not present

## 2019-08-28 DIAGNOSIS — I081 Rheumatic disorders of both mitral and tricuspid valves: Secondary | ICD-10-CM | POA: Diagnosis not present

## 2019-08-28 DIAGNOSIS — M1612 Unilateral primary osteoarthritis, left hip: Secondary | ICD-10-CM | POA: Diagnosis not present

## 2019-08-28 DIAGNOSIS — M19042 Primary osteoarthritis, left hand: Secondary | ICD-10-CM | POA: Diagnosis not present

## 2019-08-28 DIAGNOSIS — M109 Gout, unspecified: Secondary | ICD-10-CM | POA: Diagnosis not present

## 2019-08-28 DIAGNOSIS — I10 Essential (primary) hypertension: Secondary | ICD-10-CM | POA: Diagnosis not present

## 2019-08-28 DIAGNOSIS — J449 Chronic obstructive pulmonary disease, unspecified: Secondary | ICD-10-CM | POA: Diagnosis not present

## 2019-08-29 ENCOUNTER — Telehealth: Payer: Self-pay

## 2019-08-29 NOTE — Telephone Encounter (Signed)
CONFIRMED AND SCREENED FOR 08-31-19 OV. 

## 2019-08-30 ENCOUNTER — Other Ambulatory Visit: Payer: Self-pay

## 2019-08-30 DIAGNOSIS — M19041 Primary osteoarthritis, right hand: Secondary | ICD-10-CM | POA: Diagnosis not present

## 2019-08-30 DIAGNOSIS — M19042 Primary osteoarthritis, left hand: Secondary | ICD-10-CM | POA: Diagnosis not present

## 2019-08-30 DIAGNOSIS — I10 Essential (primary) hypertension: Secondary | ICD-10-CM | POA: Diagnosis not present

## 2019-08-30 DIAGNOSIS — M109 Gout, unspecified: Secondary | ICD-10-CM | POA: Diagnosis not present

## 2019-08-30 DIAGNOSIS — Z48812 Encounter for surgical aftercare following surgery on the circulatory system: Secondary | ICD-10-CM | POA: Diagnosis not present

## 2019-08-30 DIAGNOSIS — J449 Chronic obstructive pulmonary disease, unspecified: Secondary | ICD-10-CM | POA: Diagnosis not present

## 2019-08-30 DIAGNOSIS — J8410 Pulmonary fibrosis, unspecified: Secondary | ICD-10-CM | POA: Diagnosis not present

## 2019-08-30 DIAGNOSIS — M1612 Unilateral primary osteoarthritis, left hip: Secondary | ICD-10-CM | POA: Diagnosis not present

## 2019-08-30 DIAGNOSIS — I081 Rheumatic disorders of both mitral and tricuspid valves: Secondary | ICD-10-CM | POA: Diagnosis not present

## 2019-08-30 DIAGNOSIS — M153 Secondary multiple arthritis: Secondary | ICD-10-CM

## 2019-08-30 MED ORDER — TIZANIDINE HCL 4 MG PO TABS: 4.0000 mg | ORAL_TABLET | Freq: Two times a day (BID) | ORAL | 0 refills | Status: DC

## 2019-08-31 ENCOUNTER — Ambulatory Visit (INDEPENDENT_AMBULATORY_CARE_PROVIDER_SITE_OTHER): Payer: Medicare HMO | Admitting: Nurse Practitioner

## 2019-08-31 ENCOUNTER — Other Ambulatory Visit: Payer: Self-pay

## 2019-08-31 ENCOUNTER — Encounter: Payer: Self-pay | Admitting: Nurse Practitioner

## 2019-08-31 VITALS — BP 125/55 | HR 60 | Temp 97.1°F | Resp 16 | Ht 63.0 in | Wt 200.0 lb

## 2019-08-31 DIAGNOSIS — M153 Secondary multiple arthritis: Secondary | ICD-10-CM | POA: Diagnosis not present

## 2019-08-31 DIAGNOSIS — N289 Disorder of kidney and ureter, unspecified: Secondary | ICD-10-CM | POA: Diagnosis not present

## 2019-08-31 DIAGNOSIS — D509 Iron deficiency anemia, unspecified: Secondary | ICD-10-CM

## 2019-08-31 DIAGNOSIS — M064 Inflammatory polyarthropathy: Secondary | ICD-10-CM

## 2019-08-31 DIAGNOSIS — I10 Essential (primary) hypertension: Secondary | ICD-10-CM | POA: Diagnosis not present

## 2019-08-31 DIAGNOSIS — Z9889 Other specified postprocedural states: Secondary | ICD-10-CM

## 2019-08-31 MED ORDER — TIZANIDINE HCL 4 MG PO TABS: 4.0000 mg | ORAL_TABLET | Freq: Two times a day (BID) | ORAL | 1 refills | Status: DC

## 2019-08-31 MED ORDER — OXYCODONE HCL 5 MG PO TABS: 5.0000 mg | ORAL_TABLET | Freq: Two times a day (BID) | ORAL | 0 refills | Status: DC | PRN

## 2019-08-31 MED ORDER — CLONIDINE HCL 0.1 MG PO TABS: 0.0500 mg | ORAL_TABLET | Freq: Two times a day (BID) | ORAL | 1 refills | Status: DC

## 2019-08-31 NOTE — Progress Notes (Signed)
[facility] [place], [place] [address]  Internal MEDICINE  Office Visit Note  Patient Name: Martha Peterson  [id number]  [id number]  Date of Service: 09/09/2019  Chief Complaint  Patient presents with  . Hypertension  . Hypothyroidism  . Hyperlipidemia  . Arthritis    The patient is here for follow up visit. She continues to heal from BYPASS GRAFT CAROTID-SUBCLAVIAN ( CAROTID TO CAROTID BYPASS. Bruising is improving. She continues to tire quickly, but this is slowly improving. She has had some low blood pressure readings. Physical therapist concerned about lower blood pressure. Heart rate also a little low. She has no chest pain, chest pressure, or unusual shortness of breath. She is having some pain in surgical site as well as back. She is unable to take tylenol due to abnormal liver functions. Currently unable to take NSAIDs as she is now on plavix.       Current Medication: Outpatient Encounter Medications as of 08/31/2019  Medication Sig Note  . allopurinol (ZYLOPRIM) 100 MG tablet Take 2 tablets (200 mg total) by mouth daily.   Marland Kitchen ascorbic acid (VITAMIN C) 500 MG tablet Take 500 mg by mouth daily.   Marland Kitchen aspirin EC 81 MG tablet Take 81 mg by mouth daily.   Marland Kitchen atorvastatin (LIPITOR) 10 MG tablet Take 1 tablet (10 mg total) by mouth daily at 6 PM.   . carvedilol (COREG) 25 MG tablet Take 0.5 tablets (12.5 mg total) by mouth 2 (two) times daily with a meal.   . cholecalciferol (VITAMIN D) 400 UNITS TABS tablet Take 1,000 Units by mouth.   . cloNIDine (CATAPRES) 0.1 MG tablet Take 0.5 tablets (0.05 mg total) by mouth 2 (two) times daily.   . clopidogrel (PLAVIX) 75 MG tablet Take 1 tablet (75 mg total) by mouth daily.   . diclofenac sodium (VOLTAREN) 1 % GEL Apply 3 grams to 3 large joints, up to 3 times daily as needed.   . furosemide (LASIX) 40 MG tablet Take one tablet by mouth two times daily   . Ginger, Zingiber officinalis, (GINGER PO) Take by mouth.    Marland Kitchen ibuprofen (ADVIL) 600 MG tablet Take 1 tablet (600 mg total) by mouth every 8 (eight) hours as needed.   Marland Kitchen ketorolac (TORADOL) 30 MG/ML injection ketorolac 30 mg/mL injection syringe  Inject 1 mL by intramuscular route.   Marland Kitchen levocetirizine (XYZAL) 5 MG tablet TAKE 1 TABLET EVERY DAY  FOR  ALLERGIES   . levothyroxine (SYNTHROID) 137 MCG tablet Take 1 tablet (137 mcg total) by mouth daily before breakfast.   . losartan-hydrochlorothiazide (HYZAAR) 100-25 MG tablet Take 1 tablet by mouth daily.   . magnesium oxide (MAG-OX) 400 MG tablet Take 400 mg by mouth daily.  08/20/2015: Received from: External Pharmacy  . meclizine (ANTIVERT) 12.5 MG tablet Take 12.5 mg by mouth as needed.    . Misc Natural Products (TART CHERRY ADVANCED PO) Take by mouth daily.   . Multiple Vitamins-Minerals (PRESERVISION AREDS 2 PO) Take by mouth 2 (two) times daily.   Marland Kitchen omega-3 acid ethyl esters (LOVAZA) 1 G capsule Take 1 g by mouth 2 (two) times daily.   Marland Kitchen omeprazole (PRILOSEC) 20 MG capsule Take one capsule by mouth two times daily   . oxyCODONE (OXY IR/ROXICODONE) 5 MG immediate release tablet Take 1 tablet (5 mg total) by mouth 2 (two) times daily as needed for severe pain.   . potassium chloride (KLOR-CON) 10 MEQ tablet Take 1 tablet (10  mEq total) by mouth daily.   . predniSONE (DELTASONE) 5 MG tablet Take 2 tablets (10 mg total) by mouth daily with breakfast. (Patient taking differently: Take 10 mg by mouth as needed. )   . tiZANidine (ZANAFLEX) 4 MG tablet Take 1 tablet (4 mg total) by mouth 2 (two) times daily.   . traMADol (ULTRAM) 50 MG tablet Take 1 tablet (50 mg total) by mouth every 8 (eight) hours as needed.   . TURMERIC PO Take by mouth daily.   . vitamin B-12 (CYANOCOBALAMIN) 100 MCG tablet    . vitamin E 400 UNIT capsule Take 400 Units by mouth 2 (two) times daily.  08/20/2015: Received from: External Pharmacy  . [DISCONTINUED] cloNIDine (CATAPRES) 0.1 MG tablet Take 1 tablet (0.1 mg total) by mouth 2  (two) times daily.   . [DISCONTINUED] oxyCODONE (OXY IR/ROXICODONE) 5 MG immediate release tablet Take 1 tablet (5 mg total) by mouth every 6 (six) hours as needed for severe pain.   . [DISCONTINUED] tiZANidine (ZANAFLEX) 4 MG tablet Take 1 tablet (4 mg total) by mouth 2 (two) times daily.    No facility-administered encounter medications on file as of 08/31/2019.    Surgical History: Past Surgical History:  Procedure Laterality Date  . ABDOMINAL HYSTERECTOMY    . ANKLE SURGERY Left   . APPENDECTOMY    . CAROTID PTA/STENT INTERVENTION N/A 07/17/2019   Procedure: CAROTID PTA/STENT INTERVENTION;  Surgeon: Algernon Huxley, MD;  Location: [facility];  Service: Cardiovascular;  Laterality: N/A;  . CAROTID-SUBCLAVIAN BYPASS GRAFT N/A 07/26/2019   Procedure: BYPASS GRAFT CAROTID-SUBCLAVIAN ( CAROTID TO CAROTID BYPASS);  Surgeon: Algernon Huxley, MD;  Location: [facility];  Service: Vascular;  Laterality: N/A;  . CHOLECYSTECTOMY    . COLONOSCOPY WITH PROPOFOL N/A 12/04/2014   Procedure: COLONOSCOPY WITH PROPOFOL;  Surgeon: Lollie Sails, MD;  Location: [facility];  Service: Endoscopy;  Laterality: N/A;  . FRACTURE SURGERY     Femur Fx; LT Ankle Pinning  . JOINT REPLACEMENT     RT TKR  . Patial Thyroid Resection      Medical History: Past Medical History:  Diagnosis Date  . Arthritis   . Asthma   . COPD (chronic obstructive pulmonary disease) ([place])   . Diverticulitis   . Gout   . Hyperlipidemia   . Hypertension   . Hypothyroidism   . 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 Microhematuria   . Mild mitral regurgitation   . Mild tricuspid regurgitation   . Osteopenia   . Pulmonary fibrosis ([place])   . Urinary incontinence     Family History: Family History  Problem Relation Age of Onset  . Heart Problems Mother   . Heart Problems Father   . Diabetes Father   . Lupus Sister   . Thyroid disease Sister   . Heart Problems Sister   . Hypertension Son    . Diabetes Son        borderline   . Arthritis Daughter        in the left knee   . Diabetes Daughter   . Neuropathy Daughter   . Depression Daughter   . Anxiety disorder Daughter     Social History   Socioeconomic History  . Marital status: Married    Spouse name: Jeneen Rinks  . Number of children: Not on file  . Years of education: Not on file  . Highest education level: Not on file  Occupational History  .  Not on file  Tobacco Use  . Smoking status: Former Smoker    Years: 1.00    Types: Cigarettes  . Smokeless tobacco: Never Used  Substance and Sexual Activity  . Alcohol use: No  . Drug use: No  . Sexual activity: Not Currently  Other Topics Concern  . Not on file  Social History Narrative   Lives at home with husband in private residence   Social Determinants of Health   Financial Resource Strain:   . Difficulty of Paying Living Expenses:   Food Insecurity:   . Worried About Charity fundraiser in the Last Year:   . Arboriculturist in the Last Year:   Transportation Needs:   . Film/video editor (Medical):   Marland Kitchen Lack of Transportation (Non-Medical):   Physical Activity:   . Days of Exercise per Week:   . Minutes of Exercise per Session:   Stress:   . Feeling of Stress :   Social Connections:   . Frequency of Communication with Friends and Family:   . Frequency of Social Gatherings with Friends and Family:   . Attends Religious Services:   . Active Member of Clubs or Organizations:   . Attends Archivist Meetings:   Marland Kitchen Marital Status:   Intimate Partner Violence:   . Fear of Current or Ex-Partner:   . Emotionally Abused:   Marland Kitchen Physically Abused:   . Sexually Abused:       Review of Systems  Constitutional: Positive for activity change and fatigue. Negative for chills and unexpected weight change.  HENT: Positive for trouble swallowing and voice change. Negative for congestion, postnasal drip, rhinorrhea, sneezing and sore throat.         Still has moderate hoarseness after having carotid to carotid bypass surgery last month. Slowly improving.   Respiratory: Positive for shortness of breath. Negative for cough, chest tightness and wheezing.        With exertion.   Cardiovascular: Negative for chest pain and palpitations.       Blood pressure is stable.   Gastrointestinal: Negative for abdominal pain, constipation, diarrhea, nausea and vomiting.  Endocrine: Negative for cold intolerance, heat intolerance, polydipsia and polyuria.  Musculoskeletal: Positive for myalgias and neck pain. Negative for arthralgias, back pain and joint swelling.       Neck tenderness present after carotid bypass surgery 07/26/2019  Skin: Negative for rash.  Allergic/Immunologic: Negative for environmental allergies.  Neurological: Positive for headaches. Negative for dizziness, tremors and numbness.       Gets mild headache for a few moments sometimes after standing up from seated or laying position.   Hematological: Negative for adenopathy. Does not bruise/bleed easily.  Psychiatric/Behavioral: Negative for behavioral problems (Depression), sleep disturbance and suicidal ideas. The patient is not nervous/anxious.     Today's Vitals   08/31/19 0916  BP: (!) 125/55  Pulse: 60  Resp: 16  Temp: (!) 97.1 F (36.2 C)  SpO2: 96%  Weight: 200 lb (90.7 kg)  Height: 5\' 3"  (1.6 m)   Body mass index is 35.43 kg/m.  Physical Exam Vitals and nursing note reviewed.  Constitutional:      General: She is not in acute distress.    Appearance: Normal appearance. She is well-developed. She is not diaphoretic.  HENT:     Head: Normocephalic and atraumatic.     Nose: Nose normal.     Mouth/Throat:     Pharynx: No oropharyngeal exudate.  Eyes:  Pupils: Pupils are equal, round, and reactive to light.  Neck:     Thyroid: No thyromegaly.     Vascular: No JVD.     Trachea: No tracheal deviation.     Comments: Well healing surgical incision from carotid  to carotid bypass Cardiovascular:     Rate and Rhythm: Normal rate and regular rhythm.     Pulses: Normal pulses.     Heart sounds: Normal heart sounds. No murmur. No friction rub. No gallop.   Pulmonary:     Effort: Pulmonary effort is normal. No respiratory distress.     Breath sounds: Normal breath sounds. No wheezing or rales.  Chest:     Chest wall: No tenderness.  Abdominal:     Palpations: Abdomen is soft.  Musculoskeletal:        General: Normal range of motion.     Cervical back: Normal range of motion and neck supple.     Comments: Having severe low back pain, worse with bending and twisting at the waist. Worse with reaching, pushing, pulling, and lifting with the arms.     Lymphadenopathy:     Cervical: No cervical adenopathy.  Skin:    General: Skin is warm and dry.  Neurological:     General: No focal deficit present.     Mental Status: She is alert and oriented to person, place, and time.     Cranial Nerves: No cranial nerve deficit.  Psychiatric:        Behavior: Behavior normal.        Thought Content: Thought content normal.        Judgment: Judgment normal.   Assessment/Plan: 1. S/P carotid endarterectomy BYPASS GRAFT CAROTID-SUBCLAVIAN ( CAROTID TO CAROTID BYPASS. Bruising is improving. Patient continuing to improve overall. Will monitor.   2. Essential hypertension Stable, in fact, a little low. Reduce clonidine 0.1mg  tablets to taking 1/2 tablet twice daily. Continue other blood pressure medication as prescribed  - cloNIDine (CATAPRES) 0.1 MG tablet; Take 0.5 tablets (0.05 mg total) by mouth 2 (two) times daily.  Dispense: 180 tablet; Refill: 1  3. Iron deficiency anemia, unspecified iron deficiency anemia type Check labs to confirm that iron deficiency anemia continuing to improve.   4. Abnormal renal function Check bmp   5. Hypomagnesemia Check magnesium level.  6. Inflammatory polyarthritis ([place]) May tae oxycodone 5mg  tablets twice daily if  needed for severe pain. A single prescription for #45 tablets was sent to her pharmacy.  - oxyCODONE (OXY IR/ROXICODONE) 5 MG immediate release tablet; Take 1 tablet (5 mg total) by mouth 2 (two) times daily as needed for severe pain.  Dispense: 45 tablet; Refill: 0  7. Other secondary osteoarthritis of multiple sites May take tizanidine 4mg  up to twice daily if needed for muscle pain/spasms.  - tiZANidine (ZANAFLEX) 4 MG tablet; Take 1 tablet (4 mg total) by mouth 2 (two) times daily.  Dispense: 90 tablet; Refill: 1  General Counseling: Rand verbalizes understanding of the findings of todays visit and agrees with plan of treatment. I have discussed any further diagnostic evaluation that may be needed or ordered today. We also reviewed her medications today. she has been encouraged to call the office with any questions or concerns that should arise related to todays visit.   Reviewed risks and possible side effects associated with taking opiates, benzodiazepines and other CNS depressants. Combination of these could cause dizziness and drowsiness. Advised patient not to drive or operate machinery when taking these medications, as  patient's and other's life can be at risk and will have consequences. Patient verbalized understanding in this matter. Dependence and abuse for these drugs will be monitored closely. A Controlled substance policy and procedure is on file which allows [facility] to order a urine drug screen test at any visit. Patient understands and agrees with the plan  This patient was seen by [name] FNP Collaboration with Dr [name] as a part of collaborative care agreement  Meds ordered this encounter  Medications  . cloNIDine (CATAPRES) 0.1 MG tablet    Sig: Take 0.5 tablets (0.05 mg total) by mouth 2 (two) times daily.    Dispense:  180 tablet    Refill:  1    Reduced dosing to 1/2 tablet twice daily    Order Specific Question:   Supervising Provider     Answer:   [name] [place]  . oxyCODONE (OXY IR/ROXICODONE) 5 MG immediate release tablet    Sig: Take 1 tablet (5 mg total) by mouth 2 (two) times daily as needed for severe pain.    Dispense:  45 tablet    Refill:  0    Order Specific Question:   Supervising Provider    Answer:   [name] [id number]  . tiZANidine (ZANAFLEX) 4 MG tablet    Sig: Take 1 tablet (4 mg total) by mouth 2 (two) times daily.    Dispense:  90 tablet    Refill:  1    Order Specific Question:   Supervising Provider    Answer:   [name] [id number]    Total time spent: 30 Minutes   Time spent includes review of chart, medications, test results, and follow up plan with the patient.      Dr [name] Internal medicine

## 2019-09-04 DIAGNOSIS — M19042 Primary osteoarthritis, left hand: Secondary | ICD-10-CM | POA: Diagnosis not present

## 2019-09-04 DIAGNOSIS — M19041 Primary osteoarthritis, right hand: Secondary | ICD-10-CM | POA: Diagnosis not present

## 2019-09-04 DIAGNOSIS — J449 Chronic obstructive pulmonary disease, unspecified: Secondary | ICD-10-CM | POA: Diagnosis not present

## 2019-09-04 DIAGNOSIS — M109 Gout, unspecified: Secondary | ICD-10-CM | POA: Diagnosis not present

## 2019-09-04 DIAGNOSIS — I10 Essential (primary) hypertension: Secondary | ICD-10-CM | POA: Diagnosis not present

## 2019-09-04 DIAGNOSIS — M1612 Unilateral primary osteoarthritis, left hip: Secondary | ICD-10-CM | POA: Diagnosis not present

## 2019-09-04 DIAGNOSIS — I081 Rheumatic disorders of both mitral and tricuspid valves: Secondary | ICD-10-CM | POA: Diagnosis not present

## 2019-09-04 DIAGNOSIS — J8410 Pulmonary fibrosis, unspecified: Secondary | ICD-10-CM | POA: Diagnosis not present

## 2019-09-04 DIAGNOSIS — Z48812 Encounter for surgical aftercare following surgery on the circulatory system: Secondary | ICD-10-CM | POA: Diagnosis not present

## 2019-09-07 DIAGNOSIS — J449 Chronic obstructive pulmonary disease, unspecified: Secondary | ICD-10-CM | POA: Diagnosis not present

## 2019-09-07 DIAGNOSIS — M19042 Primary osteoarthritis, left hand: Secondary | ICD-10-CM | POA: Diagnosis not present

## 2019-09-07 DIAGNOSIS — M109 Gout, unspecified: Secondary | ICD-10-CM | POA: Diagnosis not present

## 2019-09-07 DIAGNOSIS — I10 Essential (primary) hypertension: Secondary | ICD-10-CM | POA: Diagnosis not present

## 2019-09-07 DIAGNOSIS — M19041 Primary osteoarthritis, right hand: Secondary | ICD-10-CM | POA: Diagnosis not present

## 2019-09-07 DIAGNOSIS — M1612 Unilateral primary osteoarthritis, left hip: Secondary | ICD-10-CM | POA: Diagnosis not present

## 2019-09-07 DIAGNOSIS — Z48812 Encounter for surgical aftercare following surgery on the circulatory system: Secondary | ICD-10-CM | POA: Diagnosis not present

## 2019-09-07 DIAGNOSIS — I081 Rheumatic disorders of both mitral and tricuspid valves: Secondary | ICD-10-CM | POA: Diagnosis not present

## 2019-09-07 DIAGNOSIS — J8410 Pulmonary fibrosis, unspecified: Secondary | ICD-10-CM | POA: Diagnosis not present

## 2019-09-09 DIAGNOSIS — N289 Disorder of kidney and ureter, unspecified: Secondary | ICD-10-CM | POA: Insufficient documentation

## 2019-09-09 DIAGNOSIS — Z9889 Other specified postprocedural states: Secondary | ICD-10-CM | POA: Insufficient documentation

## 2019-09-09 DIAGNOSIS — N183 Chronic kidney disease, stage 3 unspecified: Secondary | ICD-10-CM | POA: Insufficient documentation

## 2019-09-09 DIAGNOSIS — D509 Iron deficiency anemia, unspecified: Secondary | ICD-10-CM | POA: Insufficient documentation

## 2019-09-12 DIAGNOSIS — M19041 Primary osteoarthritis, right hand: Secondary | ICD-10-CM | POA: Diagnosis not present

## 2019-09-12 DIAGNOSIS — M19042 Primary osteoarthritis, left hand: Secondary | ICD-10-CM | POA: Diagnosis not present

## 2019-09-12 DIAGNOSIS — M109 Gout, unspecified: Secondary | ICD-10-CM | POA: Diagnosis not present

## 2019-09-12 DIAGNOSIS — J449 Chronic obstructive pulmonary disease, unspecified: Secondary | ICD-10-CM | POA: Diagnosis not present

## 2019-09-12 DIAGNOSIS — J8410 Pulmonary fibrosis, unspecified: Secondary | ICD-10-CM | POA: Diagnosis not present

## 2019-09-12 DIAGNOSIS — I10 Essential (primary) hypertension: Secondary | ICD-10-CM | POA: Diagnosis not present

## 2019-09-12 DIAGNOSIS — M1612 Unilateral primary osteoarthritis, left hip: Secondary | ICD-10-CM | POA: Diagnosis not present

## 2019-09-12 DIAGNOSIS — Z48812 Encounter for surgical aftercare following surgery on the circulatory system: Secondary | ICD-10-CM | POA: Diagnosis not present

## 2019-09-12 DIAGNOSIS — I081 Rheumatic disorders of both mitral and tricuspid valves: Secondary | ICD-10-CM | POA: Diagnosis not present

## 2019-09-13 ENCOUNTER — Other Ambulatory Visit: Payer: Self-pay | Admitting: Internal Medicine

## 2019-09-18 DIAGNOSIS — J8410 Pulmonary fibrosis, unspecified: Secondary | ICD-10-CM | POA: Diagnosis not present

## 2019-09-18 DIAGNOSIS — J449 Chronic obstructive pulmonary disease, unspecified: Secondary | ICD-10-CM | POA: Diagnosis not present

## 2019-09-18 DIAGNOSIS — M109 Gout, unspecified: Secondary | ICD-10-CM | POA: Diagnosis not present

## 2019-09-18 DIAGNOSIS — M19041 Primary osteoarthritis, right hand: Secondary | ICD-10-CM | POA: Diagnosis not present

## 2019-09-18 DIAGNOSIS — M19042 Primary osteoarthritis, left hand: Secondary | ICD-10-CM | POA: Diagnosis not present

## 2019-09-18 DIAGNOSIS — I10 Essential (primary) hypertension: Secondary | ICD-10-CM | POA: Diagnosis not present

## 2019-09-18 DIAGNOSIS — I081 Rheumatic disorders of both mitral and tricuspid valves: Secondary | ICD-10-CM | POA: Diagnosis not present

## 2019-09-18 DIAGNOSIS — M1612 Unilateral primary osteoarthritis, left hip: Secondary | ICD-10-CM | POA: Diagnosis not present

## 2019-09-18 DIAGNOSIS — Z48812 Encounter for surgical aftercare following surgery on the circulatory system: Secondary | ICD-10-CM | POA: Diagnosis not present

## 2019-09-20 ENCOUNTER — Other Ambulatory Visit: Payer: Self-pay

## 2019-09-20 DIAGNOSIS — Z8739 Personal history of other diseases of the musculoskeletal system and connective tissue: Secondary | ICD-10-CM

## 2019-09-20 MED ORDER — ALLOPURINOL 100 MG PO TABS: 200.0000 mg | ORAL_TABLET | Freq: Every day | ORAL | 1 refills | Status: DC

## 2019-09-21 ENCOUNTER — Other Ambulatory Visit: Payer: Self-pay

## 2019-09-21 DIAGNOSIS — I10 Essential (primary) hypertension: Secondary | ICD-10-CM | POA: Diagnosis not present

## 2019-09-21 DIAGNOSIS — E039 Hypothyroidism, unspecified: Secondary | ICD-10-CM

## 2019-09-21 DIAGNOSIS — M19042 Primary osteoarthritis, left hand: Secondary | ICD-10-CM | POA: Diagnosis not present

## 2019-09-21 DIAGNOSIS — E876 Hypokalemia: Secondary | ICD-10-CM

## 2019-09-21 DIAGNOSIS — M19041 Primary osteoarthritis, right hand: Secondary | ICD-10-CM | POA: Diagnosis not present

## 2019-09-21 DIAGNOSIS — M1612 Unilateral primary osteoarthritis, left hip: Secondary | ICD-10-CM | POA: Diagnosis not present

## 2019-09-21 DIAGNOSIS — J449 Chronic obstructive pulmonary disease, unspecified: Secondary | ICD-10-CM | POA: Diagnosis not present

## 2019-09-21 DIAGNOSIS — Z48812 Encounter for surgical aftercare following surgery on the circulatory system: Secondary | ICD-10-CM | POA: Diagnosis not present

## 2019-09-21 DIAGNOSIS — J8410 Pulmonary fibrosis, unspecified: Secondary | ICD-10-CM | POA: Diagnosis not present

## 2019-09-21 DIAGNOSIS — M109 Gout, unspecified: Secondary | ICD-10-CM | POA: Diagnosis not present

## 2019-09-21 DIAGNOSIS — I081 Rheumatic disorders of both mitral and tricuspid valves: Secondary | ICD-10-CM | POA: Diagnosis not present

## 2019-09-21 MED ORDER — POTASSIUM CHLORIDE ER 10 MEQ PO TBCR: 10.0000 meq | EXTENDED_RELEASE_TABLET | Freq: Every day | ORAL | 1 refills | Status: DC

## 2019-09-21 MED ORDER — LEVOTHYROXINE SODIUM 137 MCG PO TABS: 137.0000 ug | ORAL_TABLET | Freq: Every day | ORAL | 1 refills | Status: DC

## 2019-09-21 MED ORDER — LEVOCETIRIZINE DIHYDROCHLORIDE 5 MG PO TABS: ORAL_TABLET | ORAL | 1 refills | Status: DC

## 2019-09-25 ENCOUNTER — Other Ambulatory Visit: Payer: Self-pay

## 2019-09-25 DIAGNOSIS — J8410 Pulmonary fibrosis, unspecified: Secondary | ICD-10-CM | POA: Diagnosis not present

## 2019-09-25 DIAGNOSIS — M109 Gout, unspecified: Secondary | ICD-10-CM | POA: Diagnosis not present

## 2019-09-25 DIAGNOSIS — Z48812 Encounter for surgical aftercare following surgery on the circulatory system: Secondary | ICD-10-CM | POA: Diagnosis not present

## 2019-09-25 DIAGNOSIS — M19041 Primary osteoarthritis, right hand: Secondary | ICD-10-CM | POA: Diagnosis not present

## 2019-09-25 DIAGNOSIS — M1612 Unilateral primary osteoarthritis, left hip: Secondary | ICD-10-CM | POA: Diagnosis not present

## 2019-09-25 DIAGNOSIS — I10 Essential (primary) hypertension: Secondary | ICD-10-CM | POA: Diagnosis not present

## 2019-09-25 DIAGNOSIS — I081 Rheumatic disorders of both mitral and tricuspid valves: Secondary | ICD-10-CM | POA: Diagnosis not present

## 2019-09-25 DIAGNOSIS — E039 Hypothyroidism, unspecified: Secondary | ICD-10-CM

## 2019-09-25 DIAGNOSIS — E876 Hypokalemia: Secondary | ICD-10-CM

## 2019-09-25 DIAGNOSIS — J449 Chronic obstructive pulmonary disease, unspecified: Secondary | ICD-10-CM | POA: Diagnosis not present

## 2019-09-25 DIAGNOSIS — M19042 Primary osteoarthritis, left hand: Secondary | ICD-10-CM | POA: Diagnosis not present

## 2019-09-25 DIAGNOSIS — Z8739 Personal history of other diseases of the musculoskeletal system and connective tissue: Secondary | ICD-10-CM

## 2019-09-25 MED ORDER — LEVOCETIRIZINE DIHYDROCHLORIDE 5 MG PO TABS: ORAL_TABLET | ORAL | 1 refills | Status: DC

## 2019-09-25 MED ORDER — LEVOTHYROXINE SODIUM 137 MCG PO TABS: 137.0000 ug | ORAL_TABLET | Freq: Every day | ORAL | 1 refills | Status: DC

## 2019-09-25 MED ORDER — POTASSIUM CHLORIDE ER 10 MEQ PO TBCR: 10.0000 meq | EXTENDED_RELEASE_TABLET | Freq: Every day | ORAL | 1 refills | Status: DC

## 2019-09-25 MED ORDER — ALLOPURINOL 100 MG PO TABS: 200.0000 mg | ORAL_TABLET | Freq: Every day | ORAL | 1 refills | Status: DC

## 2019-09-29 ENCOUNTER — Other Ambulatory Visit: Payer: Self-pay

## 2019-09-29 DIAGNOSIS — E876 Hypokalemia: Secondary | ICD-10-CM

## 2019-09-29 DIAGNOSIS — E039 Hypothyroidism, unspecified: Secondary | ICD-10-CM

## 2019-09-29 DIAGNOSIS — Z8739 Personal history of other diseases of the musculoskeletal system and connective tissue: Secondary | ICD-10-CM

## 2019-09-29 MED ORDER — ALLOPURINOL 100 MG PO TABS: 200.0000 mg | ORAL_TABLET | Freq: Every day | ORAL | 1 refills | Status: DC

## 2019-09-29 MED ORDER — LEVOCETIRIZINE DIHYDROCHLORIDE 5 MG PO TABS: ORAL_TABLET | ORAL | 1 refills | Status: DC

## 2019-09-29 MED ORDER — POTASSIUM CHLORIDE ER 10 MEQ PO TBCR: 10.0000 meq | EXTENDED_RELEASE_TABLET | Freq: Every day | ORAL | 1 refills | Status: DC

## 2019-09-29 MED ORDER — LEVOTHYROXINE SODIUM 137 MCG PO TABS: 137.0000 ug | ORAL_TABLET | Freq: Every day | ORAL | 1 refills | Status: DC

## 2019-10-03 ENCOUNTER — Other Ambulatory Visit: Payer: Self-pay | Admitting: Nurse Practitioner

## 2019-10-03 DIAGNOSIS — D509 Iron deficiency anemia, unspecified: Secondary | ICD-10-CM | POA: Diagnosis not present

## 2019-10-03 DIAGNOSIS — N289 Disorder of kidney and ureter, unspecified: Secondary | ICD-10-CM | POA: Diagnosis not present

## 2019-10-04 LAB — CBC
Hematocrit: 36.2 % (ref 34.0–46.6)
Hemoglobin: 12 g/dL (ref 11.1–15.9)
MCH: 32.9 pg (ref 26.6–33.0)
MCHC: 33.1 g/dL (ref 31.5–35.7)
MCV: 99 fL — ABNORMAL HIGH (ref 79–97)
Platelets: 141 10*3/uL — ABNORMAL LOW (ref 150–450)
RBC: 3.65 x10E6/uL — ABNORMAL LOW (ref 3.77–5.28)
RDW: 12.7 % (ref 11.7–15.4)
WBC: 3.5 10*3/uL (ref 3.4–10.8)

## 2019-10-04 LAB — BASIC METABOLIC PANEL
BUN/Creatinine Ratio: 17 (ref 12–28)
BUN: 18 mg/dL (ref 8–27)
CO2: 25 mmol/L (ref 20–29)
Calcium: 9.7 mg/dL (ref 8.7–10.3)
Chloride: 99 mmol/L (ref 96–106)
Creatinine, Ser: 1.04 mg/dL — ABNORMAL HIGH (ref 0.57–1.00)
GFR calc Af Amer: 59 mL/min/{1.73_m2} — ABNORMAL LOW (ref >59)
GFR calc non Af Amer: 51 mL/min/{1.73_m2} — ABNORMAL LOW (ref >59)
Glucose: 118 mg/dL — ABNORMAL HIGH (ref 65–99)
Potassium: 3.9 mmol/L (ref 3.5–5.2)
Sodium: 139 mmol/L (ref 134–144)

## 2019-10-04 LAB — MAGNESIUM: Magnesium: 1.6 mg/dL (ref 1.6–2.3)

## 2019-10-04 NOTE — Progress Notes (Signed)
Blood count improving. Renal functions sightly abnormal. Discuss at visit 10/16/2019

## 2019-10-05 ENCOUNTER — Other Ambulatory Visit: Payer: Self-pay

## 2019-10-05 ENCOUNTER — Ambulatory Visit (INDEPENDENT_AMBULATORY_CARE_PROVIDER_SITE_OTHER): Payer: Medicare HMO

## 2019-10-05 DIAGNOSIS — I6523 Occlusion and stenosis of bilateral carotid arteries: Secondary | ICD-10-CM

## 2019-10-05 DIAGNOSIS — I5020 Unspecified systolic (congestive) heart failure: Secondary | ICD-10-CM | POA: Diagnosis not present

## 2019-10-09 ENCOUNTER — Ambulatory Visit (INDEPENDENT_AMBULATORY_CARE_PROVIDER_SITE_OTHER): Payer: Medicare HMO | Admitting: Cardiology

## 2019-10-09 ENCOUNTER — Other Ambulatory Visit: Payer: Self-pay

## 2019-10-09 ENCOUNTER — Encounter: Payer: Self-pay | Admitting: Cardiology

## 2019-10-09 VITALS — BP 146/60 | HR 65 | Ht 63.0 in | Wt 198.2 lb

## 2019-10-09 DIAGNOSIS — I5189 Other ill-defined heart diseases: Secondary | ICD-10-CM

## 2019-10-09 DIAGNOSIS — I6523 Occlusion and stenosis of bilateral carotid arteries: Secondary | ICD-10-CM | POA: Diagnosis not present

## 2019-10-09 DIAGNOSIS — I10 Essential (primary) hypertension: Secondary | ICD-10-CM | POA: Diagnosis not present

## 2019-10-09 DIAGNOSIS — I519 Heart disease, unspecified: Secondary | ICD-10-CM | POA: Diagnosis not present

## 2019-10-09 NOTE — Progress Notes (Signed)
Cardiology Office Note:    Date:  10/09/2019   ID:  Martha Peterson, DOB 07/25/1940, MRN [id number]  PCP:  [name], MD  Cardiologist:  No primary care provider on file.  Electrophysiologist:  None   Referring MD: [name], MD   Chief Complaint  Patient presents with  . Other    Follow up post ECHO. Meds reviewed verbally with patient.     History of Present Illness:    Martha Peterson is a 79 y.o. female with a hx of hypertension, hyperlipidemia, carotid stenosis, innominate artery stenosis who presents for follow-up.  Outside echocardiogram did show mildly reduced ejection fraction with EF of 45%.  In my review stress test did not show any ischemia.  Low risk study, EF 55 to 65%.  In light of normal ejection fraction on stress test, repeat echocardiogram was ordered to confirm ejection fraction.  She otherwise feels okay, denies any chest pain or shortness of breath.  Takes her medications as prescribed.  Checks her weight frequently and is usually around 194 to 199 pounds.  Had a right retropharyngeal common carotid artery to carotid artery bypass in 07/2019.  Being followed by vascular surgery.  Historical notes  Patient has a history of mitral regurgitation being followed periodically with echocardiogram.  Last outside echocardiogram obtained on 05/26/2019, showed mildly reduced EF of 45%, impaired relaxation/grade 1 diastolic dysfunction, moderate concentric LVH, trivial MR, TR, aortic sclerosis no stenosis.  She denies any symptoms of chest pain or shortness of breath at rest or with activity.  She takes Coreg, losartan, HCTZ.  She has a known history of carotid stenosis with carotid bruit.CT angio of the neck showed high-grade calcific stenosis innominate artery, flow-limiting stenosis in the right carotid system.  There is sclerosis with calcification in the carotid bifurcation bilaterally without significant stenosis.  She is seeing vascular surgery for carotid artery  disease.  She takes a baby aspirin and Lipitor 10 mg.  Has a history of liver abnormality hence the low-dose statin.     Past Medical History:  Diagnosis Date  . Arthritis   . Asthma   . COPD (chronic obstructive pulmonary disease) ([place])   . Diverticulitis   . Gout   . Hyperlipidemia   . Hypertension   . Hypothyroidism   . 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 Microhematuria   . Mild mitral regurgitation   . Mild tricuspid regurgitation   . Osteopenia   . Pulmonary fibrosis ([place])   . Urinary incontinence     Past Surgical History:  Procedure Laterality Date  . ABDOMINAL HYSTERECTOMY    . ANKLE SURGERY Left   . APPENDECTOMY    . CAROTID PTA/STENT INTERVENTION N/A 07/17/2019   Procedure: CAROTID PTA/STENT INTERVENTION;  Surgeon: Algernon Huxley, MD;  Location: [facility];  Service: Cardiovascular;  Laterality: N/A;  . CAROTID-SUBCLAVIAN BYPASS GRAFT N/A 07/26/2019   Procedure: BYPASS GRAFT CAROTID-SUBCLAVIAN ( CAROTID TO CAROTID BYPASS);  Surgeon: Algernon Huxley, MD;  Location: [facility];  Service: Vascular;  Laterality: N/A;  . CHOLECYSTECTOMY    . COLONOSCOPY WITH PROPOFOL N/A 12/04/2014   Procedure: COLONOSCOPY WITH PROPOFOL;  Surgeon: Lollie Sails, MD;  Location: [facility];  Service: Endoscopy;  Laterality: N/A;  . FRACTURE SURGERY     Femur Fx; LT Ankle Pinning  . JOINT REPLACEMENT     RT TKR  . Patial Thyroid Resection      Current Medications: Current Meds  Medication Sig  . allopurinol (ZYLOPRIM) 100 MG tablet Take 2 tablets (200 mg total) by mouth daily.  Marland Kitchen ascorbic acid (VITAMIN C) 500 MG tablet Take 500 mg by mouth daily.  Marland Kitchen aspirin EC 81 MG tablet Take 81 mg by mouth daily.  Marland Kitchen atorvastatin (LIPITOR) 10 MG tablet Take 1 tablet (10 mg total) by mouth daily at 6 PM.  . carvedilol (COREG) 25 MG tablet TAKE 1/2 TABLET TWICE DAILY WITH MEALS  . cholecalciferol (VITAMIN D) 400 UNITS TABS tablet Take 1,000 Units by mouth.  .  cloNIDine (CATAPRES) 0.1 MG tablet Take 0.5 tablets (0.05 mg total) by mouth 2 (two) times daily.  . clopidogrel (PLAVIX) 75 MG tablet Take 1 tablet (75 mg total) by mouth daily.  . diclofenac sodium (VOLTAREN) 1 % GEL Apply 3 grams to 3 large joints, up to 3 times daily as needed.  . furosemide (LASIX) 40 MG tablet Take one tablet by mouth two times daily  . Ginger, Zingiber officinalis, (GINGER PO) Take by mouth.  Marland Kitchen ibuprofen (ADVIL) 600 MG tablet Take 1 tablet (600 mg total) by mouth every 8 (eight) hours as needed.  Marland Kitchen ketorolac (TORADOL) 30 MG/ML injection ketorolac 30 mg/mL injection syringe  Inject 1 mL by intramuscular route.  Marland Kitchen levocetirizine (XYZAL) 5 MG tablet TAKE 1 TABLET EVERY DAY  FOR  ALLERGIES  . levothyroxine (SYNTHROID) 137 MCG tablet Take 1 tablet (137 mcg total) by mouth daily before breakfast.  . losartan-hydrochlorothiazide (HYZAAR) 100-25 MG tablet Take 1 tablet by mouth daily.  . magnesium oxide (MAG-OX) 400 MG tablet Take 400 mg by mouth daily.   . meclizine (ANTIVERT) 12.5 MG tablet Take 12.5 mg by mouth as needed.   . Misc Natural Products (TART CHERRY ADVANCED PO) Take by mouth daily.  . Multiple Vitamins-Minerals (PRESERVISION AREDS 2 PO) Take by mouth 2 (two) times daily.  Marland Kitchen omega-3 acid ethyl esters (LOVAZA) 1 G capsule Take 1 g by mouth 2 (two) times daily.  Marland Kitchen omeprazole (PRILOSEC) 20 MG capsule Take one capsule by mouth two times daily  . oxyCODONE (OXY IR/ROXICODONE) 5 MG immediate release tablet Take 1 tablet (5 mg total) by mouth 2 (two) times daily as needed for severe pain.  . potassium chloride (KLOR-CON) 10 MEQ tablet Take 1 tablet (10 mEq total) by mouth daily.  . predniSONE (DELTASONE) 5 MG tablet Take 2 tablets (10 mg total) by mouth daily with breakfast. (Patient taking differently: Take 10 mg by mouth as needed. )  . tiZANidine (ZANAFLEX) 4 MG tablet Take 1 tablet (4 mg total) by mouth 2 (two) times daily.  . traMADol (ULTRAM) 50 MG tablet Take 1  tablet (50 mg total) by mouth every 8 (eight) hours as needed.  . TURMERIC PO Take by mouth daily.  . vitamin B-12 (CYANOCOBALAMIN) 100 MCG tablet   . vitamin E 400 UNIT capsule Take 400 Units by mouth 2 (two) times daily.      Allergies:   Codeine, Levaquin [levofloxacin], and Tape   Social History   Socioeconomic History  . Marital status: Married    Spouse name: Jeneen Rinks  . Number of children: Not on file  . Years of education: Not on file  . Highest education level: Not on file  Occupational History  . Not on file  Tobacco Use  . Smoking status: Former Smoker    Years: 1.00    Types: Cigarettes  . Smokeless tobacco: Never Used  Substance and Sexual Activity  . Alcohol use:  No  . Drug use: No  . Sexual activity: Not Currently  Other Topics Concern  . Not on file  Social History Narrative   Lives at home with husband in private residence   Social Determinants of Health   Financial Resource Strain:   . Difficulty of Paying Living Expenses:   Food Insecurity:   . Worried About Charity fundraiser in the Last Year:   . Arboriculturist in the Last Year:   Transportation Needs:   . Film/video editor (Medical):   Marland Kitchen Lack of Transportation (Non-Medical):   Physical Activity:   . Days of Exercise per Week:   . Minutes of Exercise per Session:   Stress:   . Feeling of Stress :   Social Connections:   . Frequency of Communication with Friends and Family:   . Frequency of Social Gatherings with Friends and Family:   . Attends Religious Services:   . Active Member of Clubs or Organizations:   . Attends Archivist Meetings:   Marland Kitchen Marital Status:      Family History: The patient's family history includes Anxiety disorder in her daughter; Arthritis in her daughter; Depression in her daughter; Diabetes in her daughter, father, and son; Heart Problems in her father, mother, and sister; Hypertension in her son; Lupus in her sister; Neuropathy in her daughter; Thyroid  disease in her sister.  ROS:   Please see the history of present illness.     All other systems reviewed and are negative.  EKGs/Labs/Other Studies Reviewed:    The following studies were reviewed today: echocardiogram obtained on 05/26/2019,  showed mildly reduced EF of 45%,  impaired relaxation/grade 1 diastolic dysfunction,  moderate concentric LVH, trivial MR, TR, aortic sclerosis no stenosis  EKG:  EKG is  ordered today.  Sinus rhythm.  Recent Labs: 02/07/2019: ALT 27 04/14/2019: TSH 2.700 10/03/2019: BUN 18; Creatinine, Ser 1.04; Hemoglobin 12.0; Magnesium 1.6; Platelets 141; Potassium 3.9; Sodium 139  Recent Lipid Panel    Component Value Date/Time   CHOL 133 02/07/2019 1121   TRIG 114 02/07/2019 1121   HDL 67 02/07/2019 1121   LDLCALC 43 02/07/2019 1121    Physical Exam:    VS:  BP (!) 146/60 (BP Location: Left Arm, Patient Position: Sitting, Cuff Size: Normal)   Pulse 65   Ht 5\' 3"  (1.6 m)   Wt 198 lb 4 oz (89.9 kg)   SpO2 96%   BMI 35.12 kg/m     Wt Readings from Last 3 Encounters:  10/09/19 198 lb 4 oz (89.9 kg)  08/31/19 200 lb (90.7 kg)  08/18/19 199 lb 12 oz (90.6 kg)     GEN:  Well nourished, well developed in no acute distress HEENT: Normal NECK: No JVD; bilateral carotid bruits noted LYMPHATICS: No lymphadenopathy CARDIAC: RRR, 2/6 systolic murmur noted, RESPIRATORY:  Clear to auscultation without rales, wheezing or rhonchi  ABDOMEN: Soft, non-tender, non-distended MUSCULOSKELETAL: Trace to 1+ edema; No deformity, left edema greater than right which is chronic due to left ankle screw surgery. SKIN: Warm and dry NEUROLOGIC:  Alert and oriented x 3 PSYCHIATRIC:  Normal affect   ASSESSMENT:    1. Grade II diastolic dysfunction   2. Essential hypertension   3. Bilateral carotid artery stenosis    PLAN:    In order of problems listed above:  1. Mildly reduced ejection fraction with EF 45% on outside echocardiogram.  Lexiscan stress test was  low risk with no evidence  for ischemia.  Ejection fraction on nuclear stress test was normal.  Repeat echocardiogram on 10/05/2019 showed normal systolic function, EF 60 to 123456, grade 2 diastolic dysfunction, severely dilated left atrium, aortic sclerosis, mild MR.  Patient is without symptoms of dyspnea or edema.  Continue Coreg 12.5 twice daily, losartan 100 daily.  Continue Lasix as needed.  2. History of hypertension, blood pressure reasonably controlled.  Continue current BP meds. 3. History of carotid artery disease, on aspirin, Plavix and Lipitor.  Follows up with vascular surgery.  Follow-up 6 months  This note was generated in part or whole with voice recognition software. Voice recognition is usually quite accurate but there are transcription errors that can and very often do occur. I apologize for any typographical errors that were not detected and corrected.  Medication Adjustments/Labs and Tests Ordered: Current medicines are reviewed at length with the patient today.  Concerns regarding medicines are outlined above.  Orders Placed This Encounter  Procedures  . EKG 12-Lead   No orders of the defined types were placed in this encounter.   Patient Instructions  Medication Instructions:  Your physician recommends that you continue on your current medications as directed. Please refer to the Current Medication list given to you today.  *If you need a refill on your cardiac medications before your next appointment, please call your pharmacy*   Lab Work: None ordered If you have labs (blood work) drawn today and your tests are completely normal, you will receive your results only by: Marland Kitchen MyChart Message (if you have MyChart) OR . A paper copy in the mail If you have any lab test that is abnormal or we need to change your treatment, we will call you to review the results.   Testing/Procedures: None ordered   Follow-Up: At [facility], you and your health needs are our  priority.  As part of our continuing mission to provide you with exceptional heart care, we have created designated Provider Care Teams.  These Care Teams include your primary Cardiologist (physician) and Advanced Practice Providers (APPs -  Physician Assistants and Nurse Practitioners) who all work together to provide you with the care you need, when you need it.  We recommend signing up for the patient portal called "MyChart".  Sign up information is provided on this After Visit Summary.  MyChart is used to connect with patients for Virtual Visits (Telemedicine).  Patients are able to view lab/test results, encounter notes, upcoming appointments, etc.  Non-urgent messages can be sent to your provider as well.   To learn more about what you can do with MyChart, go to [email or web address].    Your next appointment:   6 month(s)  The format for your next appointment:   In Person  Provider:    You may see Dr. Garen Lah or one of the following Advanced Practice Providers on your designated Care Team:    Murray Hodgkins, NP  Christell Faith, PA-C  Marrianne Mood, PA-C    Other Instructions N/A     Signed, Kate Sable, MD  10/09/2019 3:09 PM    [place]

## 2019-10-09 NOTE — Patient Instructions (Signed)
Medication Instructions:  Your physician recommends that you continue on your current medications as directed. Please refer to the Current Medication list given to you today.  *If you need a refill on your cardiac medications before your next appointment, please call your pharmacy*   Lab Work: None ordered If you have labs (blood work) drawn today and your tests are completely normal, you will receive your results only by: Marland Kitchen MyChart Message (if you have MyChart) OR . A paper copy in the mail If you have any lab test that is abnormal or we need to change your treatment, we will call you to review the results.   Testing/Procedures: None ordered   Follow-Up: At [facility], you and your health needs are our priority.  As part of our continuing mission to provide you with exceptional heart care, we have created designated Provider Care Teams.  These Care Teams include your primary Cardiologist (physician) and Advanced Practice Providers (APPs -  Physician Assistants and Nurse Practitioners) who all work together to provide you with the care you need, when you need it.  We recommend signing up for the patient portal called "MyChart".  Sign up information is provided on this After Visit Summary.  MyChart is used to connect with patients for Virtual Visits (Telemedicine).  Patients are able to view lab/test results, encounter notes, upcoming appointments, etc.  Non-urgent messages can be sent to your provider as well.   To learn more about what you can do with MyChart, go to [email or web address].    Your next appointment:   6 month(s)  The format for your next appointment:   In Person  Provider:    You may see Dr. Garen Lah or one of the following Advanced Practice Providers on your designated Care Team:    Murray Hodgkins, NP  Christell Faith, PA-C  Marrianne Mood, PA-C    Other Instructions N/A

## 2019-10-11 ENCOUNTER — Other Ambulatory Visit: Payer: Self-pay

## 2019-10-11 DIAGNOSIS — J0100 Acute maxillary sinusitis, unspecified: Secondary | ICD-10-CM

## 2019-10-11 MED ORDER — PREDNISONE 5 MG PO TABS: 10.0000 mg | ORAL_TABLET | Freq: Every day | ORAL | 1 refills | Status: DC

## 2019-10-12 ENCOUNTER — Ambulatory Visit: Payer: Medicare HMO | Admitting: Nurse Practitioner

## 2019-10-12 ENCOUNTER — Telehealth: Payer: Self-pay

## 2019-10-12 NOTE — Telephone Encounter (Signed)
Lmom to confirm and screen for 10-16-19 ov.

## 2019-10-14 NOTE — Progress Notes (Addendum)
Martha Peterson  [phone]  [phone]     PASSION LAVIN DOB: [birth year]  MR#: [id number]  IBB#:[id number]  Patient Care Team: Martha Guise, MD as PCP - General (Internal Medicine) Lloyd Huger, MD as Consulting Physician (Oncology)  I connected with Martha Peterson on 10/20/19 at  2:45 PM EDT by video enabled telemedicine visit and verified that I am speaking with the correct person using two identifiers.   I discussed the limitations, risks, security and privacy concerns of performing an evaluation and management service by telemedicine and the availability of in-person appointments. I also discussed with the patient that there may be a patient responsible charge related to this service. The patient expressed understanding and agreed to proceed.   Other persons participating in the visit and their role in the encounter: Patient, MD, daughter Martha Peterson).  Patient's location: Home. Provider's location: Clinic.  CHIEF COMPLAINT: MGUS  INTERVAL HISTORY: Patient agreed to video assisted telemedicine visit for discussion of her laboratory work and routine yearly evaluation.  She had carotid bypass surgery several months ago and is now fully recovered.  She currently feels well and is asymptomatic.  She has no neurologic complaints. She denies any recent fevers or illnesses. She has a good appetite and denies weight loss. She denies any chest pain, shortness of breath, cough, or hemoptysis.. She denies any nausea, vomiting, constipation, or diarrhea. She has no urinary complaints.  She denies any bony pain.  Patient offers no specific complaints today.  REVIEW OF SYSTEMS:   Review of Systems  Constitutional: Negative.  Negative for fever, malaise/fatigue and weight loss.  Respiratory: Negative.  Negative for cough and shortness of breath.   Cardiovascular: Negative.  Negative for chest pain and leg swelling.  Gastrointestinal: Negative.   Negative for abdominal pain and constipation.  Genitourinary: Negative.  Negative for dysuria.  Musculoskeletal: Negative.  Negative for back pain.  Neurological: Negative.  Negative for sensory change, focal weakness and weakness.  Endo/Heme/Allergies: Does not bruise/bleed easily.  Psychiatric/Behavioral: Negative.  The patient is not nervous/anxious.    As per HPI. Otherwise, a complete review of systems is negative.   PAST MEDICAL HISTORY: Past Medical History:  Diagnosis Date  . Arthritis   . Asthma   . COPD (chronic obstructive pulmonary disease) ([place])   . Diverticulitis   . Gout   . Hyperlipidemia   . Hypertension   . Hypothyroidism   . 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 Microhematuria   . Mild mitral regurgitation   . Mild tricuspid regurgitation   . Osteopenia   . Pulmonary fibrosis ([place])   . Urinary incontinence     PAST SURGICAL HISTORY: Past Surgical History:  Procedure Laterality Date  . ABDOMINAL HYSTERECTOMY    . ANKLE SURGERY Left   . APPENDECTOMY    . CAROTID PTA/STENT INTERVENTION N/A 07/17/2019   Procedure: CAROTID PTA/STENT INTERVENTION;  Surgeon: Algernon Huxley, MD;  Location: [facility];  Service: Cardiovascular;  Laterality: N/A;  . CAROTID-SUBCLAVIAN BYPASS GRAFT N/A 07/26/2019   Procedure: BYPASS GRAFT CAROTID-SUBCLAVIAN ( CAROTID TO CAROTID BYPASS);  Surgeon: Algernon Huxley, MD;  Location: [facility];  Service: Vascular;  Laterality: N/A;  . CHOLECYSTECTOMY    . COLONOSCOPY WITH PROPOFOL N/A 12/04/2014   Procedure: COLONOSCOPY WITH PROPOFOL;  Surgeon: Lollie Sails, MD;  Location: [facility] ENDOSCOPY;  Service: Endoscopy;  Laterality: N/A;  . FRACTURE SURGERY     Femur Fx; LT Ankle  Pinning  . JOINT REPLACEMENT     RT TKR  . Patial Thyroid Resection      FAMILY HISTORY: Reviewed and unchanged. No reported history of malignancy or chronic disease.   GYNECOLOGIC HISTORY:  No LMP recorded. Patient has had a  hysterectomy.     ADVANCED DIRECTIVES:    HEALTH MAINTENANCE: Social History   Tobacco Use  . Smoking status: Former Smoker    Years: 1.00    Types: Cigarettes  . Smokeless tobacco: Never Used  Substance Use Topics  . Alcohol use: No  . Drug use: No       Allergies  Allergen Reactions  . Codeine   . Levaquin [Levofloxacin]   . Tape     latex    Current Outpatient Medications  Medication Sig Dispense Refill  . allopurinol (ZYLOPRIM) 100 MG tablet Take 2 tablets (200 mg total) by mouth daily. 180 tablet 1  . ascorbic acid (VITAMIN C) 500 MG tablet Take 500 mg by mouth daily.    Marland Kitchen aspirin EC 81 MG tablet Take 81 mg by mouth daily.    Marland Kitchen atorvastatin (LIPITOR) 10 MG tablet Take 1 tablet (10 mg total) by mouth daily at 6 PM. 90 tablet 1  . carvedilol (COREG) 25 MG tablet TAKE 1/2 TABLET TWICE DAILY WITH MEALS 90 tablet 1  . cholecalciferol (VITAMIN D) 400 UNITS TABS tablet Take 1,000 Units by mouth.    . cloNIDine (CATAPRES) 0.1 MG tablet Take 0.5 tablets (0.05 mg total) by mouth 2 (two) times daily. 180 tablet 1  . clopidogrel (PLAVIX) 75 MG tablet Take 1 tablet (75 mg total) by mouth daily. 30 tablet 11  . diclofenac sodium (VOLTAREN) 1 % GEL Apply 3 grams to 3 large joints, up to 3 times daily as needed. 3 Tube 3  . furosemide (LASIX) 40 MG tablet Take one tablet by mouth two times daily 180 tablet 1  . Ginger, Zingiber officinalis, (GINGER PO) Take by mouth.    Marland Kitchen ibuprofen (ADVIL) 600 MG tablet Take 1 tablet (600 mg total) by mouth every 8 (eight) hours as needed. 90 tablet 1  . ketorolac (TORADOL) 30 MG/ML injection ketorolac 30 mg/mL injection syringe  Inject 1 mL by intramuscular route.    Marland Kitchen levocetirizine (XYZAL) 5 MG tablet TAKE 1 TABLET EVERY DAY  FOR  ALLERGIES 90 tablet 1  . levothyroxine (SYNTHROID) 137 MCG tablet Take 1 tablet (137 mcg total) by mouth daily before breakfast. 90 tablet 1  . losartan-hydrochlorothiazide (HYZAAR) 100-25 MG tablet Take 1 tablet by  mouth daily. 90 tablet 1  . magnesium oxide (MAG-OX) 400 MG tablet Take 400 mg by mouth daily.     . meclizine (ANTIVERT) 12.5 MG tablet Take 12.5 mg by mouth as needed.     . Misc Natural Products (TART CHERRY ADVANCED PO) Take by mouth daily.    . Multiple Vitamins-Minerals (PRESERVISION AREDS 2 PO) Take by mouth 2 (two) times daily.    Marland Kitchen omega-3 acid ethyl esters (LOVAZA) 1 G capsule Take 1 g by mouth 2 (two) times daily.    Marland Kitchen omeprazole (PRILOSEC) 20 MG capsule Take one capsule by mouth two times daily 180 capsule 1  . oxyCODONE (OXY IR/ROXICODONE) 5 MG immediate release tablet Take 1 tablet (5 mg total) by mouth 2 (two) times daily as needed for severe pain. 45 tablet 0  . potassium chloride (KLOR-CON) 10 MEQ tablet Take 1 tablet (10 mEq total) by mouth daily. 90 tablet 1  .  predniSONE (DELTASONE) 5 MG tablet Take 2 tablets (10 mg total) by mouth daily with breakfast. 180 tablet 1  . tiZANidine (ZANAFLEX) 4 MG tablet Take 1 tablet (4 mg total) by mouth 2 (two) times daily. 90 tablet 1  . traMADol (ULTRAM) 50 MG tablet Take 1 tablet (50 mg total) by mouth every 8 (eight) hours as needed. 90 tablet 2  . TURMERIC PO Take by mouth daily.    . vitamin B-12 (CYANOCOBALAMIN) 100 MCG tablet     . vitamin E 400 UNIT capsule Take 400 Units by mouth 2 (two) times daily.      No current facility-administered medications for this visit.    OBJECTIVE: There were no vitals taken for this visit.   There is no height or weight on file to calculate BMI.    ECOG FS:0 - Asymptomatic  General: Well-developed, well-nourished, no acute distress. HEENT: Normocephalic. Neuro: Alert, answering all questions appropriately. Cranial nerves grossly intact. Psych: Normal affect.  LAB RESULTS:  No visits with results within 3 Day(s) from this visit.  Latest known visit with results is:  Appointment on 10/16/2019  Component Date Value Ref Range Status  . Total Protein ELP 10/16/2019 7.4  6.0 - 8.5 g/dL Final   . Albumin ELP 10/16/2019 3.9  2.9 - 4.4 g/dL Final  . Alpha-1-Globulin 10/16/2019 0.2  0.0 - 0.4 g/dL Final  . Alpha-2-Globulin 10/16/2019 0.7  0.4 - 1.0 g/dL Final  . Beta Globulin 10/16/2019 0.9  0.7 - 1.3 g/dL Final  . Gamma Globulin 10/16/2019 1.6  0.4 - 1.8 g/dL Final  . M-Spike, % 10/16/2019 0.4* Not Observed g/dL Final  . SPE Interp. 10/16/2019 Comment   Final   Comment: (NOTE) The SPE pattern demonstrates a single peak (M-spike) in the gamma region which may represent monoclonal protein. This peak may also be caused by circulating immune complexes, cryoglobulins, C-reactive protein, fibrinogen or hemolysis.  If clinically indicated, the presence of a monoclonal gammopathy may be confirmed by immuno- fixation, as well as an evaluation of the urine for the presence of Bence-Jones protein. Performed At: [facility] [place], [place] [id number] Rush Farmer MD VQ:[id number]   . Comment 10/16/2019 Comment   Final   Comment: (NOTE) Protein electrophoresis scan will follow via computer, mail, or courier delivery.   . Globulin, Total 10/16/2019 3.5  2.2 - 3.9 g/dL Corrected  . A/G Ratio 10/16/2019 1.1  0.7 - 1.7 Corrected  . WBC 10/16/2019 4.4  4.0 - 10.5 K/uL Final  . RBC 10/16/2019 3.67* 3.87 - 5.11 MIL/uL Final  . Hemoglobin 10/16/2019 11.6* 12.0 - 15.0 g/dL Final  . HCT 10/16/2019 35.1* 36.0 - 46.0 % Final  . MCV 10/16/2019 95.6  80.0 - 100.0 fL Final  . MCH 10/16/2019 31.6  26.0 - 34.0 pg Final  . MCHC 10/16/2019 33.0  30.0 - 36.0 g/dL Final  . RDW 10/16/2019 13.2  11.5 - 15.5 % Final  . Platelets 10/16/2019 136* 150 - 400 K/uL Final  . nRBC 10/16/2019 0.0  0.0 - 0.2 % Final  . Neutrophils Relative % 10/16/2019 60  % Final  . Neutro Abs 10/16/2019 2.7  1.7 - 7.7 K/uL Final  . Lymphocytes Relative 10/16/2019 26  % Final  . Lymphs Abs 10/16/2019 1.1  0.7 - 4.0 K/uL Final  . Monocytes Relative 10/16/2019 11  % Final  . Monocytes Absolute  10/16/2019 0.5  0.1 - 1.0 K/uL Final  . Eosinophils Relative 10/16/2019 2  %  Final  . Eosinophils Absolute 10/16/2019 0.1  0.0 - 0.5 K/uL Final  . Basophils Relative 10/16/2019 1  % Final  . Basophils Absolute 10/16/2019 0.0  0.0 - 0.1 K/uL Final  . Immature Granulocytes 10/16/2019 0  % Final  . Abs Immature Granulocytes 10/16/2019 0.01  0.00 - 0.07 K/uL Final   Performed at [facility], [address], [place], [place] [address]  . Sodium 10/16/2019 137  135 - 145 mmol/L Final  . Potassium 10/16/2019 3.6  3.5 - 5.1 mmol/L Final  . Chloride 10/16/2019 96* 98 - 111 mmol/L Final  . CO2 10/16/2019 29  22 - 32 mmol/L Final  . Glucose, Bld 10/16/2019 106* 70 - 99 mg/dL Final   Glucose reference range applies only to samples taken after fasting for at least 8 hours.  . BUN 10/16/2019 37* 8 - 23 mg/dL Final  . Creatinine, Ser 10/16/2019 1.42* 0.44 - 1.00 mg/dL Final  . Calcium 10/16/2019 9.3  8.9 - 10.3 mg/dL Final  . GFR calc non Af Amer 10/16/2019 35* >60 mL/min Final  . GFR calc Af Amer 10/16/2019 40* >60 mL/min Final  . Anion gap 10/16/2019 12  5 - 15 Final   Performed at [facility], [address] [place], [place] [address]  . IgG (Immunoglobin G), Serum 10/16/2019 1,370  586 - 1,602 mg/dL Final  . IgA 10/16/2019 488* 64 - 422 mg/dL Final  . IgM (Immunoglobulin M), Srm 10/16/2019 64  26 - 217 mg/dL Final   Comment: (NOTE) Performed At: [facility] [place], [place] [id number] Rush Farmer MD [id number]   . Kappa free light chain 10/16/2019 55.9* 3.3 - 19.4 mg/L Final  . Lamda free light chains 10/16/2019 51.3* 5.7 - 26.3 mg/L Final  . Kappa, lamda light chain ratio 10/16/2019 1.09  0.26 - 1.65 Final   Comment: (NOTE) Performed At: [facility] [place], [place] [id number] Rush Farmer MD OF:[id number]     STUDIES: No results found.  ASSESSMENT: MGUS  PLAN:   1. MGUS: Patient's most recent M spike remains  stable at 0.4.  This is unchanged since at least March 2017.  IgA level also remains mildly elevated, but chronic and unchanged at 488.  Kappa and lambda light chains continue to be within normal limits.  Her creatinine level has trended up slightly to 1.42, but she has no other evidence of endorgan damage.  No intervention is needed at this time.  Patient does not require bone marrow biopsy or metastatic bone survey.  Return to clinic in 1 year with repeat laboratory work and video assisted telemedicine visit.   2. Leukopenia: Resolved.  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 By report, previous bone marrow biopsy was negative for underlying pathology, but we do not have these results. 3. 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 4.  Renal insufficiency: Patient's creatinine is trended up slightly to 1.42.  Continue monitoring and follow-up per primary care.  I provided 20 minutes of face-to-face video visit time during this encounter which included chart review, counseling, and coordination of care as documented above.   Patient expressed understanding and was in agreement with this plan. She also understands that She can call clinic at any time with any questions, concerns, or complaints.    Lloyd Huger, MD   10/20/2019 9:51 AM

## 2019-10-16 ENCOUNTER — Inpatient Hospital Stay: Payer: Medicare HMO | Attending: Oncology

## 2019-10-16 ENCOUNTER — Encounter: Payer: Self-pay | Admitting: Nurse Practitioner

## 2019-10-16 ENCOUNTER — Ambulatory Visit (INDEPENDENT_AMBULATORY_CARE_PROVIDER_SITE_OTHER): Payer: Medicare HMO | Admitting: Nurse Practitioner

## 2019-10-16 ENCOUNTER — Other Ambulatory Visit: Payer: Self-pay

## 2019-10-16 VITALS — BP 116/47 | HR 70 | Temp 97.1°F | Resp 16 | Ht 63.0 in | Wt 200.0 lb

## 2019-10-16 DIAGNOSIS — M064 Inflammatory polyarthropathy: Secondary | ICD-10-CM

## 2019-10-16 DIAGNOSIS — D509 Iron deficiency anemia, unspecified: Secondary | ICD-10-CM

## 2019-10-16 DIAGNOSIS — D472 Monoclonal gammopathy: Secondary | ICD-10-CM | POA: Diagnosis not present

## 2019-10-16 DIAGNOSIS — I10 Essential (primary) hypertension: Secondary | ICD-10-CM | POA: Diagnosis not present

## 2019-10-16 LAB — CBC WITH DIFFERENTIAL/PLATELET
Abs Immature Granulocytes: 0.01 10*3/uL (ref 0.00–0.07)
Basophils Absolute: 0 10*3/uL (ref 0.0–0.1)
Basophils Relative: 1 %
Eosinophils Absolute: 0.1 10*3/uL (ref 0.0–0.5)
Eosinophils Relative: 2 %
HCT: 35.1 % — ABNORMAL LOW (ref 36.0–46.0)
Hemoglobin: 11.6 g/dL — ABNORMAL LOW (ref 12.0–15.0)
Immature Granulocytes: 0 %
Lymphocytes Relative: 26 %
Lymphs Abs: 1.1 10*3/uL (ref 0.7–4.0)
MCH: 31.6 pg (ref 26.0–34.0)
MCHC: 33 g/dL (ref 30.0–36.0)
MCV: 95.6 fL (ref 80.0–100.0)
Monocytes Absolute: 0.5 10*3/uL (ref 0.1–1.0)
Monocytes Relative: 11 %
Neutro Abs: 2.7 10*3/uL (ref 1.7–7.7)
Neutrophils Relative %: 60 %
Platelets: 136 10*3/uL — ABNORMAL LOW (ref 150–400)
RBC: 3.67 MIL/uL — ABNORMAL LOW (ref 3.87–5.11)
RDW: 13.2 % (ref 11.5–15.5)
WBC: 4.4 10*3/uL (ref 4.0–10.5)
nRBC: 0 % (ref 0.0–0.2)

## 2019-10-16 LAB — BASIC METABOLIC PANEL
Anion gap: 12 (ref 5–15)
BUN: 37 mg/dL — ABNORMAL HIGH (ref 8–23)
CO2: 29 mmol/L (ref 22–32)
Calcium: 9.3 mg/dL (ref 8.9–10.3)
Chloride: 96 mmol/L — ABNORMAL LOW (ref 98–111)
Creatinine, Ser: 1.42 mg/dL — ABNORMAL HIGH (ref 0.44–1.00)
GFR calc Af Amer: 40 mL/min — ABNORMAL LOW (ref >60)
GFR calc non Af Amer: 35 mL/min — ABNORMAL LOW (ref >60)
Glucose, Bld: 106 mg/dL — ABNORMAL HIGH (ref 70–99)
Potassium: 3.6 mmol/L (ref 3.5–5.1)
Sodium: 137 mmol/L (ref 135–145)

## 2019-10-16 MED ORDER — OXYCODONE HCL 5 MG PO TABS: 5.0000 mg | ORAL_TABLET | Freq: Two times a day (BID) | ORAL | 0 refills | Status: DC | PRN

## 2019-10-16 MED ORDER — TRAMADOL HCL 50 MG PO TABS: 50.0000 mg | ORAL_TABLET | Freq: Three times a day (TID) | ORAL | 2 refills | Status: DC | PRN

## 2019-10-16 NOTE — Progress Notes (Signed)
[facility] [place], [place] [address]  Internal MEDICINE  Office Visit Note  Patient Name: Martha Peterson  [id number]  [id number]  Date of Service: 10/28/2019  Chief Complaint  Patient presents with  . Follow-up    lowered dose to colonidine take 1.5 tab twice daily    The patient is here for follow up visit. We lowered her clonidine dose to 1/2 tablet twice daily. Blood pressure is still running a little on the low side after medication kicks in, but she feels good. Does not have dizziness or headache. She has brought blood pressure log, checking sugar prior to taking medication. Generally running [id number] to 0000000 systolic and [id number] diastolic. After taking medication, usually runs 110s to 120s  And 123456 to 0000000 diastolic.  She continues to have severe lower back pain. She does take tramadol twice daily if needed. Does take oxycodone 5mg  when needed for severe pain. Last prescription was given for oxycodone in 08/2019.       Current Medication: Outpatient Encounter Medications as of 10/16/2019  Medication Sig Note  . allopurinol (ZYLOPRIM) 100 MG tablet Take 2 tablets (200 mg total) by mouth daily.   Marland Kitchen ascorbic acid (VITAMIN C) 500 MG tablet Take 500 mg by mouth daily.   Marland Kitchen aspirin EC 81 MG tablet Take 81 mg by mouth daily.   Marland Kitchen atorvastatin (LIPITOR) 10 MG tablet Take 1 tablet (10 mg total) by mouth daily at 6 PM.   . carvedilol (COREG) 25 MG tablet TAKE 1/2 TABLET TWICE DAILY WITH MEALS   . cholecalciferol (VITAMIN D) 400 UNITS TABS tablet Take 1,000 Units by mouth.   . cloNIDine (CATAPRES) 0.1 MG tablet Take 0.5 tablets (0.05 mg total) by mouth 2 (two) times daily.   . clopidogrel (PLAVIX) 75 MG tablet Take 1 tablet (75 mg total) by mouth daily.   . diclofenac sodium (VOLTAREN) 1 % GEL Apply 3 grams to 3 large joints, up to 3 times daily as needed.   . furosemide (LASIX) 40 MG tablet Take one tablet by mouth two times daily   . Ginger, Zingiber officinalis,  (GINGER PO) Take by mouth.   Marland Kitchen ibuprofen (ADVIL) 600 MG tablet Take 1 tablet (600 mg total) by mouth every 8 (eight) hours as needed.   Marland Kitchen ketorolac (TORADOL) 30 MG/ML injection ketorolac 30 mg/mL injection syringe  Inject 1 mL by intramuscular route.   Marland Kitchen levocetirizine (XYZAL) 5 MG tablet TAKE 1 TABLET EVERY DAY  FOR  ALLERGIES   . levothyroxine (SYNTHROID) 137 MCG tablet Take 1 tablet (137 mcg total) by mouth daily before breakfast.   . losartan-hydrochlorothiazide (HYZAAR) 100-25 MG tablet Take 1 tablet by mouth daily.   . magnesium oxide (MAG-OX) 400 MG tablet Take 400 mg by mouth daily.  08/20/2015: Received from: External Pharmacy  . meclizine (ANTIVERT) 12.5 MG tablet Take 12.5 mg by mouth as needed.    . Misc Natural Products (TART CHERRY ADVANCED PO) Take by mouth daily.   . Multiple Vitamins-Minerals (PRESERVISION AREDS 2 PO) Take by mouth 2 (two) times daily.   Marland Kitchen omega-3 acid ethyl esters (LOVAZA) 1 G capsule Take 1 g by mouth 2 (two) times daily.   Marland Kitchen omeprazole (PRILOSEC) 20 MG capsule Take one capsule by mouth two times daily   . oxyCODONE (OXY IR/ROXICODONE) 5 MG immediate release tablet Take 1 tablet (5 mg total) by mouth 2 (two) times daily as needed for severe pain.   . potassium chloride (KLOR-CON) 10  MEQ tablet Take 1 tablet (10 mEq total) by mouth daily.   . predniSONE (DELTASONE) 5 MG tablet Take 2 tablets (10 mg total) by mouth daily with breakfast.   . tiZANidine (ZANAFLEX) 4 MG tablet Take 1 tablet (4 mg total) by mouth 2 (two) times daily.   . traMADol (ULTRAM) 50 MG tablet Take 1 tablet (50 mg total) by mouth every 8 (eight) hours as needed.   . TURMERIC PO Take by mouth daily.   . vitamin B-12 (CYANOCOBALAMIN) 100 MCG tablet    . vitamin E 400 UNIT capsule Take 400 Units by mouth 2 (two) times daily.  08/20/2015: Received from: External Pharmacy  . [DISCONTINUED] oxyCODONE (OXY IR/ROXICODONE) 5 MG immediate release tablet Take 1 tablet (5 mg total) by mouth 2 (two) times  daily as needed for severe pain.   . [DISCONTINUED] traMADol (ULTRAM) 50 MG tablet Take 1 tablet (50 mg total) by mouth every 8 (eight) hours as needed.    No facility-administered encounter medications on file as of 10/16/2019.    Surgical History: Past Surgical History:  Procedure Laterality Date  . ABDOMINAL HYSTERECTOMY    . ANKLE SURGERY Left   . APPENDECTOMY    . CAROTID PTA/STENT INTERVENTION N/A 07/17/2019   Procedure: CAROTID PTA/STENT INTERVENTION;  Surgeon: Algernon Huxley, MD;  Location: [facility];  Service: Cardiovascular;  Laterality: N/A;  . CAROTID-SUBCLAVIAN BYPASS GRAFT N/A 07/26/2019   Procedure: BYPASS GRAFT CAROTID-SUBCLAVIAN ( CAROTID TO CAROTID BYPASS);  Surgeon: Algernon Huxley, MD;  Location: [facility];  Service: Vascular;  Laterality: N/A;  . CHOLECYSTECTOMY    . COLONOSCOPY WITH PROPOFOL N/A 12/04/2014   Procedure: COLONOSCOPY WITH PROPOFOL;  Surgeon: Lollie Sails, MD;  Location: [facility];  Service: Endoscopy;  Laterality: N/A;  . FRACTURE SURGERY     Femur Fx; LT Ankle Pinning  . JOINT REPLACEMENT     RT TKR  . Patial Thyroid Resection      Medical History: Past Medical History:  Diagnosis Date  . Arthritis   . Asthma   . COPD (chronic obstructive pulmonary disease) ([place])   . Diverticulitis   . Gout   . Hyperlipidemia   . Hypertension   . Hypothyroidism   . 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 Microhematuria   . Mild mitral regurgitation   . Mild tricuspid regurgitation   . Osteopenia   . Pulmonary fibrosis ([place])   . Urinary incontinence     Family History: Family History  Problem Relation Age of Onset  . Heart Problems Mother   . Heart Problems Father   . Diabetes Father   . Lupus Sister   . Thyroid disease Sister   . Heart Problems Sister   . Hypertension Son   . Diabetes Son        borderline   . Arthritis Daughter        in the left knee   . Diabetes Daughter   . Neuropathy Daughter   .  Depression Daughter   . Anxiety disorder Daughter     Social History   Socioeconomic History  . Marital status: Married    Spouse name: Jeneen Rinks  . Number of children: Not on file  . Years of education: Not on file  . Highest education level: Not on file  Occupational History  . Not on file  Tobacco Use  . Smoking status: Former Smoker    Years: 1.00    Types: Cigarettes  .  Smokeless tobacco: Never Used  Substance and Sexual Activity  . Alcohol use: No  . Drug use: No  . Sexual activity: Not Currently  Other Topics Concern  . Not on file  Social History Narrative   Lives at home with husband in private residence   Social Determinants of Health   Financial Resource Strain:   . Difficulty of Paying Living Expenses:   Food Insecurity:   . Worried About Charity fundraiser in the Last Year:   . Arboriculturist in the Last Year:   Transportation Needs:   . Film/video editor (Medical):   Marland Kitchen Lack of Transportation (Non-Medical):   Physical Activity:   . Days of Exercise per Week:   . Minutes of Exercise per Session:   Stress:   . Feeling of Stress :   Social Connections:   . Frequency of Communication with Friends and Family:   . Frequency of Social Gatherings with Friends and Family:   . Attends Religious Services:   . Active Member of Clubs or Organizations:   . Attends Archivist Meetings:   Marland Kitchen Marital Status:   Intimate Partner Violence:   . Fear of Current or Ex-Partner:   . Emotionally Abused:   Marland Kitchen Physically Abused:   . Sexually Abused:       Review of Systems  Constitutional: Positive for activity change and fatigue. Negative for chills and unexpected weight change.       Improving fatigue.   HENT: Positive for trouble swallowing and voice change. Negative for congestion, postnasal drip, rhinorrhea, sneezing and sore throat.   Respiratory: Positive for shortness of breath. Negative for cough, chest tightness and wheezing.        With exertion.    Cardiovascular: Negative for chest pain and palpitations.       Blood pressure is stable.   Gastrointestinal: Negative for abdominal pain, constipation, diarrhea, nausea and vomiting.  Endocrine: Negative for cold intolerance, heat intolerance, polydipsia and polyuria.  Musculoskeletal: Positive for myalgias and neck pain. Negative for arthralgias, back pain and joint swelling.       Improved neck tenderness after surgery. Continues to have severe lower back pain.   Skin: Negative for rash.  Allergic/Immunologic: Negative for environmental allergies.  Neurological: Positive for headaches. Negative for dizziness, tremors and numbness.       Gets mild headache for a few moments sometimes after standing up from seated or laying position.   Hematological: Negative for adenopathy. Does not bruise/bleed easily.  Psychiatric/Behavioral: Negative for behavioral problems (Depression), sleep disturbance and suicidal ideas. The patient is not nervous/anxious.     Today's Vitals   10/16/19 1001  BP: (!) 116/47  Pulse: 70  Resp: 16  Temp: (!) 97.1 F (36.2 C)  SpO2: 96%  Weight: 200 lb (90.7 kg)  Height: 5\' 3"  (1.6 m)   Body mass index is 35.43 kg/m.  Physical Exam Vitals and nursing note reviewed.  Constitutional:      General: She is not in acute distress.    Appearance: Normal appearance. She is well-developed. She is not diaphoretic.  HENT:     Head: Normocephalic and atraumatic.     Nose: Nose normal.     Mouth/Throat:     Pharynx: No oropharyngeal exudate.  Eyes:     Pupils: Pupils are equal, round, and reactive to light.  Neck:     Thyroid: No thyromegaly.     Vascular: No JVD.     Trachea:  No tracheal deviation.     Comments: Well healing surgical incision from carotid to carotid bypass Cardiovascular:     Rate and Rhythm: Normal rate and regular rhythm.     Pulses: Normal pulses.     Heart sounds: Normal heart sounds. No murmur. No friction rub. No gallop.   Pulmonary:      Effort: Pulmonary effort is normal. No respiratory distress.     Breath sounds: Normal breath sounds. No wheezing or rales.  Chest:     Chest wall: No tenderness.  Abdominal:     Palpations: Abdomen is soft.  Musculoskeletal:        General: Normal range of motion.     Cervical back: Normal range of motion and neck supple.     Comments: Having severe low back pain, worse with bending and twisting at the waist. Worse with reaching, pushing, pulling, and lifting with the arms.     Lymphadenopathy:     Cervical: No cervical adenopathy.  Skin:    General: Skin is warm and dry.  Neurological:     General: No focal deficit present.     Mental Status: She is alert and oriented to person, place, and time.     Cranial Nerves: No cranial nerve deficit.  Psychiatric:        Behavior: Behavior normal.        Thought Content: Thought content normal.        Judgment: Judgment normal.   Assessment/Plan: 1. Essential hypertension Blood pressure stable. Continue medication as prescribed.   2. Iron deficiency anemia, unspecified iron deficiency anemia type Improved. Continue to monitor.   3. Inflammatory polyarthritis (HCC) Tramadol may be taken up to three times daily as needed for mild to moderate pain. May take oxycodone 5mg  twice daily as needed for severe pain. New prescriptions for both were sent to pharmacy today.  - oxyCODONE (OXY IR/ROXICODONE) 5 MG immediate release tablet; Take 1 tablet (5 mg total) by mouth 2 (two) times daily as needed for severe pain.  Dispense: 45 tablet; Refill: 0 - traMADol (ULTRAM) 50 MG tablet; Take 1 tablet (50 mg total) by mouth every 8 (eight) hours as needed.  Dispense: 90 tablet; Refill: 2  General Counseling: Ziyanna verbalizes understanding of the findings of todays visit and agrees with plan of treatment. I have discussed any further diagnostic evaluation that may be needed or ordered today. We also reviewed her medications today. she has been  encouraged to call the office with any questions or concerns that should arise related to todays visit.   This patient was seen by [place] with Dr [name] as a part of collaborative care agreement  Meds ordered this encounter  Medications  . oxyCODONE (OXY IR/ROXICODONE) 5 MG immediate release tablet    Sig: Take 1 tablet (5 mg total) by mouth 2 (two) times daily as needed for severe pain.    Dispense:  45 tablet    Refill:  0    Order Specific Question:   Supervising Provider    Answer:   [name] [id number]  . traMADol (ULTRAM) 50 MG tablet    Sig: Take 1 tablet (50 mg total) by mouth every 8 (eight) hours as needed.    Dispense:  90 tablet    Refill:  2    Order Specific Question:   Supervising Provider    Answer:   [name] [id number]    Total time spent: 25 Minutes  Time spent includes review of chart, medications, test results, and follow up plan with the patient.      Dr [name] Internal medicine

## 2019-10-17 LAB — KAPPA/LAMBDA LIGHT CHAINS
Kappa free light chain: 55.9 mg/L — ABNORMAL HIGH (ref 3.3–19.4)
Kappa, lambda light chain ratio: 1.09 (ref 0.26–1.65)
Lambda free light chains: 51.3 mg/L — ABNORMAL HIGH (ref 5.7–26.3)

## 2019-10-17 LAB — IGG, IGA, IGM
IgA: 488 mg/dL — ABNORMAL HIGH (ref 64–422)
IgG (Immunoglobin G), Serum: 1370 mg/dL (ref 586–1602)
IgM (Immunoglobulin M), Srm: 64 mg/dL (ref 26–217)

## 2019-10-17 LAB — PROTEIN ELECTROPHORESIS, SERUM
A/G Ratio: 1.1 (ref 0.7–1.7)
Albumin ELP: 3.9 g/dL (ref 2.9–4.4)
Alpha-1-Globulin: 0.2 g/dL (ref 0.0–0.4)
Alpha-2-Globulin: 0.7 g/dL (ref 0.4–1.0)
Beta Globulin: 0.9 g/dL (ref 0.7–1.3)
Gamma Globulin: 1.6 g/dL (ref 0.4–1.8)
Globulin, Total: 3.5 g/dL (ref 2.2–3.9)
M-Spike, %: 0.4 g/dL — ABNORMAL HIGH
Total Protein ELP: 7.4 g/dL (ref 6.0–8.5)

## 2019-10-20 ENCOUNTER — Encounter: Payer: Self-pay | Admitting: Oncology

## 2019-10-20 ENCOUNTER — Inpatient Hospital Stay (HOSPITAL_BASED_OUTPATIENT_CLINIC_OR_DEPARTMENT_OTHER): Payer: Medicare HMO | Admitting: Oncology

## 2019-10-20 ENCOUNTER — Other Ambulatory Visit: Payer: Self-pay

## 2019-10-20 DIAGNOSIS — D472 Monoclonal gammopathy: Secondary | ICD-10-CM | POA: Diagnosis not present

## 2019-10-20 NOTE — Progress Notes (Signed)
Patient denies any pain or concerns at this time.  

## 2019-10-23 ENCOUNTER — Other Ambulatory Visit: Payer: Medicare HMO

## 2019-10-25 NOTE — Progress Notes (Signed)
Virtual Visit via Telephone Note  I connected with Martha Peterson on 11/02/19 at  4:00 PM EDT by telephone and verified that I am speaking with the correct person using two identifiers.  Location: Patient: Home  Provider: Clinic  This service was conducted via virtual visit.  The patient was located at home. I was located in my office.  Consent was obtained prior to the virtual visit and is aware of possible charges through their insurance for this visit.  The patient is an established patient.  Dr. Estanislado Pandy, MD conducted the virtual visit and Hazel Sams, PA-C acted as scribe during the service.  Office staff helped with scheduling follow up visits after the service was conducted.   I discussed the limitations, risks, security and privacy concerns of performing an evaluation and management service by telephone and the availability of in person appointments. I also discussed with the patient that there may be a patient responsible charge related to this service. The patient expressed understanding and agreed to proceed.  CC: Medication monitoring  History of Present Illness: Patient is a 79 year old female with a past medical history of gout and osteoarthritis.  She is taking allopurinol 200 mg po daily for management of gout.  She denies any recent gout flares.  She experiences intermittent discomfort in his hands, left hip joint, and left knee joint.  She is not ready to proceed with scheduling a knee replacement at this time.  She states her right knee replacement is doing well.  She continues to have chronic lower back pain.  She denies any symptoms of radiculopathy.    Review of Systems  Constitutional: Negative for fever and malaise/fatigue.  HENT:       +Mouth dryness   Eyes: Negative for photophobia, pain, discharge and redness.  Respiratory: Negative for cough, shortness of breath and wheezing.   Cardiovascular: Negative for chest pain and palpitations.  Gastrointestinal:  Negative for blood in stool, constipation and diarrhea.  Genitourinary: Negative for dysuria.  Musculoskeletal: Positive for joint pain. Negative for back pain, myalgias and neck pain.       +Morning stiffness Denies joint swelling   Skin: Negative for rash.       +Easy bruising   Neurological: Negative for dizziness and headaches.  Psychiatric/Behavioral: Negative for depression. The patient is not nervous/anxious and does not have insomnia.       Observations/Objective: Physical Exam  Constitutional: She is oriented to person, place, and time.  Neurological: She is alert and oriented to person, place, and time.  Psychiatric: Mood, memory, affect and judgment normal.    Patient reports morning stiffness for 3 hour.   Patient denies nocturnal pain.  Difficulty dressing/grooming: Denies Difficulty climbing stairs: Reports Difficulty getting out of chair: Denies Difficulty using hands for taps, buttons, cutlery, and/or writing: Denies  Assessment and Plan: Visit Diagnoses: Primary osteoarthritis of both hands-She has intermittent discomfort in both hands.  She has not had any inflammation recently. She is taking Tumeric, tart cherry, omega 3, and ginger.  She uses voltaren gel topically as needed for pain relief.  Joint protection and muscle strengthening were discussed. She was advised to notify us if she develops increased joint pain or joint swelling.  She will follow up in 6 months.  Rheumatoid factor positive - RF 23.2 on Oct 28, 2017.  She has not had any joint inflammation.    Primary osteoarthritis of left hip-She has intermittent discomfort in her left hip joint.  Status post total knee replacement, right-Doing well. She has difficulty climbing steps but no difficulty getting up for a seated position.   Primary osteoarthritis of left knee-Severe osteoarthritis and valgus deformity. She has chronic left knee joint pain, but she is not ready to proceed with a knee  replacement at this time.  She uses voltaren gel topically as needed for pain relief.  She is also prescribed tramadol and oxycodone for pain relief.   History of gout - She has not had any recent gout flares.  She is clinically doing well on Allopurinol 200 mg po daily.  Her uric acid was 6.4 on 04/14/19.  She is due to updated uric acid.  Future order placed today. She will continue taking allopurinol as prescribed. She was advised to notify us if she develops signs or symptoms of a gout flare.  Osteopenia, unspecified location-She is taking vitamin D 1,000 units by mouth daily. She is due to update her DEXA, and she plans on further discussing with her PCP at her upcoming appointment in June 2021.   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  She is followed by Dr. Lucky Cowboy.  She continues to have voice hoarseness.    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 Grayland Ormond.   Left ventricular hypertrophy  Essential hypertension  History of COPD  History of pulmonary fibrosis  History of hypothyroidism   Follow Up Instructions: She will follow up in 6 months.    I discussed the assessment and treatment plan with the patient. The patient was provided an opportunity to ask questions and all were answered. The patient agreed with the plan and demonstrated an understanding of the instructions.   The patient was advised to call back or seek an in-person evaluation if the symptoms worsen or if the condition fails to improve as anticipated.  I provided 20 minutes of non-face-to-face time during this encounter.   Bo Merino, MD   Scribed by-  Hazel Sams, PA-C

## 2019-10-30 ENCOUNTER — Ambulatory Visit: Payer: Medicare HMO | Admitting: Oncology

## 2019-10-30 ENCOUNTER — Other Ambulatory Visit: Payer: Self-pay

## 2019-10-30 DIAGNOSIS — M153 Secondary multiple arthritis: Secondary | ICD-10-CM

## 2019-10-30 MED ORDER — TIZANIDINE HCL 4 MG PO TABS: 4.0000 mg | ORAL_TABLET | Freq: Two times a day (BID) | ORAL | 1 refills | Status: DC

## 2019-11-02 ENCOUNTER — Telehealth (INDEPENDENT_AMBULATORY_CARE_PROVIDER_SITE_OTHER): Payer: Medicare HMO | Admitting: Rheumatology

## 2019-11-02 ENCOUNTER — Encounter: Payer: Self-pay | Admitting: Rheumatology

## 2019-11-02 VITALS — Ht 63.0 in

## 2019-11-02 DIAGNOSIS — M19042 Primary osteoarthritis, left hand: Secondary | ICD-10-CM

## 2019-11-02 DIAGNOSIS — M19041 Primary osteoarthritis, right hand: Secondary | ICD-10-CM | POA: Diagnosis not present

## 2019-11-02 DIAGNOSIS — M8580 Other specified disorders of bone density and structure, unspecified site: Secondary | ICD-10-CM | POA: Diagnosis not present

## 2019-11-02 DIAGNOSIS — M1712 Unilateral primary osteoarthritis, left knee: Secondary | ICD-10-CM | POA: Diagnosis not present

## 2019-11-02 DIAGNOSIS — I6523 Occlusion and stenosis of bilateral carotid arteries: Secondary | ICD-10-CM

## 2019-11-02 DIAGNOSIS — I517 Cardiomegaly: Secondary | ICD-10-CM | POA: Diagnosis not present

## 2019-11-02 DIAGNOSIS — Z96651 Presence of right artificial knee joint: Secondary | ICD-10-CM

## 2019-11-02 DIAGNOSIS — Z8739 Personal history of other diseases of the musculoskeletal system and connective tissue: Secondary | ICD-10-CM | POA: Diagnosis not present

## 2019-11-02 DIAGNOSIS — Z8639 Personal history of other endocrine, nutritional and metabolic disease: Secondary | ICD-10-CM

## 2019-11-02 DIAGNOSIS — R768 Other specified abnormal immunological findings in serum: Secondary | ICD-10-CM

## 2019-11-02 DIAGNOSIS — D472 Monoclonal gammopathy: Secondary | ICD-10-CM

## 2019-11-02 DIAGNOSIS — M1612 Unilateral primary osteoarthritis, left hip: Secondary | ICD-10-CM

## 2019-11-02 DIAGNOSIS — Z8709 Personal history of other diseases of the respiratory system: Secondary | ICD-10-CM

## 2019-11-02 DIAGNOSIS — I10 Essential (primary) hypertension: Secondary | ICD-10-CM

## 2019-11-07 ENCOUNTER — Ambulatory Visit (INDEPENDENT_AMBULATORY_CARE_PROVIDER_SITE_OTHER): Payer: Medicare HMO | Admitting: Vascular Surgery

## 2019-11-07 ENCOUNTER — Other Ambulatory Visit: Payer: Self-pay

## 2019-11-07 ENCOUNTER — Ambulatory Visit (INDEPENDENT_AMBULATORY_CARE_PROVIDER_SITE_OTHER): Payer: Medicare HMO

## 2019-11-07 VITALS — BP 146/78 | HR 56 | Ht 63.0 in | Wt 195.0 lb

## 2019-11-07 DIAGNOSIS — I771 Stricture of artery: Secondary | ICD-10-CM

## 2019-11-07 DIAGNOSIS — K760 Fatty (change of) liver, not elsewhere classified: Secondary | ICD-10-CM | POA: Diagnosis not present

## 2019-11-07 DIAGNOSIS — I10 Essential (primary) hypertension: Secondary | ICD-10-CM | POA: Diagnosis not present

## 2019-11-07 DIAGNOSIS — I6523 Occlusion and stenosis of bilateral carotid arteries: Secondary | ICD-10-CM

## 2019-11-07 DIAGNOSIS — H34211 Partial retinal artery occlusion, right eye: Secondary | ICD-10-CM | POA: Diagnosis not present

## 2019-11-07 NOTE — Progress Notes (Signed)
MRN : [id number]  Martha Peterson is a 79 y.o. ([birth year]) female who presents with chief complaint of  Chief Complaint  Patient presents with  . Follow-up    U/S follow up  .  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  She has done quite well.  Her neck has healed completely and there is no significant residual bruising or swelling.  She has some mild numbness in her neck and her left-sided scar is a little thicker than the right, but otherwise she is no issues from that.  She still has some occasional right arm pain and tiredness but this is not that bothersome to her.  Duplex today shows a patent left to right carotid to carotid bypass with mild carotid artery disease bilaterally in the 1 to 39% range.  Current Outpatient Medications  Medication Sig Dispense Refill  . allopurinol (ZYLOPRIM) 100 MG tablet Take 2 tablets (200 mg total) by mouth daily. 180 tablet 1  . ascorbic acid (VITAMIN C) 500 MG tablet Take 500 mg by mouth daily.    Marland Kitchen aspirin EC 81 MG tablet Take 81 mg by mouth daily.    Marland Kitchen atorvastatin (LIPITOR) 10 MG tablet Take 1 tablet (10 mg total) by mouth daily at 6 PM. 90 tablet 1  . carvedilol (COREG) 25 MG tablet TAKE 1/2 TABLET TWICE DAILY WITH MEALS 90 tablet 1  . cholecalciferol (VITAMIN D) 400 UNITS TABS tablet Take 1,000 Units by mouth.    . cloNIDine (CATAPRES) 0.1 MG tablet Take 0.5 tablets (0.05 mg total) by mouth 2 (two) times daily. 180 tablet 1  . clopidogrel (PLAVIX) 75 MG tablet Take 1 tablet (75 mg total) by mouth daily. 30 tablet 11  . diclofenac sodium (VOLTAREN) 1 % GEL Apply 3 grams to 3 large joints, up to 3 times daily as needed. 3 Tube 3  . furosemide (LASIX) 40 MG tablet Take one tablet by mouth two times daily 180 tablet 1  . Ginger, Zingiber officinalis, (GINGER PO) Take by mouth.    Marland Kitchen ibuprofen  (ADVIL) 600 MG tablet Take 1 tablet (600 mg total) by mouth every 8 (eight) hours as needed. 90 tablet 1  . ketorolac (TORADOL) 30 MG/ML injection ketorolac 30 mg/mL injection syringe  Inject 1 mL by intramuscular route.    Marland Kitchen levocetirizine (XYZAL) 5 MG tablet TAKE 1 TABLET EVERY DAY  FOR  ALLERGIES 90 tablet 1  . levothyroxine (SYNTHROID) 137 MCG tablet Take 1 tablet (137 mcg total) by mouth daily before breakfast. 90 tablet 1  . losartan-hydrochlorothiazide (HYZAAR) 100-25 MG tablet Take 1 tablet by mouth daily. 90 tablet 1  . magnesium oxide (MAG-OX) 400 MG tablet Take 400 mg by mouth daily.     . meclizine (ANTIVERT) 12.5 MG tablet Take 12.5 mg by mouth as needed.     . Misc Natural Products (TART CHERRY ADVANCED PO) Take by mouth daily.    . Multiple Vitamins-Minerals (PRESERVISION AREDS 2 PO) Take by mouth 2 (two) times daily.    Marland Kitchen omega-3 acid ethyl esters (LOVAZA) 1 G capsule Take 1 g by mouth 2 (two) times daily.    Marland Kitchen omeprazole (PRILOSEC) 20 MG capsule Take one capsule by mouth two times daily 180 capsule 1  . oxyCODONE (OXY IR/ROXICODONE) 5 MG immediate release tablet Take 1 tablet (5 mg total) by mouth  2 (two) times daily as needed for severe pain. 45 tablet 0  . potassium chloride (KLOR-CON) 10 MEQ tablet Take 1 tablet (10 mEq total) by mouth daily. 90 tablet 1  . potassium chloride (KLOR-CON) 10 MEQ tablet Take 10 mEq by mouth daily.    . predniSONE (DELTASONE) 5 MG tablet Take 2 tablets (10 mg total) by mouth daily with breakfast. 180 tablet 1  . tiZANidine (ZANAFLEX) 4 MG tablet Take 1 tablet (4 mg total) by mouth 2 (two) times daily. 90 tablet 1  . traMADol (ULTRAM) 50 MG tablet Take 1 tablet (50 mg total) by mouth every 8 (eight) hours as needed. 90 tablet 2  . TURMERIC PO Take by mouth daily.    . vitamin B-12 (CYANOCOBALAMIN) 100 MCG tablet     . vitamin E 400 UNIT capsule Take 400 Units by mouth 2 (two) times daily.      No current facility-administered medications for this  visit.    Past Medical History:  Diagnosis Date  . Arthritis   . Asthma   . COPD (chronic obstructive pulmonary disease) ([place])   . Diverticulitis   . Gout   . Hyperlipidemia   . Hypertension   . Hypothyroidism   . 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 Microhematuria   . Mild mitral regurgitation   . Mild tricuspid regurgitation   . Osteopenia   . Pulmonary fibrosis ([place])   . Urinary incontinence     Past Surgical History:  Procedure Laterality Date  . ABDOMINAL HYSTERECTOMY    . ANKLE SURGERY Left   . APPENDECTOMY    . CAROTID PTA/STENT INTERVENTION N/A 07/17/2019   Procedure: CAROTID PTA/STENT INTERVENTION;  Surgeon: Algernon Huxley, MD;  Location: [facility];  Service: Cardiovascular;  Laterality: N/A;  . CAROTID-SUBCLAVIAN BYPASS GRAFT N/A 07/26/2019   Procedure: BYPASS GRAFT CAROTID-SUBCLAVIAN ( CAROTID TO CAROTID BYPASS);  Surgeon: Algernon Huxley, MD;  Location: [facility];  Service: Vascular;  Laterality: N/A;  . CHOLECYSTECTOMY    . COLONOSCOPY WITH PROPOFOL N/A 12/04/2014   Procedure: COLONOSCOPY WITH PROPOFOL;  Surgeon: Lollie Sails, MD;  Location: [facility];  Service: Endoscopy;  Laterality: N/A;  . FRACTURE SURGERY     Femur Fx; LT Ankle Pinning  . JOINT REPLACEMENT     RT TKR  . Patial Thyroid Resection       Social History   Tobacco Use  . Smoking status: Former Smoker    Years: 1.00    Types: Cigarettes  . Smokeless tobacco: Never Used  Substance Use Topics  . Alcohol use: No  . Drug use: No      Family History  Problem Relation Age of Onset  . Heart Problems Mother   . Heart Problems Father   . Diabetes Father   . Lupus Sister   . Thyroid disease Sister   . Heart Problems Sister   . Hypertension Son   . Diabetes Son        borderline   . Arthritis Daughter        in the left knee   . Diabetes Daughter   . Neuropathy Daughter   . Depression Daughter   . Anxiety disorder Daughter       Allergies  Allergen Reactions  . Codeine   . Levaquin [Levofloxacin]   . Tape     latex     REVIEW OF SYSTEMS (Negative unless checked)  Constitutional: [] ?Weight loss  [] ?Fever  [] ?Chills Cardiac: [] ?  Chest pain   [] ?Chest pressure   [] ?Palpitations   [] ?Shortness of breath when laying flat   [] ?Shortness of breath at rest   [x] ?Shortness of breath with exertion. Vascular:  [] ?Pain in legs with walking   [] ?Pain in legs at rest   [] ?Pain in legs when laying flat   [] ?Claudication   [] ?Pain in feet when walking  [] ?Pain in feet at rest  [] ?Pain in feet when laying flat   [] ?History of DVT   [] ?Phlebitis   [] ?Swelling in legs   [] ?Varicose veins   [] ?Non-healing ulcers Pulmonary:   [] ?Uses home oxygen   [] ?Productive cough   [] ?Hemoptysis   [] ?Wheeze  [x] ?COPD   [x] ?Asthma Neurologic:  [] ?Dizziness  [] ?Blackouts   [] ?Seizures   [] ?History of stroke   [] ?History of TIA  [] ?Aphasia   [] ?Temporary blindness   [] ?Dysphagia   [] ?Weakness or numbness in arms   [] ?Weakness or numbness in legs Musculoskeletal:  [x] ?Arthritis   [] ?Joint swelling   [] ?Joint pain   [] ?Low back pain Hematologic:  [] ?Easy bruising  [] ?Easy bleeding   [] ?Hypercoagulable state   [] ?Anemic  [] ?Hepatitis Gastrointestinal:  [] ?Blood in stool   [] ?Vomiting blood  [x] ?Gastroesophageal reflux/heartburn   [] ?Abdominal pain Genitourinary:  [] ?Chronic kidney disease   [] ?Difficult urination  [x] ?Frequent urination  [] ?Burning with urination   [] ?Hematuria Skin:  [] ?Rashes   [] ?Ulcers   [] ?Wounds Psychological:  [] ?History of anxiety   [] ? History of major depression.  Physical Examination  Vitals:   11/07/19 1422  BP: (!) 146/78  Pulse: (!) 56  Weight: 195 lb (88.5 kg)  Height: 5\' 3"  (1.6 m)   Body mass index is 34.54 kg/m. Gen:  WD/WN, NAD Head: [place]/AT, No temporalis wasting. Ear/Nose/Throat: Hearing grossly intact, nares w/o erythema or drainage, trachea midline Eyes: Conjunctiva clear. Sclera non-icteric Neck:  Supple.  Soft right carotid bruit  Pulmonary:  Good air movement, equal and clear to auscultation bilaterally.  Cardiac: RRR, No JVD Vascular:  Vessel Right Left  Radial  1+ palpable  2+ palpable           Musculoskeletal: M/S 5/5 throughout.  No deformity or atrophy.  Neurologic: CN 2-12 intact. Sensation grossly intact in extremities.  Symmetrical.  Speech is fluent. Motor exam as listed above. Psychiatric: Judgment intact, Mood & affect appropriate for pt's clinical situation. Dermatologic: Healed neck incisions are present.     CBC Lab Results  Component Value Date   WBC 4.4 10/16/2019   HGB 11.6 (L) 10/16/2019   HCT 35.1 (L) 10/16/2019   MCV 95.6 10/16/2019   PLT 136 (L) 10/16/2019    BMET    Component Value Date/Time   NA 137 10/16/2019 1102   NA 139 10/03/2019 1442   NA 135 (L) 02/02/2013 1038   K 3.6 10/16/2019 1102   K 3.8 02/02/2013 1038   CL 96 (L) 10/16/2019 1102   CL 102 02/02/2013 1038   CO2 29 10/16/2019 1102   CO2 29 02/02/2013 1038   GLUCOSE 106 (H) 10/16/2019 1102   GLUCOSE 111 (H) 02/02/2013 1038   BUN 37 (H) 10/16/2019 1102   BUN 18 10/03/2019 1442   BUN 15 02/02/2013 1038   CREATININE 1.42 (H) 10/16/2019 1102   CREATININE 0.99 02/20/2014 1019   CALCIUM 9.3 10/16/2019 1102   CALCIUM 8.9 02/20/2014 1019   GFRNONAA 35 (L) 10/16/2019 1102   GFRNONAA 56 (L) 02/20/2014 1019   GFRAA 40 (L) 10/16/2019 1102   GFRAA >60 02/20/2014 1019   CrCl cannot be calculated (  Patient's most recent lab result is older than the maximum 21 days allowed.).  COAG Lab Results  Component Value Date   INR 1.1 07/21/2019   INR 1.06 12/04/2014    Radiology No results found.   Assessment/Plan Essential hypertension blood pressure control important in reducing the progression of atherosclerotic disease. On appropriate oral medications.   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  This remains mild on duplex today.  Nonalcoholic fatty liver disease Her cardiologist has desired to go up on her statin, but due to her liver issues she has not been able to tolerate this.  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  This is most likely from her innominate artery lesion.  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  Widely patent bypass on duplex.  Continue current medical regimen.  Recheck in 6 months.    Leotis Pain, MD  11/07/2019 3:01 PM    This note was created with Dragon medical transcription system.  Any errors from dictation are purely unintentional

## 2019-11-07 NOTE — Assessment & Plan Note (Signed)
Status post left to right carotid to carotid bypass and doing well.  Widely patent bypass on duplex.  Continue current medical regimen.  Recheck in 6 months.

## 2019-11-17 ENCOUNTER — Other Ambulatory Visit: Payer: Self-pay

## 2019-11-17 MED ORDER — ATORVASTATIN CALCIUM 10 MG PO TABS: 10.0000 mg | ORAL_TABLET | Freq: Every day | ORAL | 1 refills | Status: DC

## 2019-11-23 ENCOUNTER — Telehealth: Payer: Self-pay

## 2019-11-23 NOTE — Telephone Encounter (Signed)
Confirmed and screened for 11-27-19 ov. 

## 2019-11-27 ENCOUNTER — Other Ambulatory Visit: Payer: Self-pay

## 2019-11-27 ENCOUNTER — Ambulatory Visit (INDEPENDENT_AMBULATORY_CARE_PROVIDER_SITE_OTHER): Payer: Medicare HMO | Admitting: Nurse Practitioner

## 2019-11-27 VITALS — BP 130/70 | HR 70 | Temp 97.5°F | Resp 16 | Ht 63.0 in | Wt 193.0 lb

## 2019-11-27 DIAGNOSIS — I10 Essential (primary) hypertension: Secondary | ICD-10-CM | POA: Diagnosis not present

## 2019-11-27 DIAGNOSIS — Z1231 Encounter for screening mammogram for malignant neoplasm of breast: Secondary | ICD-10-CM

## 2019-11-27 DIAGNOSIS — Z13820 Encounter for screening for osteoporosis: Secondary | ICD-10-CM

## 2019-11-27 DIAGNOSIS — M064 Inflammatory polyarthropathy: Secondary | ICD-10-CM

## 2019-11-27 DIAGNOSIS — W57XXXA Bitten or stung by nonvenomous insect and other nonvenomous arthropods, initial encounter: Secondary | ICD-10-CM

## 2019-11-27 MED ORDER — OXYCODONE HCL 5 MG PO TABS: 5.0000 mg | ORAL_TABLET | Freq: Three times a day (TID) | ORAL | 0 refills | Status: DC | PRN

## 2019-11-27 MED ORDER — DOXYCYCLINE HYCLATE 100 MG PO TABS: 100.0000 mg | ORAL_TABLET | Freq: Two times a day (BID) | ORAL | 0 refills | Status: DC

## 2019-11-27 NOTE — Progress Notes (Signed)
[facility] [place], [place] [address]  Internal MEDICINE  Office Visit Note  Patient Name: Martha Peterson  [id number]  [id number]  Date of Service: 12/06/2019  Chief Complaint  Patient presents with   Follow-up   COPD   Hyperlipidemia   Hypertension    The patient is here for routine follow up. She mentions she found a tick on her left hip on Friday afternoon. Area is itchy and red. She was able to get tick off of her. Had white spot on hits back. Unsure how long it has been on there. She denies development of fever, chills, headache, or body aches.  She has persistent and moderate lower back pain for which she alternates tramadol and oxycodone. Since having carotid bypass surgery, she admits to having increased pain which is not being controlled by tramadol at all.  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 She can take ibuprofen from time to time, but ability to do this is very limited as she takes plavix and baby aspirin daily.  The patient is due to have screening mammogram and bone density tests.       Current Medication: Outpatient Encounter Medications as of 11/27/2019  Medication Sig Note   allopurinol (ZYLOPRIM) 100 MG tablet Take 2 tablets (200 mg total) by mouth daily.    ascorbic acid (VITAMIN C) 500 MG tablet Take 500 mg by mouth daily.    aspirin EC 81 MG tablet Take 81 mg by mouth daily.    atorvastatin (LIPITOR) 10 MG tablet Take 1 tablet (10 mg total) by mouth daily at 6 PM.    carvedilol (COREG) 25 MG tablet TAKE 1/2 TABLET TWICE DAILY WITH MEALS    cholecalciferol (VITAMIN D) 400 UNITS TABS tablet Take 1,000 Units by mouth.    cloNIDine (CATAPRES) 0.1 MG tablet Take 0.5 tablets (0.05 mg total) by mouth 2 (two) times daily.    clopidogrel (PLAVIX) 75 MG tablet Take 1 tablet (75 mg total) by mouth daily.    diclofenac sodium (VOLTAREN) 1 % GEL Apply 3  grams to 3 large joints, up to 3 times daily as needed.    Ginger, Zingiber officinalis, (GINGER PO) Take by mouth.    ibuprofen (ADVIL) 600 MG tablet Take 1 tablet (600 mg total) by mouth every 8 (eight) hours as needed.    ketorolac (TORADOL) 30 MG/ML injection ketorolac 30 mg/mL injection syringe  Inject 1 mL by intramuscular route.    levocetirizine (XYZAL) 5 MG tablet TAKE 1 TABLET EVERY DAY  FOR  ALLERGIES    levothyroxine (SYNTHROID) 137 MCG tablet Take 1 tablet (137 mcg total) by mouth daily before breakfast.    magnesium oxide (MAG-OX) 400 MG tablet Take 400 mg by mouth daily.  08/20/2015: Received from: External Pharmacy   meclizine (ANTIVERT) 12.5 MG tablet Take 12.5 mg by mouth as needed.     Misc Natural Products (TART CHERRY ADVANCED PO) Take by mouth daily.    Multiple Vitamins-Minerals (PRESERVISION AREDS 2 PO) Take by mouth 2 (two) times daily.    omega-3 acid ethyl esters (LOVAZA) 1 G capsule Take 1 g by mouth 2 (two) times daily.    oxyCODONE (OXY IR/ROXICODONE) 5 MG immediate release tablet Take 1 tablet (5 mg total) by mouth 3 (three) times daily as needed for severe pain.    potassium chloride (KLOR-CON) 10 MEQ tablet Take 1 tablet (10 mEq  total) by mouth daily.    potassium chloride (KLOR-CON) 10 MEQ tablet Take 10 mEq by mouth daily.    predniSONE (DELTASONE) 5 MG tablet Take 2 tablets (10 mg total) by mouth daily with breakfast.    tiZANidine (ZANAFLEX) 4 MG tablet Take 1 tablet (4 mg total) by mouth 2 (two) times daily.    TURMERIC PO Take by mouth daily.    vitamin B-12 (CYANOCOBALAMIN) 100 MCG tablet     vitamin E 400 UNIT capsule Take 400 Units by mouth 2 (two) times daily.  08/20/2015: Received from: External Pharmacy   [DISCONTINUED] furosemide (LASIX) 40 MG tablet Take one tablet by mouth two times daily    [DISCONTINUED] losartan-hydrochlorothiazide (HYZAAR) 100-25 MG tablet Take 1 tablet by mouth daily.    [DISCONTINUED] omeprazole (PRILOSEC)  20 MG capsule Take one capsule by mouth two times daily    [DISCONTINUED] oxyCODONE (OXY IR/ROXICODONE) 5 MG immediate release tablet Take 1 tablet (5 mg total) by mouth 2 (two) times daily as needed for severe pain.    [DISCONTINUED] traMADol (ULTRAM) 50 MG tablet Take 1 tablet (50 mg total) by mouth every 8 (eight) hours as needed.    doxycycline (VIBRA-TABS) 100 MG tablet Take 1 tablet (100 mg total) by mouth 2 (two) times daily.    No facility-administered encounter medications on file as of 11/27/2019.    Surgical History: Past Surgical History:  Procedure Laterality Date   ABDOMINAL HYSTERECTOMY     ANKLE SURGERY Left    APPENDECTOMY     CAROTID PTA/STENT INTERVENTION N/A 07/17/2019   Procedure: CAROTID PTA/STENT INTERVENTION;  Surgeon: Algernon Huxley, MD;  Location: [facility];  Service: Cardiovascular;  Laterality: N/A;   CAROTID-SUBCLAVIAN BYPASS GRAFT N/A 07/26/2019   Procedure: BYPASS GRAFT CAROTID-SUBCLAVIAN ( CAROTID TO CAROTID BYPASS);  Surgeon: Algernon Huxley, MD;  Location: [facility];  Service: Vascular;  Laterality: N/A;   CHOLECYSTECTOMY     COLONOSCOPY WITH PROPOFOL N/A 12/04/2014   Procedure: COLONOSCOPY WITH PROPOFOL;  Surgeon: Lollie Sails, MD;  Location: [facility];  Service: Endoscopy;  Laterality: N/A;   FRACTURE SURGERY     Femur Fx; LT Ankle Pinning   JOINT REPLACEMENT     RT TKR   Patial Thyroid Resection      Medical History: Past Medical History:  Diagnosis Date   Arthritis    Asthma    COPD (chronic obstructive pulmonary disease) (HCC)    Diverticulitis    Gout    Hyperlipidemia    Hypertension    Hypothyroidism    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   Microhematuria    Mild mitral regurgitation    Mild tricuspid regurgitation    Osteopenia    Pulmonary fibrosis (HCC)    Urinary incontinence     Family History: Family History  Problem Relation Age of Onset   Heart Problems  Mother    Heart Problems Father    Diabetes Father    Lupus Sister    Thyroid disease Sister    Heart Problems Sister    Hypertension Son    Diabetes Son        borderline    Arthritis Daughter        in the left knee    Diabetes Daughter    Neuropathy Daughter    Depression Daughter    Anxiety disorder Daughter     Social History   Socioeconomic History   Marital status: Married    Spouse  name: Jeneen Rinks   Number of children: Not on file   Years of education: Not on file   Highest education level: Not on file  Occupational History   Not on file  Tobacco Use   Smoking status: Former Smoker    Years: 1.00    Types: Cigarettes   Smokeless tobacco: Never Used  Scientific laboratory technician Use: Never used  Substance and Sexual Activity   Alcohol use: No   Drug use: No   Sexual activity: Not Currently  Other Topics Concern   Not on file  Social History Narrative   Lives at home with husband in private residence   Social Determinants of Health   Financial Resource Strain:    Difficulty of Paying Living Expenses:   Food Insecurity:    Worried About Charity fundraiser in the Last Year:    Arboriculturist in the Last Year:   Transportation Needs:    Film/video editor (Medical):    Lack of Transportation (Non-Medical):   Physical Activity:    Days of Exercise per Week:    Minutes of Exercise per Session:   Stress:    Feeling of Stress :   Social Connections:    Frequency of Communication with Friends and Family:    Frequency of Social Gatherings with Friends and Family:    Attends Religious Services:    Active Member of Clubs or Organizations:    Attends Music therapist:    Marital Status:   Intimate Partner Violence:    Fear of Current or Ex-Partner:    Emotionally Abused:    Physically Abused:    Sexually Abused:       Review of Systems  Constitutional: Positive for fatigue. Negative for activity  change, chills and unexpected weight change.       Improving fatigue.   HENT: Negative for congestion, postnasal drip, rhinorrhea, sneezing, sore throat, trouble swallowing and voice change.   Respiratory: Positive for shortness of breath. Negative for cough, chest tightness and wheezing.        With exertion.   Cardiovascular: Negative for chest pain and palpitations.       Blood pressure is stable.   Gastrointestinal: Negative for abdominal pain, constipation, diarrhea, nausea and vomiting.  Endocrine: Negative for cold intolerance, heat intolerance, polydipsia and polyuria.  Musculoskeletal: Positive for myalgias and neck pain. Negative for arthralgias, back pain and joint swelling.       Improved neck tenderness after surgery. Continues to have severe lower back pain.   Skin: Negative for rash.       Insect bite to right hip. Pulled tick off of area a few days ago. The area is red and itchy.   Allergic/Immunologic: Negative for environmental allergies.  Neurological: Positive for headaches. Negative for dizziness, tremors and numbness.       Gets mild headache for a few moments sometimes after standing up from seated or laying position.   Hematological: Negative for adenopathy. Does not bruise/bleed easily.  Psychiatric/Behavioral: Negative for behavioral problems (Depression), sleep disturbance and suicidal ideas. The patient is not nervous/anxious.     Today's Vitals   11/27/19 1400  BP: 130/70  Pulse: 70  Resp: 16  Temp: (!) 97.5 F (36.4 C)  SpO2: 98%  Weight: 193 lb (87.5 kg)  Height: 5\' 3"  (1.6 m)   Body mass index is 34.19 kg/m.  Physical Exam Vitals and nursing note reviewed.  Constitutional:  General: She is not in acute distress.    Appearance: Normal appearance. She is well-developed. She is not diaphoretic.  HENT:     Head: Normocephalic and atraumatic.     Nose: Nose normal.     Mouth/Throat:     Pharynx: No oropharyngeal exudate.  Eyes:     Pupils:  Pupils are equal, round, and reactive to light.  Neck:     Thyroid: No thyromegaly.     Vascular: No JVD.     Trachea: No tracheal deviation.     Comments: Well healing surgical incision from carotid to carotid bypass Cardiovascular:     Rate and Rhythm: Normal rate and regular rhythm.     Heart sounds: Normal heart sounds. No murmur heard.  No friction rub. No gallop.   Pulmonary:     Effort: Pulmonary effort is normal. No respiratory distress.     Breath sounds: Normal breath sounds. No wheezing or rales.  Chest:     Chest wall: No tenderness.  Abdominal:     Palpations: Abdomen is soft.  Musculoskeletal:        General: Normal range of motion.     Cervical back: Normal range of motion and neck supple.     Comments: Having severe low back pain, worse with bending and twisting at the waist. Worse with reaching, pushing, pulling, and lifting with the arms.     Lymphadenopathy:     Cervical: No cervical adenopathy.  Skin:    General: Skin is warm and dry.     Comments: There is evidence of insect bite on right lateral hip area. There is scan in center and redness surrounding the area of bite. No evidence of cellulitis or infection noted at thsi time.   Neurological:     General: No focal deficit present.     Mental Status: She is alert and oriented to person, place, and time.     Cranial Nerves: No cranial nerve deficit.  Psychiatric:        Behavior: Behavior normal.        Thought Content: Thought content normal.        Judgment: Judgment normal.   Assessment/Plan: 1. Essential hypertension Stable. Continue all meds as prescribed. Continue to monitor closely.   2. Tick bite of left hip, initial encounter Round doxycycline 100mg  twice daily for next 14 days. Will see back if no improvement or worsening symptoms over next few weeks.  - doxycycline (VIBRA-TABS) 100 MG tablet; Take 1 tablet (100 mg total) by mouth 2 (two) times daily.  Dispense: 28 tablet; Refill: 0  3.  Inflammatory polyarthritis (HCC) Increase frequency of oxycodone 5mg  to three times daily as needed for pain as prescription for tramadol is not helping symptoms at all. Advised she use only as needed and monitor for negative side effects closely.  - oxyCODONE (OXY IR/ROXICODONE) 5 MG immediate release tablet; Take 1 tablet (5 mg total) by mouth 3 (three) times daily as needed for severe pain.  Dispense: 90 tablet; Refill: 0  4. Encounter for screening mammogram for malignant neoplasm of breast - MM DIGITAL SCREENING BILATERAL; Future  5. Screening for osteoporosis - DG Bone Density; Future  General Counseling: kyndahl jablon understanding of the findings of todays visit and agrees with plan of treatment. I have discussed any further diagnostic evaluation that may be needed or ordered today. We also reviewed her medications today. she has been encouraged to call the office with any questions or concerns that should  arise related to todays visit.  Reviewed risks and possible side effects associated with taking opiates, benzodiazepines and other CNS depressants. Combination of these could cause dizziness and drowsiness. Advised patient not to drive or operate machinery when taking these medications, as patient's and other's life can be at risk and will have consequences. Patient verbalized understanding in this matter. Dependence and abuse for these drugs will be monitored closely. A Controlled substance policy and procedure is on file which allows [facility] to order a urine drug screen test at any visit. Patient understands and agrees with the plan  This patient was seen by [name] FNP Collaboration with Dr [name] as a part of collaborative care agreement  Orders Placed This Encounter  Procedures   DG Bone Density   MM DIGITAL SCREENING BILATERAL    Meds ordered this encounter  Medications   oxyCODONE (OXY IR/ROXICODONE) 5 MG immediate release tablet    Sig:  Take 1 tablet (5 mg total) by mouth 3 (three) times daily as needed for severe pain.    Dispense:  90 tablet    Refill:  0    Will d/c tramadol as this has been ineffective. Increased dose oxycodone    Order Specific Question:   Supervising Provider    Answer:   [name] [[id number]]   doxycycline (VIBRA-TABS) 100 MG tablet    Sig: Take 1 tablet (100 mg total) by mouth 2 (two) times daily.    Dispense:  28 tablet    Refill:  0    Order Specific Question:   Supervising Provider    Answer:   [name] [[id number]]    Total time spent: 30 Minutes   Time spent includes review of chart, medications, test results, and follow up plan with the patient.      Dr [name] Internal medicine

## 2019-11-30 ENCOUNTER — Other Ambulatory Visit: Payer: Self-pay

## 2019-11-30 MED ORDER — LOSARTAN POTASSIUM-HCTZ 100-25 MG PO TABS: 1.0000 | ORAL_TABLET | Freq: Every day | ORAL | 1 refills | Status: DC

## 2019-11-30 MED ORDER — OMEPRAZOLE 20 MG PO CPDR: DELAYED_RELEASE_CAPSULE | ORAL | 1 refills | Status: DC

## 2019-11-30 MED ORDER — FUROSEMIDE 40 MG PO TABS: ORAL_TABLET | ORAL | 1 refills | Status: DC

## 2019-12-06 ENCOUNTER — Encounter: Payer: Self-pay | Admitting: Nurse Practitioner

## 2019-12-06 DIAGNOSIS — Z1231 Encounter for screening mammogram for malignant neoplasm of breast: Secondary | ICD-10-CM | POA: Insufficient documentation

## 2019-12-06 DIAGNOSIS — S70262A Insect bite (nonvenomous), left hip, initial encounter: Secondary | ICD-10-CM | POA: Insufficient documentation

## 2019-12-06 DIAGNOSIS — Z13820 Encounter for screening for osteoporosis: Secondary | ICD-10-CM | POA: Insufficient documentation

## 2019-12-06 DIAGNOSIS — W57XXXA Bitten or stung by nonvenomous insect and other nonvenomous arthropods, initial encounter: Secondary | ICD-10-CM | POA: Insufficient documentation

## 2019-12-14 DIAGNOSIS — Z780 Asymptomatic menopausal state: Secondary | ICD-10-CM | POA: Diagnosis not present

## 2019-12-14 DIAGNOSIS — M85852 Other specified disorders of bone density and structure, left thigh: Secondary | ICD-10-CM | POA: Diagnosis not present

## 2019-12-14 DIAGNOSIS — Z1231 Encounter for screening mammogram for malignant neoplasm of breast: Secondary | ICD-10-CM | POA: Diagnosis not present

## 2019-12-22 ENCOUNTER — Telehealth: Payer: Self-pay

## 2019-12-22 NOTE — Telephone Encounter (Signed)
lmom to confirm and screen for 12-26-19 ov.

## 2019-12-25 ENCOUNTER — Ambulatory Visit: Payer: Medicare HMO | Admitting: Nurse Practitioner

## 2019-12-26 ENCOUNTER — Other Ambulatory Visit: Payer: Self-pay

## 2019-12-26 ENCOUNTER — Encounter: Payer: Self-pay | Admitting: Nurse Practitioner

## 2019-12-26 ENCOUNTER — Ambulatory Visit (INDEPENDENT_AMBULATORY_CARE_PROVIDER_SITE_OTHER): Payer: Medicare HMO | Admitting: Nurse Practitioner

## 2019-12-26 VITALS — BP 117/47 | HR 65 | Temp 97.4°F | Resp 16 | Ht 63.0 in | Wt 191.8 lb

## 2019-12-26 DIAGNOSIS — I10 Essential (primary) hypertension: Secondary | ICD-10-CM | POA: Diagnosis not present

## 2019-12-26 DIAGNOSIS — K760 Fatty (change of) liver, not elsewhere classified: Secondary | ICD-10-CM | POA: Diagnosis not present

## 2019-12-26 DIAGNOSIS — M064 Inflammatory polyarthropathy: Secondary | ICD-10-CM | POA: Diagnosis not present

## 2019-12-26 MED ORDER — OXYCODONE HCL 5 MG PO TABS: 5.0000 mg | ORAL_TABLET | Freq: Three times a day (TID) | ORAL | 0 refills | Status: DC | PRN

## 2019-12-26 NOTE — Progress Notes (Signed)
[facility] [place], [place] [address]  Internal MEDICINE  Office Visit Note  Patient Name: Martha Peterson  [id number]  [id number]  Date of Service: 01/07/2020  Chief Complaint  Patient presents with  . Follow-up  . Hypertension  . Hyperlipidemia  . COPD  . Quality Metric Gaps    TDAP    The patient is here for routine follow up visit. Increased her pain medication to take up to three times daily as needed for severe pain. She had been having increased pain which is not being controlled by tramadol at all.  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 She can take ibuprofen from time to time, but ability to do this is very limited as she takes plavix and baby aspirin daily. She states that this increased dosing has been helpful. She generally does not take more that two a day, but is thanful for the third dose when it's needed.  Since her last visit, she had mammogram and bone density test. Mammogram was benign. Bone density is normal in lumbar spine. There is osteopenia in the left proximal femur. She takes calcium 1000mg  daily. She also takes two vitamin d supplements every day. She does do low impact exercise.        Current Medication: Outpatient Encounter Medications as of 12/26/2019  Medication Sig Note  . allopurinol (ZYLOPRIM) 100 MG tablet Take 2 tablets (200 mg total) by mouth daily.   Marland Kitchen ascorbic acid (VITAMIN C) 500 MG tablet Take 500 mg by mouth daily.   Marland Kitchen aspirin EC 81 MG tablet Take 81 mg by mouth daily.   Marland Kitchen atorvastatin (LIPITOR) 10 MG tablet Take 1 tablet (10 mg total) by mouth daily at 6 PM.   . carvedilol (COREG) 25 MG tablet TAKE 1/2 TABLET TWICE DAILY WITH MEALS   . cholecalciferol (VITAMIN D) 400 UNITS TABS tablet Take 1,000 Units by mouth.   . cloNIDine (CATAPRES) 0.1 MG tablet Take 0.5 tablets (0.05 mg total) by mouth 2 (two) times daily.   . clopidogrel (PLAVIX)  75 MG tablet Take 1 tablet (75 mg total) by mouth daily.   . diclofenac sodium (VOLTAREN) 1 % GEL Apply 3 grams to 3 large joints, up to 3 times daily as needed.   . furosemide (LASIX) 40 MG tablet Take one tablet by mouth two times daily   . Ginger, Zingiber officinalis, (GINGER PO) Take by mouth.   Marland Kitchen ibuprofen (ADVIL) 600 MG tablet Take 1 tablet (600 mg total) by mouth every 8 (eight) hours as needed.   Marland Kitchen ketorolac (TORADOL) 30 MG/ML injection ketorolac 30 mg/mL injection syringe  Inject 1 mL by intramuscular route.   Marland Kitchen levocetirizine (XYZAL) 5 MG tablet TAKE 1 TABLET EVERY DAY  FOR  ALLERGIES   . levothyroxine (SYNTHROID) 137 MCG tablet Take 1 tablet (137 mcg total) by mouth daily before breakfast.   . losartan-hydrochlorothiazide (HYZAAR) 100-25 MG tablet Take 1 tablet by mouth daily.   . magnesium oxide (MAG-OX) 400 MG tablet Take 400 mg by mouth daily.  08/20/2015: Received from: External Pharmacy  . meclizine (ANTIVERT) 12.5 MG tablet Take 12.5 mg by mouth as needed.    . Misc Natural Products (TART CHERRY ADVANCED PO) Take by mouth daily.   . Multiple Vitamins-Minerals (PRESERVISION AREDS 2 PO) Take by mouth 2 (two) times daily.   Marland Kitchen omega-3 acid ethyl esters (LOVAZA) 1 G capsule  Take 1 g by mouth 2 (two) times daily.   Marland Kitchen omeprazole (PRILOSEC) 20 MG capsule Take one capsule by mouth two times daily   . oxyCODONE (OXY IR/ROXICODONE) 5 MG immediate release tablet Take 1 tablet (5 mg total) by mouth 3 (three) times daily as needed for severe pain.   . potassium chloride (KLOR-CON) 10 MEQ tablet Take 1 tablet (10 mEq total) by mouth daily.   . potassium chloride (KLOR-CON) 10 MEQ tablet Take 10 mEq by mouth daily.   . predniSONE (DELTASONE) 5 MG tablet Take 2 tablets (10 mg total) by mouth daily with breakfast.   . TURMERIC PO Take by mouth daily.   . vitamin B-12 (CYANOCOBALAMIN) 100 MCG tablet    . vitamin E 400 UNIT capsule Take 400 Units by mouth 2 (two) times daily.  08/20/2015: Received  from: External Pharmacy  . [DISCONTINUED] doxycycline (VIBRA-TABS) 100 MG tablet Take 1 tablet (100 mg total) by mouth 2 (two) times daily.   . [DISCONTINUED] oxyCODONE (OXY IR/ROXICODONE) 5 MG immediate release tablet Take 1 tablet (5 mg total) by mouth 3 (three) times daily as needed for severe pain.   . [DISCONTINUED] oxyCODONE (OXY IR/ROXICODONE) 5 MG immediate release tablet Take 1 tablet (5 mg total) by mouth 3 (three) times daily as needed for severe pain.   . [DISCONTINUED] tiZANidine (ZANAFLEX) 4 MG tablet Take 1 tablet (4 mg total) by mouth 2 (two) times daily.    No facility-administered encounter medications on file as of 12/26/2019.    Surgical History: Past Surgical History:  Procedure Laterality Date  . ABDOMINAL HYSTERECTOMY    . ANKLE SURGERY Left   . APPENDECTOMY    . CAROTID PTA/STENT INTERVENTION N/A 07/17/2019   Procedure: CAROTID PTA/STENT INTERVENTION;  Surgeon: Algernon Huxley, MD;  Location: [facility];  Service: Cardiovascular;  Laterality: N/A;  . CAROTID-SUBCLAVIAN BYPASS GRAFT N/A 07/26/2019   Procedure: BYPASS GRAFT CAROTID-SUBCLAVIAN ( CAROTID TO CAROTID BYPASS);  Surgeon: Algernon Huxley, MD;  Location: [facility];  Service: Vascular;  Laterality: N/A;  . CHOLECYSTECTOMY    . COLONOSCOPY WITH PROPOFOL N/A 12/04/2014   Procedure: COLONOSCOPY WITH PROPOFOL;  Surgeon: Lollie Sails, MD;  Location: [facility] END[facility]COPY;  Service: Endoscopy;  Laterality: N/A;  . FRACTURE SURGERY     Femur Fx; LT Ankle Pinning  . JOINT REPLACEMENT     RT TKR  . Patial Thyroid Resection      Medical History: Past Medical History:  Diagnosis Date  . Arthritis   . Asthma   . COPD (chronic obstructive pulmonary disease) ([place])   . Diverticulitis   . Gout   . Hyperlipidemia   . Hypertension   . Hypothyroidism   . 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 Microhematuria   . Mild mitral regurgitation   . Mild tricuspid regurgitation   . Osteopenia   .  Pulmonary fibrosis ([place])   . Urinary incontinence     Family History: Family History  Problem Relation Age of Onset  . Heart Problems Mother   . Heart Problems Father   . Diabetes Father   . Lupus Sister   . Thyroid disease Sister   . Heart Problems Sister   . Hypertension Son   . Diabetes Son        borderline   . Arthritis Daughter        in the left knee   . Diabetes Daughter   . Neuropathy Daughter   . Depression  Daughter   . Anxiety disorder Daughter     Social History   Socioeconomic History  . Marital status: Married    Spouse name: Jeneen Rinks  . Number of children: Not on file  . Years of education: Not on file  . Highest education level: Not on file  Occupational History  . Not on file  Tobacco Use  . Smoking status: Former Smoker    Years: 1.00    Types: Cigarettes  . Smokeless tobacco: Never Used  Vaping Use  . Vaping Use: Never used  Substance and Sexual Activity  . Alcohol use: No  . Drug use: No  . Sexual activity: Not Currently  Other Topics Concern  . Not on file  Social History Narrative   Lives at home with husband in private residence   Social Determinants of Health   Financial Resource Strain:   . Difficulty of Paying Living Expenses:   Food Insecurity:   . Worried About Charity fundraiser in the Last Year:   . Arboriculturist in the Last Year:   Transportation Needs:   . Film/video editor (Medical):   Marland Kitchen Lack of Transportation (Non-Medical):   Physical Activity:   . Days of Exercise per Week:   . Minutes of Exercise per Session:   Stress:   . Feeling of Stress :   Social Connections:   . Frequency of Communication with Friends and Family:   . Frequency of Social Gatherings with Friends and Family:   . Attends Religious Services:   . Active Member of Clubs or Organizations:   . Attends Archivist Meetings:   Marland Kitchen Marital Status:   Intimate Partner Violence:   . Fear of Current or Ex-Partner:   . Emotionally Abused:    Marland Kitchen Physically Abused:   . Sexually Abused:       Review of Systems  Constitutional: Positive for fatigue. Negative for activity change, chills and unexpected weight change.       Improving fatigue.   HENT: Negative for congestion, postnasal drip, rhinorrhea, sneezing, sore throat, trouble swallowing and voice change.   Respiratory: Positive for shortness of breath. Negative for cough, chest tightness and wheezing.        With exertion.   Cardiovascular: Negative for chest pain and palpitations.       Blood pressure is stable.   Gastrointestinal: Negative for abdominal pain, constipation, diarrhea, nausea and vomiting.  Endocrine: Negative for cold intolerance, heat intolerance, polydipsia and polyuria.  Musculoskeletal: Positive for myalgias and neck pain. Negative for arthralgias, back pain and joint swelling.        Improved neck tenderness after surgery. Continues to have severe lower back pain. Has done well with increased dosing of oxycodone. This has improved her low back pain some.   Skin: Negative for rash.  Allergic/Immunologic: Negative for environmental allergies.  Neurological: Positive for headaches. Negative for dizziness, tremors and numbness.       Gets mild headache for a few moments sometimes after standing up from seated or laying position.   Hematological: Negative for adenopathy. Does not bruise/bleed easily.  Psychiatric/Behavioral: Negative for behavioral problems (Depression), sleep disturbance and suicidal ideas. The patient is not nervous/anxious.     Today's Vitals   12/26/19 1504  BP: (!) 117/47  Pulse: 65  Resp: 16  Temp: (!) 97.4 F (36.3 C)  SpO2: 96%  Weight: 191 lb 12.8 oz (87 kg)  Height: 5\' 3"  (1.6 m)   Body mass  index is 33.98 kg/m.  Physical Exam Vitals and nursing note reviewed.  Constitutional:      General: She is not in acute distress.    Appearance: Normal appearance. She is well-developed. She is not diaphoretic.  HENT:      Head: Normocephalic and atraumatic.     Nose: Nose normal.     Mouth/Throat:     Pharynx: No oropharyngeal exudate.  Eyes:     Pupils: Pupils are equal, round, and reactive to light.  Neck:     Thyroid: No thyromegaly.     Vascular: No JVD.     Trachea: No tracheal deviation.     Comments: Well healing surgical incision from carotid to carotid bypass Cardiovascular:     Rate and Rhythm: Normal rate and regular rhythm.     Heart sounds: Normal heart sounds. No murmur heard.  No friction rub. No gallop.   Pulmonary:     Effort: Pulmonary effort is normal. No respiratory distress.     Breath sounds: Normal breath sounds. No wheezing or rales.  Chest:     Chest wall: No tenderness.  Abdominal:     Palpations: Abdomen is soft.  Musculoskeletal:        General: Normal range of motion.     Cervical back: Normal range of motion and neck supple.     Comments: Having severe low back pain, worse with bending and twisting at the waist. Worse with reaching, pushing, pulling, and lifting with the arms.  Increased pain medication dosing has improved overall pain management.    Lymphadenopathy:     Cervical: No cervical adenopathy.  Skin:    General: Skin is warm and dry.  Neurological:     General: No focal deficit present.     Mental Status: She is alert and oriented to person, place, and time.     Cranial Nerves: No cranial nerve deficit.  Psychiatric:        Behavior: Behavior normal.        Thought Content: Thought content normal.        Judgment: Judgment normal.    Assessment/Plan: 1. Essential hypertension Stable. Continue blood pressure medication as prescribed   2. Inflammatory polyarthritis ([place]) Improved with increased dose oxycodone. May continue to take up to three times daily as needed for severe pain. Two 30 day prescriptions provided. Dates are 12/26/2019 and 01/24/2020.  - oxyCODONE (OXY IR/ROXICODONE) 5 MG immediate release tablet; Take 1 tablet (5 mg total) by mouth 3  (three) times daily as needed for severe pain.  Dispense: 90 tablet; Refill: 0  3. Nonalcoholic fatty liver disease Stable. Continue regular visits with GI provider as scheduled.   General Counseling: jamine wingate understanding of the findings of todays visit and agrees with plan of treatment. I have discussed any further diagnostic evaluation that may be needed or ordered today. We also reviewed her medications today. she has been encouraged to call the office with any questions or concerns that should arise related to todays visit.   This patient was seen by [facility] with Dr [name] as a part of collaborative care agreement  Meds ordered this encounter  Medications  . DISCONTD: oxyCODONE (OXY IR/ROXICODONE) 5 MG immediate release tablet    Sig: Take 1 tablet (5 mg total) by mouth 3 (three) times daily as needed for severe pain.    Dispense:  90 tablet    Refill:  0    Will d/c tramadol as  this has been ineffective. Increased dose oxycodone    Order Specific Question:   Supervising Provider    Answer:   [name] [[id number]]  . oxyCODONE (OXY IR/ROXICODONE) 5 MG immediate release tablet    Sig: Take 1 tablet (5 mg total) by mouth 3 (three) times daily as needed for severe pain.    Dispense:  90 tablet    Refill:  0    Fill after 01/24/2020    Order Specific Question:   Supervising Provider    Answer:   [name] [[id number]]    Total time spent: 25 Minutes   Time spent includes review of chart, medications, test results, and follow up plan with the patient.      Dr [name] Internal medicine

## 2020-01-04 ENCOUNTER — Other Ambulatory Visit: Payer: Self-pay

## 2020-01-04 DIAGNOSIS — M153 Secondary multiple arthritis: Secondary | ICD-10-CM

## 2020-01-04 MED ORDER — TIZANIDINE HCL 4 MG PO TABS: 4.0000 mg | ORAL_TABLET | Freq: Two times a day (BID) | ORAL | 1 refills | Status: DC

## 2020-02-06 ENCOUNTER — Other Ambulatory Visit: Payer: Self-pay | Admitting: Nurse Practitioner

## 2020-02-06 DIAGNOSIS — D529 Folate deficiency anemia, unspecified: Secondary | ICD-10-CM | POA: Diagnosis not present

## 2020-02-06 DIAGNOSIS — D509 Iron deficiency anemia, unspecified: Secondary | ICD-10-CM | POA: Diagnosis not present

## 2020-02-06 DIAGNOSIS — Z0001 Encounter for general adult medical examination with abnormal findings: Secondary | ICD-10-CM | POA: Diagnosis not present

## 2020-02-06 DIAGNOSIS — I10 Essential (primary) hypertension: Secondary | ICD-10-CM | POA: Diagnosis not present

## 2020-02-06 DIAGNOSIS — E782 Mixed hyperlipidemia: Secondary | ICD-10-CM | POA: Diagnosis not present

## 2020-02-06 DIAGNOSIS — E559 Vitamin D deficiency, unspecified: Secondary | ICD-10-CM | POA: Diagnosis not present

## 2020-02-06 DIAGNOSIS — D510 Vitamin B12 deficiency anemia due to intrinsic factor deficiency: Secondary | ICD-10-CM | POA: Diagnosis not present

## 2020-02-07 LAB — CBC
Hematocrit: 35.9 % (ref 34.0–46.6)
Hemoglobin: 12.5 g/dL (ref 11.1–15.9)
MCH: 32.9 pg (ref 26.6–33.0)
MCHC: 34.8 g/dL (ref 31.5–35.7)
MCV: 95 fL (ref 79–97)
Platelets: 158 10*3/uL (ref 150–450)
RBC: 3.8 x10E6/uL (ref 3.77–5.28)
RDW: 13.1 % (ref 11.7–15.4)
WBC: 3.7 10*3/uL (ref 3.4–10.8)

## 2020-02-07 LAB — COMPREHENSIVE METABOLIC PANEL
ALT: 22 IU/L (ref 0–32)
AST: 37 IU/L (ref 0–40)
Albumin/Globulin Ratio: 1.3 (ref 1.2–2.2)
Albumin: 4.3 g/dL (ref 3.6–4.6)
Alkaline Phosphatase: 146 IU/L — ABNORMAL HIGH (ref 48–121)
BUN/Creatinine Ratio: 33 — ABNORMAL HIGH (ref 12–28)
BUN: 33 mg/dL — ABNORMAL HIGH (ref 8–27)
Bilirubin Total: 0.9 mg/dL (ref 0.0–1.2)
CO2: 27 mmol/L (ref 20–29)
Calcium: 10.1 mg/dL (ref 8.7–10.3)
Chloride: 93 mmol/L — ABNORMAL LOW (ref 96–106)
Creatinine, Ser: 1.01 mg/dL — ABNORMAL HIGH (ref 0.57–1.00)
GFR calc Af Amer: 60 mL/min/{1.73_m2} (ref >59)
GFR calc non Af Amer: 52 mL/min/{1.73_m2} — ABNORMAL LOW (ref >59)
Globulin, Total: 3.3 g/dL (ref 1.5–4.5)
Glucose: 107 mg/dL — ABNORMAL HIGH (ref 65–99)
Potassium: 3.7 mmol/L (ref 3.5–5.2)
Sodium: 138 mmol/L (ref 134–144)
Total Protein: 7.6 g/dL (ref 6.0–8.5)

## 2020-02-07 LAB — LIPID PANEL WITH LDL/HDL RATIO
Cholesterol, Total: 147 mg/dL (ref 100–199)
HDL: 46 mg/dL (ref >39)
LDL Chol Calc (NIH): 77 mg/dL (ref 0–99)
LDL/HDL Ratio: 1.7 ratio (ref 0.0–3.2)
Triglycerides: 140 mg/dL (ref 0–149)
VLDL Cholesterol Cal: 24 mg/dL (ref 5–40)

## 2020-02-07 LAB — TSH: TSH: 1.15 u[IU]/mL (ref 0.450–4.500)

## 2020-02-07 LAB — IRON AND TIBC
Iron Saturation: 23 % (ref 15–55)
Iron: 80 ug/dL (ref 27–139)
Total Iron Binding Capacity: 343 ug/dL (ref 250–450)
UIBC: 263 ug/dL (ref 118–369)

## 2020-02-07 LAB — B12 AND FOLATE PANEL
Folate: 18.5 ng/mL (ref >3.0)
Vitamin B-12: 885 pg/mL (ref 232–1245)

## 2020-02-07 LAB — VITAMIN D 25 HYDROXY (VIT D DEFICIENCY, FRACTURES): Vit D, 25-Hydroxy: 41 ng/mL (ref 30.0–100.0)

## 2020-02-07 LAB — T4, FREE: Free T4: 1.88 ng/dL — ABNORMAL HIGH (ref 0.82–1.77)

## 2020-02-07 LAB — FERRITIN: Ferritin: 145 ng/mL (ref 15–150)

## 2020-02-09 ENCOUNTER — Telehealth: Payer: Self-pay

## 2020-02-09 NOTE — Telephone Encounter (Signed)
Confirmed  Screened

## 2020-02-12 NOTE — Progress Notes (Signed)
Reviewed. Discuss with patient at visit 02/13/2020

## 2020-02-13 ENCOUNTER — Ambulatory Visit (INDEPENDENT_AMBULATORY_CARE_PROVIDER_SITE_OTHER): Payer: Medicare HMO | Admitting: Nurse Practitioner

## 2020-02-13 ENCOUNTER — Other Ambulatory Visit: Payer: Self-pay

## 2020-02-13 VITALS — BP 125/58 | HR 63 | Temp 97.5°F | Resp 16 | Ht 63.0 in | Wt 194.0 lb

## 2020-02-13 DIAGNOSIS — Z0001 Encounter for general adult medical examination with abnormal findings: Secondary | ICD-10-CM

## 2020-02-13 DIAGNOSIS — R300 Dysuria: Secondary | ICD-10-CM

## 2020-02-13 DIAGNOSIS — Z9889 Other specified postprocedural states: Secondary | ICD-10-CM | POA: Diagnosis not present

## 2020-02-13 DIAGNOSIS — M1A9XX0 Chronic gout, unspecified, without tophus (tophi): Secondary | ICD-10-CM

## 2020-02-13 DIAGNOSIS — M064 Inflammatory polyarthropathy: Secondary | ICD-10-CM

## 2020-02-13 DIAGNOSIS — F321 Major depressive disorder, single episode, moderate: Secondary | ICD-10-CM

## 2020-02-13 DIAGNOSIS — I10 Essential (primary) hypertension: Secondary | ICD-10-CM

## 2020-02-13 DIAGNOSIS — K760 Fatty (change of) liver, not elsewhere classified: Secondary | ICD-10-CM | POA: Diagnosis not present

## 2020-02-13 MED ORDER — DULOXETINE HCL 20 MG PO CPEP: 20.0000 mg | ORAL_CAPSULE | Freq: Every day | ORAL | 2 refills | Status: DC

## 2020-02-13 MED ORDER — OXYCODONE HCL 5 MG PO TABS: 5.0000 mg | ORAL_TABLET | Freq: Three times a day (TID) | ORAL | 0 refills | Status: DC | PRN

## 2020-02-13 NOTE — Progress Notes (Signed)
[facility] [place], [place] [address]  Internal MEDICINE  Office Visit Note  Patient Name: Martha Peterson  [id number]  [id number]  Date of Service: 02/25/2020    Pt is here for routine health maintenance examination  Chief Complaint  Patient presents with  . Medicare Wellness    left side of neck needs to be checked  . Hyperlipidemia  . Hypertension     The patient presents to the office for health maintenance exam. Increased her pain medication to take up to three times daily as needed for severe pain. She has been having increased pain which is not controlled by tramadol at all.  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 She can take ibuprofen from time to time, but ability to do this is very limited as she takes plavix and baby aspirin daily. She states that this increased dosing has been helpful. She generally does not take more that two a day, but is thanful for the third dose when it's needed. The patient had routine, fasting labs done since her most recent visit. Her renal functions have nearly returned to normal and her liver functions were normal. Her blood count and her lipid panel are both normal. She states that she has been feeling a bit depressed. She is quick to anger and cries more frequently than she used to. She denies feeling suicidal. She does not feel like she will hurt herself or anyone else.    Current Medication: Outpatient Encounter Medications as of 02/13/2020  Medication Sig Note  . allopurinol (ZYLOPRIM) 100 MG tablet Take 2 tablets (200 mg total) by mouth daily.   Marland Kitchen ascorbic acid (VITAMIN C) 500 MG tablet Take 500 mg by mouth daily.   Marland Kitchen aspirin EC 81 MG tablet Take 81 mg by mouth daily.   Marland Kitchen atorvastatin (LIPITOR) 10 MG tablet Take 1 tablet (10 mg total) by mouth daily at 6 PM.   . carvedilol (COREG) 25 MG tablet TAKE 1/2 TABLET TWICE DAILY WITH MEALS   .  cholecalciferol (VITAMIN D) 400 UNITS TABS tablet Take 1,000 Units by mouth.   . cloNIDine (CATAPRES) 0.1 MG tablet Take 0.5 tablets (0.05 mg total) by mouth 2 (two) times daily.   . clopidogrel (PLAVIX) 75 MG tablet Take 1 tablet (75 mg total) by mouth daily.   . diclofenac sodium (VOLTAREN) 1 % GEL Apply 3 grams to 3 large joints, up to 3 times daily as needed.   . furosemide (LASIX) 40 MG tablet Take one tablet by mouth two times daily   . Ginger, Zingiber officinalis, (GINGER PO) Take by mouth.   Marland Kitchen ibuprofen (ADVIL) 600 MG tablet Take 1 tablet (600 mg total) by mouth every 8 (eight) hours as needed.   Marland Kitchen ketorolac (TORADOL) 30 MG/ML injection ketorolac 30 mg/mL injection syringe  Inject 1 mL by intramuscular route.   Marland Kitchen levocetirizine (XYZAL) 5 MG tablet TAKE 1 TABLET EVERY DAY  FOR  ALLERGIES   . levothyroxine (SYNTHROID) 137 MCG tablet Take 1 tablet (137 mcg total) by mouth daily before breakfast.   . losartan-hydrochlorothiazide (HYZAAR) 100-25 MG tablet Take 1 tablet by mouth daily.   . magnesium oxide (MAG-OX) 400 MG tablet Take 400 mg by mouth daily.  08/20/2015: Received from: External Pharmacy  . meclizine (ANTIVERT) 12.5 MG tablet Take 12.5 mg by mouth as needed.    . Misc Natural Products (TART CHERRY  ADVANCED PO) Take by mouth daily.   . Multiple Vitamins-Minerals (PRESERVISION AREDS 2 PO) Take by mouth 2 (two) times daily.   Marland Kitchen omega-3 acid ethyl esters (LOVAZA) 1 G capsule Take 1 g by mouth 2 (two) times daily.   Marland Kitchen omeprazole (PRILOSEC) 20 MG capsule Take one capsule by mouth two times daily   . oxyCODONE (OXY IR/ROXICODONE) 5 MG immediate release tablet Take 1 tablet (5 mg total) by mouth 3 (three) times daily as needed for severe pain.   . potassium chloride (KLOR-CON) 10 MEQ tablet Take 1 tablet (10 mEq total) by mouth daily.   . potassium chloride (KLOR-CON) 10 MEQ tablet Take 10 mEq by mouth daily.   . predniSONE (DELTASONE) 5 MG tablet Take 2 tablets (10 mg total) by mouth  daily with breakfast.   . tiZANidine (ZANAFLEX) 4 MG tablet Take 1 tablet (4 mg total) by mouth 2 (two) times daily.   . TURMERIC PO Take by mouth daily.   . vitamin B-12 (CYANOCOBALAMIN) 100 MCG tablet    . vitamin E 400 UNIT capsule Take 400 Units by mouth 2 (two) times daily.  08/20/2015: Received from: External Pharmacy  . [DISCONTINUED] oxyCODONE (OXY IR/ROXICODONE) 5 MG immediate release tablet Take 1 tablet (5 mg total) by mouth 3 (three) times daily as needed for severe pain.   . [DISCONTINUED] oxyCODONE (OXY IR/ROXICODONE) 5 MG immediate release tablet Take 1 tablet (5 mg total) by mouth 3 (three) times daily as needed for severe pain.   . DULoxetine (CYMBALTA) 20 MG capsule Take 1 capsule (20 mg total) by mouth daily.    No facility-administered encounter medications on file as of 02/13/2020.    Surgical History: Past Surgical History:  Procedure Laterality Date  . ABDOMINAL HYSTERECTOMY    . ANKLE SURGERY Left   . APPENDECTOMY    . CAROTID PTA/STENT INTERVENTION N/A 07/17/2019   Procedure: CAROTID PTA/STENT INTERVENTION;  Surgeon: Algernon Huxley, MD;  Location: [facility];  Service: Cardiovascular;  Laterality: N/A;  . CAROTID-SUBCLAVIAN BYPASS GRAFT N/A 07/26/2019   Procedure: BYPASS GRAFT CAROTID-SUBCLAVIAN ( CAROTID TO CAROTID BYPASS);  Surgeon: Algernon Huxley, MD;  Location: [facility] ORS;  Service: Vascular;  Laterality: N/A;  . CHOLECYSTECTOMY    . COLONOSCOPY WITH PROPOFOL N/A 12/04/2014   Procedure: COLONOSCOPY WITH PROPOFOL;  Surgeon: Lollie Sails, MD;  Location: [facility] ENDOSCOPY;  Service: Endoscopy;  Laterality: N/A;  . FRACTURE SURGERY     Femur Fx; LT Ankle Pinning  . JOINT REPLACEMENT     RT TKR  . Patial Thyroid Resection      Medical History: Past Medical History:  Diagnosis Date  . Arthritis   . Asthma   . COPD (chronic obstructive pulmonary disease) ([place])   . Diverticulitis   . Gout   . Hyperlipidemia   . Hypertension   . Hypothyroidism   . 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 Microhematuria   . Mild mitral regurgitation   . Mild tricuspid regurgitation   . Osteopenia   . Pulmonary fibrosis ([place])   . Urinary incontinence     Family History: Family History  Problem Relation Age of Onset  . Heart Problems Mother   . Heart Problems Father   . Diabetes Father   . Lupus Sister   . Thyroid disease Sister   . Heart Problems Sister   . Hypertension Son   . Diabetes Son        borderline   .  Arthritis Daughter        in the left knee   . Diabetes Daughter   . Neuropathy Daughter   . Depression Daughter   . Anxiety disorder Daughter       Review of Systems  Constitutional: Positive for fatigue. Negative for activity change, chills and unexpected weight change.       Fatigue continues to improve.   HENT: Negative for congestion, postnasal drip, rhinorrhea, sneezing, sore throat, trouble swallowing and voice change.   Respiratory: Positive for shortness of breath. Negative for cough, chest tightness and wheezing.        With exertion.   Cardiovascular: Negative for chest pain and palpitations.       Blood pressure is stable.   Gastrointestinal: Negative for abdominal pain, constipation, diarrhea, nausea and vomiting.  Endocrine: Negative for cold intolerance, heat intolerance, polydipsia and polyuria.  Genitourinary: Negative for dysuria, frequency and urgency.  Musculoskeletal: Positive for myalgias and neck pain. Negative for arthralgias, back pain and joint swelling.        Improved neck tenderness after surgery. Continues to have severe lower back pain. Has done well with increased dosing of oxycodone. This has improved her low back pain some.   Skin: Negative for rash.  Allergic/Immunologic: Negative for environmental allergies.  Neurological: Positive for headaches. Negative for dizziness, tremors and numbness.       Gets mild headache from time to time.   Hematological: Negative for  adenopathy. Does not bruise/bleed easily.  Psychiatric/Behavioral: Positive for dysphoric mood. Negative for behavioral problems (Depression), sleep disturbance and suicidal ideas. The patient is nervous/anxious.        She has increased depression and feelings of sadness.     Today's Vitals   02/13/20 1400  BP: (!) 125/58  Pulse: 63  Resp: 16  Temp: (!) 97.5 F (36.4 C)  SpO2: 96%  Weight: 194 lb (88 kg)  Height: 5\' 3"  (1.6 m)   Body mass index is 34.37 kg/m.  Physical Exam Vitals and nursing note reviewed.  Constitutional:      General: She is not in acute distress.    Appearance: Normal appearance. She is well-developed. She is not diaphoretic.  HENT:     Head: Normocephalic and atraumatic.     Nose: Nose normal.     Mouth/Throat:     Pharynx: No oropharyngeal exudate.  Eyes:     Pupils: Pupils are equal, round, and reactive to light.  Neck:     Thyroid: No thyromegaly.     Vascular: No JVD.     Trachea: No tracheal deviation.     Comments: Well healing surgical incision from carotid to carotid bypass Cardiovascular:     Rate and Rhythm: Normal rate and regular rhythm.     Pulses: Normal pulses.     Heart sounds: Normal heart sounds. No murmur heard.  No friction rub. No gallop.   Pulmonary:     Effort: Pulmonary effort is normal. No respiratory distress.     Breath sounds: Normal breath sounds. No wheezing or rales.  Chest:     Chest wall: No tenderness.  Abdominal:     General: Bowel sounds are normal.     Palpations: Abdomen is soft.     Tenderness: There is no abdominal tenderness.  Musculoskeletal:        General: Normal range of motion.     Cervical back: Normal range of motion and neck supple.     Comments: Having severe low back  pain, worse with bending and twisting at the waist. Worse with reaching, pushing, pulling, and lifting with the arms.  Increased pain medication dosing has improved overall pain management.    Lymphadenopathy:     Cervical:  No cervical adenopathy.  Skin:    General: Skin is warm and dry.  Neurological:     General: No focal deficit present.     Mental Status: She is alert and oriented to person, place, and time.     Cranial Nerves: No cranial nerve deficit.  Psychiatric:        Mood and Affect: Mood normal.        Behavior: Behavior normal.        Thought Content: Thought content normal.        Judgment: Judgment normal.    Depression screen [facility] 2/9 02/13/2020 12/26/2019 08/31/2019 06/28/2019 04/21/2019  Decreased Interest 0 0 0 0 0  Down, Depressed, Hopeless 0 0 0 0 0  PHQ - 2 Score 0 0 0 0 0    Functional Status Survey: Is the patient deaf or have difficulty hearing?: No Does the patient have difficulty seeing, even when wearing glasses/contacts?: No Does the patient have difficulty concentrating, remembering, or making decisions?: No Does the patient have difficulty walking or climbing stairs?: Yes Does the patient have difficulty dressing or bathing?: No Does the patient have difficulty doing errands alone such as visiting a doctor's office or shopping?: No  MMSE - [place] Exam 02/13/2020 02/09/2019 01/24/2018  Orientation to time _0 Orientation to Place _1 Registration _2 Attention/ Calculation _3 Recall _4 Language- name 2 objects _5 Language- repeat _6 Language- follow 3 step command _7 Language- read & follow direction _8 Write a sentence _9 Copy design _10 Total score _11 Fall Risk  12/26/2019 08/31/2019 06/28/2019 04/21/2019 02/09/2019  Falls in the past year? 0 0 0 0 0     LABS: Recent Results (from the past 2160 hour(s))  Comprehensive metabolic panel     Status: Abnormal   Collection Time: 02/06/20 12:21 PM  Result Value Ref Range   Glucose 107 (H) 65 - 99 mg/dL   BUN 33 (H) 8 - 27 mg/dL   Creatinine, Ser 1.01 (H) 0.57 - 1.00 mg/dL   GFR calc non Af Amer 52 (L) >59 mL/min/1.73   GFR calc Af Amer 60 >59 mL/min/1.73     Comment: **[facility] currently reports eGFR in compliance with the current**   recommendations of the [employer]. [facility] will   update reporting as new guidelines are published from the NKF-ASN   Task force.    BUN/Creatinine Ratio 33 (H) 12 - 28   Sodium 138 134 - 144 mmol/L   Potassium 3.7 3.5 - 5.2 mmol/L   Chloride 93 (L) 96 - 106 mmol/L   CO2 27 20 - 29 mmol/L   Calcium 10.1 8.7 - 10.3 mg/dL   Total Protein 7.6 6.0 - 8.5 g/dL   Albumin 4.3 3.6 - 4.6 g/dL   Globulin, Total 3.3 1.5 - 4.5 g/dL   Albumin/Globulin Ratio 1.3 1.2 - 2.2   Bilirubin Total 0.9 0.0 - 1.2 mg/dL   Alkaline Phosphatase 146 (H) 48 - 121 IU/L   AST 37 0 - 40 IU/L   ALT 22 0 -  32 IU/L  CBC     Status: None   Collection Time: 02/06/20 12:21 PM  Result Value Ref Range   WBC 3.7 3.4 - 10.8 x10E3/uL   RBC 3.80 3.77 - 5.28 x10E6/uL   Hemoglobin 12.5 11.1 - 15.9 g/dL   Hematocrit 35.9 34.0 - 46.6 %   MCV 95 79 - 97 fL   MCH 32.9 26.6 - 33.0 pg   MCHC 34.8 31 - 35 g/dL   RDW 13.1 11.7 - 15.4 %   Platelets 158 150 - 450 x10E3/uL  Lipid Panel With LDL/HDL Ratio     Status: None   Collection Time: 02/06/20 12:21 PM  Result Value Ref Range   Cholesterol, Total 147 100 - 199 mg/dL   Triglycerides 140 0 - 149 mg/dL   HDL 46 >39 mg/dL   VLDL Cholesterol Cal 24 5 - 40 mg/dL   LDL Chol Calc (NIH) 77 0 - 99 mg/dL   LDL/HDL Ratio 1.7 0.0 - 3.2 ratio    Comment:                                     LDL/HDL Ratio                                             Men  Women                               1/2 Avg.Risk  1.0    1.5                                   Avg.Risk  3.6    3.2                                2X Avg.Risk  6.2    5.0                                3X Avg.Risk  8.0    6.1   Iron and TIBC     Status: None   Collection Time: 02/06/20 12:21 PM  Result Value Ref Range   Total Iron Binding Capacity 343 250 - 450 ug/dL   UIBC 263 118 - 369 ug/dL   Iron 80 27 - 139 ug/dL   Iron Saturation 23 15  - 55 %  B12 and Folate Panel     Status: None   Collection Time: 02/06/20 12:21 PM  Result Value Ref Range   Vitamin B-12 885 232 - 1,245 pg/mL   Folate 18.5 >3.0 ng/mL    Comment: A serum folate concentration of less than 3.1 ng/mL is considered to represent clinical deficiency.   T4, free     Status: Abnormal   Collection Time: 02/06/20 12:21 PM  Result Value Ref Range   Free T4 1.88 (H) 0.82 - 1.77 ng/dL  TSH     Status: None   Collection Time: 02/06/20 12:21 PM  Result Value Ref Range   TSH 1.150 0.450 - 4.500 uIU/mL  VITAMIN D 25 Hydroxy (Vit-D  Deficiency, Fractures)     Status: None   Collection Time: 02/06/20 12:21 PM  Result Value Ref Range   Vit D, 25-Hydroxy 41.0 30.0 - 100.0 ng/mL    Comment: Vitamin D deficiency has been defined by the [place] practice guideline as a level of serum 25-OH vitamin D less than 20 ng/mL (1,2). The Endocrine Society went on to further define vitamin D insufficiency as a level between 21 and 29 ng/mL (2). 1. IOM (Institute of Medicine). 2010. Dietary reference    intakes for calcium and D. [place]: The    [employer]. 2. Holick MF, Binkley [place], Bischoff-Ferrari HA, et al.    Evaluation, treatment, and prevention of vitamin D    deficiency: an Endocrine Society clinical practice    guideline. JCEM. 2011 Jul; 96(7):1911-30.   Ferritin     Status: None   Collection Time: 02/06/20 12:21 PM  Result Value Ref Range   Ferritin 145 15.0 - 150.0 ng/mL  UA/M w/rflx Culture, Routine     Status: Abnormal   Collection Time: 02/13/20  2:33 PM   Specimen: Urine   Urine  Result Value Ref Range   Specific Gravity, UA 1.014 1.005 - 1.030   pH, UA 6.5 5.0 - 7.5   Color, UA Yellow Yellow   Appearance Ur Clear Clear   Leukocytes,UA Trace (A) Negative   Protein,UA 1+ (A) Negative/Trace   Glucose, UA Negative Negative   Ketones, UA Negative Negative   RBC, UA Negative Negative   Bilirubin, UA  Negative Negative   Urobilinogen, Ur 0.2 0.2 - 1.0 mg/dL   Nitrite, UA Negative Negative   Microscopic Examination See below:     Comment: Microscopic was indicated and was performed.   Urinalysis Reflex Comment     Comment: This specimen has reflexed to a Urine Culture.  Microscopic Examination     Status: None   Collection Time: 02/13/20  2:33 PM   Urine  Result Value Ref Range   WBC, UA 0-5 0 - 5 /hpf   RBC None seen 0 - 2 /hpf   Epithelial Cells (non renal) 0-10 0 - 10 /hpf   Casts None seen None seen /lpf   Bacteria, UA None seen None seen/Few  Urine Culture, Reflex     Status: None   Collection Time: 02/13/20  2:33 PM   Urine  Result Value Ref Range   Urine Culture, Routine Final report    Organism ID, Bacteria Comment     Comment: Greater than 2 organisms recovered, none predominant. Please submit another sample if clinically indicated. 50,000-100,000 colony forming units per mL     Assessment/Plan: 1. Encounter for general adult medical examination with abnormal findings Annual health maintenance exam  2. Essential hypertension Blood pressure stable. Continue BP medication as prescribed   3. S/P carotid endarterectomy Patient doing well post surgery. She should continue to follow up with vein and vascular as scheduled   4. Inflammatory polyarthritis ([place]) May take oxycodone 107m up to three times daily if needed for severe pain. Three 30 day prescriptions provided today. Dates are 02/13/2020, 03/13/2020, and 04/10/2020. - oxyCODONE (OXY IR/ROXICODONE) 5 MG immediate release tablet; Take 1 tablet (5 mg total) by mouth 3 (three) times daily as needed for severe pain.  Dispense: 90 tablet; Refill: 0  5. Nonalcoholic fatty liver disease Normal liver functions with recent lab check. She should continue follow up visits with GI as scheduled.   6.  Chronic gout without tophus, unspecified cause, unspecified site Continue allopurinol daily as prescribed.   7. Current  moderate episode of major depressive disorder, unspecified whether recurrent (HCC) Start duloxetine 31m daily. Reassess at next visit.  - DULoxetine (CYMBALTA) 20 MG capsule; Take 1 capsule (20 mg total) by mouth daily.  Dispense: 30 capsule; Refill: 2  8. Dysuria - UA/M w/rflx Culture, Routine  General Counseling: Martha Peterson verbalizes understanding of the findings of todays visit and agrees with plan of treatment. I have discussed any further diagnostic evaluation that may be needed or ordered today. We also reviewed her medications today. she has been encouraged to call the office with any questions or concerns that should arise related to todays visit.    Counseling:  This patient was seen by HLeretha PolFNP Collaboration with Dr FLavera Guiseas a part of collaborative care agreement  Orders Placed This Encounter  Procedures  . Microscopic Examination  . Urine Culture, Reflex  . UA/M w/rflx Culture, Routine    Meds ordered this encounter  Medications  . DULoxetine (CYMBALTA) 20 MG capsule    Sig: Take 1 capsule (20 mg total) by mouth daily.    Dispense:  30 capsule    Refill:  2    Order Specific Question:   Supervising Provider    Answer:   KLavera Guise[[[id number]] . DISCONTD: oxyCODONE (OXY IR/ROXICODONE) 5 MG immediate release tablet    Sig: Take 1 tablet (5 mg total) by mouth 3 (three) times daily as needed for severe pain.    Dispense:  90 tablet    Refill:  0    Fill after 02/22/2020    Order Specific Question:   Supervising Provider    Answer:   KLavera Guise[[[id number]] . oxyCODONE (OXY IR/ROXICODONE) 5 MG immediate release tablet    Sig: Take 1 tablet (5 mg total) by mouth 3 (three) times daily as needed for severe pain.    Dispense:  90 tablet    Refill:  0    Fill after 03/21/2020    Order Specific Question:   Supervising Provider    Answer:   KLavera Guise[[[id number]]   Total time spent: 526Minutes  Time spent includes review of chart, medications, test results, and  follow up plan with the patient.     FLavera Guise MD  Internal Medicine

## 2020-02-16 LAB — UA/M W/RFLX CULTURE, ROUTINE
Bilirubin, UA: NEGATIVE
Glucose, UA: NEGATIVE
Ketones, UA: NEGATIVE
Nitrite, UA: NEGATIVE
RBC, UA: NEGATIVE
Specific Gravity, UA: 1.014 (ref 1.005–1.030)
Urobilinogen, Ur: 0.2 mg/dL (ref 0.2–1.0)
pH, UA: 6.5 (ref 5.0–7.5)

## 2020-02-16 LAB — URINE CULTURE, REFLEX

## 2020-02-16 LAB — MICROSCOPIC EXAMINATION
Bacteria, UA: NONE SEEN
Casts: NONE SEEN /lpf
RBC, Urine: NONE SEEN /hpf (ref 0–2)

## 2020-02-25 DIAGNOSIS — M1A9XX0 Chronic gout, unspecified, without tophus (tophi): Secondary | ICD-10-CM | POA: Insufficient documentation

## 2020-02-25 DIAGNOSIS — F321 Major depressive disorder, single episode, moderate: Secondary | ICD-10-CM | POA: Insufficient documentation

## 2020-02-25 DIAGNOSIS — Z0001 Encounter for general adult medical examination with abnormal findings: Secondary | ICD-10-CM | POA: Insufficient documentation

## 2020-02-27 ENCOUNTER — Other Ambulatory Visit: Payer: Self-pay

## 2020-02-27 DIAGNOSIS — J0100 Acute maxillary sinusitis, unspecified: Secondary | ICD-10-CM

## 2020-02-27 MED ORDER — PREDNISONE 5 MG PO TABS: 10.0000 mg | ORAL_TABLET | Freq: Every day | ORAL | 1 refills | Status: DC

## 2020-03-01 DIAGNOSIS — H353131 Nonexudative age-related macular degeneration, bilateral, early dry stage: Secondary | ICD-10-CM | POA: Diagnosis not present

## 2020-03-07 ENCOUNTER — Other Ambulatory Visit: Payer: Self-pay

## 2020-03-07 MED ORDER — CARVEDILOL 25 MG PO TABS: ORAL_TABLET | ORAL | 1 refills | Status: DC

## 2020-03-11 ENCOUNTER — Other Ambulatory Visit: Payer: Self-pay

## 2020-03-11 DIAGNOSIS — M153 Secondary multiple arthritis: Secondary | ICD-10-CM

## 2020-03-11 MED ORDER — TIZANIDINE HCL 4 MG PO TABS: 4.0000 mg | ORAL_TABLET | Freq: Two times a day (BID) | ORAL | 1 refills | Status: DC

## 2020-03-26 ENCOUNTER — Ambulatory Visit: Payer: Medicare HMO | Admitting: Nurse Practitioner

## 2020-03-29 ENCOUNTER — Telehealth: Payer: Self-pay

## 2020-03-29 NOTE — Telephone Encounter (Signed)
Lmom  pt that take her med prescribed and heather review her chart send some med

## 2020-04-01 NOTE — Telephone Encounter (Signed)
Spoke with pt she is feeling better and duloxetine is helping she is going to make appt with heather in few days

## 2020-04-10 ENCOUNTER — Other Ambulatory Visit: Payer: Self-pay

## 2020-04-10 DIAGNOSIS — Z8739 Personal history of other diseases of the musculoskeletal system and connective tissue: Secondary | ICD-10-CM

## 2020-04-10 DIAGNOSIS — E039 Hypothyroidism, unspecified: Secondary | ICD-10-CM

## 2020-04-10 DIAGNOSIS — E876 Hypokalemia: Secondary | ICD-10-CM

## 2020-04-10 MED ORDER — LEVOTHYROXINE SODIUM 137 MCG PO TABS: 137.0000 ug | ORAL_TABLET | Freq: Every day | ORAL | 1 refills | Status: DC

## 2020-04-10 MED ORDER — ATORVASTATIN CALCIUM 10 MG PO TABS
10.0000 mg | ORAL_TABLET | Freq: Every day | ORAL | 1 refills | Status: DC
Start: 2020-04-10 — End: 2020-09-02

## 2020-04-10 MED ORDER — ALLOPURINOL 100 MG PO TABS: 200.0000 mg | ORAL_TABLET | Freq: Every day | ORAL | 1 refills | Status: DC

## 2020-04-10 MED ORDER — POTASSIUM CHLORIDE ER 10 MEQ PO TBCR: 10.0000 meq | EXTENDED_RELEASE_TABLET | Freq: Every day | ORAL | 1 refills | Status: DC

## 2020-04-10 MED ORDER — LEVOCETIRIZINE DIHYDROCHLORIDE 5 MG PO TABS
ORAL_TABLET | ORAL | 1 refills | Status: DC
Start: 2020-04-10 — End: 2020-04-15

## 2020-04-11 ENCOUNTER — Other Ambulatory Visit: Payer: Self-pay | Admitting: Nurse Practitioner

## 2020-04-11 DIAGNOSIS — E039 Hypothyroidism, unspecified: Secondary | ICD-10-CM | POA: Diagnosis not present

## 2020-04-11 DIAGNOSIS — M1A9XX0 Chronic gout, unspecified, without tophus (tophi): Secondary | ICD-10-CM | POA: Diagnosis not present

## 2020-04-11 DIAGNOSIS — R945 Abnormal results of liver function studies: Secondary | ICD-10-CM | POA: Diagnosis not present

## 2020-04-12 LAB — T4, FREE: Free T4: 1.89 ng/dL — ABNORMAL HIGH (ref 0.82–1.77)

## 2020-04-12 LAB — BASIC METABOLIC PANEL
BUN/Creatinine Ratio: 26 (ref 12–28)
BUN: 28 mg/dL — ABNORMAL HIGH (ref 8–27)
CO2: 27 mmol/L (ref 20–29)
Calcium: 9.9 mg/dL (ref 8.7–10.3)
Chloride: 97 mmol/L (ref 96–106)
Creatinine, Ser: 1.07 mg/dL — ABNORMAL HIGH (ref 0.57–1.00)
GFR calc Af Amer: 56 mL/min/{1.73_m2} — ABNORMAL LOW (ref >59)
GFR calc non Af Amer: 49 mL/min/{1.73_m2} — ABNORMAL LOW (ref >59)
Glucose: 129 mg/dL — ABNORMAL HIGH (ref 65–99)
Potassium: 4.4 mmol/L (ref 3.5–5.2)
Sodium: 139 mmol/L (ref 134–144)

## 2020-04-12 LAB — TSH: TSH: 1.65 u[IU]/mL (ref 0.450–4.500)

## 2020-04-12 LAB — URIC ACID: Uric Acid: 5.1 mg/dL (ref 3.1–7.9)

## 2020-04-15 ENCOUNTER — Encounter: Payer: Self-pay | Admitting: Cardiology

## 2020-04-15 ENCOUNTER — Other Ambulatory Visit: Payer: Self-pay

## 2020-04-15 ENCOUNTER — Ambulatory Visit: Payer: Medicare HMO | Admitting: Cardiology

## 2020-04-15 VITALS — BP 80/54 | HR 110 | Ht 63.0 in | Wt 196.4 lb

## 2020-04-15 DIAGNOSIS — I10 Essential (primary) hypertension: Secondary | ICD-10-CM | POA: Diagnosis not present

## 2020-04-15 DIAGNOSIS — I5189 Other ill-defined heart diseases: Secondary | ICD-10-CM | POA: Diagnosis not present

## 2020-04-15 DIAGNOSIS — I4891 Unspecified atrial fibrillation: Secondary | ICD-10-CM | POA: Diagnosis not present

## 2020-04-15 DIAGNOSIS — I6523 Occlusion and stenosis of bilateral carotid arteries: Secondary | ICD-10-CM

## 2020-04-15 MED ORDER — APIXABAN 5 MG PO TABS: 5.0000 mg | ORAL_TABLET | Freq: Two times a day (BID) | ORAL | 5 refills | Status: DC

## 2020-04-15 MED ORDER — APIXABAN 5 MG PO TABS
5.0000 mg | ORAL_TABLET | Freq: Two times a day (BID) | ORAL | 5 refills | Status: DC
Start: 2020-04-15 — End: 2021-01-16

## 2020-04-15 NOTE — Progress Notes (Addendum)
Cardiology Office Note:    Date:  04/15/2020   ID:  LAVERE SHINSKY, DOB [birth year], MRN [id number]  PCP:  [name], MD  Cardiologist:  No primary care provider on file.  Electrophysiologist:  None   Referring MD: [name], MD   Chief Complaint  Patient presents with  . OTHER    6 month fu no complaints today. Meds reviewed verbally with pt.    History of Present Illness:    Martha Peterson is a 79 y.o. female with a hx of hypertension, hyperlipidemia, carotid stenosis, innominate artery stenosis who presents for follow-up.  Previously seen due to abnormal outside echocardiogram.  Repeat echocardiogram showed normal systolic function, grade 2 diastolic dysfunction.  Patient is euvolemic.  Follows up with vascular surgery for PAD.  Feels well today, has no chest pain or shortness of breath, palpitations.  Blood pressures appear low today.  Historical notes  Patient has a history of mitral regurgitation being followed periodically with echocardiogram.  Last outside echocardiogram obtained on 05/26/2019, showed mildly reduced EF of 45%, impaired relaxation/grade 1 diastolic dysfunction, moderate concentric LVH, trivial MR, TR, aortic sclerosis no stenosis.  She denies any symptoms of chest pain or shortness of breath at rest or with activity.  She takes Coreg, losartan, HCTZ.  She has a known history of carotid stenosis with carotid bruit.CT angio of the neck showed high-grade calcific stenosis innominate artery, flow-limiting stenosis in the right carotid system.  There is sclerosis with calcification in the carotid bifurcation bilaterally without significant stenosis.  She is seeing vascular surgery for carotid artery disease.  She takes a baby aspirin and Lipitor 10 mg.  Has a history of liver abnormality hence the low-dose statin.     Past Medical History:  Diagnosis Date  . Arthritis   . Asthma   . COPD (chronic obstructive pulmonary disease) ([place])   .  Diverticulitis   . Gout   . Hyperlipidemia   . Hypertension   . Hypothyroidism   . 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 Microhematuria   . Mild mitral regurgitation   . Mild tricuspid regurgitation   . Osteopenia   . Pulmonary fibrosis ([place])   . Urinary incontinence     Past Surgical History:  Procedure Laterality Date  . ABDOMINAL HYSTERECTOMY    . ANKLE SURGERY Left   . APPENDECTOMY    . CAROTID PTA/STENT INTERVENTION N/A 07/17/2019   Procedure: CAROTID PTA/STENT INTERVENTION;  Surgeon: Algernon Huxley, MD;  Location: [place] [facility];  Service: Cardiovascular;  Laterality: N/A;  . CAROTID-SUBCLAVIAN BYPASS GRAFT N/A 07/26/2019   Procedure: BYPASS GRAFT CAROTID-SUBCLAVIAN ( CAROTID TO CAROTID BYPASS);  Surgeon: Algernon Huxley, MD;  Location: [facility];  Service: Vascular;  Laterality: N/A;  . CHOLECYSTECTOMY    . COLONOSCOPY WITH PROPOFOL N/A 12/04/2014   Procedure: COLONOSCOPY WITH PROPOFOL;  Surgeon: Lollie Sails, MD;  Location: [facility];  Service: Endoscopy;  Laterality: N/A;  . FRACTURE SURGERY     Femur Fx; LT Ankle Pinning  . JOINT REPLACEMENT     RT TKR  . Patial Thyroid Resection      Current Medications: Current Meds  Medication Sig  . allopurinol (ZYLOPRIM) 100 MG tablet Take 2 tablets (200 mg total) by mouth daily.  Marland Kitchen ascorbic acid (VITAMIN C) 500 MG tablet Take 500 mg by mouth daily.  Marland Kitchen aspirin EC 81 MG tablet Take 81 mg by mouth daily.  Marland Kitchen atorvastatin (LIPITOR) 10  MG tablet Take 1 tablet (10 mg total) by mouth daily at 6 PM.  . carvedilol (COREG) 25 MG tablet TAKE 1/2 TABLET TWICE DAILY WITH MEALS  . cholecalciferol (VITAMIN D) 400 UNITS TABS tablet Take 1,000 Units by mouth.  . diclofenac sodium (VOLTAREN) 1 % GEL Apply 3 grams to 3 large joints, up to 3 times daily as needed.  . furosemide (LASIX) 40 MG tablet Take one tablet by mouth two times daily  . Ginger, Zingiber officinalis, (GINGER PO) Take by mouth.  Marland Kitchen ibuprofen  (ADVIL) 600 MG tablet Take 1 tablet (600 mg total) by mouth every 8 (eight) hours as needed.  Marland Kitchen levothyroxine (SYNTHROID) 137 MCG tablet Take 1 tablet (137 mcg total) by mouth daily before breakfast.  . losartan-hydrochlorothiazide (HYZAAR) 100-25 MG tablet Take 1 tablet by mouth daily.  . magnesium oxide (MAG-OX) 400 MG tablet Take 400 mg by mouth daily.   . Misc Natural Products (TART CHERRY ADVANCED PO) Take by mouth daily.  . Multiple Vitamins-Minerals (PRESERVISION AREDS 2 PO) Take by mouth 2 (two) times daily.  Marland Kitchen omega-3 acid ethyl esters (LOVAZA) 1 G capsule Take 1 g by mouth 2 (two) times daily.  Marland Kitchen omeprazole (PRILOSEC) 20 MG capsule Take one capsule by mouth two times daily  . oxyCODONE (OXY IR/ROXICODONE) 5 MG immediate release tablet Take 1 tablet (5 mg total) by mouth 3 (three) times daily as needed for severe pain.  . potassium chloride (KLOR-CON) 10 MEQ tablet Take 1 tablet (10 mEq total) by mouth daily.  . predniSONE (DELTASONE) 5 MG tablet Take 2 tablets (10 mg total) by mouth daily with breakfast.  . tiZANidine (ZANAFLEX) 4 MG tablet Take 1 tablet (4 mg total) by mouth 2 (two) times daily.  . TURMERIC PO Take by mouth daily.  . vitamin E 400 UNIT capsule Take 800 Units by mouth daily.   . [DISCONTINUED] cloNIDine (CATAPRES) 0.1 MG tablet Take 0.5 tablets (0.05 mg total) by mouth 2 (two) times daily.  . [DISCONTINUED] clopidogrel (PLAVIX) 75 MG tablet Take 1 tablet (75 mg total) by mouth daily.     Allergies:   Codeine, Levaquin [levofloxacin], and Tape   Social History   Socioeconomic History  . Marital status: Married    Spouse name: Jeneen Rinks  . Number of children: Not on file  . Years of education: Not on file  . Highest education level: Not on file  Occupational History  . Not on file  Tobacco Use  . Smoking status: Former Smoker    Years: 1.00    Types: Cigarettes  . Smokeless tobacco: Never Used  Vaping Use  . Vaping Use: Never used  Substance and Sexual  Activity  . Alcohol use: No  . Drug use: No  . Sexual activity: Not Currently  Other Topics Concern  . Not on file  Social History Narrative   Lives at home with husband in private residence   Social Determinants of Health   Financial Resource Strain:   . Difficulty of Paying Living Expenses: Not on file  Food Insecurity:   . Worried About Charity fundraiser in the Last Year: Not on file  . Ran Out of Food in the Last Year: Not on file  Transportation Needs:   . Lack of Transportation (Medical): Not on file  . Lack of Transportation (Non-Medical): Not on file  Physical Activity:   . Days of Exercise per Week: Not on file  . Minutes of Exercise per Session: Not on  file  Stress:   . Feeling of Stress : Not on file  Social Connections:   . Frequency of Communication with Friends and Family: Not on file  . Frequency of Social Gatherings with Friends and Family: Not on file  . Attends Religious Services: Not on file  . Active Member of Clubs or Organizations: Not on file  . Attends Archivist Meetings: Not on file  . Marital Status: Not on file     Family History: The patient's family history includes Anxiety disorder in her daughter; Arthritis in her daughter; Depression in her daughter; Diabetes in her daughter, father, and son; Heart Problems in her father, mother, and sister; Hypertension in her son; Lupus in her sister; Neuropathy in her daughter; Thyroid disease in her sister.  ROS:   Please see the history of present illness.     All other systems reviewed and are negative.  EKGs/Labs/Other Studies Reviewed:      EKG:  EKG is  ordered today.  EKG shows atrial fibrillation, heart rate 110  Recent Labs: 10/03/2019: Magnesium 1.6 02/06/2020: ALT 22; Hemoglobin 12.5; Platelets 158 04/11/2020: BUN 28; Creatinine, Ser 1.07; Potassium 4.4; Sodium 139; TSH 1.650  Recent Lipid Panel    Component Value Date/Time   CHOL 147 02/06/2020 1221   TRIG 140 02/06/2020  1221   HDL 46 02/06/2020 1221   LDLCALC 77 02/06/2020 1221    Physical Exam:    VS:  BP (!) 80/54 (BP Location: Left Arm, Patient Position: Sitting, Cuff Size: Large)   Pulse (!) 110   Ht 5\' 3"  (1.6 m)   Wt 196 lb 6 oz (89.1 kg)   SpO2 98%   BMI 34.79 kg/m     Wt Readings from Last 3 Encounters:  04/15/20 196 lb 6 oz (89.1 kg)  02/13/20 194 lb (88 kg)  12/26/19 191 lb 12.8 oz (87 kg)     GEN:  Well nourished, well developed in no acute distress HEENT: Normal NECK: No JVD; bilateral carotid bruits noted LYMPHATICS: No lymphadenopathy CARDIAC: irreglar irregular, 2/6 systolic murmur noted, RESPIRATORY:  Clear to auscultation without rales, wheezing or rhonchi  ABDOMEN: Soft, non-tender, non-distended MUSCULOSKELETAL:1+ edema; No deformity, left edema greater than right which is chronic due to left ankle screw surgery. SKIN: Warm and dry NEUROLOGIC:  Alert and oriented x 3 PSYCHIATRIC:  Normal affect   ASSESSMENT:    1. Atrial fibrillation, unspecified type ([place])   2. Grade II diastolic dysfunction   3. Essential hypertension   4. Bilateral carotid artery stenosis    PLAN:    In order of problems listed above:  1. Patient with new onset atrial fibrillation noted on EKG. CHA2DS2-VASc of 5 (age, htn, vasc, gender). Continue Coreg, start Eliquis 5 mg twice daily. Hopefully heart rate improves with titrating of clonidine. Last echo showed severely dilated left atrium. Chance of maintaining sinus with cardioversion is significantly reduced. 2.  echocardiogram on 10/05/2019 showed normal systolic function, EF 60 to 83%, grade 2 diastolic dysfunction, severely dilated left atrium, aortic sclerosis, mild MR. Continue Lasix. 3. History of hypertension, blood pressure low today. Titrate off clonidine. If BP stays low, will reduce losartan/HCTZ combo pill to half tab daily. 4. History of carotid artery disease, stop Plavix with starting Eliquis, continue aspirin, Lipitor.  Follows up  with vascular surgery.  Follow-up 1 month  This note was generated in part or whole with voice recognition software. Voice recognition is usually quite accurate but there are transcription errors  that can and very often do occur. I apologize for any typographical errors that were not detected and corrected.  Medication Adjustments/Labs and Tests Ordered: Current medicines are reviewed at length with the patient today.  Concerns regarding medicines are outlined above.  Orders Placed This Encounter  Procedures  . EKG 12-Lead   Meds ordered this encounter  Medications  . DISCONTD: apixaban (ELIQUIS) 5 MG TABS tablet    Sig: Take 1 tablet (5 mg total) by mouth 2 (two) times daily.    Dispense:  60 tablet    Refill:  5  . apixaban (ELIQUIS) 5 MG TABS tablet    Sig: Take 1 tablet (5 mg total) by mouth 2 (two) times daily.    Dispense:  60 tablet    Refill:  5    Patient Instructions  Medication Instructions:  Your physician has recommended you make the following change in your medication:   1.  STOP taking your Plavix.  2.  START taking apixaban (ELIQUIS) 5 MG TABS tablet:  Take 1 tablet (5 mg total) by mouth 2 (two) times daily.  2.  Taper your Clonidine: -Take 0.5 tab once a day, for 1 week. -Take 0.5 tab every other day, for one week. -Take 0.5 tab every 2 days for one week then stop.  *If you need a refill on your cardiac medications before your next appointment, please call your pharmacy*   Lab Work: None Ordered If you have labs (blood work) drawn today and your tests are completely normal, you will receive your results only by: Marland Kitchen MyChart Message (if you have MyChart) OR . A paper copy in the mail If you have any lab test that is abnormal or we need to change your treatment, we will call you to review the results.   Testing/Procedures: None Ordered     Follow-Up: At [facility], you and your health needs are our priority.  As part of our continuing mission to  provide you with exceptional heart care, we have created designated Provider Care Teams.  These Care Teams include your primary Cardiologist (physician) and Advanced Practice Providers (APPs -  Physician Assistants and Nurse Practitioners) who all work together to provide you with the care you need, when you need it.  We recommend signing up for the patient portal called "MyChart".  Sign up information is provided on this After Visit Summary.  MyChart is used to connect with patients for Virtual Visits (Telemedicine).  Patients are able to view lab/test results, encounter notes, upcoming appointments, etc.  Non-urgent messages can be sent to your provider as well.   To learn more about what you can do with MyChart, go to [email or web address].    Your next appointment:   1 month(s)  The format for your next appointment:   In Person  Provider:   Kate Sable, MD   Other Instructions     Signed, Kate Sable, MD  04/15/2020 5:03 PM    [place]

## 2020-04-15 NOTE — Patient Instructions (Signed)
Medication Instructions:  Your physician has recommended you make the following change in your medication:   1.  STOP taking your Plavix.  2.  START taking apixaban (ELIQUIS) 5 MG TABS tablet:  Take 1 tablet (5 mg total) by mouth 2 (two) times daily.  2.  Taper your Clonidine: -Take 0.5 tab once a day, for 1 week. -Take 0.5 tab every other day, for one week. -Take 0.5 tab every 2 days for one week then stop.  *If you need a refill on your cardiac medications before your next appointment, please call your pharmacy*   Lab Work: None Ordered If you have labs (blood work) drawn today and your tests are completely normal, you will receive your results only by: Marland Kitchen MyChart Message (if you have MyChart) OR . A paper copy in the mail If you have any lab test that is abnormal or we need to change your treatment, we will call you to review the results.   Testing/Procedures: None Ordered     Follow-Up: At [facility], you and your health needs are our priority.  As part of our continuing mission to provide you with exceptional heart care, we have created designated Provider Care Teams.  These Care Teams include your primary Cardiologist (physician) and Advanced Practice Providers (APPs -  Physician Assistants and Nurse Practitioners) who all work together to provide you with the care you need, when you need it.  We recommend signing up for the patient portal called "MyChart".  Sign up information is provided on this After Visit Summary.  MyChart is used to connect with patients for Virtual Visits (Telemedicine).  Patients are able to view lab/test results, encounter notes, upcoming appointments, etc.  Non-urgent messages can be sent to your provider as well.   To learn more about what you can do with MyChart, go to [email or web address].    Your next appointment:   1 month(s)  The format for your next appointment:   In Person  Provider:   Kate Sable, MD   Other  Instructions

## 2020-04-15 NOTE — Progress Notes (Signed)
Discuss with patient at visit 11/2

## 2020-04-16 ENCOUNTER — Encounter: Payer: Self-pay | Admitting: Nurse Practitioner

## 2020-04-16 ENCOUNTER — Ambulatory Visit (INDEPENDENT_AMBULATORY_CARE_PROVIDER_SITE_OTHER): Payer: Medicare HMO | Admitting: Nurse Practitioner

## 2020-04-16 VITALS — BP 116/65 | HR 87 | Temp 97.5°F | Resp 16 | Ht 63.0 in | Wt 194.8 lb

## 2020-04-16 DIAGNOSIS — K7581 Nonalcoholic steatohepatitis (NASH): Secondary | ICD-10-CM

## 2020-04-16 DIAGNOSIS — F321 Major depressive disorder, single episode, moderate: Secondary | ICD-10-CM | POA: Diagnosis not present

## 2020-04-16 DIAGNOSIS — M064 Inflammatory polyarthropathy: Secondary | ICD-10-CM | POA: Diagnosis not present

## 2020-04-16 DIAGNOSIS — N289 Disorder of kidney and ureter, unspecified: Secondary | ICD-10-CM

## 2020-04-16 DIAGNOSIS — Z23 Encounter for immunization: Secondary | ICD-10-CM

## 2020-04-16 DIAGNOSIS — I10 Essential (primary) hypertension: Secondary | ICD-10-CM

## 2020-04-16 DIAGNOSIS — M1612 Unilateral primary osteoarthritis, left hip: Secondary | ICD-10-CM | POA: Diagnosis not present

## 2020-04-16 DIAGNOSIS — R600 Localized edema: Secondary | ICD-10-CM | POA: Insufficient documentation

## 2020-04-16 MED ORDER — TORSEMIDE 20 MG PO TABS: 20.0000 mg | ORAL_TABLET | Freq: Every day | ORAL | 1 refills | Status: DC

## 2020-04-16 MED ORDER — IBUPROFEN 600 MG PO TABS: 600.0000 mg | ORAL_TABLET | Freq: Three times a day (TID) | ORAL | 1 refills | Status: DC | PRN

## 2020-04-16 MED ORDER — OXYCODONE HCL 5 MG PO TABS: 5.0000 mg | ORAL_TABLET | Freq: Three times a day (TID) | ORAL | 0 refills | Status: DC | PRN

## 2020-04-16 MED ORDER — MIRTAZAPINE 7.5 MG PO TABS: 7.5000 mg | ORAL_TABLET | Freq: Every day | ORAL | 1 refills | Status: DC

## 2020-04-16 NOTE — Progress Notes (Signed)
[facility] [place], [place] [address]  Internal MEDICINE  Office Visit Note  Patient Name: Martha Peterson  [id number]  [id number]  Date of Service: 05/08/2020  Chief Complaint  Patient presents with  . Follow-up  . Hyperlipidemia  . Hypertension  . Quality Metric Gaps    flu,tetnaus    The patient is here for routine follow up. Her husband recently passed  Away after a long battle with cancer. She was started on low dose duloxetine which she states caused some negative side effects. She states that she started having headaches and difficulty sleeping.  She saw her cardiologist yesterday. Was diagnosed with a-fib. Was started on eliquis twice daily. She is to continue her coreg and is in weaning off process of her clonidine. She states that she goes back to see cardiologist in one month.  She continues to have swelling in her lower extremities. This is worse on left side. Not getting as much relief from furosemide.  Had labs done prior to this visit. Renal functions are still elevated however stable. Consider this may be related to recent diagnosis of a-fib.  Increased her pain medication to take up to three times daily as needed for severe pain. She has been having increased pain which is not controlled by tramadol at all.  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 She can take ibuprofen from time to time, but ability to do this is very limited as she takes plavix and baby aspirin daily. She states that this increased dosing has been helpful. She generally does not take more that two a day, but is thanful for the third dose when it's needed.  She is due to have ultrasound of her abdomen for surveillance of [name].      Current Medication: Outpatient Encounter Medications as of 04/16/2020  Medication Sig Note  . allopurinol (ZYLOPRIM) 100 MG tablet Take 2 tablets (200 mg total) by mouth  daily.   Marland Kitchen apixaban (ELIQUIS) 5 MG TABS tablet Take 1 tablet (5 mg total) by mouth 2 (two) times daily.   Marland Kitchen ascorbic acid (VITAMIN C) 500 MG tablet Take 500 mg by mouth daily.   Marland Kitchen aspirin EC 81 MG tablet Take 81 mg by mouth daily.   Marland Kitchen atorvastatin (LIPITOR) 10 MG tablet Take 1 tablet (10 mg total) by mouth daily at 6 PM.   . carvedilol (COREG) 25 MG tablet TAKE 1/2 TABLET TWICE DAILY WITH MEALS   . cholecalciferol (VITAMIN D) 400 UNITS TABS tablet Take 1,000 Units by mouth.   . diclofenac sodium (VOLTAREN) 1 % GEL Apply 3 grams to 3 large joints, up to 3 times daily as needed.   . Ginger, Zingiber officinalis, (GINGER PO) Take by mouth.   Marland Kitchen ibuprofen (ADVIL) 600 MG tablet Take 1 tablet (600 mg total) by mouth every 8 (eight) hours as needed.   Marland Kitchen levothyroxine (SYNTHROID) 137 MCG tablet Take 1 tablet (137 mcg total) by mouth daily before breakfast.   . magnesium oxide (MAG-OX) 400 MG tablet Take 400 mg by mouth daily.  08/20/2015: Received from: External Pharmacy  . Misc Natural Products (TART CHERRY ADVANCED PO) Take by mouth daily.   . Multiple Vitamins-Minerals (PRESERVISION AREDS 2 PO) Take by mouth 2 (two) times daily.   Marland Kitchen omega-3 acid ethyl esters (LOVAZA) 1 G capsule Take 1 g by mouth 2 (two) times daily.   Marland Kitchen oxyCODONE (  OXY IR/ROXICODONE) 5 MG immediate release tablet Take 1 tablet (5 mg total) by mouth 3 (three) times daily as needed for severe pain.   . potassium chloride (KLOR-CON) 10 MEQ tablet Take 1 tablet (10 mEq total) by mouth daily.   . predniSONE (DELTASONE) 5 MG tablet Take 2 tablets (10 mg total) by mouth daily with breakfast.   . tiZANidine (ZANAFLEX) 4 MG tablet Take 1 tablet (4 mg total) by mouth 2 (two) times daily.   . TURMERIC PO Take by mouth daily.   . vitamin E 400 UNIT capsule Take 800 Units by mouth daily.  08/20/2015: Received from: External Pharmacy  . [DISCONTINUED] DULoxetine (CYMBALTA) 20 MG capsule Take 1 capsule (20 mg total) by mouth daily.   . [DISCONTINUED]  furosemide (LASIX) 40 MG tablet Take one tablet by mouth two times daily   . [DISCONTINUED] ibuprofen (ADVIL) 600 MG tablet Take 1 tablet (600 mg total) by mouth every 8 (eight) hours as needed.   . [DISCONTINUED] ketorolac (TORADOL) 30 MG/ML injection ketorolac 30 mg/mL injection syringe  Inject 1 mL by intramuscular route.   . [DISCONTINUED] losartan-hydrochlorothiazide (HYZAAR) 100-25 MG tablet Take 1 tablet by mouth daily.   . [DISCONTINUED] omeprazole (PRILOSEC) 20 MG capsule Take one capsule by mouth two times daily   . [DISCONTINUED] oxyCODONE (OXY IR/ROXICODONE) 5 MG immediate release tablet Take 1 tablet (5 mg total) by mouth 3 (three) times daily as needed for severe pain.   . mirtazapine (REMERON) 7.5 MG tablet Take 1 tablet (7.5 mg total) by mouth at bedtime.   . torsemide (DEMADEX) 20 MG tablet Take 1 tablet (20 mg total) by mouth daily.    No facility-administered encounter medications on file as of 04/16/2020.    Surgical History: Past Surgical History:  Procedure Laterality Date  . ABDOMINAL HYSTERECTOMY    . ANKLE SURGERY Left   . APPENDECTOMY    . CAROTID PTA/STENT INTERVENTION N/A 07/17/2019   Procedure: CAROTID PTA/STENT INTERVENTION;  Surgeon: Algernon Huxley, MD;  Location: [facility];  Service: Cardiovascular;  Laterality: N/A;  . CAROTID-SUBCLAVIAN BYPASS GRAFT N/A 07/26/2019   Procedure: BYPASS GRAFT CAROTID-SUBCLAVIAN ( CAROTID TO CAROTID BYPASS);  Surgeon: Algernon Huxley, MD;  Location: [facility];  Service: Vascular;  Laterality: N/A;  . CHOLECYSTECTOMY    . COLONOSCOPY WITH PROPOFOL N/A 12/04/2014   Procedure: COLONOSCOPY WITH PROPOFOL;  Surgeon: Lollie Sails, MD;  Location: [facility] END[facility]COPY;  Service: Endoscopy;  Laterality: N/A;  . FRACTURE SURGERY     Femur Fx; LT Ankle Pinning  . JOINT REPLACEMENT     RT TKR  . Patial Thyroid Resection      Medical History: Past Medical History:  Diagnosis Date  . Arthritis   . Asthma   . Atrial fibrillation  ([place])    per patient's daughter   . COPD (chronic obstructive pulmonary disease) ([place])   . Diverticulitis   . Gout   . Hyperlipidemia   . Hypertension   . Hypothyroidism   . 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 Microhematuria   . Mild mitral regurgitation   . Mild tricuspid regurgitation   . Osteopenia   . Pulmonary fibrosis ([place])   . Urinary incontinence     Family History: Family History  Problem Relation Age of Onset  . Heart Problems Mother   . Heart Problems Father   . Diabetes Father   . Lupus Sister   . Thyroid disease Sister   . Heart  Problems Sister   . Hypertension Son   . Diabetes Son        borderline   . Arthritis Daughter        in the left knee   . Diabetes Daughter   . Neuropathy Daughter   . Depression Daughter   . Anxiety disorder Daughter     Social History   Socioeconomic History  . Marital status: Married    Spouse name: Jeneen Rinks  . Number of children: Not on file  . Years of education: Not on file  . Highest education level: Not on file  Occupational History  . Not on file  Tobacco Use  . Smoking status: Former Smoker    Years: 1.00    Types: Cigarettes  . Smokeless tobacco: Never Used  Vaping Use  . Vaping Use: Never used  Substance and Sexual Activity  . Alcohol use: No  . Drug use: No  . Sexual activity: Not Currently  Other Topics Concern  . Not on file  Social History Narrative   Lives at home with husband in private residence   Social Determinants of Health   Financial Resource Strain:   . Difficulty of Paying Living Expenses: Not on file  Food Insecurity:   . Worried About Charity fundraiser in the Last Year: Not on file  . Ran Out of Food in the Last Year: Not on file  Transportation Needs:   . Lack of Transportation (Medical): Not on file  . Lack of Transportation (Non-Medical): Not on file  Physical Activity:   . Days of Exercise per Week: Not on file  . Minutes of Exercise per  Session: Not on file  Stress:   . Feeling of Stress : Not on file  Social Connections:   . Frequency of Communication with Friends and Family: Not on file  . Frequency of Social Gatherings with Friends and Family: Not on file  . Attends Religious Services: Not on file  . Active Member of Clubs or Organizations: Not on file  . Attends Archivist Meetings: Not on file  . Marital Status: Not on file  Intimate Partner Violence:   . Fear of Current or Ex-Partner: Not on file  . Emotionally Abused: Not on file  . Physically Abused: Not on file  . Sexually Abused: Not on file      Review of Systems  Constitutional: Negative for activity change, chills, fatigue and unexpected weight change.       Fatigue continues to improve.   HENT: Negative for congestion, postnasal drip, rhinorrhea, sneezing, sore throat, trouble swallowing and voice change.   Respiratory: Positive for shortness of breath. Negative for cough, chest tightness and wheezing.        With exertion.   Cardiovascular: Negative for chest pain and palpitations.       Blood pressure is stable.   Gastrointestinal: Negative for abdominal pain, constipation, diarrhea, nausea and vomiting.  Endocrine: Negative for cold intolerance, heat intolerance, polydipsia and polyuria.  Genitourinary: Negative for dysuria, frequency and urgency.  Musculoskeletal: Positive for myalgias and neck pain. Negative for arthralgias, back pain and joint swelling.        Improved neck tenderness after surgery. Continues to have severe lower back pain. Has done well with increased dosing of oxycodone. This has improved her low back pain some. This has improved her ability to participate in normal daily activities.   Skin: Negative for rash.  Allergic/Immunologic: Negative for environmental allergies.  Neurological: Positive  for headaches. Negative for dizziness, tremors and numbness.       Gets mild headache from time to time.   Hematological:  Negative for adenopathy. Does not bruise/bleed easily.  Psychiatric/Behavioral: Positive for dysphoric mood. Negative for behavioral problems (Depression), sleep disturbance and suicidal ideas. The patient is nervous/anxious.        She has increased depression and feelings of sadness since the passing of her husband.     Today's Vitals   04/16/20 0924  BP: 116/65  Pulse: 87  Resp: 16  Temp: (!) 97.5 F (36.4 C)  SpO2: 98%  Weight: 194 lb 12.8 oz (88.4 kg)  Height: 5\' 3"  (1.6 m)   Body mass index is 34.51 kg/m.  Physical Exam Vitals and nursing note reviewed.  Constitutional:      General: She is not in acute distress.    Appearance: Normal appearance. She is well-developed. She is not diaphoretic.  HENT:     Head: Normocephalic and atraumatic.     Nose: Nose normal.     Mouth/Throat:     Pharynx: No oropharyngeal exudate.  Eyes:     Pupils: Pupils are equal, round, and reactive to light.  Neck:     Thyroid: No thyromegaly.     Vascular: No JVD.     Trachea: No tracheal deviation.     Comments: Well healing surgical incision from carotid to carotid bypass Cardiovascular:     Rate and Rhythm: Normal rate and regular rhythm.     Heart sounds: Normal heart sounds. No murmur heard.  No friction rub. No gallop.      Comments: There is swelling of bilateral lower extremities.  Pulmonary:     Effort: Pulmonary effort is normal. No respiratory distress.     Breath sounds: Normal breath sounds. No wheezing or rales.  Chest:     Chest wall: No tenderness.  Abdominal:     Palpations: Abdomen is soft.  Musculoskeletal:        General: Normal range of motion.     Cervical back: Normal range of motion and neck supple.     Comments: Having severe low back pain, worse with bending and twisting at the waist. Worse with reaching, pushing, pulling, and lifting with the arms.  Increased pain medication dosing has improved overall pain management.    Lymphadenopathy:     Cervical: No  cervical adenopathy.  Skin:    General: Skin is warm and dry.  Neurological:     General: No focal deficit present.     Mental Status: She is alert and oriented to person, place, and time.     Cranial Nerves: No cranial nerve deficit.  Psychiatric:        Attention and Perception: Attention and perception normal.        Mood and Affect: Mood is depressed. Affect is tearful.        Speech: Speech normal.        Behavior: Behavior normal. Behavior is cooperative.        Thought Content: Thought content normal.        Cognition and Memory: Cognition and memory normal.        Judgment: Judgment normal.    Assessment/Plan: 1. Essential hypertension Stable. Continue bp medication as prescribed. Follow up with cardiology as scheduled.   2. Lower extremity edema Torsemide 20mg  should be taken daily.  - torsemide (DEMADEX) 20 MG tablet; Take 1 tablet (20 mg total) by mouth daily.  Dispense: 30  tablet; Refill: 1  3. [name] (nonalcoholic steatohepatitis) Will get new ultrasound of the abdomen for further evaluation and surveillance.  - US Abdomen Complete; Future  4. Abnormal renal function Recheck labs prior to next visit .  5. Inflammatory polyarthritis ([place]) May take ibuprofen 800mg  up to three times daily if needed for mild to moderate pain. Use sparingly since also on blood thinner. May take oxycodone 5mg  up to three times daily as needed for severe pain.  - oxyCODONE (OXY IR/ROXICODONE) 5 MG immediate release tablet; Take 1 tablet (5 mg total) by mouth 3 (three) times daily as needed for severe pain.  Dispense: 90 tablet; Refill: 0 - ibuprofen (ADVIL) 600 MG tablet; Take 1 tablet (600 mg total) by mouth every 8 (eight) hours as needed.  Dispense: 60 tablet; Refill: 1  6. Primary osteoarthritis of left hip May take ibuprofen 800mg  up to three times daily if needed for mild to moderate pain. Use sparingly since also on blood thinner.  - ibuprofen (ADVIL) 600 MG tablet; Take 1 tablet  (600 mg total) by mouth every 8 (eight) hours as needed.  Dispense: 60 tablet; Refill: 1  7. Current moderate episode of major depressive disorder, unspecified whether recurrent (HCC) Trial remeron 7.5mg  every evening. Encouraged her to speak with grief counselor through hospice to help cope with loss of her husband.  - mirtazapine (REMERON) 7.5 MG tablet; Take 1 tablet (7.5 mg total) by mouth at bedtime.  Dispense: 30 tablet; Refill: 1  8. Needs flu shot Flu vaccine administered in the office today.  - Flu Vaccine MDCK QUAD PF  General Counseling: Jinan verbalizes understanding of the findings of todays visit and agrees with plan of treatment. I have discussed any further diagnostic evaluation that may be needed or ordered today. We also reviewed her medications today. she has been encouraged to call the office with any questions or concerns that should arise related to todays visit.  This patient was seen by [name] FNP Collaboration with Dr [name] as a part of collaborative care agreement  Orders Placed This Encounter  Procedures  . US Abdomen Complete  . Flu Vaccine MDCK QUAD PF    Meds ordered this encounter  Medications  . torsemide (DEMADEX) 20 MG tablet    Sig: Take 1 tablet (20 mg total) by mouth daily.    Dispense:  30 tablet    Refill:  1    D/c furosemide    Order Specific Question:   Supervising Provider    Answer:   [name] [[id number]]  . oxyCODONE (OXY IR/ROXICODONE) 5 MG immediate release tablet    Sig: Take 1 tablet (5 mg total) by mouth 3 (three) times daily as needed for severe pain.    Dispense:  90 tablet    Refill:  0    Fill after 04/27/2020    Order Specific Question:   Supervising Provider    Answer:   [name] [[id number]]  . ibuprofen (ADVIL) 600 MG tablet    Sig: Take 1 tablet (600 mg total) by mouth every 8 (eight) hours as needed.    Dispense:  60 tablet    Refill:  1    Order Specific Question:   Supervising Provider    Answer:    [name] [[id number]]  . mirtazapine (REMERON) 7.5 MG tablet    Sig: Take 1 tablet (7.5 mg total) by mouth at bedtime.    Dispense:  30 tablet  Refill:  1    Order Specific Question:   Supervising Provider    Answer:   [name] [[id number]]    Total time spent: 30 Minutes   Time spent includes review of chart, medications, test results, and follow up plan with the patient.      Dr [name] Internal medicine

## 2020-04-18 NOTE — Progress Notes (Signed)
Office Visit Note  Patient: Martha Peterson             Date of Birth: [birth year]           MRN: [id number]             PCP: [name], MD Referring: [name], MD Visit Date: 05/02/2020 Occupation: @GUAROCC @  Subjective:  Other (back pain )   History of Present Illness: COZETTE BRAGGS is a 79 y.o. female with history of osteoarthritis, degenerative disc disease and gout.  She is accompanied by her daughter today.  She states she has been having a lot of discomfort in her lower back.  She continues to have discomfort in her hands and her knee joints.  She states she lost her husband last month.  And she is having difficult time.  Activities of Daily Living:  Patient reports morning stiffness for several hours.   Patient Reports nocturnal pain.  Difficulty dressing/grooming: Denies Difficulty climbing stairs: Reports Difficulty getting out of chair: Denies Difficulty using hands for taps, buttons, cutlery, and/or writing: Denies  Review of Systems  Constitutional: Negative for fatigue.  HENT: Negative for mouth sores, mouth dryness and nose dryness.   Eyes: Positive for dryness. Negative for pain and itching.  Respiratory: Negative for shortness of breath and difficulty breathing.   Cardiovascular: Negative for chest pain and palpitations.  Gastrointestinal: Negative for blood in stool, constipation and diarrhea.  Endocrine: Negative for increased urination.  Genitourinary: Negative for difficulty urinating.  Musculoskeletal: Positive for arthralgias, joint pain, joint swelling, myalgias, morning stiffness, muscle tenderness and myalgias.  Skin: Negative for color change, rash and redness.  Allergic/Immunologic: Negative for susceptible to infections.  Neurological: Negative for dizziness, numbness, headaches, memory loss and weakness.  Hematological: Positive for bruising/bleeding tendency.  Psychiatric/Behavioral: Negative for depressed mood, confusion and sleep  disturbance. The patient is not nervous/anxious.     PMFS History:  Patient Active Problem List   Diagnosis Date Noted  . Lower extremity edema 04/16/2020  . [name] (nonalcoholic steatohepatitis) 04/16/2020  . Encounter for general adult medical examination with abnormal findings 02/25/2020  . Chronic gout without tophus 02/25/2020  . Current moderate episode of major depressive disorder ([place]) 02/25/2020  . Tick bite of left hip 12/06/2019  . Encounter for screening mammogram for malignant neoplasm of breast 12/06/2019  . Screening for osteoporosis 12/06/2019  . S/P carotid endarterectomy 09/09/2019  . Iron deficiency anemia 09/09/2019  . Abnormal renal function 09/09/2019  . Hypomagnesemia 09/09/2019  . Hospital discharge follow-up 08/02/2019  . 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 Left ventricular systolic dysfunction 40/98/1191  . Diastolic dysfunction 47/82/9562  . Degenerative joint disease involving multiple joints 05/07/2019  . Nonalcoholic fatty liver disease 02/12/2019  . Dysuria 02/12/2019  . Encounter for long-term (current) use of medications 01/22/2019  . Hypokalemia 04/27/2018  . Osteopenia 04/04/2018  . Hypothyroidism (acquired) 02/14/2018  . Neutropenia ([place]) 02/14/2018  . Primary osteoarthritis of left hip 12/06/2017  . Essential hypertension 09/29/2017  . Postural dizziness 09/29/2017  . 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 Acquired left ventricular hypertrophy 09/29/2017  . Inflammatory polyarthritis ([place]) 09/29/2017  . Need for vaccination for Strep pneumoniae 09/29/2017  . H/O ankle fusion 05/02/2016  . 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 Abnormal LFTs 02/13/2014  . 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 Primary osteoarthritis of both hands 02/13/2014  . H/O:  gout 02/13/2014  . Rheumatoid factor positive 02/13/2014    Past  Medical History:  Diagnosis Date  . Arthritis   . Asthma   . Atrial fibrillation ([place])    per patient's daughter   . COPD (chronic obstructive pulmonary disease) ([place])   . Diverticulitis   . Gout   . Hyperlipidemia   . Hypertension   . Hypothyroidism   . 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 Microhematuria   . Mild mitral regurgitation   . Mild tricuspid regurgitation   . Osteopenia   . Pulmonary fibrosis ([place])   . Urinary incontinence     Family History  Problem Relation Age of Onset  . Heart Problems Mother   . Heart Problems Father   . Diabetes Father   . Lupus Sister   . Thyroid disease Sister   . Heart Problems Sister   . Hypertension Son   . Diabetes Son        borderline   . Arthritis Daughter        in the left knee   . Diabetes Daughter   . Neuropathy Daughter   . Depression Daughter   . Anxiety disorder Daughter    Past Surgical History:  Procedure Laterality Date  . ABDOMINAL HYSTERECTOMY    . ANKLE SURGERY Left   . APPENDECTOMY    . CAROTID PTA/STENT INTERVENTION N/A 07/17/2019   Procedure: CAROTID PTA/STENT INTERVENTION;  Surgeon: Algernon Huxley, MD;  Location: [facility];  Service: Cardiovascular;  Laterality: N/A;  . CAROTID-SUBCLAVIAN BYPASS GRAFT N/A 07/26/2019   Procedure: BYPASS GRAFT CAROTID-SUBCLAVIAN ( CAROTID TO CAROTID BYPASS);  Surgeon: Algernon Huxley, MD;  Location: [facility];  Service: Vascular;  Laterality: N/A;  . CHOLECYSTECTOMY    . COLONOSCOPY WITH PROPOFOL N/A 12/04/2014   Procedure: COLONOSCOPY WITH PROPOFOL;  Surgeon: Lollie Sails, MD;  Location: [facility];  Service: Endoscopy;  Laterality: N/A;  . FRACTURE SURGERY     Femur Fx; LT Ankle Pinning  . JOINT REPLACEMENT     RT TKR  . Patial Thyroid Resection     Social History   Social History Narrative   Lives at home with husband in private residence   Immunization History  Administered Date(s) Administered  . Influenza Inj Mdck Quad  Pf 03/18/2018, 04/14/2019, 04/16/2020  . Influenza-Unspecified 05/04/2017  . Moderna SARS-COVID-2 Vaccination 08/09/2019, 09/06/2019  . Pneumococcal Conjugate-13 03/01/2018  . Pneumococcal Polysaccharide-23 05/31/2015  . Zoster Recombinat (Shingrix) 01/25/2018, 04/18/2018, 03/31/2019     Objective: Vital Signs: BP 115/77 (BP Location: Left Arm, Patient Position: Sitting, Cuff Size: Normal)   Pulse (!) 130   Resp 15   Ht 5\' 3"  (1.6 m)   Wt 196 lb (88.9 kg)   BMI 34.72 kg/m    Physical Exam Vitals and nursing note reviewed.  Constitutional:      Appearance: She is well-developed.  HENT:     Head: Normocephalic and atraumatic.  Eyes:     Conjunctiva/sclera: Conjunctivae normal.  Cardiovascular:     Rate and Rhythm: Normal rate and regular rhythm.     Heart sounds: Normal heart sounds.  Pulmonary:     Effort: Pulmonary effort is normal.     Breath sounds: Normal breath sounds.  Abdominal:     General: Bowel sounds are normal.     Palpations: Abdomen is soft.  Musculoskeletal:     Cervical back: Normal range of motion.  Lymphadenopathy:     Cervical: No cervical adenopathy.  Skin:    General: Skin is warm and dry.     Capillary Refill: Capillary refill takes less than 2 seconds.  Neurological:     Mental Status: She is alert and oriented to person, place, and time.  Psychiatric:        Behavior: Behavior normal.      Musculoskeletal Exam: She had some limitation with lateral rotation.  Shoulder joints, elbow joints, wrist joints with good range of motion.  She had bilateral PIP and DIP thickening.  She had [place] prominence.  She had good range of motion of bilateral hip joints.  Right knee joint is replaced.  She had discomfort range of motion of her left knee joint which has severe osteoarthritis.  There was no tenderness over ankle joints.   CDAI Exam: CDAI Score: -- Patient Global: --; Provider Global: -- Swollen: --; Tender: -- Joint Exam 05/02/2020   No joint  exam has been documented for this visit   There is currently no information documented on the homunculus. Go to the Rheumatology activity and complete the homunculus joint exam.  Investigation: No additional findings.  Imaging: No results found.  Recent Labs: Lab Results  Component Value Date   WBC 3.7 02/06/2020   HGB 12.5 02/06/2020   PLT 158 02/06/2020   NA 139 04/11/2020   K 4.4 04/11/2020   CL 97 04/11/2020   CO2 27 04/11/2020   GLUCOSE 129 (H) 04/11/2020   BUN 28 (H) 04/11/2020   CREATININE 1.07 (H) 04/11/2020   BILITOT 0.9 02/06/2020   ALKPHOS 146 (H) 02/06/2020   AST 37 02/06/2020   ALT 22 02/06/2020   PROT 7.6 02/06/2020   ALBUMIN 4.3 02/06/2020   CALCIUM 9.9 04/11/2020   GFRAA 56 (L) 04/11/2020   April 11, 2020 uric acid 5.1 Speciality Comments: No specialty comments available.  Procedures:  No procedures performed Allergies: Codeine, Levaquin [levofloxacin], and Tape   Assessment / Plan:     Visit Diagnoses: Primary osteoarthritis of both hands-she has bilateral PIP DIP and CMC prominence.  No synovitis was noted.  Joint protection muscle strengthening was discussed.  Rheumatoid factor positive-she has no synovitis on examination.  Primary osteoarthritis of left hip-she had good range of motion without discomfort.  Although she has discomfort when she walks.  Status post total knee replacement, right-doing well.  Primary osteoarthritis of left knee-she has severe osteoarthritis and has ongoing pain in her left knee joint.  She does not want to have knee replacement surgery.  A handout on knee joint exercises was given.  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  I offered physical therapy which she declined.  I have given her a handout on back exercises.  History of gout - Allopurinol 200 mg po daily.  Uric acid is in desirable range.  Labs are stable.  She has not had any gout  flares.  Osteopenia, unspecified location -I reviewed her DEXA from 12/19/19  proximal left femur measures 0.674 gm/cm2.  The Z score is -0.1 and the T score is -2.2.  Use of calcium rich diet with vitamin D was discussed.  Resistive exercise and increase activity was discussed.  Other medical problems are listed as follows:  Carotid artery 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, bilateral - S/p carotid endarterectomy on 07/17/19.  She is followed by Dr. Lucky Cowboy.   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 Grayland Ormond.   Left ventricular hypertrophy  Essential hypertension-her blood pressure  is normal today  History of hypothyroidism  History of COPD  History of pulmonary fibrosis  Orders: Orders Placed This Encounter  Procedures  . COMPLETE METABOLIC PANEL WITH GFR   No orders of the defined types were placed in this encounter.    Follow-Up Instructions: Return in about 6 months (around 10/30/2020) for Osteoarthritis, Gout.   Bo Merino, MD  Note - This record has been created using Editor, commissioning.  Chart creation errors have been sought, but may not always  have been located. Such creation errors do not reflect on  the standard of medical care.

## 2020-04-19 ENCOUNTER — Other Ambulatory Visit: Payer: Self-pay

## 2020-04-19 ENCOUNTER — Other Ambulatory Visit: Payer: Self-pay | Admitting: Hospice and Palliative Medicine

## 2020-04-19 MED ORDER — OMEPRAZOLE 20 MG PO CPDR
DELAYED_RELEASE_CAPSULE | ORAL | 1 refills | Status: DC
Start: 2020-04-19 — End: 2020-04-23

## 2020-04-19 MED ORDER — DOXYCYCLINE HYCLATE 100 MG PO TABS: 100.0000 mg | ORAL_TABLET | Freq: Two times a day (BID) | ORAL | 0 refills | Status: DC

## 2020-04-19 MED ORDER — LOSARTAN POTASSIUM-HCTZ 100-25 MG PO TABS
1.0000 | ORAL_TABLET | Freq: Every day | ORAL | 1 refills | Status: DC
Start: 2020-04-19 — End: 2020-09-02

## 2020-04-23 ENCOUNTER — Other Ambulatory Visit: Payer: Self-pay

## 2020-04-23 MED ORDER — OMEPRAZOLE 20 MG PO CPDR
DELAYED_RELEASE_CAPSULE | ORAL | 1 refills | Status: DC
Start: 2020-04-23 — End: 2020-08-13

## 2020-04-25 ENCOUNTER — Other Ambulatory Visit: Payer: Self-pay

## 2020-05-02 ENCOUNTER — Ambulatory Visit: Payer: Medicare HMO | Admitting: Rheumatology

## 2020-05-02 ENCOUNTER — Encounter: Payer: Self-pay | Admitting: Rheumatology

## 2020-05-02 ENCOUNTER — Other Ambulatory Visit: Payer: Self-pay

## 2020-05-02 VITALS — BP 115/77 | HR 130 | Resp 15 | Ht 63.0 in | Wt 196.0 lb

## 2020-05-02 DIAGNOSIS — M1612 Unilateral primary osteoarthritis, left hip: Secondary | ICD-10-CM

## 2020-05-02 DIAGNOSIS — Z8739 Personal history of other diseases of the musculoskeletal system and connective tissue: Secondary | ICD-10-CM | POA: Diagnosis not present

## 2020-05-02 DIAGNOSIS — D472 Monoclonal gammopathy: Secondary | ICD-10-CM | POA: Diagnosis not present

## 2020-05-02 DIAGNOSIS — Z96651 Presence of right artificial knee joint: Secondary | ICD-10-CM

## 2020-05-02 DIAGNOSIS — Z8709 Personal history of other diseases of the respiratory system: Secondary | ICD-10-CM

## 2020-05-02 DIAGNOSIS — I6523 Occlusion and stenosis of bilateral carotid arteries: Secondary | ICD-10-CM | POA: Diagnosis not present

## 2020-05-02 DIAGNOSIS — R768 Other specified abnormal immunological findings in serum: Secondary | ICD-10-CM

## 2020-05-02 DIAGNOSIS — G8929 Other chronic pain: Secondary | ICD-10-CM

## 2020-05-02 DIAGNOSIS — M8580 Other specified disorders of bone density and structure, unspecified site: Secondary | ICD-10-CM

## 2020-05-02 DIAGNOSIS — Z5181 Encounter for therapeutic drug level monitoring: Secondary | ICD-10-CM

## 2020-05-02 DIAGNOSIS — I517 Cardiomegaly: Secondary | ICD-10-CM

## 2020-05-02 DIAGNOSIS — M19041 Primary osteoarthritis, right hand: Secondary | ICD-10-CM | POA: Diagnosis not present

## 2020-05-02 DIAGNOSIS — Z8639 Personal history of other endocrine, nutritional and metabolic disease: Secondary | ICD-10-CM

## 2020-05-02 DIAGNOSIS — M545 Low back pain, unspecified: Secondary | ICD-10-CM

## 2020-05-02 DIAGNOSIS — M1712 Unilateral primary osteoarthritis, left knee: Secondary | ICD-10-CM | POA: Diagnosis not present

## 2020-05-02 DIAGNOSIS — I10 Essential (primary) hypertension: Secondary | ICD-10-CM

## 2020-05-02 DIAGNOSIS — M19042 Primary osteoarthritis, left hand: Secondary | ICD-10-CM

## 2020-05-02 NOTE — Patient Instructions (Addendum)
Labs due in April CBC, CMP with GFR and Uric acid   Back Exercises The following exercises strengthen the muscles that help to support the trunk and back. They also help to keep the lower back flexible. Doing these exercises can help to prevent back pain or lessen existing pain.  If you have back pain or discomfort, try doing these exercises 2-3 times each day or as told by your health care provider.  As your pain improves, do them once each day, but increase the number of times that you repeat the steps for each exercise (do more repetitions).  To prevent the recurrence of back pain, continue to do these exercises once each day or as told by your health care provider. Do exercises exactly as told by your health care provider and adjust them as directed. It is normal to feel mild stretching, pulling, tightness, or discomfort as you do these exercises, but you should stop right away if you feel sudden pain or your pain gets worse. Exercises Single knee to chest Repeat these steps 3-5 times for each leg: 1. Lie on your back on a firm bed or the floor with your legs extended. 2. Bring one knee to your chest. Your other leg should stay extended and in contact with the floor. 3. Hold your knee in place by grabbing your knee or thigh with both hands and hold. 4. Pull on your knee until you feel a gentle stretch in your lower back or buttocks. 5. Hold the stretch for 10-30 seconds. 6. Slowly release and straighten your leg. Pelvic tilt Repeat these steps 5-10 times: 1. Lie on your back on a firm bed or the floor with your legs extended. 2. Bend your knees so they are pointing toward the ceiling and your feet are flat on the floor. 3. Tighten your lower abdominal muscles to press your lower back against the floor. This motion will tilt your pelvis so your tailbone points up toward the ceiling instead of pointing to your feet or the floor. 4. With gentle tension and even breathing, hold this  position for 5-10 seconds. Cat-cow Repeat these steps until your lower back becomes more flexible: 1. Get into a hands-and-knees position on a firm surface. Keep your hands under your shoulders, and keep your knees under your hips. You may place padding under your knees for comfort. 2. Let your head hang down toward your chest. Contract your abdominal muscles and point your tailbone toward the floor so your lower back becomes rounded like the back of a cat. 3. Hold this position for 5 seconds. 4. Slowly lift your head, let your abdominal muscles relax and point your tailbone up toward the ceiling so your back forms a sagging arch like the back of a cow. 5. Hold this position for 5 seconds.  Press-ups Repeat these steps 5-10 times: 1. Lie on your abdomen (face-down) on the floor. 2. Place your palms near your head, about shoulder-width apart. 3. Keeping your back as relaxed as possible and keeping your hips on the floor, slowly straighten your arms to raise the top half of your body and lift your shoulders. Do not use your back muscles to raise your upper torso. You may adjust the placement of your hands to make yourself more comfortable. 4. Hold this position for 5 seconds while you keep your back relaxed. 5. Slowly return to lying flat on the floor.  Bridges Repeat these steps 10 times: 1. Lie on your back on a  firm surface. 2. Bend your knees so they are pointing toward the ceiling and your feet are flat on the floor. Your arms should be flat at your sides, next to your body. 3. Tighten your buttocks muscles and lift your buttocks off the floor until your waist is at almost the same height as your knees. You should feel the muscles working in your buttocks and the back of your thighs. If you do not feel these muscles, slide your feet 1-2 inches farther away from your buttocks. 4. Hold this position for 3-5 seconds. 5. Slowly lower your hips to the starting position, and allow your buttocks  muscles to relax completely. If this exercise is too easy, try doing it with your arms crossed over your chest. Abdominal crunches Repeat these steps 5-10 times: 1. Lie on your back on a firm bed or the floor with your legs extended. 2. Bend your knees so they are pointing toward the ceiling and your feet are flat on the floor. 3. Cross your arms over your chest. 4. Tip your chin slightly toward your chest without bending your neck. 5. Tighten your abdominal muscles and slowly raise your trunk (torso) high enough to lift your shoulder blades a tiny bit off the floor. Avoid raising your torso higher than that because it can put too much stress on your low back and does not help to strengthen your abdominal muscles. 6. Slowly return to your starting position. Back lifts Repeat these steps 5-10 times: 1. Lie on your abdomen (face-down) with your arms at your sides, and rest your forehead on the floor. 2. Tighten the muscles in your legs and your buttocks. 3. Slowly lift your chest off the floor while you keep your hips pressed to the floor. Keep the back of your head in line with the curve in your back. Your eyes should be looking at the floor. 4. Hold this position for 3-5 seconds. 5. Slowly return to your starting position. Contact a health care provider if:  Your back pain or discomfort gets much worse when you do an exercise.  Your worsening back pain or discomfort does not lessen within 2 hours after you exercise. If you have any of these problems, stop doing these exercises right away. Do not do them again unless your health care provider says that you can. Get help right away if:  You develop sudden, severe back pain. If this happens, stop doing the exercises right away. Do not do them again unless your health care provider says that you can. This information is not intended to replace advice given to you by your health care provider. Make sure you discuss any questions you have with  your health care provider. Document Revised: 10/06/2018 Document Reviewed: 03/03/2018 Elsevier Patient Education  2020 [employer], 15(4), [phone]. Retrieved March 21, 2018 from [email or web address].com/[email or web address]">  Knee Exercises Ask your health care provider which exercises are safe for you. Do exercises exactly as told by your health care provider and adjust them as directed. It is normal to feel mild stretching, pulling, tightness, or discomfort as you do these exercises. Stop right away if you feel sudden pain or your pain gets worse. Do not begin these exercises until told by your health care provider. Stretching and range-of-motion exercises These exercises warm up your muscles and joints and improve the movement and flexibility of your knee. These exercises also help to relieve pain and swelling. Knee extension, prone 1. Lie on your  abdomen (prone position) on a bed. 2. Place your left / right knee just beyond the edge of the surface so your knee is not on the bed. You can put a towel under your left / right thigh just above your kneecap for comfort. 3. Relax your leg muscles and allow gravity to straighten your knee (extension). You should feel a stretch behind your left / right knee. 4. Hold this position for __________ seconds. 5. Scoot up so your knee is supported between repetitions. Repeat __________ times. Complete this exercise __________ times a day. Knee flexion, active  1. Lie on your back with both legs straight. If this causes back discomfort, bend your left / right knee so your foot is flat on the floor. 2. Slowly slide your left / right heel back toward your buttocks. Stop when you feel a gentle stretch in the front of your knee or thigh (flexion). 3. Hold this position for __________ seconds. 4. Slowly slide your left / right heel back to the starting position. Repeat __________ times. Complete this exercise __________ times a  day. Quadriceps stretch, prone  1. Lie on your abdomen on a firm surface, such as a bed or padded floor. 2. Bend your left / right knee and hold your ankle. If you cannot reach your ankle or pant leg, loop a belt around your foot and grab the belt instead. 3. Gently pull your heel toward your buttocks. Your knee should not slide out to the side. You should feel a stretch in the front of your thigh and knee (quadriceps). 4. Hold this position for __________ seconds. Repeat __________ times. Complete this exercise __________ times a day. Hamstring, supine 1. Lie on your back (supine position). 2. Loop a belt or towel over the ball of your left / right foot. The ball of your foot is on the walking surface, right under your toes. 3. Straighten your left / right knee and slowly pull on the belt to raise your leg until you feel a gentle stretch behind your knee (hamstring). ? Do not let your knee bend while you do this. ? Keep your other leg flat on the floor. 4. Hold this position for __________ seconds. Repeat __________ times. Complete this exercise __________ times a day. Strengthening exercises These exercises build strength and endurance in your knee. Endurance is the ability to use your muscles for a long time, even after they get tired. Quadriceps, isometric This exercise stretches the muscles in front of your thigh (quadriceps) without moving your knee joint (isometric). 1. Lie on your back with your left / right leg extended and your other knee bent. Put a rolled towel or small pillow under your knee if told by your health care provider. 2. Slowly tense the muscles in the front of your left / right thigh. You should see your kneecap slide up toward your hip or see increased dimpling just above the knee. This motion will push the back of the knee toward the floor. 3. For __________ seconds, hold the muscle as tight as you can without increasing your pain. 4. Relax the muscles slowly and  completely. Repeat __________ times. Complete this exercise __________ times a day. Straight leg raises This exercise stretches the muscles in front of your thigh (quadriceps) and the muscles that move your hips (hip flexors). 1. Lie on your back with your left / right leg extended and your other knee bent. 2. Tense the muscles in the front of your left / right thigh.  You should see your kneecap slide up or see increased dimpling just above the knee. Your thigh may even shake a bit. 3. Keep these muscles tight as you raise your leg 4-6 inches (10-15 cm) off the floor. Do not let your knee bend. 4. Hold this position for __________ seconds. 5. Keep these muscles tense as you lower your leg. 6. Relax your muscles slowly and completely after each repetition. Repeat __________ times. Complete this exercise __________ times a day. Hamstring, isometric 1. Lie on your back on a firm surface. 2. Bend your left / right knee about __________ degrees. 3. Dig your left / right heel into the surface as if you are trying to pull it toward your buttocks. Tighten the muscles in the back of your thighs (hamstring) to "dig" as hard as you can without increasing any pain. 4. Hold this position for __________ seconds. 5. Release the tension gradually and allow your muscles to relax completely for __________ seconds after each repetition. Repeat __________ times. Complete this exercise __________ times a day. Hamstring curls If told by your health care provider, do this exercise while wearing ankle weights. Begin with __________ lb weights. Then increase the weight by 1 lb (0.5 kg) increments. Do not wear ankle weights that are more than __________ lb. 1. Lie on your abdomen with your legs straight. 2. Bend your left / right knee as far as you can without feeling pain. Keep your hips flat against the floor. 3. Hold this position for __________ seconds. 4. Slowly lower your leg to the starting position. Repeat  __________ times. Complete this exercise __________ times a day. Squats This exercise strengthens the muscles in front of your thigh and knee (quadriceps). 1. Stand in front of a table, with your feet and knees pointing straight ahead. You may rest your hands on the table for balance but not for support. 2. Slowly bend your knees and lower your hips like you are going to sit in a chair. ? Keep your weight over your heels, not over your toes. ? Keep your lower legs upright so they are parallel with the table legs. ? Do not let your hips go lower than your knees. ? Do not bend lower than told by your health care provider. ? If your knee pain increases, do not bend as low. 3. Hold the squat position for __________ seconds. 4. Slowly push with your legs to return to standing. Do not use your hands to pull yourself to standing. Repeat __________ times. Complete this exercise __________ times a day. Wall slides This exercise strengthens the muscles in front of your thigh and knee (quadriceps). 1. Lean your back against a smooth wall or door, and walk your feet out 18-24 inches (46-61 cm) from it. 2. Place your feet hip-width apart. 3. Slowly slide down the wall or door until your knees bend __________ degrees. Keep your knees over your heels, not over your toes. Keep your knees in line with your hips. 4. Hold this position for __________ seconds. Repeat __________ times. Complete this exercise __________ times a day. Straight leg raises This exercise strengthens the muscles that rotate the leg at the hip and move it away from your body (hip abductors). 1. Lie on your side with your left / right leg in the top position. Lie so your head, shoulder, knee, and hip line up. You may bend your bottom knee to help you keep your balance. 2. Roll your hips slightly forward so your hips are  stacked directly over each other and your left / right knee is facing forward. 3. Leading with your heel, lift your top  leg 4-6 inches (10-15 cm). You should feel the muscles in your outer hip lifting. ? Do not let your foot drift forward. ? Do not let your knee roll toward the ceiling. 4. Hold this position for __________ seconds. 5. Slowly return your leg to the starting position. 6. Let your muscles relax completely after each repetition. Repeat __________ times. Complete this exercise __________ times a day. Straight leg raises This exercise stretches the muscles that move your hips away from the front of the pelvis (hip extensors). 1. Lie on your abdomen on a firm surface. You can put a pillow under your hips if that is more comfortable. 2. Tense the muscles in your buttocks and lift your left / right leg about 4-6 inches (10-15 cm). Keep your knee straight as you lift your leg. 3. Hold this position for __________ seconds. 4. Slowly lower your leg to the starting position. 5. Let your leg relax completely after each repetition. Repeat __________ times. Complete this exercise __________ times a day. This information is not intended to replace advice given to you by your health care provider. Make sure you discuss any questions you have with your health care provider. Document Revised: 03/22/2018 Document Reviewed: 03/22/2018 Else[employer] Patient Education  2020 [employer].

## 2020-05-03 ENCOUNTER — Other Ambulatory Visit (INDEPENDENT_AMBULATORY_CARE_PROVIDER_SITE_OTHER): Payer: Self-pay | Admitting: Vascular Surgery

## 2020-05-03 DIAGNOSIS — I6523 Occlusion and stenosis of bilateral carotid arteries: Secondary | ICD-10-CM

## 2020-05-03 DIAGNOSIS — I771 Stricture of artery: Secondary | ICD-10-CM

## 2020-05-07 ENCOUNTER — Encounter (INDEPENDENT_AMBULATORY_CARE_PROVIDER_SITE_OTHER): Payer: Self-pay | Admitting: Nurse Practitioner

## 2020-05-07 ENCOUNTER — Other Ambulatory Visit: Payer: Self-pay

## 2020-05-07 ENCOUNTER — Ambulatory Visit (INDEPENDENT_AMBULATORY_CARE_PROVIDER_SITE_OTHER): Payer: Medicare HMO | Admitting: Nurse Practitioner

## 2020-05-07 ENCOUNTER — Ambulatory Visit (INDEPENDENT_AMBULATORY_CARE_PROVIDER_SITE_OTHER): Payer: Medicare HMO

## 2020-05-07 VITALS — BP 122/81 | HR 92 | Resp 16 | Wt 196.6 lb

## 2020-05-07 DIAGNOSIS — I771 Stricture of artery: Secondary | ICD-10-CM

## 2020-05-07 DIAGNOSIS — R490 Dysphonia: Secondary | ICD-10-CM

## 2020-05-07 DIAGNOSIS — I6523 Occlusion and stenosis of bilateral carotid arteries: Secondary | ICD-10-CM | POA: Diagnosis not present

## 2020-05-07 DIAGNOSIS — I10 Essential (primary) hypertension: Secondary | ICD-10-CM | POA: Diagnosis not present

## 2020-05-08 ENCOUNTER — Ambulatory Visit: Payer: Medicare HMO

## 2020-05-08 DIAGNOSIS — Z23 Encounter for immunization: Secondary | ICD-10-CM | POA: Insufficient documentation

## 2020-05-08 DIAGNOSIS — K7581 Nonalcoholic steatohepatitis (NASH): Secondary | ICD-10-CM

## 2020-05-11 NOTE — Progress Notes (Signed)
Looks good. Discuss at next visit. 12/11

## 2020-05-13 ENCOUNTER — Encounter (INDEPENDENT_AMBULATORY_CARE_PROVIDER_SITE_OTHER): Payer: Self-pay | Admitting: Nurse Practitioner

## 2020-05-13 NOTE — Progress Notes (Signed)
Subjective:    Patient ID: Martha Peterson, female    DOB: 06/16/1940, 79 y.o.   MRN: [id number] Chief Complaint  Patient presents with  . Follow-up    47month ultrasound    The patient is seen for follow up evaluation of bypass graft for innominate artery occlusion. On 07/26/2019 the patient had a retropharyngeal left common carotid artery to right common carotid artery bypass with a right proximal common carotid artery ligation. The carotid stenosis followed by ultrasound.   The patient denies amaurosis fugax. There is no recent history of TIA symptoms or focal motor deficits. There is no prior documented CVA.  The patient is taking enteric-coated aspirin 81 mg daily.  There is no history of migraine headaches. There is no history of seizures.  The patient has a history of coronary artery disease, no recent episodes of angina or shortness of breath. The patient denies PAD or claudication symptoms. There is a history of hyperlipidemia which is being treated with a statin.    Carotid Duplex done today shows 1-39% bilaterally. The left common carotid artery to right common carotid artery bypass graft has no evidence of stenosis. 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 No change compared to last study in 11/07/2019   Review of Systems  Eyes: Negative for visual disturbance.  Cardiovascular: Positive for palpitations.  All other systems reviewed and are negative.      Objective:   Physical Exam Vitals reviewed.  HENT:     Head: Normocephalic.  Neck:     Vascular: No carotid bruit.  Cardiovascular:     Rate and Rhythm: Normal rate. Rhythm irregular.     Pulses: Normal pulses.  Pulmonary:     Effort: Pulmonary effort is normal.  Neurological:     Mental Status: She is alert and oriented to person, place, and time.  Psychiatric:        Mood and Affect: Mood normal.        Behavior: Behavior normal.        Thought Content:  Thought content normal.        Judgment: Judgment normal.     BP 122/81 (BP Location: Left Arm)   Pulse 92   Resp 16   Wt 196 lb 9.6 oz (89.2 kg)   BMI 34.83 kg/m   Past Medical History:  Diagnosis Date  . Arthritis   . Asthma   . Atrial fibrillation ([place])    per patient's daughter   . COPD (chronic obstructive pulmonary disease) ([place])   . Diverticulitis   . Gout   . Hyperlipidemia   . Hypertension   . Hypothyroidism   . 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 Microhematuria   . Mild mitral regurgitation   . Mild tricuspid regurgitation   . Osteopenia   . Pulmonary fibrosis ([place])   . Urinary incontinence     Social History   Socioeconomic History  . Marital status: Married    Spouse name: Martha Peterson  . Number of children: Not on file  . Years of education: Not on file  . Highest education level: Not on file  Occupational History  . Not on file  Tobacco Use  . Smoking status: Former Smoker    Years: 1.00    Types: Cigarettes  . Smokeless tobacco: Never Used  Vaping Use  . Vaping Use: Never used  Substance and Sexual Activity  . Alcohol use: No  .  Drug use: No  . Sexual activity: Not Currently  Other Topics Concern  . Not on file  Social History Narrative   Lives at home with husband in private residence   Social Determinants of Health   Financial Resource Strain:   . Difficulty of Paying Living Expenses: Not on file  Food Insecurity:   . Worried About Charity fundraiser in the Last Year: Not on file  . Ran Out of Food in the Last Year: Not on file  Transportation Needs:   . Lack of Transportation (Medical): Not on file  . Lack of Transportation (Non-Medical): Not on file  Physical Activity:   . Days of Exercise per Week: Not on file  . Minutes of Exercise per Session: Not on file  Stress:   . Feeling of Stress : Not on file  Social Connections:   . Frequency of Communication with Friends and Family: Not on file  .  Frequency of Social Gatherings with Friends and Family: Not on file  . Attends Religious Services: Not on file  . Active Member of Clubs or Organizations: Not on file  . Attends Archivist Meetings: Not on file  . Marital Status: Not on file  Intimate Partner Violence:   . Fear of Current or Ex-Partner: Not on file  . Emotionally Abused: Not on file  . Physically Abused: Not on file  . Sexually Abused: Not on file    Past Surgical History:  Procedure Laterality Date  . ABDOMINAL HYSTERECTOMY    . ANKLE SURGERY Left   . APPENDECTOMY    . CAROTID PTA/STENT INTERVENTION N/A 07/17/2019   Procedure: CAROTID PTA/STENT INTERVENTION;  Surgeon: Algernon Huxley, MD;  Location: [place] [facility];  Service: Cardiovascular;  Laterality: N/A;  . CAROTID-SUBCLAVIAN BYPASS GRAFT N/A 07/26/2019   Procedure: BYPASS GRAFT CAROTID-SUBCLAVIAN ( CAROTID TO CAROTID BYPASS);  Surgeon: Algernon Huxley, MD;  Location: [facility];  Service: Vascular;  Laterality: N/A;  . CHOLECYSTECTOMY    . COLONOSCOPY WITH PROPOFOL N/A 12/04/2014   Procedure: COLONOSCOPY WITH PROPOFOL;  Surgeon: Lollie Sails, MD;  Location: [facility] END[facility];  Service: Endoscopy;  Laterality: N/A;  . FRACTURE SURGERY     Femur Fx; LT Ankle Pinning  . JOINT REPLACEMENT     RT TKR  . Patial Thyroid Resection      Family History  Problem Relation Age of Onset  . Heart Problems Mother   . Heart Problems Father   . Diabetes Father   . Lupus Sister   . Thyroid disease Sister   . Heart Problems Sister   . Hypertension Son   . Diabetes Son        borderline   . Arthritis Daughter        in the left knee   . Diabetes Daughter   . Neuropathy Daughter   . Depression Daughter   . Anxiety disorder Daughter     Allergies  Allergen Reactions  . Codeine   . Levaquin [Levofloxacin]   . Tape     latex    CBC Latest Ref Rng & Units 02/06/2020 10/16/2019 10/03/2019  WBC 3.4 - 10.8 x10E3/uL 3.7 4.4 3.5  Hemoglobin 11.1 - 15.9 g/dL 12.5  11.6(L) 12.0  Hematocrit 34.0 - 46.6 % 35.9 35.1(L) 36.2  Platelets 150 - 450 x10E3/uL 158 136(L) 141(L)      CMP     Component Value Date/Time   NA 139 04/11/2020 1154   NA 135 (L) 02/02/2013  1038   K 4.4 04/11/2020 1154   K 3.8 02/02/2013 1038   CL 97 04/11/2020 1154   CL 102 02/02/2013 1038   CO2 27 04/11/2020 1154   CO2 29 02/02/2013 1038   GLUCOSE 129 (H) 04/11/2020 1154   GLUCOSE 106 (H) 10/16/2019 1102   GLUCOSE 111 (H) 02/02/2013 1038   BUN 28 (H) 04/11/2020 1154   BUN 15 02/02/2013 1038   CREATININE 1.07 (H) 04/11/2020 1154   CREATININE 0.99 02/20/2014 1019   CALCIUM 9.9 04/11/2020 1154   CALCIUM 8.9 02/20/2014 1019   PROT 7.6 02/06/2020 1221   PROT 8.3 (H) 08/05/2011 1020   ALBUMIN 4.3 02/06/2020 1221   ALBUMIN 3.6 08/05/2011 1020   AST 37 02/06/2020 1221   AST 75 (H) 08/05/2011 1020   ALT 22 02/06/2020 1221   ALT 90 (H) 08/05/2011 1020   ALKPHOS 146 (H) 02/06/2020 1221   ALKPHOS 141 (H) 08/05/2011 1020   BILITOT 0.9 02/06/2020 1221   BILITOT 0.7 08/05/2011 1020   GFRNONAA 49 (L) 04/11/2020 1154   GFRNONAA 56 (L) 02/20/2014 1019   GFRAA 56 (L) 04/11/2020 1154   GFRAA >60 02/20/2014 1019     No results found.     Assessment & Plan:   1. Hoarseness of voice The patient has continued to have a hoarse voice following the bypass. This is been ongoing since her procedure in February. We'll refer the patient to ENT for a evaluation to ensure there are obvious issues that need attention. - Ambulatory referral to ENT  2. 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HCC) Recommend:  Given the patient's asymptomatic subcritical stenosis no further invasive testing or surgery at this time.  Duplex ultrasound shows  1-39% stenosis bilaterally. Left right common carotid artery bypass is patent.  Continue antiplatelet therapy as prescribed Continue management of CAD, HTN and Hyperlipidemia Healthy heart diet,  encouraged exercise at least 4 times per week Follow up in 12  months with duplex ultrasound and physical exam   3. Essential hypertension Continue antihypertensive medications as already ordered, these medications have been reviewed and there are no changes at this time.    Current Outpatient Medications on File Prior to Visit  Medication Sig Dispense Refill  . allopurinol (ZYLOPRIM) 100 MG tablet Take 2 tablets (200 mg total) by mouth daily. 180 tablet 1  . apixaban (ELIQUIS) 5 MG TABS tablet Take 1 tablet (5 mg total) by mouth 2 (two) times daily. 60 tablet 5  . ascorbic acid (VITAMIN C) 500 MG tablet Take 500 mg by mouth daily.    Marland Kitchen aspirin EC 81 MG tablet Take 81 mg by mouth daily.    Marland Kitchen atorvastatin (LIPITOR) 10 MG tablet Take 1 tablet (10 mg total) by mouth daily at 6 PM. 90 tablet 1  . carvedilol (COREG) 25 MG tablet TAKE 1/2 TABLET TWICE DAILY WITH MEALS 90 tablet 1  . cholecalciferol (VITAMIN D) 400 UNITS TABS tablet Take 1,000 Units by mouth.    . diclofenac sodium (VOLTAREN) 1 % GEL Apply 3 grams to 3 large joints, up to 3 times daily as needed. 3 Tube 3  . Ginger, Zingiber officinalis, (GINGER PO) Take by mouth.    Marland Kitchen ibuprofen (ADVIL) 600 MG tablet Take 1 tablet (600 mg total) by mouth every 8 (eight) hours as needed. 60 tablet 1  . levothyroxine (SYNTHROID) 137 MCG tablet Take 1 tablet (137 mcg total) by mouth daily before breakfast. 90 tablet 1  . losartan-hydrochlorothiazide (HYZAAR) 100-25 MG tablet Take 1  tablet by mouth daily. 90 tablet 1  . magnesium oxide (MAG-OX) 400 MG tablet Take 400 mg by mouth daily.     . mirtazapine (REMERON) 7.5 MG tablet Take 1 tablet (7.5 mg total) by mouth at bedtime. 30 tablet 1  . Misc Natural Products (TART CHERRY ADVANCED PO) Take by mouth daily.    . Multiple Vitamins-Minerals (PRESERVISION AREDS 2 PO) Take by mouth 2 (two) times daily.    Marland Kitchen omega-3 acid ethyl esters (LOVAZA) 1 G capsule Take 1 g by mouth 2 (two) times daily.    Marland Kitchen omeprazole (PRILOSEC) 20 MG capsule Take one capsule by mouth two  times daily 180 capsule 1  . oxyCODONE (OXY IR/ROXICODONE) 5 MG immediate release tablet Take 1 tablet (5 mg total) by mouth 3 (three) times daily as needed for severe pain. 90 tablet 0  . potassium chloride (KLOR-CON) 10 MEQ tablet Take 1 tablet (10 mEq total) by mouth daily. 90 tablet 1  . predniSONE (DELTASONE) 5 MG tablet Take 2 tablets (10 mg total) by mouth daily with breakfast. 180 tablet 1  . tiZANidine (ZANAFLEX) 4 MG tablet Take 1 tablet (4 mg total) by mouth 2 (two) times daily. 90 tablet 1  . torsemide (DEMADEX) 20 MG tablet Take 1 tablet (20 mg total) by mouth daily. 30 tablet 1  . TURMERIC PO Take by mouth daily.    . vitamin E 400 UNIT capsule Take 800 Units by mouth daily.      No current facility-administered medications on file prior to visit.    There are no Patient Instructions on file for this visit. No follow-ups on file.   Kris Hartmann, NP

## 2020-05-17 ENCOUNTER — Other Ambulatory Visit: Payer: Self-pay

## 2020-05-17 DIAGNOSIS — M153 Secondary multiple arthritis: Secondary | ICD-10-CM

## 2020-05-17 MED ORDER — TIZANIDINE HCL 4 MG PO TABS: 4.0000 mg | ORAL_TABLET | Freq: Two times a day (BID) | ORAL | 1 refills | Status: DC

## 2020-05-20 ENCOUNTER — Ambulatory Visit: Payer: Medicare HMO | Admitting: Cardiology

## 2020-05-20 ENCOUNTER — Other Ambulatory Visit: Payer: Self-pay

## 2020-05-20 ENCOUNTER — Other Ambulatory Visit: Payer: Self-pay | Admitting: Nurse Practitioner

## 2020-05-20 ENCOUNTER — Encounter: Payer: Self-pay | Admitting: Cardiology

## 2020-05-20 VITALS — BP 110/74 | HR 50 | Ht 63.0 in | Wt 203.0 lb

## 2020-05-20 DIAGNOSIS — I4891 Unspecified atrial fibrillation: Secondary | ICD-10-CM

## 2020-05-20 DIAGNOSIS — I6523 Occlusion and stenosis of bilateral carotid arteries: Secondary | ICD-10-CM

## 2020-05-20 DIAGNOSIS — I5189 Other ill-defined heart diseases: Secondary | ICD-10-CM

## 2020-05-20 DIAGNOSIS — I10 Essential (primary) hypertension: Secondary | ICD-10-CM | POA: Diagnosis not present

## 2020-05-20 DIAGNOSIS — N289 Disorder of kidney and ureter, unspecified: Secondary | ICD-10-CM | POA: Diagnosis not present

## 2020-05-20 MED ORDER — CARVEDILOL 6.25 MG PO TABS
6.2500 mg | ORAL_TABLET | Freq: Two times a day (BID) | ORAL | 5 refills | Status: DC
Start: 2020-05-20 — End: 2020-08-02

## 2020-05-20 NOTE — Progress Notes (Signed)
Cardiology Office Note:    Date:  05/20/2020   ID:  Martha Peterson, DOB [birth year], MRN [id number]  PCP:  [name], MD  Cardiologist:  No primary care provider on file.  Electrophysiologist:  None   Referring MD: [name], MD   Chief Complaint  Patient presents with  . Follow-up    1 month  Pt states no new Sx.    History of Present Illness:    Martha Peterson is a 79 y.o. female with a hx of  atrial fibrillation, hypertension, hyperlipidemia, carotid stenosis, innominate artery stenosis who presents for follow-up.  Previously seen for new onset atrial fibrillation.  And low blood pressures.  Clonidine was titrated off.  Eliquis was started, Coreg was continued.  Echocardiogram showed severely dilated left atrium.  Rate control strategy was pursued.  States doing okay, denies palpitations, shortness of breath, dizziness.  Blood pressures have been controlled.   Historical notes  Patient has a history of mitral regurgitation being followed periodically with echocardiogram.  Last outside echocardiogram obtained on 05/26/2019, showed mildly reduced EF of 45%, impaired relaxation/grade 1 diastolic dysfunction, moderate concentric LVH, trivial MR, TR, aortic sclerosis no stenosis.  She denies any symptoms of chest pain or shortness of breath at rest or with activity.  She takes Coreg, losartan, HCTZ.  She has a known history of carotid stenosis with carotid bruit.CT angio of the neck showed high-grade calcific stenosis innominate artery, flow-limiting stenosis in the right carotid system.  There is sclerosis with calcification in the carotid bifurcation bilaterally without significant stenosis.  She is seeing vascular surgery for carotid artery disease.  She takes a baby aspirin and Lipitor 10 mg.  Has a history of liver abnormality hence the low-dose statin.     Past Medical History:  Diagnosis Date  . Arthritis   . Asthma   . Atrial fibrillation ([place])    per  patient's daughter   . COPD (chronic obstructive pulmonary disease) ([place])   . Diverticulitis   . Gout   . Hyperlipidemia   . Hypertension   . Hypothyroidism   . 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 Microhematuria   . Mild mitral regurgitation   . Mild tricuspid regurgitation   . Osteopenia   . Pulmonary fibrosis ([place])   . Urinary incontinence     Past Surgical History:  Procedure Laterality Date  . ABDOMINAL HYSTERECTOMY    . ANKLE SURGERY Left   . APPENDECTOMY    . CAROTID PTA/STENT INTERVENTION N/A 07/17/2019   Procedure: CAROTID PTA/STENT INTERVENTION;  Surgeon: Algernon Huxley, MD;  Location: [place] [facility];  Service: Cardiovascular;  Laterality: N/A;  . CAROTID-SUBCLAVIAN BYPASS GRAFT N/A 07/26/2019   Procedure: BYPASS GRAFT CAROTID-SUBCLAVIAN ( CAROTID TO CAROTID BYPASS);  Surgeon: Algernon Huxley, MD;  Location: [facility] ORS;  Service: Vascular;  Laterality: N/A;  . CHOLECYSTECTOMY    . COLONOSCOPY WITH PROPOFOL N/A 12/04/2014   Procedure: COLONOSCOPY WITH PROPOFOL;  Surgeon: Lollie Sails, MD;  [facility];  Service: Endoscopy;  Laterality: N/A;  . FRACTURE SURGERY     Femur Fx; LT Ankle Pinning  . JOINT REPLACEMENT     RT TKR  . Patial Thyroid Resection      Current Medications: Current Meds  Medication Sig  . allopurinol (ZYLOPRIM) 100 MG tablet Take 2 tablets (200 mg total) by mouth daily.  Marland Kitchen apixaban (ELIQUIS) 5 MG TABS tablet Take 1 tablet (5 mg total) by mouth  2 (two) times daily.  Marland Kitchen ascorbic acid (VITAMIN C) 500 MG tablet Take 500 mg by mouth daily.  Marland Kitchen aspirin EC 81 MG tablet Take 81 mg by mouth daily.  Marland Kitchen atorvastatin (LIPITOR) 10 MG tablet Take 1 tablet (10 mg total) by mouth daily at 6 PM.  . carvedilol (COREG) 6.25 MG tablet Take 1 tablet (6.25 mg total) by mouth 2 (two) times daily with a meal.  . cholecalciferol (VITAMIN D) 400 UNITS TABS tablet Take 1,000 Units by mouth.  . diclofenac sodium (VOLTAREN) 1 % GEL  Apply 3 grams to 3 large joints, up to 3 times daily as needed.  . Ginger, Zingiber officinalis, (GINGER PO) Take by mouth.  Marland Kitchen ibuprofen (ADVIL) 600 MG tablet Take 1 tablet (600 mg total) by mouth every 8 (eight) hours as needed.  Marland Kitchen levothyroxine (SYNTHROID) 137 MCG tablet Take 1 tablet (137 mcg total) by mouth daily before breakfast.  . losartan-hydrochlorothiazide (HYZAAR) 100-25 MG tablet Take 1 tablet by mouth daily.  . magnesium oxide (MAG-OX) 400 MG tablet Take 400 mg by mouth daily.   . mirtazapine (REMERON) 7.5 MG tablet Take 1 tablet (7.5 mg total) by mouth at bedtime.  . Misc Natural Products (TART CHERRY ADVANCED PO) Take by mouth daily.  . Multiple Vitamins-Minerals (PRESERVISION AREDS 2 PO) Take by mouth 2 (two) times daily.  Marland Kitchen omega-3 acid ethyl esters (LOVAZA) 1 G capsule Take 1 g by mouth 2 (two) times daily.  Marland Kitchen omeprazole (PRILOSEC) 20 MG capsule Take one capsule by mouth two times daily  . oxyCODONE (OXY IR/ROXICODONE) 5 MG immediate release tablet Take 1 tablet (5 mg total) by mouth 3 (three) times daily as needed for severe pain.  . potassium chloride (KLOR-CON) 10 MEQ tablet Take 1 tablet (10 mEq total) by mouth daily.  . predniSONE (DELTASONE) 5 MG tablet Take 2 tablets (10 mg total) by mouth daily with breakfast.  . tiZANidine (ZANAFLEX) 4 MG tablet Take 1 tablet (4 mg total) by mouth 2 (two) times daily.  Marland Kitchen torsemide (DEMADEX) 20 MG tablet Take 1 tablet (20 mg total) by mouth daily.  . TURMERIC PO Take by mouth daily.  . vitamin E 400 UNIT capsule Take 800 Units by mouth daily.   . [DISCONTINUED] carvedilol (COREG) 25 MG tablet TAKE 1/2 TABLET TWICE DAILY WITH MEALS     Allergies:   Codeine, Levaquin [levofloxacin], and Tape   Social History   Socioeconomic History  . Marital status: Married    Spouse name: Jeneen Rinks  . Number of children: Not on file  . Years of education: Not on file  . Highest education level: Not on file  Occupational History  . Not on file   Tobacco Use  . Smoking status: Former Smoker    Years: 1.00    Types: Cigarettes  . Smokeless tobacco: Never Used  Vaping Use  . Vaping Use: Never used  Substance and Sexual Activity  . Alcohol use: No  . Drug use: No  . Sexual activity: Not Currently  Other Topics Concern  . Not on file  Social History Narrative   Lives at home with husband in private residence   Social Determinants of Health   Financial Resource Strain:   . Difficulty of Paying Living Expenses: Not on file  Food Insecurity:   . Worried About Charity fundraiser in the Last Year: Not on file  . Ran Out of Food in the Last Year: Not on file  Transportation Needs:   .  Lack of Transportation (Medical): Not on file  . Lack of Transportation (Non-Medical): Not on file  Physical Activity:   . Days of Exercise per Week: Not on file  . Minutes of Exercise per Session: Not on file  Stress:   . Feeling of Stress : Not on file  Social Connections:   . Frequency of Communication with Friends and Family: Not on file  . Frequency of Social Gatherings with Friends and Family: Not on file  . Attends Religious Services: Not on file  . Active Member of Clubs or Organizations: Not on file  . Attends Archivist Meetings: Not on file  . Marital Status: Not on file     Family History: The patient's family history includes Anxiety disorder in her daughter; Arthritis in her daughter; Depression in her daughter; Diabetes in her daughter, father, and son; Heart Problems in her father, mother, and sister; Hypertension in her son; Lupus in her sister; Neuropathy in her daughter; Thyroid disease in her sister.  ROS:   Please see the history of present illness.     All other systems reviewed and are negative.  EKGs/Labs/Other Studies Reviewed:      EKG:  EKG not  ordered today.   Recent Labs: 10/03/2019: Magnesium 1.6 02/06/2020: ALT 22; Hemoglobin 12.5; Platelets 158 04/11/2020: BUN 28; Creatinine, Ser 1.07;  Potassium 4.4; Sodium 139; TSH 1.650  Recent Lipid Panel    Component Value Date/Time   CHOL 147 02/06/2020 1221   TRIG 140 02/06/2020 1221   HDL 46 02/06/2020 1221   LDLCALC 77 02/06/2020 1221    Physical Exam:    VS:  BP 110/74   Pulse (!) 50   Ht 5\' 3"  (1.6 m)   Wt 203 lb (92.1 kg)   BMI 35.96 kg/m     Wt Readings from Last 3 Encounters:  05/20/20 203 lb (92.1 kg)  05/07/20 196 lb 9.6 oz (89.2 kg)  05/02/20 196 lb (88.9 kg)     GEN:  Well nourished, well developed in no acute distress HEENT: Normal NECK: No JVD; bilateral carotid bruits noted LYMPHATICS: No lymphadenopathy CARDIAC: irreglar irregular, 2/6 systolic murmur noted, RESPIRATORY:  Clear to auscultation without rales, wheezing or rhonchi  ABDOMEN: Soft, non-tender, non-distended MUSCULOSKELETAL:1+ edema;. SKIN: Warm and dry NEUROLOGIC:  Alert and oriented x 3 PSYCHIATRIC:  Normal affect   ASSESSMENT:    1. Atrial fibrillation, unspecified type ([place])   2. Grade II diastolic dysfunction   3. Essential hypertension   4. Bilateral carotid artery stenosis    PLAN:    In order of problems listed above:  1. Persistent atrial fibrillation, exam today shows irregular irregular rhythm. CHA2DS2-VASc of 5 (age, htn, vasc, gender).  Decrease Coreg to 6.25 mg twice daily, continue Eliquis 5 mg twice daily. 2.  echocardiogram on 10/05/2019 showed normal systolic function, EF 60 to 33%, grade 2 diastolic dysfunction, severely dilated left atrium, aortic sclerosis, mild MR. Continue Lasix. 3. History of hypertension, blood pressure improved, normal.  Continue losartan/HCTZ, decrease Coreg to 6.25 mg twice daily 4. History of carotid artery disease, aspirin, Eliquis, Lipitor.  Follows up with vascular surgery.  Follow-up 3 months  This note was generated in part or whole with voice recognition software. Voice recognition is usually quite accurate but there are transcription errors that can and very often do occur. I  apologize for any typographical errors that were not detected and corrected.  Medication Adjustments/Labs and Tests Ordered: Current medicines are reviewed at length with  the patient today.  Concerns regarding medicines are outlined above.  No orders of the defined types were placed in this encounter.  Meds ordered this encounter  Medications  . carvedilol (COREG) 6.25 MG tablet    Sig: Take 1 tablet (6.25 mg total) by mouth 2 (two) times daily with a meal.    Dispense:  60 tablet    Refill:  5    Patient Instructions  Medication Instructions:  Your physician has recommended you make the following change in your medication:   1.  DECREASE your Coreg (Carvedilol) to 6.25 MG: Take one tab my mouth twice daily.  *If you need a refill on your cardiac medications before your next appointment, please call your pharmacy*   Lab Work: None Ordered  If you have labs (blood work) drawn today and your tests are completely normal, you will receive your results only by: Marland Kitchen MyChart Message (if you have MyChart) OR . A paper copy in the mail If you have any lab test that is abnormal or we need to change your treatment, we will call you to review the results.   Testing/Procedures: None Ordered   Follow-Up: At [facility], you and your health needs are our priority.  As part of our continuing mission to provide you with exceptional heart care, we have created designated Provider Care Teams.  These Care Teams include your primary Cardiologist (physician) and Advanced Practice Providers (APPs -  Physician Assistants and Nurse Practitioners) who all work together to provide you with the care you need, when you need it.  We recommend signing up for the patient portal called "MyChart".  Sign up information is provided on this After Visit Summary.  MyChart is used to connect with patients for Virtual Visits (Telemedicine).  Patients are able to view lab/test results, encounter notes, upcoming  appointments, etc.  Non-urgent messages can be sent to your provider as well.   To learn more about what you can do with MyChart, go to [email or web address].    Your next appointment:   3 month(s)  The format for your next appointment:   In Person  Provider:   Kate Sable, MD   Other Instructions      Signed, Kate Sable, MD  05/20/2020 12:47 PM    [place]

## 2020-05-20 NOTE — Patient Instructions (Signed)
Medication Instructions:  Your physician has recommended you make the following change in your medication:   1.  DECREASE your Coreg (Carvedilol) to 6.25 MG: Take one tab my mouth twice daily.  *If you need a refill on your cardiac medications before your next appointment, please call your pharmacy*   Lab Work: None Ordered  If you have labs (blood work) drawn today and your tests are completely normal, you will receive your results only by: Marland Kitchen MyChart Message (if you have MyChart) OR . A paper copy in the mail If you have any lab test that is abnormal or we need to change your treatment, we will call you to review the results.   Testing/Procedures: None Ordered   Follow-Up: At [facility], you and your health needs are our priority.  As part of our continuing mission to provide you with exceptional heart care, we have created designated Provider Care Teams.  These Care Teams include your primary Cardiologist (physician) and Advanced Practice Providers (APPs -  Physician Assistants and Nurse Practitioners) who all work together to provide you with the care you need, when you need it.  We recommend signing up for the patient portal called "MyChart".  Sign up information is provided on this After Visit Summary.  MyChart is used to connect with patients for Virtual Visits (Telemedicine).  Patients are able to view lab/test results, encounter notes, upcoming appointments, etc.  Non-urgent messages can be sent to your provider as well.   To learn more about what you can do with MyChart, go to [email or web address].    Your next appointment:   3 month(s)  The format for your next appointment:   In Person  Provider:   Kate Sable, MD   Other Instructions

## 2020-05-21 LAB — BASIC METABOLIC PANEL
BUN/Creatinine Ratio: 31 — ABNORMAL HIGH (ref 12–28)
BUN: 40 mg/dL — ABNORMAL HIGH (ref 8–27)
CO2: 28 mmol/L (ref 20–29)
Calcium: 9.3 mg/dL (ref 8.7–10.3)
Chloride: 99 mmol/L (ref 96–106)
Creatinine, Ser: 1.31 mg/dL — ABNORMAL HIGH (ref 0.57–1.00)
GFR calc Af Amer: 44 mL/min/{1.73_m2} — ABNORMAL LOW (ref >59)
GFR calc non Af Amer: 38 mL/min/{1.73_m2} — ABNORMAL LOW (ref >59)
Glucose: 129 mg/dL — ABNORMAL HIGH (ref 65–99)
Potassium: 4.8 mmol/L (ref 3.5–5.2)
Sodium: 142 mmol/L (ref 134–144)

## 2020-05-27 ENCOUNTER — Encounter: Payer: Self-pay | Admitting: Nurse Practitioner

## 2020-05-27 ENCOUNTER — Ambulatory Visit (INDEPENDENT_AMBULATORY_CARE_PROVIDER_SITE_OTHER): Payer: Medicare HMO | Admitting: Nurse Practitioner

## 2020-05-27 ENCOUNTER — Other Ambulatory Visit: Payer: Self-pay

## 2020-05-27 VITALS — BP 132/80 | HR 129 | Temp 97.3°F | Resp 16 | Ht 63.0 in | Wt 202.6 lb

## 2020-05-27 DIAGNOSIS — I10 Essential (primary) hypertension: Secondary | ICD-10-CM

## 2020-05-27 DIAGNOSIS — F321 Major depressive disorder, single episode, moderate: Secondary | ICD-10-CM

## 2020-05-27 DIAGNOSIS — Z9889 Other specified postprocedural states: Secondary | ICD-10-CM | POA: Diagnosis not present

## 2020-05-27 DIAGNOSIS — M064 Inflammatory polyarthropathy: Secondary | ICD-10-CM

## 2020-05-27 DIAGNOSIS — K7581 Nonalcoholic steatohepatitis (NASH): Secondary | ICD-10-CM | POA: Diagnosis not present

## 2020-05-27 MED ORDER — OXYCODONE HCL 5 MG PO TABS: 5.0000 mg | ORAL_TABLET | Freq: Every day | ORAL | 0 refills | Status: DC | PRN

## 2020-05-27 MED ORDER — OXYCODONE ER 13.5 MG PO C12A: 13.5000 mg | EXTENDED_RELEASE_CAPSULE | Freq: Every day | ORAL | 0 refills | Status: DC | PRN

## 2020-05-27 NOTE — Progress Notes (Signed)
Reviewed with patient during visit. Decreased allopurinol dosing to onc tablet daily from two tablets daily.

## 2020-05-27 NOTE — Progress Notes (Signed)
[facility] [place], [place] [address]  Internal MEDICINE  Office Visit Note  Patient Name: Martha Peterson  [id number]  [id number]  Date of Service: 06/23/2020  Chief Complaint  Patient presents with  . Follow-up    Review [place] and labs, discuss covid booster  . Hyperlipidemia  . Hypertension  . Asthma  . COPD    The patient is here for follow up visit.   left arm pain and swelling some improved. Hematoma present in left upper arm. No redness or warmth associated.  -Continues to be treated for a fib per cardiology -continues to have elevated renal functions. Allopurinol dosing increased to two tablets daily per rheumatology. Does not remember her last gout flare. Curious if increased dosing could be causing elevated renal functions. Will cut dose to one tablet daily. Recheck BMP after next visit. Renal function elevation may also be due to chronicity of a-fib. -Increased her pain medication to take up to three times daily as needed for severe pain. She has been having increased pain which is not controlled by tramadol at all.  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 She can no longer take ibuprofen due to prescription for eliquis twice daily.  She states that this increased dosing of pain medication has been helpful, however, she does not like taking so many tablets every day.  Would like to know if there are other options which may last longer, requiring less often dosing.   Family member has brought form from [place] to help patient get some assistance. ADLs she is having difficulty with 1. Unable to stand and prepare meals 2. Medication management 3. Finance management      Current Medication: Outpatient Encounter Medications as of 05/27/2020  Medication Sig Note  . allopurinol (ZYLOPRIM) 100 MG tablet Take 2 tablets (200 mg total) by mouth daily.   Marland Kitchen apixaban (ELIQUIS) 5 MG TABS  tablet Take 1 tablet (5 mg total) by mouth 2 (two) times daily.   Marland Kitchen ascorbic acid (VITAMIN C) 500 MG tablet Take 500 mg by mouth daily.   Marland Kitchen aspirin EC 81 MG tablet Take 81 mg by mouth daily.   Marland Kitchen atorvastatin (LIPITOR) 10 MG tablet Take 1 tablet (10 mg total) by mouth daily at 6 PM.   . carvedilol (COREG) 6.25 MG tablet Take 1 tablet (6.25 mg total) by mouth 2 (two) times daily with a meal.   . cholecalciferol (VITAMIN D) 400 UNITS TABS tablet Take 1,000 Units by mouth.   . diclofenac sodium (VOLTAREN) 1 % GEL Apply 3 grams to 3 large joints, up to 3 times daily as needed.   . Ginger, Zingiber officinalis, (GINGER PO) Take by mouth.   Marland Kitchen ibuprofen (ADVIL) 600 MG tablet Take 1 tablet (600 mg total) by mouth every 8 (eight) hours as needed.   Marland Kitchen levothyroxine (SYNTHROID) 137 MCG tablet Take 1 tablet (137 mcg total) by mouth daily before breakfast.   . losartan-hydrochlorothiazide (HYZAAR) 100-25 MG tablet Take 1 tablet by mouth daily.   . magnesium oxide (MAG-OX) 400 MG tablet Take 400 mg by mouth daily.  08/20/2015: Received from: External Pharmacy  . mirtazapine (REMERON) 7.5 MG tablet Take 1 tablet (7.5 mg total) by mouth at bedtime.   . Misc Natural Products (TART CHERRY ADVANCED PO) Take by mouth daily.   . Multiple Vitamins-Minerals (PRESERVISION AREDS 2 PO) Take by mouth 2 (two) times  daily.   . omega-3 acid ethyl esters (LOVAZA) 1 G capsule Take 1 g by mouth 2 (two) times daily.   Marland Kitchen omeprazole (PRILOSEC) 20 MG capsule Take one capsule by mouth two times daily   . potassium chloride (KLOR-CON) 10 MEQ tablet Take 1 tablet (10 mEq total) by mouth daily.   . predniSONE (DELTASONE) 5 MG tablet Take 2 tablets (10 mg total) by mouth daily with breakfast.   . tiZANidine (ZANAFLEX) 4 MG tablet Take 1 tablet (4 mg total) by mouth 2 (two) times daily.   Marland Kitchen torsemide (DEMADEX) 20 MG tablet Take 1 tablet (20 mg total) by mouth daily.   . TURMERIC PO Take by mouth daily.   . vitamin E 400 UNIT capsule Take  800 Units by mouth daily.  08/20/2015: Received from: External Pharmacy  . [DISCONTINUED] oxyCODONE (OXY IR/ROXICODONE) 5 MG immediate release tablet Take 1 tablet (5 mg total) by mouth 3 (three) times daily as needed for severe pain.   Marland Kitchen oxyCODONE (OXY IR/ROXICODONE) 5 MG immediate release tablet Take 1 tablet (5 mg total) by mouth daily as needed for severe pain.   Marland Kitchen oxyCODONE ER 13.5 MG C12A Take 13.5 mg by mouth daily as needed.    No facility-administered encounter medications on file as of 05/27/2020.    Surgical History: Past Surgical History:  Procedure Laterality Date  . ABDOMINAL HYSTERECTOMY    . ANKLE SURGERY Left   . APPENDECTOMY    . CAROTID PTA/STENT INTERVENTION N/A 07/17/2019   Procedure: CAROTID PTA/STENT INTERVENTION;  Surgeon: Algernon Huxley, MD;  Location: [place] [facility];  Service: Cardiovascular;  Laterality: N/A;  . CAROTID-SUBCLAVIAN BYPASS GRAFT N/A 07/26/2019   Procedure: BYPASS GRAFT CAROTID-SUBCLAVIAN ( CAROTID TO CAROTID BYPASS);  Surgeon: Algernon Huxley, MD;  Location: [facility] ORS;  Service: Vascular;  Laterality: N/A;  . CHOLECYSTECTOMY    . COLONOSCOPY WITH PROPOFOL N/A 12/04/2014   Procedure: COLONOSCOPY WITH PROPOFOL;  Surgeon: Lollie Sails, MD;  Location: [facility] ENDOSCOPY;  Service: Endoscopy;  Laterality: N/A;  . FRACTURE SURGERY     Femur Fx; LT Ankle Pinning  . JOINT REPLACEMENT     RT TKR  . Patial Thyroid Resection      Medical History: Past Medical History:  Diagnosis Date  . Arthritis   . Asthma   . Atrial fibrillation ([place])    per patient's daughter   . COPD (chronic obstructive pulmonary disease) ([place])   . Diverticulitis   . Gout   . Hyperlipidemia   . Hypertension   . Hypothyroidism   . 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 Microhematuria   . Mild mitral regurgitation   . Mild tricuspid regurgitation   . Osteopenia   . Pulmonary fibrosis ([place])   . Urinary incontinence     Family History: Family  History  Problem Relation Age of Onset  . Heart Problems Mother   . Heart Problems Father   . Diabetes Father   . Lupus Sister   . Thyroid disease Sister   . Heart Problems Sister   . Hypertension Son   . Diabetes Son        borderline   . Arthritis Daughter        in the left knee   . Diabetes Daughter   . Neuropathy Daughter   . Depression Daughter   . Anxiety disorder Daughter     Social History   Socioeconomic History  . Marital status: Married  Spouse name: Jeneen Rinks  . Number of children: Not on file  . Years of education: Not on file  . Highest education level: Not on file  Occupational History  . Not on file  Tobacco Use  . Smoking status: Former Smoker    Years: 1.00    Types: Cigarettes  . Smokeless tobacco: Never Used  Vaping Use  . Vaping Use: Never used  Substance and Sexual Activity  . Alcohol use: No  . Drug use: No  . Sexual activity: Not Currently  Other Topics Concern  . Not on file  Social History Narrative   Lives at home with husband in private residence   Social Determinants of Health   Financial Resource Strain: Not on file  Food Insecurity: Not on file  Transportation Needs: Not on file  Physical Activity: Not on file  Stress: Not on file  Social Connections: Not on file  Intimate Partner Violence: Not on file      Review of Systems  Constitutional: Positive for activity change and fatigue. Negative for chills and unexpected weight change.       Has been having trouble standing to prepare meals, medication management, and managing finances and bills.   HENT: Negative for congestion, postnasal drip, rhinorrhea, sneezing and sore throat.   Respiratory: Negative for cough, chest tightness, shortness of breath and wheezing.   Cardiovascular: Negative for chest pain and palpitations.       Stable. Blood pressure.   Gastrointestinal: Negative for abdominal pain, constipation, diarrhea, nausea and vomiting.  Endocrine: Negative for cold  intolerance, heat intolerance, polydipsia and polyuria.  Musculoskeletal: Positive for arthralgias, back pain and myalgias. Negative for joint swelling and neck pain.       Continues to have severe lower back pain. Has done well with increased dosing of oxycodone. This has improved her low back pain some.  Skin: Negative for rash.  Allergic/Immunologic: Negative for environmental allergies.  Neurological: Positive for headaches. Negative for dizziness, tremors and numbness.  Hematological: Negative for adenopathy. Does not bruise/bleed easily.  Psychiatric/Behavioral: Negative for behavioral problems (Depression), sleep disturbance and suicidal ideas. The patient is not nervous/anxious.     Today's Vitals   05/27/20 1203  BP: 132/80  Pulse: (!) 129  Resp: 16  Temp: (!) 97.3 F (36.3 C)  SpO2: 97%  Weight: 202 lb 9.6 oz (91.9 kg)  Height: 5\' 3"  (1.6 m)   Body mass index is 35.89 kg/m.  Physical Exam Vitals and nursing note reviewed.  Constitutional:      General: She is not in acute distress.    Appearance: Normal appearance. She is well-developed and well-nourished. She is not diaphoretic.  HENT:     Head: Normocephalic and atraumatic.     Nose: Nose normal.     Mouth/Throat:     Mouth: Oropharynx is clear and moist.     Pharynx: No oropharyngeal exudate.  Eyes:     Extraocular Movements: EOM normal.     Pupils: Pupils are equal, round, and reactive to light.  Neck:     Thyroid: No thyromegaly.     Vascular: No JVD.     Trachea: No tracheal deviation.  Cardiovascular:     Rate and Rhythm: Normal rate and regular rhythm.     Heart sounds: Normal heart sounds. No murmur heard. No friction rub. No gallop.      Comments: There is swelling of bilateral lower extremities.  Pulmonary:     Effort: Pulmonary effort is normal. No  respiratory distress.     Breath sounds: Normal breath sounds. No wheezing or rales.  Chest:     Chest wall: No tenderness.  Abdominal:      Palpations: Abdomen is soft.  Musculoskeletal:        General: Normal range of motion.     Cervical back: Normal range of motion and neck supple.     Comments:  Having severe low back pain, worse with bending and twisting at the waist. Worse with reaching, pushing, pulling, and lifting with the arms.  Increased pain medication dosing has improved overall pain management.   Lymphadenopathy:     Cervical: No cervical adenopathy.  Skin:    General: Skin is warm and dry.  Neurological:     General: No focal deficit present.     Mental Status: She is alert and oriented to person, place, and time.     Cranial Nerves: No cranial nerve deficit.  Psychiatric:        Mood and Affect: Mood and affect and mood normal.        Behavior: Behavior normal.        Thought Content: Thought content normal.        Judgment: Judgment normal.    Assessment/Plan: 1. Essential hypertension Blood pressure has stabilized. Continue bp medication as prescribed.   2. Inflammatory polyarthritis (HCC) Trial of oxycodone ER 13.5mg  daily. Oxycodone 5mg  daily in the afternoon if needed for severe pain. Single 30 day prescriptions were sent to her pharmacy for both today. Follow in one month for further evaluation.  - oxyCODONE (OXY IR/ROXICODONE) 5 MG immediate release tablet; Take 1 tablet (5 mg total) by mouth daily as needed for severe pain.  Dispense: 30 tablet; Refill: 0 - oxyCODONE ER 13.5 MG C12A; Take 13.5 mg by mouth daily as needed.  Dispense: 30 capsule; Refill: 0  3. [name] (nonalcoholic steatohepatitis) Continue regular visits with GI specialist as scheduled.  4. Current moderate episode of major depressive disorder, unspecified whether recurrent ([place]) Well managed with low dose remeron every evening.   5. S/P carotid endarterectomy Patient on eliquis and atorvastatin daily as prescribed. Regular visits with vein and vascular as scheduled.   General Counseling: mikeisha lemonds understanding of the  findings of todays visit and agrees with plan of treatment. I have discussed any further diagnostic evaluation that may be needed or ordered today. We also reviewed her medications today. she has been encouraged to call the office with any questions or concerns that should arise related to todays visit.  Reviewed risks and possible side effects associated with taking opiates, benzodiazepines and other CNS depressants. Combination of these could cause dizziness and drowsiness. Advised patient not to drive or operate machinery when taking these medications, as patient's and other's life can be at risk and will have consequences. Patient verbalized understanding in this matter. Dependence and abuse for these drugs will be monitored closely. A Controlled substance policy and procedure is on file which allows [facility] to order a urine drug screen test at any visit. Patient understands and agrees with the plan   This patient was seen by [name] FNP Collaboration with Dr [name] as a part of collaborative care agreement  Meds ordered this encounter  Medications  . oxyCODONE (OXY IR/ROXICODONE) 5 MG immediate release tablet    Sig: Take 1 tablet (5 mg total) by mouth daily as needed for severe pain.    Dispense:  30 tablet    Refill:  0    Order Specific Question:   Supervising Provider    Answer:   [name] [[id number]]  . oxyCODONE ER 13.5 MG C12A    Sig: Take 13.5 mg by mouth daily as needed.    Dispense:  30 capsule    Refill:  0    Will do single C12A in am and single 5mg  oxycodone in afternoon/evening as needed.    Order Specific Question:   Supervising Provider    Answer:   [name] [[id number]]    Total time spent: 30 Minutes    Time spent includes review of chart, medications, test results, and follow up plan with the patient.      Dr [name] Internal medicine

## 2020-06-10 DIAGNOSIS — H26493 Other secondary cataract, bilateral: Secondary | ICD-10-CM | POA: Diagnosis not present

## 2020-06-13 ENCOUNTER — Telehealth: Payer: Self-pay

## 2020-06-13 ENCOUNTER — Other Ambulatory Visit: Payer: Self-pay | Admitting: Nurse Practitioner

## 2020-06-13 DIAGNOSIS — J0140 Acute pansinusitis, unspecified: Secondary | ICD-10-CM

## 2020-06-13 MED ORDER — AMOXICILLIN 875 MG PO TABS: 875.0000 mg | ORAL_TABLET | Freq: Two times a day (BID) | ORAL | 0 refills | Status: DC

## 2020-06-13 MED ORDER — FLUTICASONE PROPIONATE 50 MCG/ACT NA SUSP: 2.0000 | Freq: Every day | NASAL | 6 refills | Status: DC

## 2020-06-13 NOTE — Telephone Encounter (Signed)
Started amoxicillin 875mg  twice daily for ten days. Also added flonase nasal spray. Use two sprays in both nostrils daily. Continue to use OTC medications as needed and as indicated for symptom relief. If persistent, she should consider testing for COVID with numbers surging. Thanks.

## 2020-06-18 ENCOUNTER — Telehealth: Payer: Self-pay

## 2020-06-18 NOTE — Telephone Encounter (Signed)
Wonderful. Thank you for checking on her.

## 2020-06-26 DIAGNOSIS — H26491 Other secondary cataract, right eye: Secondary | ICD-10-CM | POA: Diagnosis not present

## 2020-06-27 ENCOUNTER — Telehealth: Payer: Self-pay

## 2020-06-27 ENCOUNTER — Encounter: Payer: Self-pay | Admitting: Internal Medicine

## 2020-06-27 ENCOUNTER — Other Ambulatory Visit: Payer: Self-pay

## 2020-06-27 ENCOUNTER — Ambulatory Visit (INDEPENDENT_AMBULATORY_CARE_PROVIDER_SITE_OTHER): Payer: Medicare HMO | Admitting: Internal Medicine

## 2020-06-27 VITALS — BP 118/70 | HR 90 | Temp 98.0°F | Resp 16 | Ht 63.0 in | Wt 203.0 lb

## 2020-06-27 DIAGNOSIS — G894 Chronic pain syndrome: Secondary | ICD-10-CM | POA: Insufficient documentation

## 2020-06-27 DIAGNOSIS — E039 Hypothyroidism, unspecified: Secondary | ICD-10-CM | POA: Diagnosis not present

## 2020-06-27 DIAGNOSIS — N183 Chronic kidney disease, stage 3 unspecified: Secondary | ICD-10-CM

## 2020-06-27 DIAGNOSIS — R7309 Other abnormal glucose: Secondary | ICD-10-CM

## 2020-06-27 DIAGNOSIS — M158 Other polyosteoarthritis: Secondary | ICD-10-CM

## 2020-06-27 DIAGNOSIS — I10 Essential (primary) hypertension: Secondary | ICD-10-CM | POA: Diagnosis not present

## 2020-06-27 DIAGNOSIS — L03116 Cellulitis of left lower limb: Secondary | ICD-10-CM | POA: Diagnosis not present

## 2020-06-27 LAB — POCT GLYCOSYLATED HEMOGLOBIN (HGB A1C): Hemoglobin A1C: 5.8 % — AB (ref 4.0–5.6)

## 2020-06-27 MED ORDER — OXYCODONE HCL 5 MG PO TABS: 5.0000 mg | ORAL_TABLET | Freq: Every day | ORAL | 0 refills | Status: DC | PRN

## 2020-06-27 MED ORDER — AMOXICILLIN-POT CLAVULANATE 875-125 MG PO TABS: 1.0000 | ORAL_TABLET | Freq: Two times a day (BID) | ORAL | 0 refills | Status: DC

## 2020-06-27 MED ORDER — OXYCODONE ER 13.5 MG PO C12A: 13.5000 mg | EXTENDED_RELEASE_CAPSULE | Freq: Every day | ORAL | 0 refills | Status: DC | PRN

## 2020-06-27 NOTE — Progress Notes (Signed)
[facility] [place], [place] [address]  Internal MEDICINE  Office Visit Note  Patient Name: Martha Peterson  [id number]  [id number]  Date of Service: 06/27/2020  Chief Complaint  Patient presents with  . Hypertension  . Hyperlipidemia  . COPD  . Hypothyroidism  . Leg Swelling    Redness in left leg    HPI Patient returns for routine follow up. Her sinus infection from several weeks ago has improved.  Reports left leg swelling and redness. This has been constant and is not progressing up leg. She reports this leg has 3 screws in it from previously surgery in 1994. She is elevating her legs and wearing knee high stockings. She thinks it improved some while she was taking the Amoxicillin from her sinus infection, but is still present. She follows up with cardiology next month for afib. She states her back pain is well controlled with the combination of Oxycodone ER in the morning with IR as needed in the afternoon. We also discussed that her renal function is still problematic and will obtain new labs. She will also continue to only take 1 tab of Allopurinol. Also discussed her glucose remains elevated and a POC A1c was revealed to be 5.8.  Patient would like to further discuss disability qualification.  Current Medication: Outpatient Encounter Medications as of 06/27/2020  Medication Sig Note  . allopurinol (ZYLOPRIM) 100 MG tablet Take 2 tablets (200 mg total) by mouth daily.   Marland Kitchen amoxicillin-clavulanate (AUGMENTIN) 875-125 MG tablet Take 1 tablet by mouth 2 (two) times daily.   Marland Kitchen apixaban (ELIQUIS) 5 MG TABS tablet Take 1 tablet (5 mg total) by mouth 2 (two) times daily.   Marland Kitchen ascorbic acid (VITAMIN C) 500 MG tablet Take 500 mg by mouth daily.   Marland Kitchen aspirin EC 81 MG tablet Take 81 mg by mouth daily.   Marland Kitchen atorvastatin (LIPITOR) 10 MG tablet Take 1 tablet (10 mg total) by mouth daily at 6 PM.   . carvedilol (COREG) 6.25 MG tablet Take 1 tablet (6.25 mg total)  by mouth 2 (two) times daily with a meal.   . cholecalciferol (VITAMIN D) 400 UNITS TABS tablet Take 1,000 Units by mouth.   . diclofenac sodium (VOLTAREN) 1 % GEL Apply 3 grams to 3 large joints, up to 3 times daily as needed.   . fluticasone (FLONASE) 50 MCG/ACT nasal spray Place 2 sprays into both nostrils daily.   . Ginger, Zingiber officinalis, (GINGER PO) Take by mouth.   Marland Kitchen ibuprofen (ADVIL) 600 MG tablet Take 1 tablet (600 mg total) by mouth every 8 (eight) hours as needed.   Marland Kitchen levothyroxine (SYNTHROID) 137 MCG tablet Take 1 tablet (137 mcg total) by mouth daily before breakfast.   . losartan-hydrochlorothiazide (HYZAAR) 100-25 MG tablet Take 1 tablet by mouth daily.   . magnesium oxide (MAG-OX) 400 MG tablet Take 400 mg by mouth daily.  08/20/2015: Received from: External Pharmacy  . mirtazapine (REMERON) 7.5 MG tablet Take 1 tablet (7.5 mg total) by mouth at bedtime.   . Misc Natural Products (TART CHERRY ADVANCED PO) Take by mouth daily.   . Multiple Vitamins-Minerals (PRESERVISION AREDS 2 PO) Take by mouth 2 (two) times daily.   Marland Kitchen omega-3 acid ethyl esters (LOVAZA) 1 G capsule Take 1 g by mouth 2 (two) times daily.   Marland Kitchen omeprazole (PRILOSEC) 20 MG capsule Take one capsule by mouth two times daily   . oxyCODONE (OXY IR/ROXICODONE) 5 MG immediate release tablet  Take 1 tablet (5 mg total) by mouth daily as needed for severe pain.   Marland Kitchen oxyCODONE ER 13.5 MG C12A Take 13.5 mg by mouth daily as needed.   . potassium chloride (KLOR-CON) 10 MEQ tablet Take 1 tablet (10 mEq total) by mouth daily.   . predniSONE (DELTASONE) 5 MG tablet Take 2 tablets (10 mg total) by mouth daily with breakfast.   . tiZANidine (ZANAFLEX) 4 MG tablet Take 1 tablet (4 mg total) by mouth 2 (two) times daily.   Marland Kitchen torsemide (DEMADEX) 20 MG tablet Take 1 tablet (20 mg total) by mouth daily.   . TURMERIC PO Take by mouth daily.   . vitamin E 400 UNIT capsule Take 800 Units by mouth daily.  08/20/2015: Received from: External  Pharmacy  . [DISCONTINUED] amoxicillin (AMOXIL) 875 MG tablet Take 1 tablet (875 mg total) by mouth 2 (two) times daily.    No facility-administered encounter medications on file as of 06/27/2020.    Surgical History: Past Surgical History:  Procedure Laterality Date  . ABDOMINAL HYSTERECTOMY    . ANKLE SURGERY Left   . APPENDECTOMY    . CAROTID PTA/STENT INTERVENTION N/A 07/17/2019   Procedure: CAROTID PTA/STENT INTERVENTION;  Surgeon: Algernon Huxley, MD;  Location: [facility];  Service: Cardiovascular;  Laterality: N/A;  . CAROTID-SUBCLAVIAN BYPASS GRAFT N/A 07/26/2019   Procedure: BYPASS GRAFT CAROTID-SUBCLAVIAN ( CAROTID TO CAROTID BYPASS);  Surgeon: Algernon Huxley, MD;  Location: [facility];  Service: Vascular;  Laterality: N/A;  . CHOLECYSTECTOMY    . COLONOSCOPY WITH PROPOFOL N/A 12/04/2014   Procedure: COLONOSCOPY WITH PROPOFOL;  Surgeon: Lollie Sails, MD;  Location: [facility] ENDOSCOPY;  Service: Endoscopy;  Laterality: N/A;  . FRACTURE SURGERY     Femur Fx; LT Ankle Pinning  . JOINT REPLACEMENT     RT TKR  . Patial Thyroid Resection      Medical History: Past Medical History:  Diagnosis Date  . Arthritis   . Asthma   . Atrial fibrillation ([place])    per patient's daughter   . COPD (chronic obstructive pulmonary disease) ([place])   . Diverticulitis   . Gout   . Hyperlipidemia   . Hypertension   . Hypothyroidism   . 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 Microhematuria   . Mild mitral regurgitation   . Mild tricuspid regurgitation   . Osteopenia   . Pulmonary fibrosis ([place])   . Urinary incontinence     Family History: Family History  Problem Relation Age of Onset  . Heart Problems Mother   . Heart Problems Father   . Diabetes Father   . Lupus Sister   . Thyroid disease Sister   . Heart Problems Sister   . Hypertension Son   . Diabetes Son        borderline   . Arthritis Daughter        in the left knee   . Diabetes Daughter    . Neuropathy Daughter   . Depression Daughter   . Anxiety disorder Daughter     Social History   Socioeconomic History  . Marital status: Married    Spouse name: Jeneen Rinks  . Number of children: Not on file  . Years of education: Not on file  . Highest education level: Not on file  Occupational History  . Not on file  Tobacco Use  . Smoking status: Former Smoker    Years: 1.00    Types: Cigarettes  .  Smokeless tobacco: Never Used  Vaping Use  . Vaping Use: Never used  Substance and Sexual Activity  . Alcohol use: No  . Drug use: No  . Sexual activity: Not Currently  Other Topics Concern  . Not on file  Social History Narrative   Lives at home with husband in private residence   Social Determinants of Health   Financial Resource Strain: Not on file  Food Insecurity: Not on file  Transportation Needs: Not on file  Physical Activity: Not on file  Stress: Not on file  Social Connections: Not on file  Intimate Partner Violence: Not on file      Review of Systems  Constitutional: Negative for chills, fatigue, fever and unexpected weight change.  HENT: Positive for postnasal drip. Negative for congestion, rhinorrhea, sneezing and sore throat.   Eyes: Negative for redness.  Respiratory: Negative for cough, chest tightness and shortness of breath.   Cardiovascular: Negative for chest pain and palpitations.  Gastrointestinal: Negative for abdominal pain, constipation, diarrhea, nausea and vomiting.  Genitourinary: Negative for dysuria and frequency.  Musculoskeletal: Positive for back pain. Negative for arthralgias, joint swelling and neck pain.  Skin: Positive for color change. Negative for rash.       Left leg redness and swelling  Neurological: Negative.  Negative for tremors and numbness.  Hematological: Negative for adenopathy. Does not bruise/bleed easily.  Psychiatric/Behavioral: Negative for behavioral problems (Depression), sleep disturbance and suicidal ideas.  The patient is not nervous/anxious.     Vital Signs: BP 118/70   Pulse 90   Temp 98 F (36.7 C)   Resp 16   Ht 5\' 3"  (1.6 m)   Wt 203 lb (92.1 kg)   SpO2 98%   BMI 35.96 kg/m    Physical Exam Constitutional:      General: She is not in acute distress.    Appearance: She is well-developed and well-nourished. She is not diaphoretic.  HENT:     Head: Normocephalic and atraumatic.     Nose: Nose normal.     Mouth/Throat:     Mouth: Oropharynx is clear and moist.     Pharynx: No oropharyngeal exudate.  Eyes:     Extraocular Movements: Extraocular movements intact and EOM normal.     Pupils: Pupils are equal, round, and reactive to light.  Neck:     Thyroid: No thyromegaly.     Vascular: No JVD.     Trachea: No tracheal deviation.  Cardiovascular:     Rate and Rhythm: Normal rate. Rhythm irregular.     Heart sounds: Normal heart sounds. No murmur heard. No friction rub. No gallop.   Pulmonary:     Effort: Pulmonary effort is normal. No respiratory distress.     Breath sounds: No wheezing or rales.  Chest:     Chest wall: No tenderness.  Abdominal:     General: Bowel sounds are normal.     Palpations: Abdomen is soft.  Musculoskeletal:        General: Swelling present. Normal range of motion.     Cervical back: Normal range of motion and neck supple.  Lymphadenopathy:     Cervical: No cervical adenopathy.  Skin:    General: Skin is warm and dry.     Findings: Erythema present.     Comments: Erythema, warmth, and swelling on lower left leg from mid-calf down  Neurological:     Mental Status: She is alert and oriented to person, place, and time.  Cranial Nerves: No cranial nerve deficit.  Psychiatric:        Mood and Affect: Mood and affect normal.        Behavior: Behavior normal.        Thought Content: Thought content normal.        Judgment: Judgment normal.        Assessment/Plan: 1. Cellulitis of left lower extremity Warmth, erythema, and swelling  of LLE, mid calf and below. Start the following antibiotic and follow up in 2 weeks. - amoxicillin-clavulanate (AUGMENTIN) 875-125 MG tablet; Take 1 tablet by mouth 2 (two) times daily.  Dispense: 20 tablet; Refill: 0  2. Elevated glucose Several elevated glucose levels, so a POC A1c drawn was 5.8. Discussed this with her and will continue to monitor. - POCT glycosylated hemoglobin (Hb A1C)  3. Stage 3 chronic kidney disease, unspecified whether stage 3a or 3b CKD (HCC) Elevated renal function test. Will repeat lab work and consider [place] next visit. - Basic Metabolic Panel (BMET)  4. Chronic pain syndrome Managing well on Oxycodone ER and IR. Continue current medications.  5. Essential hypertension BP was great today. Continue current medications.  6. Other osteoarthritis involving multiple joints Severe pain is well managed on the following medications and she should continue to use as needed. Reviewed risks and possible side effects associated with taking opiates, benzodiazepines and other CNS depressants. Combination of these could cause dizziness and drowsiness. Advised patient not to drive or operate machinery when taking these medications, as patient's and other's life can be at risk and will have consequences. Patient verbalized understanding in this matter. Dependence and abuse for these drugs will be monitored closely. A Controlled substance policy and procedure is on file which allows [facility] to order a urine drug screen test at any visit. Patient understands and agrees with the plan - oxyCODONE ER 13.5 MG C12A; Take 13.5 mg by mouth daily as needed.  Dispense: 30 capsule; Refill: 0 - oxyCODONE (OXY IR/ROXICODONE) 5 MG immediate release tablet; Take 1 tablet (5 mg total) by mouth daily as needed for severe pain.  Dispense: 30 tablet; Refill: 0  7. Hypothyroidism (acquired) Continue current medications.    General Counseling: Martha Peterson  understanding of the findings of todays visit and agrees with plan of treatment. I have discussed any further diagnostic evaluation that may be needed or ordered today. We also reviewed her medications today. she has been encouraged to call the office with any questions or concerns that should arise related to todays visit.   Orders Placed This Encounter  Procedures  . Basic Metabolic Panel (BMET)  . POCT glycosylated hemoglobin (Hb A1C)    Meds ordered this encounter  Medications  . amoxicillin-clavulanate (AUGMENTIN) 875-125 MG tablet    Sig: Take 1 tablet by mouth 2 (two) times daily.    Dispense:  20 tablet    Refill:  0    Total time spent: 30 Minutes Time spent includes review of chart, medications, test results, and follow up plan with the patient.      Dr [name] Internal medicine

## 2020-06-27 NOTE — Telephone Encounter (Signed)
Spoke with pt, she will be dropping of paperwork for Dr. Clayborn Bigness to fill out.

## 2020-07-05 ENCOUNTER — Other Ambulatory Visit: Payer: Self-pay

## 2020-07-10 DIAGNOSIS — N183 Chronic kidney disease, stage 3 unspecified: Secondary | ICD-10-CM | POA: Diagnosis not present

## 2020-07-11 ENCOUNTER — Ambulatory Visit (INDEPENDENT_AMBULATORY_CARE_PROVIDER_SITE_OTHER): Payer: Medicare HMO | Admitting: Physician Assistant

## 2020-07-11 ENCOUNTER — Encounter: Payer: Self-pay | Admitting: Physician Assistant

## 2020-07-11 DIAGNOSIS — I890 Lymphedema, not elsewhere classified: Secondary | ICD-10-CM | POA: Insufficient documentation

## 2020-07-11 DIAGNOSIS — M1A9XX0 Chronic gout, unspecified, without tophus (tophi): Secondary | ICD-10-CM | POA: Diagnosis not present

## 2020-07-11 DIAGNOSIS — I10 Essential (primary) hypertension: Secondary | ICD-10-CM | POA: Diagnosis not present

## 2020-07-11 DIAGNOSIS — N183 Chronic kidney disease, stage 3 unspecified: Secondary | ICD-10-CM

## 2020-07-11 LAB — BASIC METABOLIC PANEL
BUN/Creatinine Ratio: 25 (ref 12–28)
BUN: 29 mg/dL — ABNORMAL HIGH (ref 8–27)
CO2: 28 mmol/L (ref 20–29)
Calcium: 9.7 mg/dL (ref 8.7–10.3)
Chloride: 99 mmol/L (ref 96–106)
Creatinine, Ser: 1.17 mg/dL — ABNORMAL HIGH (ref 0.57–1.00)
GFR calc Af Amer: 50 mL/min/{1.73_m2} — ABNORMAL LOW (ref >59)
GFR calc non Af Amer: 44 mL/min/{1.73_m2} — ABNORMAL LOW (ref >59)
Glucose: 95 mg/dL (ref 65–99)
Potassium: 4.8 mmol/L (ref 3.5–5.2)
Sodium: 142 mmol/L (ref 134–144)
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
## 2020-07-22 ENCOUNTER — Other Ambulatory Visit: Payer: Self-pay

## 2020-07-22 DIAGNOSIS — M064 Inflammatory polyarthropathy: Secondary | ICD-10-CM

## 2020-07-22 DIAGNOSIS — M1612 Unilateral primary osteoarthritis, left hip: Secondary | ICD-10-CM

## 2020-07-22 MED ORDER — IBUPROFEN 600 MG PO TABS: 600.0000 mg | ORAL_TABLET | Freq: Three times a day (TID) | ORAL | 1 refills | Status: DC | PRN

## 2020-07-24 ENCOUNTER — Telehealth: Payer: Self-pay

## 2020-07-25 ENCOUNTER — Other Ambulatory Visit: Payer: Self-pay | Admitting: Physician Assistant

## 2020-07-25 DIAGNOSIS — M158 Other polyosteoarthritis: Secondary | ICD-10-CM

## 2020-07-25 MED ORDER — OXYCODONE ER 13.5 MG PO C12A: 13.5000 mg | EXTENDED_RELEASE_CAPSULE | Freq: Every day | ORAL | 0 refills | Status: DC | PRN

## 2020-07-25 MED ORDER — OXYCODONE HCL 5 MG PO TABS: 5.0000 mg | ORAL_TABLET | Freq: Every day | ORAL | 0 refills | Status: DC | PRN

## 2020-07-26 NOTE — Telephone Encounter (Signed)
Sent to pharmacy 

## 2020-07-29 IMAGING — US US ABDOMEN COMPLETE
1 series · 14 of 25 positions shown · non-contrast
Comparison: 06/02/2017

CLINICAL DATA: Cirrhosis.

EXAM:
ABDOMEN ULTRASOUND COMPLETE

[Series 1: us abdomen complete · 0.25mm/px · 14 of 90 slices shown]
[im 1/90]
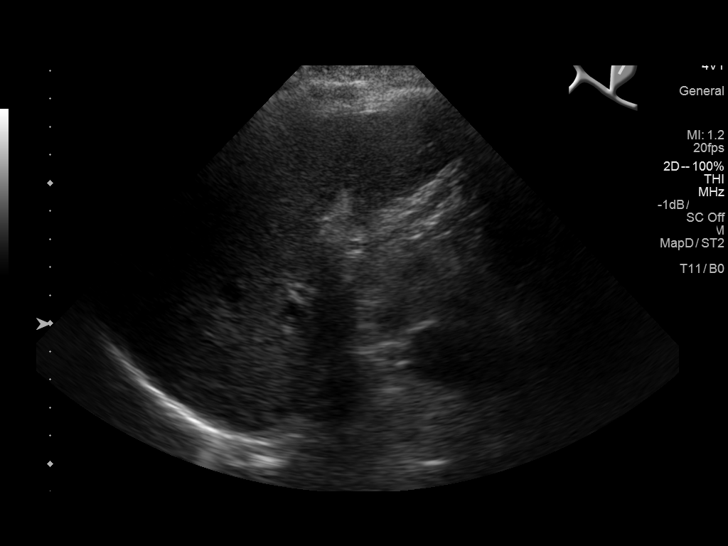
[im 8/90]
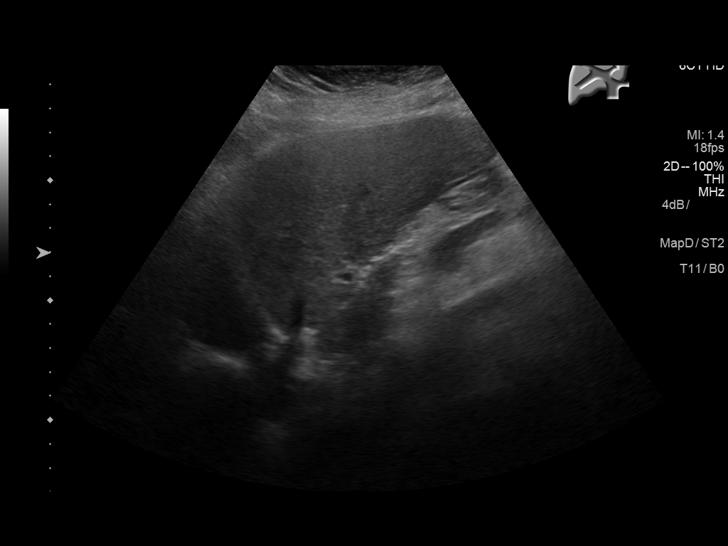
[im 15/90]
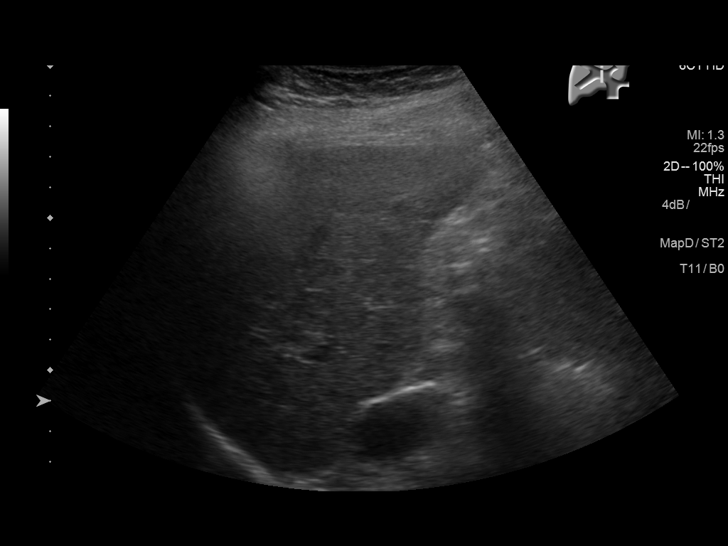
[im 23/90]
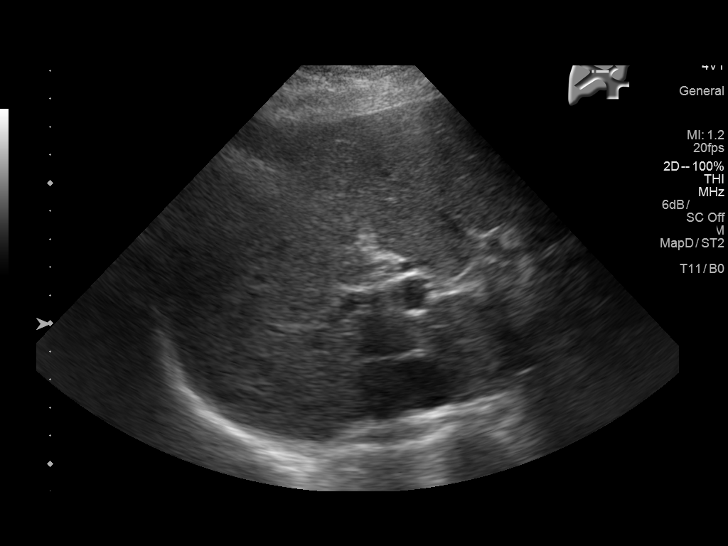
[im 30/90]
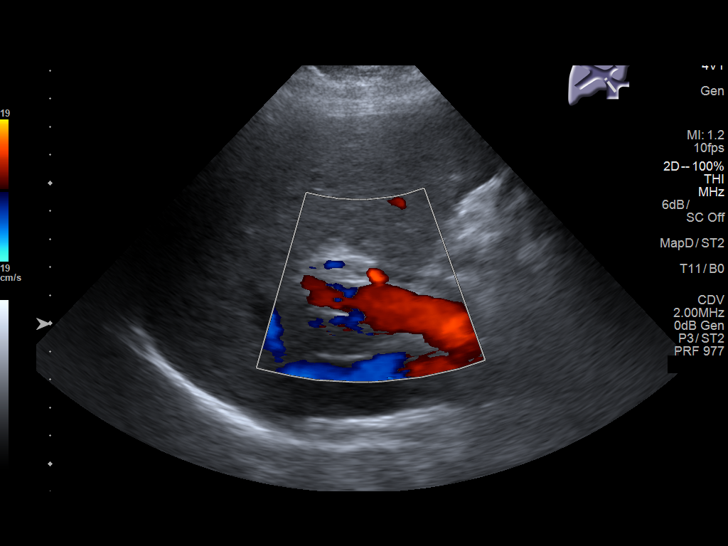
[im 34/90]
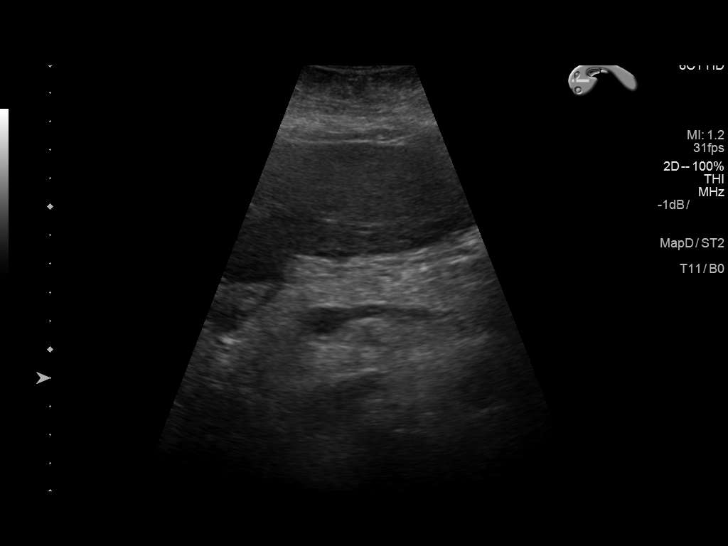
[im 41/90]
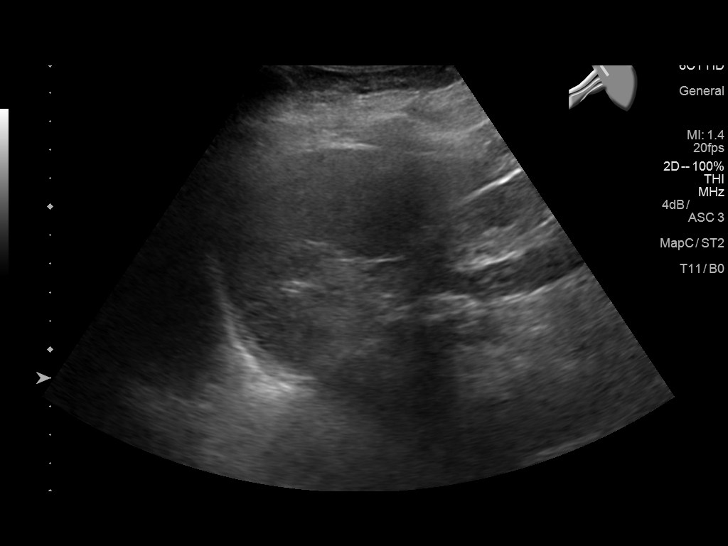
[im 49/90]
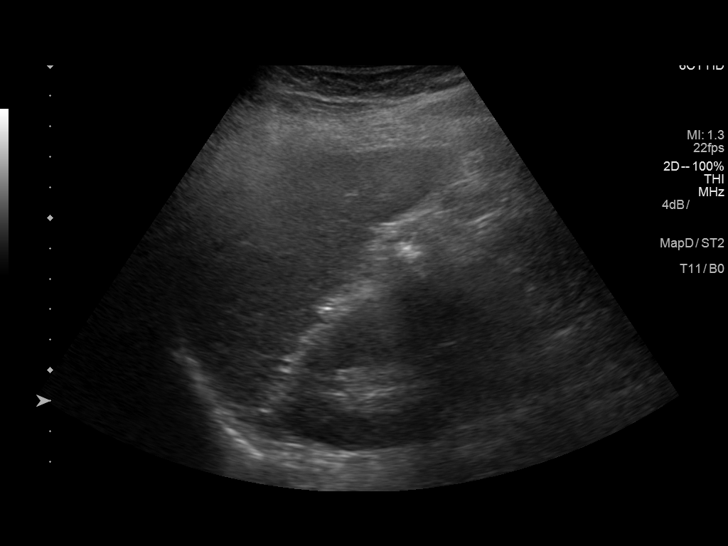
[im 56/90]
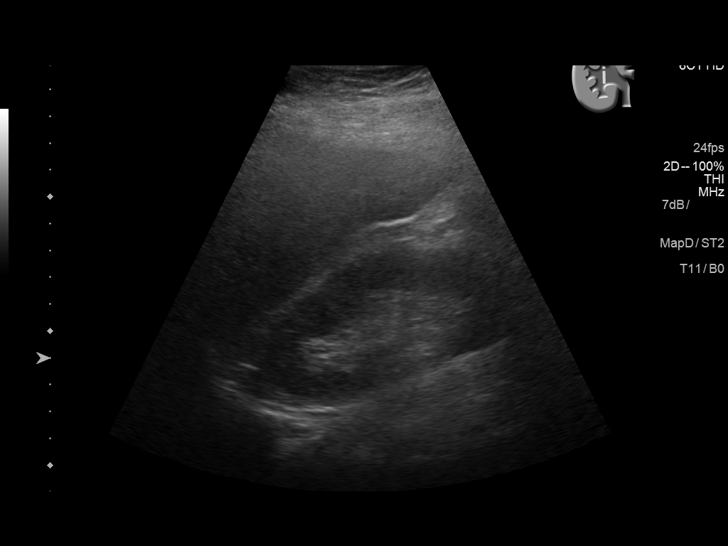
[im 60/90]
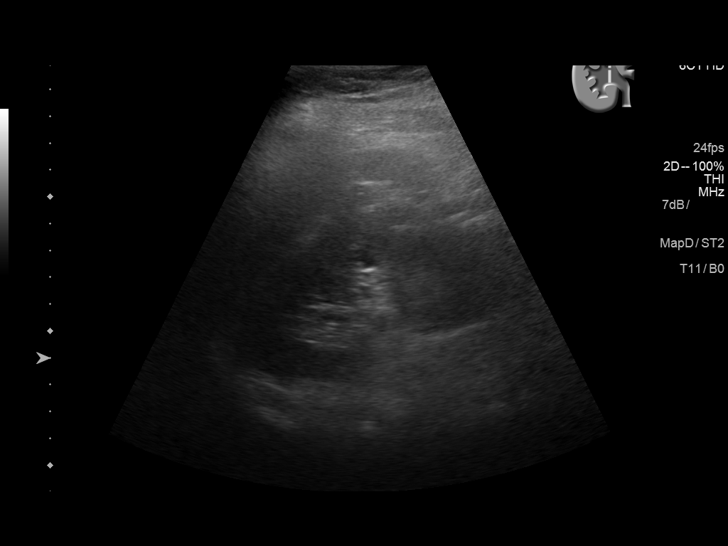
[im 67/90]
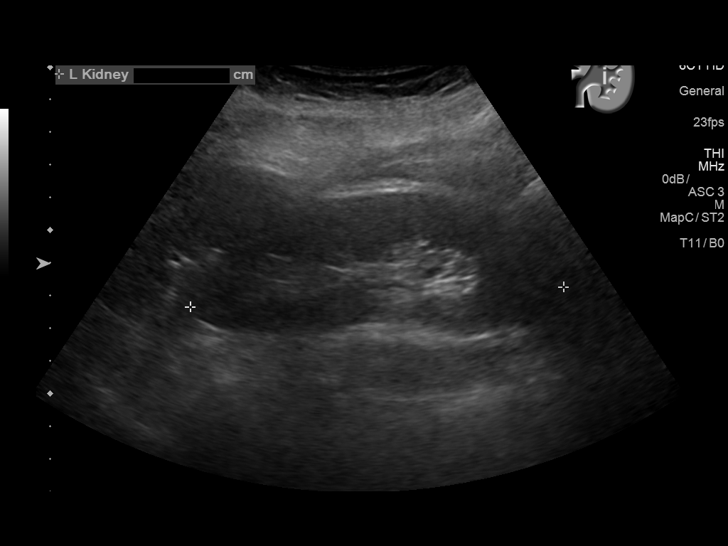
[im 75/90]
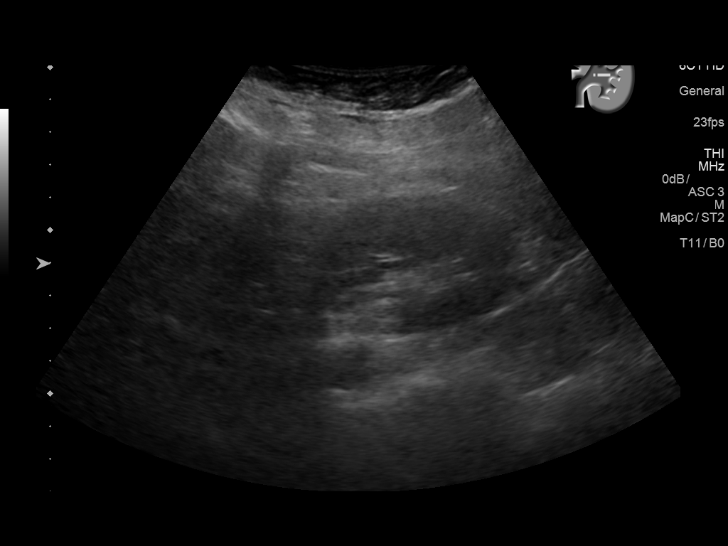
[im 82/90]
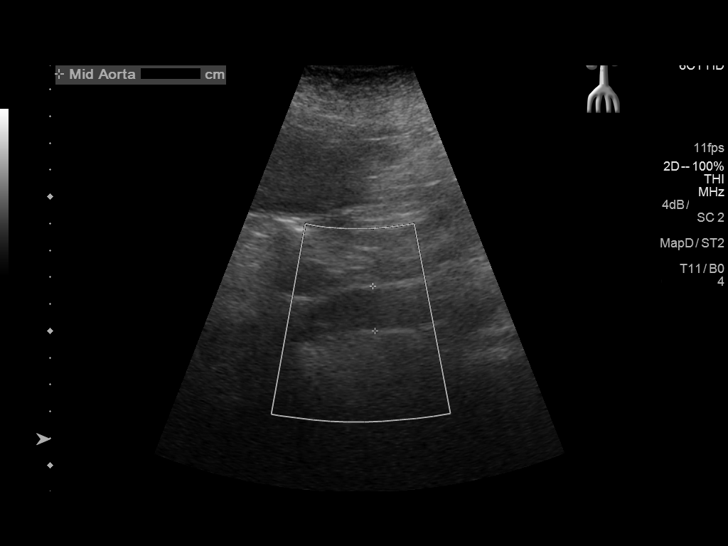
[im 90/90]
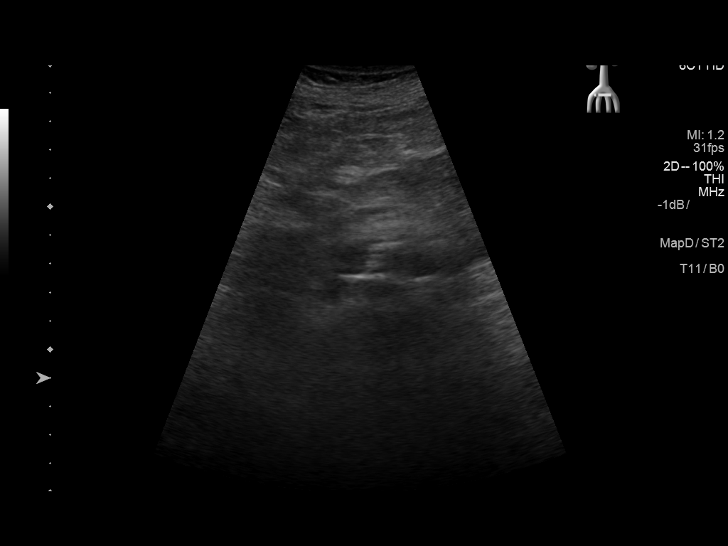

[14 of 25 positions shown; findings below may reference images not displayed]

FINDINGS: Gallbladder: Surgically absent.

Common bile duct: Diameter: 5.2 mm

Liver: Irregular liver contour and coarse echogenicity consistent
with known cirrhosis. No focal hepatic lesions are identified. No
intrahepatic biliary dilatation. Portal vein is patent on color
Doppler imaging with normal direction of blood flow towards the
liver.

IVC: Normal caliber.

Pancreas: Sonographically unremarkable.

Spleen: Normal size.  No focal lesions.

Right Kidney: Length: 10.2 cm. Normal renal cortical thickness and
echogenicity without focal lesions or hydronephrosis.

Left Kidney: Length: 11.4 cm. Normal renal cortical thickness and
echogenicity without focal lesions or hydronephrosis.

Abdominal aorta: Normal caliber.

Other findings: No ascites.
IMPRESSION: 1. Stable cirrhotic changes involving the liver but no focal hepatic
lesions or biliary dilatation.
2. Status post cholecystectomy.  No common bile duct dilatation.
3. Normal sonographic appearance of the spleen, pancreas and both
kidneys.

## 2020-08-02 ENCOUNTER — Other Ambulatory Visit: Payer: Self-pay

## 2020-08-02 MED ORDER — CARVEDILOL 6.25 MG PO TABS: 6.2500 mg | ORAL_TABLET | Freq: Two times a day (BID) | ORAL | 5 refills | Status: DC

## 2020-08-09 ENCOUNTER — Ambulatory Visit: Payer: Medicare HMO | Admitting: Hospice and Palliative Medicine

## 2020-08-13 ENCOUNTER — Other Ambulatory Visit: Payer: Self-pay

## 2020-08-13 ENCOUNTER — Encounter: Payer: Self-pay | Admitting: Hospice and Palliative Medicine

## 2020-08-13 ENCOUNTER — Ambulatory Visit (INDEPENDENT_AMBULATORY_CARE_PROVIDER_SITE_OTHER): Payer: Medicare HMO | Admitting: Hospice and Palliative Medicine

## 2020-08-13 VITALS — BP 140/87 | HR 100 | Temp 97.3°F | Resp 16 | Ht 63.0 in | Wt 198.4 lb

## 2020-08-13 DIAGNOSIS — I890 Lymphedema, not elsewhere classified: Secondary | ICD-10-CM

## 2020-08-13 DIAGNOSIS — F321 Major depressive disorder, single episode, moderate: Secondary | ICD-10-CM

## 2020-08-13 DIAGNOSIS — M158 Other polyosteoarthritis: Secondary | ICD-10-CM

## 2020-08-13 DIAGNOSIS — N183 Chronic kidney disease, stage 3 unspecified: Secondary | ICD-10-CM

## 2020-08-13 MED ORDER — OXYCODONE ER 13.5 MG PO C12A: 13.5000 mg | EXTENDED_RELEASE_CAPSULE | Freq: Two times a day (BID) | ORAL | 0 refills | Status: DC | PRN

## 2020-08-13 MED ORDER — MIRTAZAPINE 7.5 MG PO TABS
7.5000 mg | ORAL_TABLET | Freq: Every day | ORAL | 1 refills | Status: DC
Start: 2020-08-13 — End: 2020-09-25

## 2020-08-13 NOTE — Progress Notes (Signed)
[facility] [place], [place] [address]  Internal MEDICINE  Office Visit Note  Patient Name: Martha Peterson  [id number]  [id number]  Date of Service: 08/14/2020  Chief Complaint  Patient presents with  . Follow-up  . Hyperlipidemia  . Hypertension    HPI Patient is here for routine follow-up Concerned about her kidney function, has stopped Allopurinol therapy completely, has also stopped taking omeprazole as she read this medication could also harm her kidneys Since discontinuing has not had any GERD symptoms  LLE has continued to improve, swelling is back to baseline, has had chronic swelling since accident in 35's Continues to take Torsemide as needed--will take on days she is able to stay home but avoids taking medication when she has to leave the house as it causes urinary urgency and frequency Has decided that she does not want to be seen at lymphedema clinic--swelling has returned to baseline and at that level it does not bother her  Chronic back pain--control has become an issue Has taken oxycodone ER in the AM and oxycodone IR in the afternoons as needed Pain is not well controlled, by mid afternoon pain medication has worn off and has trouble sleeping due to the increased pain Wanting to try taking oxycodone ER twice daily instead of utilizing IR   Current Medication: Outpatient Encounter Medications as of 08/13/2020  Medication Sig Note  . apixaban (ELIQUIS) 5 MG TABS tablet Take 1 tablet (5 mg total) by mouth 2 (two) times daily.   Marland Kitchen ascorbic acid (VITAMIN C) 500 MG tablet Take 500 mg by mouth daily.   Marland Kitchen aspirin EC 81 MG tablet Take 81 mg by mouth daily.   Marland Kitchen atorvastatin (LIPITOR) 10 MG tablet Take 1 tablet (10 mg total) by mouth daily at 6 PM.   . carvedilol (COREG) 6.25 MG tablet Take 1 tablet (6.25 mg total) by mouth 2 (two) times daily with a meal.   . cholecalciferol (VITAMIN D) 400 UNITS TABS tablet Take 1,000 Units by mouth.   .  diclofenac sodium (VOLTAREN) 1 % GEL Apply 3 grams to 3 large joints, up to 3 times daily as needed.   . fluticasone (FLONASE) 50 MCG/ACT nasal spray Place 2 sprays into both nostrils daily.   . Ginger, Zingiber officinalis, (GINGER PO) Take by mouth.   Marland Kitchen ibuprofen (ADVIL) 600 MG tablet Take 1 tablet (600 mg total) by mouth every 8 (eight) hours as needed.   Marland Kitchen levothyroxine (SYNTHROID) 137 MCG tablet Take 1 tablet (137 mcg total) by mouth daily before breakfast.   . losartan-hydrochlorothiazide (HYZAAR) 100-25 MG tablet Take 1 tablet by mouth daily.   . magnesium oxide (MAG-OX) 400 MG tablet Take 400 mg by mouth daily.  08/20/2015: Received from: External Pharmacy  . Misc Natural Products (TART CHERRY ADVANCED PO) Take by mouth daily.   . Multiple Vitamins-Minerals (PRESERVISION AREDS 2 PO) Take by mouth 2 (two) times daily.   Marland Kitchen omega-3 acid ethyl esters (LOVAZA) 1 G capsule Take 1 g by mouth 2 (two) times daily.   . potassium chloride (KLOR-CON) 10 MEQ tablet Take 1 tablet (10 mEq total) by mouth daily.   . predniSONE (DELTASONE) 5 MG tablet Take 2 tablets (10 mg total) by mouth daily with breakfast.   . tiZANidine (ZANAFLEX) 4 MG tablet Take 1 tablet (4 mg total) by mouth 2 (two) times daily.   Marland Kitchen torsemide (DEMADEX) 20 MG tablet Take 1 tablet (20 mg total) by mouth daily.   Marland Kitchen  TURMERIC PO Take by mouth daily.   . vitamin E 400 UNIT capsule Take 800 Units by mouth daily.  08/20/2015: Received from: External Pharmacy  . [DISCONTINUED] allopurinol (ZYLOPRIM) 100 MG tablet Take 2 tablets (200 mg total) by mouth daily.   . [DISCONTINUED] mirtazapine (REMERON) 7.5 MG tablet Take 1 tablet (7.5 mg total) by mouth at bedtime.   . [DISCONTINUED] omeprazole (PRILOSEC) 20 MG capsule Take one capsule by mouth two times daily   . [DISCONTINUED] oxyCODONE (OXY IR/ROXICODONE) 5 MG immediate release tablet Take 1 tablet (5 mg total) by mouth daily as needed for severe pain.   . [DISCONTINUED] oxyCODONE ER 13.5 MG  C12A Take 13.5 mg by mouth daily as needed.   . mirtazapine (REMERON) 7.5 MG tablet Take 1 tablet (7.5 mg total) by mouth at bedtime.   Marland Kitchen oxyCODONE ER 13.5 MG C12A Take 13.5 mg by mouth 2 (two) times daily as needed.    No facility-administered encounter medications on file as of 08/13/2020.    Surgical History: Past Surgical History:  Procedure Laterality Date  . ABDOMINAL HYSTERECTOMY    . ANKLE SURGERY Left   . APPENDECTOMY    . CAROTID PTA/STENT INTERVENTION N/A 07/17/2019   Procedure: CAROTID PTA/STENT INTERVENTION;  Surgeon: Algernon Huxley, MD;  Location: [facility];  Service: Cardiovascular;  Laterality: N/A;  . CAROTID-SUBCLAVIAN BYPASS GRAFT N/A 07/26/2019   Procedure: BYPASS GRAFT CAROTID-SUBCLAVIAN ( CAROTID TO CAROTID BYPASS);  Surgeon: Algernon Huxley, MD;  Location: [facility];  Service: Vascular;  Laterality: N/A;  . CHOLECYSTECTOMY    . COLONOSCOPY WITH PROPOFOL N/A 12/04/2014   Procedure: COLONOSCOPY WITH PROPOFOL;  Surgeon: Lollie Sails, MD;  Location: [facility];  Service: Endoscopy;  Laterality: N/A;  . FRACTURE SURGERY     Femur Fx; LT Ankle Pinning  . JOINT REPLACEMENT     RT TKR  . Patial Thyroid Resection      Medical History: Past Medical History:  Diagnosis Date  . Arthritis   . Asthma   . Atrial fibrillation ([place])    per patient's daughter   . COPD (chronic obstructive pulmonary disease) ([place])   . Diverticulitis   . Gout   . Hyperlipidemia   . Hypertension   . Hypothyroidism   . 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 Microhematuria   . Mild mitral regurgitation   . Mild tricuspid regurgitation   . Osteopenia   . Pulmonary fibrosis ([place])   . Urinary incontinence     Family History: Family History  Problem Relation Age of Onset  . Heart Problems Mother   . Heart Problems Father   . Diabetes Father   . Lupus Sister   . Thyroid disease Sister   . Heart Problems Sister   . Hypertension Son   . Diabetes  Son        borderline   . Arthritis Daughter        in the left knee   . Diabetes Daughter   . Neuropathy Daughter   . Depression Daughter   . Anxiety disorder Daughter     Social History   Socioeconomic History  . Marital status: Married    Spouse name: Jeneen Rinks  . Number of children: Not on file  . Years of education: Not on file  . Highest education level: Not on file  Occupational History  . Not on file  Tobacco Use  . Smoking status: Former Smoker    Years: 1.00  Types: Cigarettes  . Smokeless tobacco: Never Used  Vaping Use  . Vaping Use: Never used  Substance and Sexual Activity  . Alcohol use: No  . Drug use: No  . Sexual activity: Not Currently  Other Topics Concern  . Not on file  Social History Narrative   Lives at home with husband in private residence   Social Determinants of Health   Financial Resource Strain: Not on file  Food Insecurity: Not on file  Transportation Needs: Not on file  Physical Activity: Not on file  Stress: Not on file  Social Connections: Not on file  Intimate Partner Violence: Not on file      Review of Systems  Constitutional: Negative for chills, diaphoresis and fatigue.  HENT: Negative for ear pain, postnasal drip and sinus pressure.   Eyes: Negative for photophobia, discharge, redness, itching and visual disturbance.  Respiratory: Negative for cough, shortness of breath and wheezing.   Cardiovascular: Negative for chest pain, palpitations and leg swelling.  Gastrointestinal: Negative for abdominal pain, constipation, diarrhea, nausea and vomiting.  Genitourinary: Negative for dysuria and flank pain.  Musculoskeletal: Positive for arthralgias and back pain. Negative for gait problem and neck pain.  Skin: Negative for color change.  Allergic/Immunologic: Negative for environmental allergies and food allergies.  Neurological: Negative for dizziness and headaches.  Hematological: Does not bruise/bleed easily.   Psychiatric/Behavioral: Positive for sleep disturbance. Negative for agitation, behavioral problems (depression) and hallucinations.    Vital Signs: BP 140/87   Pulse 100   Temp (!) 97.3 F (36.3 C)   Resp 16   Ht 5\' 3"  (1.6 m)   Wt 198 lb 6.4 oz (90 kg)   SpO2 98%   BMI 35.14 kg/m    Physical Exam Vitals reviewed.  Constitutional:      Appearance: Normal appearance. She is normal weight.  Cardiovascular:     Rate and Rhythm: Normal rate and regular rhythm.     Pulses: Normal pulses.     Heart sounds: Normal heart sounds.  Pulmonary:     Effort: Pulmonary effort is normal.     Breath sounds: Normal breath sounds.  Abdominal:     General: Abdomen is flat.     Palpations: Abdomen is soft.  Musculoskeletal:        General: Normal range of motion.     Cervical back: Normal range of motion.  Skin:    General: Skin is warm.  Neurological:     General: No focal deficit present.     Mental Status: She is alert and oriented to person, place, and time. Mental status is at baseline.  Psychiatric:        Mood and Affect: Mood normal.        Behavior: Behavior normal.        Thought Content: Thought content normal.        Judgment: Judgment normal.    Assessment/Plan: 1. Stage 3 chronic kidney disease, unspecified whether stage 3a or 3b CKD (HCC) Improvement in renal function, has since discontinued allopurinol as well as omeprazole Will repeat labs--advised to go within the next 2-3 weeks for repeat monitoring Normal renal exam on abdominal US--possibly consider renal US - Comprehensive Metabolic Panel (CMET)  2. Lymphedema Has returned to baseline, would like to hold off on lymphedema clinic at this time Advised to utilize compression stockings and to keep leg elevated as much as possible and avoid dependent positions  3. Other osteoarthritis involving multiple joints Discontinue IR, increase ER  BID Will need ONO if continues of BID dosing [place] Controlled Substance  Database was reviewed by me for overdose risk score (ORS) - oxyCODONE ER 13.5 MG C12A; Take 13.5 mg by mouth 2 (two) times daily as needed.  Dispense: 60 capsule; Refill: 0 Reviewed risks and possible side effects associated with taking opiates, benzodiazepines and other CNS depressants. Combination of these could cause dizziness and drowsiness. Advised patient not to drive or operate machinery when taking these medications, as patient's and other's life can be at risk and will have consequences. Patient verbalized understanding in this matter. Dependence and abuse for these drugs will be monitored closely. A Controlled substance policy and procedure is on file which allows [facility] to order a urine drug screen test at any visit. Patient understands and agrees with the plan  4. Current moderate episode of major depressive disorder, unspecified whether recurrent (HCC) Stable, requesting refills - mirtazapine (REMERON) 7.5 MG tablet; Take 1 tablet (7.5 mg total) by mouth at bedtime.  Dispense: 30 tablet; Refill: 1  General Counseling: Kynley verbalizes understanding of the findings of todays visit and agrees with plan of treatment. I have discussed any further diagnostic evaluation that may be needed or ordered today. We also reviewed her medications today. she has been encouraged to call the office with any questions or concerns that should arise related to todays visit.    Orders Placed This Encounter  Procedures  . Comprehensive Metabolic Panel (CMET)    Meds ordered this encounter  Medications  . oxyCODONE ER 13.5 MG C12A    Sig: Take 13.5 mg by mouth 2 (two) times daily as needed.    Dispense:  60 capsule    Refill:  0  . mirtazapine (REMERON) 7.5 MG tablet    Sig: Take 1 tablet (7.5 mg total) by mouth at bedtime.    Dispense:  30 tablet    Refill:  1    Time spent: 30 Minutes Time spent includes review of chart, medications, test results and follow-up plan with the  patient.  This patient was seen by Theodoro Grist AGNP-C in Collaboration with Dr [name] as a part of collaborative care agreement     Tanna Furry. Donnella Morford AGNP-C Internal medicine

## 2020-08-14 ENCOUNTER — Encounter: Payer: Self-pay | Admitting: Hospice and Palliative Medicine

## 2020-08-19 ENCOUNTER — Ambulatory Visit: Payer: Medicare HMO | Admitting: Cardiology

## 2020-08-19 ENCOUNTER — Encounter: Payer: Self-pay | Admitting: Cardiology

## 2020-08-19 ENCOUNTER — Other Ambulatory Visit: Payer: Self-pay

## 2020-08-19 VITALS — BP 120/60 | HR 101 | Ht 63.0 in | Wt 196.1 lb

## 2020-08-19 DIAGNOSIS — I6523 Occlusion and stenosis of bilateral carotid arteries: Secondary | ICD-10-CM | POA: Diagnosis not present

## 2020-08-19 DIAGNOSIS — I4891 Unspecified atrial fibrillation: Secondary | ICD-10-CM | POA: Diagnosis not present

## 2020-08-19 DIAGNOSIS — I5189 Other ill-defined heart diseases: Secondary | ICD-10-CM | POA: Diagnosis not present

## 2020-08-19 DIAGNOSIS — I10 Essential (primary) hypertension: Secondary | ICD-10-CM | POA: Diagnosis not present

## 2020-08-19 DIAGNOSIS — J8410 Pulmonary fibrosis, unspecified: Secondary | ICD-10-CM | POA: Diagnosis not present

## 2020-08-19 DIAGNOSIS — J449 Chronic obstructive pulmonary disease, unspecified: Secondary | ICD-10-CM | POA: Diagnosis not present

## 2020-08-19 NOTE — Progress Notes (Signed)
Cardiology Office Note:    Date:  08/19/2020   ID:  Martha Peterson, DOB [birth year], MRN [id number]  PCP:  [name], MD  Cardiologist:  No primary care provider on file.  Electrophysiologist:  None   Referring MD: [name], MD   Chief Complaint  Patient presents with  . 3 month follow up     "doing well." Medications reviewed by the patient verbally.     History of Present Illness:    Martha Peterson is a 80 y.o. female with a hx of  atrial fibrillation, HFpEF, hypertension, hyperlipidemia, carotid stenosis, innominate artery stenosis who presents for follow-up.  Patient being seen for atrial fibrillation and diastolic dysfunction.  Tolerating Coreg and Eliquis as prescribed.  Takes torsemide for lower extremity edema, which patient states is improving.  Denies palpitations, shortness of breath.    Historical notes  Patient has a history of mitral regurgitation being followed periodically with echocardiogram.  Last outside echocardiogram obtained on 05/26/2019, showed mildly reduced EF of 45%, impaired relaxation/grade 1 diastolicdysfunction, moderate concentric LVH, trivial MR, TR, aortic sclerosis no stenosis.  She denies any symptoms of chest pain or shortness of breath at rest or with activity.  She takes Coreg, losartan, HCTZ.  She has a known history of carotid stenosis with carotid bruit.CT angio of the neck showed high-grade calcific stenosis innominate artery, flow-limiting stenosis in the right carotid system.  There is sclerosis with calcification in the carotid bifurcation bilaterally without significant stenosis.  She is seeing vascular surgery for carotid artery disease.  She takes a baby aspirin and Lipitor 10 mg.  Has a history of liver abnormality hence the low-dose statin.     Past Medical History:  Diagnosis Date  . Arthritis   . Asthma   . Atrial fibrillation ([place])    per patient's daughter   . COPD (chronic obstructive pulmonary disease) ([place])    . Diverticulitis   . Gout   . Hyperlipidemia   . Hypertension   . Hypothyroidism   . 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 Microhematuria   . Mild mitral regurgitation   . Mild tricuspid regurgitation   . Osteopenia   . Pulmonary fibrosis ([place])   . Urinary incontinence     Past Surgical History:  Procedure Laterality Date  . ABDOMINAL HYSTERECTOMY    . ANKLE SURGERY Left   . APPENDECTOMY    . CAROTID PTA/STENT INTERVENTION N/A 07/17/2019   Procedure: CAROTID PTA/STENT INTERVENTION;  Surgeon: Algernon Huxley, MD;  Location: [facility];  Service: Cardiovascular;  Laterality: N/A;  . CAROTID-SUBCLAVIAN BYPASS GRAFT N/A 07/26/2019   Procedure: BYPASS GRAFT CAROTID-SUBCLAVIAN ( CAROTID TO CAROTID BYPASS);  Surgeon: Algernon Huxley, MD;  Location: [facility];  Service: Vascular;  Laterality: N/A;  . CHOLECYSTECTOMY    . COLONOSCOPY WITH PROPOFOL N/A 12/04/2014   Procedure: COLONOSCOPY WITH PROPOFOL;  Surgeon: Lollie Sails, MD;  Location: [facility];  Service: Endoscopy;  Laterality: N/A;  . FRACTURE SURGERY     Femur Fx; LT Ankle Pinning  . JOINT REPLACEMENT     RT TKR  . Patial Thyroid Resection      Current Medications: Current Meds  Medication Sig  . apixaban (ELIQUIS) 5 MG TABS tablet Take 1 tablet (5 mg total) by mouth 2 (two) times daily.  Marland Kitchen ascorbic acid (VITAMIN C) 500 MG tablet Take 500 mg by mouth daily.  Marland Kitchen aspirin EC 81 MG tablet Take 81 mg  by mouth daily.  Marland Kitchen atorvastatin (LIPITOR) 10 MG tablet Take 1 tablet (10 mg total) by mouth daily at 6 PM.  . carvedilol (COREG) 6.25 MG tablet Take 1 tablet (6.25 mg total) by mouth 2 (two) times daily with a meal.  . cholecalciferol (VITAMIN D) 400 UNITS TABS tablet Take 1,000 Units by mouth.  . diclofenac sodium (VOLTAREN) 1 % GEL Apply 3 grams to 3 large joints, up to 3 times daily as needed.  . fluticasone (FLONASE) 50 MCG/ACT nasal spray Place 2 sprays into both nostrils daily.  . Ginger,  Zingiber officinalis, (GINGER PO) Take by mouth.  Marland Kitchen ibuprofen (ADVIL) 600 MG tablet Take 1 tablet (600 mg total) by mouth every 8 (eight) hours as needed.  Marland Kitchen levothyroxine (SYNTHROID) 137 MCG tablet Take 1 tablet (137 mcg total) by mouth daily before breakfast.  . losartan-hydrochlorothiazide (HYZAAR) 100-25 MG tablet Take 1 tablet by mouth daily.  . magnesium oxide (MAG-OX) 400 MG tablet Take 400 mg by mouth daily.   . mirtazapine (REMERON) 7.5 MG tablet Take 1 tablet (7.5 mg total) by mouth at bedtime.  . Misc Natural Products (TART CHERRY ADVANCED PO) Take by mouth daily.  . Multiple Vitamins-Minerals (PRESERVISION AREDS 2 PO) Take by mouth 2 (two) times daily.  Marland Kitchen omega-3 acid ethyl esters (LOVAZA) 1 G capsule Take 1 g by mouth 2 (two) times daily.  Marland Kitchen oxyCODONE ER 13.5 MG C12A Take 13.5 mg by mouth 2 (two) times daily as needed.  . potassium chloride (KLOR-CON) 10 MEQ tablet Take 1 tablet (10 mEq total) by mouth daily.  . predniSONE (DELTASONE) 5 MG tablet Take 2 tablets (10 mg total) by mouth daily with breakfast.  . tiZANidine (ZANAFLEX) 4 MG tablet Take 1 tablet (4 mg total) by mouth 2 (two) times daily.  Marland Kitchen torsemide (DEMADEX) 20 MG tablet Take 1 tablet (20 mg total) by mouth daily.  . TURMERIC PO Take by mouth daily.  . vitamin E 400 UNIT capsule Take 800 Units by mouth daily.      Allergies:   Codeine, Levaquin [levofloxacin], and Tape   Social History   Socioeconomic History  . Marital status: Married    Spouse name: Jeneen Rinks  . Number of children: Not on file  . Years of education: Not on file  . Highest education level: Not on file  Occupational History  . Not on file  Tobacco Use  . Smoking status: Former Smoker    Years: 1.00    Types: Cigarettes  . Smokeless tobacco: Never Used  Vaping Use  . Vaping Use: Never used  Substance and Sexual Activity  . Alcohol use: No  . Drug use: No  . Sexual activity: Not Currently  Other Topics Concern  . Not on file  Social  History Narrative   Lives at home with husband in private residence   Social Determinants of Health   Financial Resource Strain: Not on file  Food Insecurity: Not on file  Transportation Needs: Not on file  Physical Activity: Not on file  Stress: Not on file  Social Connections: Not on file     Family History: The patient's family history includes Anxiety disorder in her daughter; Arthritis in her daughter; Depression in her daughter; Diabetes in her daughter, father, and son; Heart Problems in her father, mother, and sister; Hypertension in her son; Lupus in her sister; Neuropathy in her daughter; Thyroid disease in her sister.  ROS:   Please see the history of present illness.  All other systems reviewed and are negative.  EKGs/Labs/Other Studies Reviewed:      EKG:  EKG is  ordered today.  EKG shows atrial fibrillation, heart rate 101  Recent Labs: 10/03/2019: Magnesium 1.6 02/06/2020: ALT 22; Hemoglobin 12.5; Platelets 158 04/11/2020: TSH 1.650 07/10/2020: BUN 29; Creatinine, Ser 1.17; Potassium 4.8; Sodium 142  Recent Lipid Panel    Component Value Date/Time   CHOL 147 02/06/2020 1221   TRIG 140 02/06/2020 1221   HDL 46 02/06/2020 1221   LDLCALC 77 02/06/2020 1221    Physical Exam:    VS:  BP 120/60 (BP Location: Left Wrist, Patient Position: Sitting, Cuff Size: Normal)   Pulse (!) 101   Ht 5\' 3"  (1.6 m)   Wt 196 lb 2 oz (89 kg)   SpO2 98%   BMI 34.74 kg/m     Wt Readings from Last 3 Encounters:  08/19/20 196 lb 2 oz (89 kg)  08/13/20 198 lb 6.4 oz (90 kg)  07/11/20 202 lb (91.6 kg)     GEN:  Well nourished, well developed in no acute distress HEENT: Normal NECK: No JVD; bilateral carotid bruits noted LYMPHATICS: No lymphadenopathy CARDIAC: irreglar irregular, 2/6 systolic murmur noted, RESPIRATORY:  Clear to auscultation without rales, wheezing or rhonchi  ABDOMEN: Soft, non-tender, non-distended MUSCULOSKELETAL:1+ edema;. SKIN: Warm and  dry NEUROLOGIC:  Alert and oriented x 3 PSYCHIATRIC:  Normal affect   ASSESSMENT:    1. Atrial fibrillation, unspecified type ([place])   2. Grade II diastolic dysfunction   3. Essential hypertension   4. Bilateral carotid artery stenosis    PLAN:    In order of problems listed above:  1. Persistent atrial fibrillation, ekg shows irregular irregular rhythm. CHA2DS2-VASc of 5 (age, htn, vasc, gender).  Cont coreg 6.25 mg twice daily, Eliquis 5 mg twice daily. 2. Grade 2 diastolic dysfunction, 1+ lower extremity edema.  Continue torsemide.  Edema improved. 3. Hypertension, BP controlled.  Continue Coreg, Hyzaar. 4. carotid artery disease, aspirin, Eliquis, Lipitor.  Keep follow-up appointments with vascular surgery  Follow-up 6 months  This note was generated in part or whole with voice recognition software. Voice recognition is usually quite accurate but there are transcription errors that can and very often do occur. I apologize for any typographical errors that were not detected and corrected.  Medication Adjustments/Labs and Tests Ordered: Current medicines are reviewed at length with the patient today.  Concerns regarding medicines are outlined above.  Orders Placed This Encounter  Procedures  . EKG 12-Lead   No orders of the defined types were placed in this encounter.   Patient Instructions  Medication Instructions:   Your physician recommends that you continue on your current medications as directed. Please refer to the Current Medication list given to you today.   *If you need a refill on your cardiac medications before your next appointment, please call your pharmacy*   Lab Work: None ordered If you have labs (blood work) drawn today and your tests are completely normal, you will receive your results only by: Marland Kitchen MyChart Message (if you have MyChart) OR . A paper copy in the mail If you have any lab test that is abnormal or we need to change your treatment, we will  call you to review the results.   Testing/Procedures: None ordered   Follow-Up: At [facility], you and your health needs are our priority.  As part of our continuing mission to provide you with exceptional heart care, we have created designated  Provider Care Teams.  These Care Teams include your primary Cardiologist (physician) and Advanced Practice Providers (APPs -  Physician Assistants and Nurse Practitioners) who all work together to provide you with the care you need, when you need it.  We recommend signing up for the patient portal called "MyChart".  Sign up information is provided on this After Visit Summary.  MyChart is used to connect with patients for Virtual Visits (Telemedicine).  Patients are able to view lab/test results, encounter notes, upcoming appointments, etc.  Non-urgent messages can be sent to your provider as well.   To learn more about what you can do with MyChart, go to [email or web address].    Your next appointment:   6 month(s)  The format for your next appointment:   In Person  Provider:   Kate Sable, MD   Other Instructions      Signed, Kate Sable, MD  08/19/2020 4:15 PM    [place]

## 2020-08-19 NOTE — Patient Instructions (Signed)

## 2020-08-22 ENCOUNTER — Other Ambulatory Visit: Payer: Self-pay | Admitting: Nurse Practitioner

## 2020-08-22 DIAGNOSIS — J0100 Acute maxillary sinusitis, unspecified: Secondary | ICD-10-CM

## 2020-08-28 ENCOUNTER — Other Ambulatory Visit: Payer: Self-pay | Admitting: Nurse Practitioner

## 2020-08-28 DIAGNOSIS — M153 Secondary multiple arthritis: Secondary | ICD-10-CM

## 2020-09-02 ENCOUNTER — Other Ambulatory Visit: Payer: Self-pay

## 2020-09-02 DIAGNOSIS — E039 Hypothyroidism, unspecified: Secondary | ICD-10-CM

## 2020-09-02 DIAGNOSIS — M153 Secondary multiple arthritis: Secondary | ICD-10-CM

## 2020-09-02 DIAGNOSIS — E876 Hypokalemia: Secondary | ICD-10-CM

## 2020-09-02 DIAGNOSIS — J0100 Acute maxillary sinusitis, unspecified: Secondary | ICD-10-CM

## 2020-09-02 MED ORDER — TIZANIDINE HCL 4 MG PO TABS: 4.0000 mg | ORAL_TABLET | Freq: Two times a day (BID) | ORAL | 1 refills | Status: DC

## 2020-09-02 MED ORDER — ATORVASTATIN CALCIUM 10 MG PO TABS: 10.0000 mg | ORAL_TABLET | Freq: Every day | ORAL | 1 refills | Status: DC

## 2020-09-02 MED ORDER — LEVOTHYROXINE SODIUM 137 MCG PO TABS: 137.0000 ug | ORAL_TABLET | Freq: Every day | ORAL | 1 refills | Status: DC

## 2020-09-02 MED ORDER — LOSARTAN POTASSIUM-HCTZ 100-25 MG PO TABS: 1.0000 | ORAL_TABLET | Freq: Every day | ORAL | 1 refills | Status: DC

## 2020-09-02 MED ORDER — OMEPRAZOLE 20 MG PO CPDR: 20.0000 mg | DELAYED_RELEASE_CAPSULE | Freq: Every day | ORAL | 1 refills | Status: DC

## 2020-09-02 MED ORDER — POTASSIUM CHLORIDE ER 10 MEQ PO TBCR: 10.0000 meq | EXTENDED_RELEASE_TABLET | Freq: Every day | ORAL | 1 refills | Status: DC

## 2020-09-02 MED ORDER — PREDNISONE 5 MG PO TABS: 10.0000 mg | ORAL_TABLET | Freq: Every day | ORAL | 1 refills | Status: DC

## 2020-09-03 ENCOUNTER — Other Ambulatory Visit: Payer: Self-pay

## 2020-09-09 ENCOUNTER — Encounter: Payer: Self-pay | Admitting: Nurse Practitioner

## 2020-09-11 ENCOUNTER — Ambulatory Visit: Payer: Medicare HMO | Admitting: Hospice and Palliative Medicine

## 2020-09-16 ENCOUNTER — Other Ambulatory Visit: Payer: Self-pay | Admitting: Nurse Practitioner

## 2020-09-17 DIAGNOSIS — N183 Chronic kidney disease, stage 3 unspecified: Secondary | ICD-10-CM | POA: Diagnosis not present

## 2020-09-18 ENCOUNTER — Other Ambulatory Visit: Payer: Self-pay | Admitting: Hospice and Palliative Medicine

## 2020-09-18 DIAGNOSIS — N183 Chronic kidney disease, stage 3 unspecified: Secondary | ICD-10-CM

## 2020-09-18 LAB — COMPREHENSIVE METABOLIC PANEL
ALT: 18 IU/L (ref 0–32)
AST: 36 IU/L (ref 0–40)
Albumin/Globulin Ratio: 1.2 (ref 1.2–2.2)
Albumin: 4.2 g/dL (ref 3.6–4.6)
Alkaline Phosphatase: 167 IU/L — ABNORMAL HIGH (ref 44–121)
BUN/Creatinine Ratio: 40 — ABNORMAL HIGH (ref 12–28)
BUN: 44 mg/dL — ABNORMAL HIGH (ref 8–27)
Bilirubin Total: 0.5 mg/dL (ref 0.0–1.2)
CO2: 27 mmol/L (ref 20–29)
Calcium: 9.9 mg/dL (ref 8.7–10.3)
Chloride: 99 mmol/L (ref 96–106)
Creatinine, Ser: 1.1 mg/dL — ABNORMAL HIGH (ref 0.57–1.00)
Globulin, Total: 3.4 g/dL (ref 1.5–4.5)
Glucose: 116 mg/dL — ABNORMAL HIGH (ref 65–99)
Potassium: 4.2 mmol/L (ref 3.5–5.2)
Sodium: 143 mmol/L (ref 134–144)
Total Protein: 7.6 g/dL (ref 6.0–8.5)
eGFR: 50 mL/min/{1.73_m2} — ABNORMAL LOW (ref >59)

## 2020-09-18 NOTE — Progress Notes (Signed)
Please call and let her know that her kidney function remains abnormal but stable. I have ordered a renal vascular US to further look at her kidneys. Front staff will call her to set this [place] up. Thanks.

## 2020-09-25 ENCOUNTER — Other Ambulatory Visit: Payer: Self-pay

## 2020-09-25 ENCOUNTER — Ambulatory Visit (INDEPENDENT_AMBULATORY_CARE_PROVIDER_SITE_OTHER): Payer: Medicare HMO | Admitting: Nurse Practitioner

## 2020-09-25 ENCOUNTER — Encounter: Payer: Self-pay | Admitting: Nurse Practitioner

## 2020-09-25 VITALS — BP 111/73 | HR 109 | Temp 97.6°F | Ht 63.0 in | Wt 194.1 lb

## 2020-09-25 DIAGNOSIS — Z7689 Persons encountering health services in other specified circumstances: Secondary | ICD-10-CM | POA: Diagnosis not present

## 2020-09-25 DIAGNOSIS — M064 Inflammatory polyarthropathy: Secondary | ICD-10-CM

## 2020-09-25 DIAGNOSIS — D709 Neutropenia, unspecified: Secondary | ICD-10-CM

## 2020-09-25 DIAGNOSIS — M158 Other polyosteoarthritis: Secondary | ICD-10-CM

## 2020-09-25 DIAGNOSIS — N183 Chronic kidney disease, stage 3 unspecified: Secondary | ICD-10-CM | POA: Diagnosis not present

## 2020-09-25 DIAGNOSIS — M1612 Unilateral primary osteoarthritis, left hip: Secondary | ICD-10-CM | POA: Diagnosis not present

## 2020-09-25 DIAGNOSIS — I6523 Occlusion and stenosis of bilateral carotid arteries: Secondary | ICD-10-CM

## 2020-09-25 DIAGNOSIS — I5020 Unspecified systolic (congestive) heart failure: Secondary | ICD-10-CM

## 2020-09-25 DIAGNOSIS — F321 Major depressive disorder, single episode, moderate: Secondary | ICD-10-CM

## 2020-09-25 DIAGNOSIS — K7581 Nonalcoholic steatohepatitis (NASH): Secondary | ICD-10-CM

## 2020-09-25 DIAGNOSIS — I482 Chronic atrial fibrillation, unspecified: Secondary | ICD-10-CM | POA: Diagnosis not present

## 2020-09-25 DIAGNOSIS — D696 Thrombocytopenia, unspecified: Secondary | ICD-10-CM

## 2020-09-25 DIAGNOSIS — R600 Localized edema: Secondary | ICD-10-CM | POA: Diagnosis not present

## 2020-09-25 DIAGNOSIS — I771 Stricture of artery: Secondary | ICD-10-CM

## 2020-09-25 DIAGNOSIS — K7460 Unspecified cirrhosis of liver: Secondary | ICD-10-CM

## 2020-09-25 MED ORDER — OXYCODONE HCL 10 MG PO TABS: 10.0000 mg | ORAL_TABLET | Freq: Every day | ORAL | 0 refills | Status: DC | PRN

## 2020-09-25 MED ORDER — IBUPROFEN 600 MG PO TABS: 600.0000 mg | ORAL_TABLET | Freq: Three times a day (TID) | ORAL | 1 refills | Status: DC | PRN

## 2020-09-25 MED ORDER — MIRTAZAPINE 7.5 MG PO TABS: 11.2500 mg | ORAL_TABLET | Freq: Every day | ORAL | 2 refills | Status: DC

## 2020-09-25 MED ORDER — OXYCODONE ER 13.5 MG PO C12A: 13.5000 mg | EXTENDED_RELEASE_CAPSULE | Freq: Two times a day (BID) | ORAL | 0 refills | Status: DC | PRN

## 2020-09-25 MED ORDER — TORSEMIDE 20 MG PO TABS: 20.0000 mg | ORAL_TABLET | Freq: Every day | ORAL | 1 refills | Status: DC
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
## 2020-09-25 NOTE — Patient Instructions (Signed)
Chronic Back Pain When back pain lasts longer than 3 months, it is called chronic back pain. Pain may get worse at certain times (flare-ups). There are things you can do at home to manage your pain. Follow these instructions at home: Pay attention to any changes in your symptoms. Take these actions to help with your pain: Managing pain and stiffness  If told, put ice on the painful area. Your doctor may tell you to use ice for 24-48 hours after the flare-up starts. To do this: ? Put ice in a plastic bag. ? Place a towel between your skin and the bag. ? Leave the ice on for 20 minutes, 2-3 times a day.  If told, put heat on the painful area. Do this as often as told by your doctor. Use the heat source that your doctor recommends, such as a moist heat pack or a heating pad. ? Place a towel between your skin and the heat source. ? Leave the heat on for 20-30 minutes. ? Take off the heat if your skin turns bright red. This is especially important if you are unable to feel pain, heat, or cold. You may have a greater risk of getting burned.  Soak in a warm bath. This can help relieve pain.      Activity  Avoid bending and other activities that make pain worse.  When standing: ? Keep your upper back and neck straight. ? Keep your shoulders pulled back. ? Avoid slouching.  When sitting: ? Keep your back straight. ? Relax your shoulders. Do not round your shoulders or pull them backward.  Do not sit or stand in one place for long periods of time.  Take short rest breaks during the day. Lying down or standing is usually better than sitting. Resting can help relieve pain.  When sitting or lying down for a long time, do some mild activity or stretching. This will help to prevent stiffness and pain.  Get regular exercise. Ask your doctor what activities are safe for you.  Do not lift anything that is heavier than 10 lb (4.5 kg) or the limit that you are told, until your doctor says that it  is safe.  To prevent injury when you lift things: ? Bend your knees. ? Keep the weight close to your body. ? Avoid twisting.  Sleep on a firm mattress. Try lying on your side with your knees slightly bent. If you lie on your back, put a pillow under your knees.   Medicines  Treatment may include medicines for pain and swelling taken by mouth or put on the skin, prescription pain medicine, or muscle relaxants.  Take over-the-counter and prescription medicines only as told by your doctor.  Ask your doctor if the medicine prescribed to you: ? Requires you to avoid driving or using machinery. ? Can cause trouble pooping (constipation). You may need to take these actions to prevent or treat trouble pooping:  Drink enough fluid to keep your pee (urine) pale yellow.  Take over-the-counter or prescription medicines.  Eat foods that are high in fiber. These include beans, whole grains, and fresh fruits and vegetables.  Limit foods that are high in fat and sugars. These include fried or sweet foods. General instructions  Do not use any products that contain nicotine or tobacco, such as cigarettes, e-cigarettes, and chewing tobacco. If you need help quitting, ask your doctor.  Keep all follow-up visits as told by your doctor. This is important. Contact a doctor if:    Your pain does not get better with rest or medicine.  Your pain gets worse, or you have new pain.  You have a high fever.  You lose weight very quickly.  You have trouble doing your normal activities. Get help right away if:  One or both of your legs or feet feel weak.  One or both of your legs or feet lose feeling (have numbness).  You have trouble controlling when you poop (have a bowel movement) or pee (urinate).  You have bad back pain and: ? You feel like you may vomit (nauseous), or you vomit. ? You have pain in your belly (abdomen). ? You have shortness of breath. ? You faint. Summary  When back pain  lasts longer than 3 months, it is called chronic back pain.  Pain may get worse at certain times (flare-ups).  Use ice and heat as told by your doctor. Your doctor may tell you to use ice after flare-ups. This information is not intended to replace advice given to you by your health care provider. Make sure you discuss any questions you have with your health care provider. Document Revised: 07/12/2019 Document Reviewed: 07/12/2019 Elsevier Patient Education  2021 [employer]  

## 2020-09-26 DIAGNOSIS — I482 Chronic atrial fibrillation, unspecified: Secondary | ICD-10-CM | POA: Insufficient documentation

## 2020-09-26 DIAGNOSIS — D696 Thrombocytopenia, unspecified: Secondary | ICD-10-CM | POA: Insufficient documentation

## 2020-09-26 DIAGNOSIS — I5020 Unspecified systolic (congestive) heart failure: Secondary | ICD-10-CM | POA: Insufficient documentation

## 2020-09-26 DIAGNOSIS — Z7689 Persons encountering health services in other specified circumstances: Secondary | ICD-10-CM | POA: Insufficient documentation

## 2020-09-26 MED ORDER — OXYCODONE ER 13.5 MG PO C12A: 13.5000 mg | EXTENDED_RELEASE_CAPSULE | Freq: Two times a day (BID) | ORAL | 0 refills | Status: DC | PRN

## 2020-09-26 MED ORDER — OXYCODONE HCL 10 MG PO TABS: 10.0000 mg | ORAL_TABLET | Freq: Every day | ORAL | 0 refills | Status: DC | PRN

## 2020-10-15 ENCOUNTER — Other Ambulatory Visit: Payer: Self-pay | Admitting: Oncology

## 2020-10-15 ENCOUNTER — Inpatient Hospital Stay: Payer: Medicare HMO

## 2020-10-16 ENCOUNTER — Other Ambulatory Visit: Payer: Self-pay

## 2020-10-16 ENCOUNTER — Inpatient Hospital Stay: Payer: Medicare HMO | Attending: Oncology

## 2020-10-16 DIAGNOSIS — D472 Monoclonal gammopathy: Secondary | ICD-10-CM | POA: Insufficient documentation

## 2020-10-16 LAB — CBC WITH DIFFERENTIAL/PLATELET
Abs Immature Granulocytes: 0.01 10*3/uL (ref 0.00–0.07)
Basophils Absolute: 0 10*3/uL (ref 0.0–0.1)
Basophils Relative: 1 %
Eosinophils Absolute: 0.1 10*3/uL (ref 0.0–0.5)
Eosinophils Relative: 3 %
HCT: 37.5 % (ref 36.0–46.0)
Hemoglobin: 12.3 g/dL (ref 12.0–15.0)
Immature Granulocytes: 0 %
Lymphocytes Relative: 24 %
Lymphs Abs: 1 10*3/uL (ref 0.7–4.0)
MCH: 31.5 pg (ref 26.0–34.0)
MCHC: 32.8 g/dL (ref 30.0–36.0)
MCV: 95.9 fL (ref 80.0–100.0)
Monocytes Absolute: 0.4 10*3/uL (ref 0.1–1.0)
Monocytes Relative: 9 %
Neutro Abs: 2.7 10*3/uL (ref 1.7–7.7)
Neutrophils Relative %: 63 %
Platelets: 121 10*3/uL — ABNORMAL LOW (ref 150–400)
RBC: 3.91 MIL/uL (ref 3.87–5.11)
RDW: 13.2 % (ref 11.5–15.5)
WBC: 4.2 10*3/uL (ref 4.0–10.5)
nRBC: 0 % (ref 0.0–0.2)

## 2020-10-17 LAB — IGG, IGA, IGM
IgA: 649 mg/dL — ABNORMAL HIGH (ref 64–422)
IgG (Immunoglobin G), Serum: 1479 mg/dL (ref 586–1602)
IgM (Immunoglobulin M), Srm: 60 mg/dL (ref 26–217)

## 2020-10-17 LAB — KAPPA/LAMBDA LIGHT CHAINS
Kappa free light chain: 45 mg/L — ABNORMAL HIGH (ref 3.3–19.4)
Kappa, lambda light chain ratio: 0.96 (ref 0.26–1.65)
Lambda free light chains: 46.8 mg/L — ABNORMAL HIGH (ref 5.7–26.3)

## 2020-10-18 LAB — PROTEIN ELECTROPHORESIS, SERUM
A/G Ratio: 1.2 (ref 0.7–1.7)
Albumin ELP: 3.9 g/dL (ref 2.9–4.4)
Alpha-1-Globulin: 0.2 g/dL (ref 0.0–0.4)
Alpha-2-Globulin: 0.6 g/dL (ref 0.4–1.0)
Beta Globulin: 0.8 g/dL (ref 0.7–1.3)
Gamma Globulin: 1.6 g/dL (ref 0.4–1.8)
Globulin, Total: 3.2 g/dL (ref 2.2–3.9)
M-Spike, %: 0.4 g/dL — ABNORMAL HIGH
Total Protein ELP: 7.1 g/dL (ref 6.0–8.5)

## 2020-10-18 NOTE — Progress Notes (Signed)
[place]  [phone]  Fax:([phone]) [place] DOB: Jun 10, 1941  MR#: [id number]  MBE#:[id number]  Patient Care Team: Ronnell Freshwater, NP as PCP - General (Family Medicine) Lloyd Huger, MD as Consulting Physician (Oncology)  I connected with Johny Sax on 10/23/20 at  2:00 PM EDT by video enabled telemedicine visit and verified that I am speaking with the correct person using two identifiers.   I discussed the limitations, risks, security and privacy concerns of performing an evaluation and management service by telemedicine and the availability of in-person appointments. I also discussed with the patient that there may be a patient responsible charge related to this service. The patient expressed understanding and agreed to proceed.   Other persons participating in the visit and their role in the encounter: Patient, patient's daughter, MD.  Patient's location: Home. Provider's location: Clinic.  CHIEF COMPLAINT: MGUS  INTERVAL HISTORY: Patient agreed to video assisted telemedicine visit for discussion of her laboratory work and routine yearly evaluation.  She continues to feel well and remains asymptomatic.  She has no neurologic complaints. She denies any recent fevers or illnesses. She has a good appetite and denies weight loss. She denies any chest pain, shortness of breath, cough, or hemoptysis.. She denies any nausea, vomiting, constipation, or diarrhea. She has no urinary complaints.  She denies any bony pain.  Patient offers no specific complaints today.  REVIEW OF SYSTEMS:   Review of Systems  Constitutional: Negative.  Negative for fever, malaise/fatigue and weight loss.  Respiratory: Negative.  Negative for cough and shortness of breath.   Cardiovascular: Negative.  Negative for chest pain and leg swelling.  Gastrointestinal: Negative.  Negative for abdominal pain and constipation.  Genitourinary: Negative.   Negative for dysuria.  Musculoskeletal: Negative.  Negative for back pain.  Neurological: Negative.  Negative for sensory change, focal weakness and weakness.  Endo/Heme/Allergies: Does not bruise/bleed easily.  Psychiatric/Behavioral: Negative.  The patient is not nervous/anxious.    As per HPI. Otherwise, a complete review of systems is negative.   PAST MEDICAL HISTORY: Past Medical History:  Diagnosis Date  . Arthritis   . Asthma   . Atrial fibrillation ([place])    per patient's daughter   . COPD (chronic obstructive pulmonary disease) ([place])   . Diverticulitis   . Gout   . Hyperlipidemia   . Hypertension   . Hypothyroidism   . 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 Microhematuria   . Mild mitral regurgitation   . Mild tricuspid regurgitation   . Osteopenia   . Pulmonary fibrosis ([place])   . Urinary incontinence     PAST SURGICAL HISTORY: Past Surgical History:  Procedure Laterality Date  . ABDOMINAL HYSTERECTOMY    . ANKLE SURGERY Left   . APPENDECTOMY    . CAROTID PTA/STENT INTERVENTION N/A 07/17/2019   Procedure: CAROTID PTA/STENT INTERVENTION;  Surgeon: Algernon Huxley, MD;  Location: [place] [facility];  Service: Cardiovascular;  Laterality: N/A;  . CAROTID-SUBCLAVIAN BYPASS GRAFT N/A 07/26/2019   Procedure: BYPASS GRAFT CAROTID-SUBCLAVIAN ( CAROTID TO CAROTID BYPASS);  Surgeon: Algernon Huxley, MD;  Location: [facility] ORS;  Service: Vascular;  Laterality: N/A;  . CHOLECYSTECTOMY    . COLONOSCOPY WITH PROPOFOL N/A 12/04/2014   Procedure: COLONOSCOPY WITH PROPOFOL;  Surgeon: Lollie Sails, MD;  Location: [facility] ENDOSCOPY;  Service: Endoscopy;  Laterality: N/A;  . FRACTURE SURGERY     Femur Fx; LT Ankle Pinning  .  JOINT REPLACEMENT     RT TKR  . Patial Thyroid Resection      FAMILY HISTORY: Reviewed and unchanged. No reported history of malignancy or chronic disease.   GYNECOLOGIC HISTORY:  No LMP recorded. Patient has had a hysterectomy.      ADVANCED DIRECTIVES:    HEALTH MAINTENANCE: Social History   Tobacco Use  . Smoking status: Former Smoker    Years: 1.00    Types: Cigarettes  . Smokeless tobacco: Never Used  Vaping Use  . Vaping Use: Never used  Substance Use Topics  . Alcohol use: No  . Drug use: No       Allergies  Allergen Reactions  . Latex Hives, Itching and Rash  . Codeine   . Levaquin [Levofloxacin]   . Tape     latex    Current Outpatient Medications  Medication Sig Dispense Refill  . apixaban (ELIQUIS) 5 MG TABS tablet Take 1 tablet (5 mg total) by mouth 2 (two) times daily. 60 tablet 5  . ascorbic acid (VITAMIN C) 500 MG tablet Take 500 mg by mouth daily.    Marland Kitchen aspirin EC 81 MG tablet Take 81 mg by mouth daily.    Marland Kitchen atorvastatin (LIPITOR) 10 MG tablet Take 1 tablet (10 mg total) by mouth daily at 6 PM. 90 tablet 1  . carvedilol (COREG) 6.25 MG tablet Take 1 tablet (6.25 mg total) by mouth 2 (two) times daily with a meal. 60 tablet 5  . cholecalciferol (VITAMIN D) 400 UNITS TABS tablet Take 1,000 Units by mouth.    . diclofenac sodium (VOLTAREN) 1 % GEL Apply 3 grams to 3 large joints, up to 3 times daily as needed. 3 Tube 3  . fluticasone (FLONASE) 50 MCG/ACT nasal spray Place 2 sprays into both nostrils daily. 16 g 6  . Ginger, Zingiber officinalis, (GINGER PO) Take by mouth.    Marland Kitchen ibuprofen (ADVIL) 600 MG tablet Take 1 tablet (600 mg total) by mouth every 8 (eight) hours as needed. 60 tablet 1  . levothyroxine (SYNTHROID) 137 MCG tablet Take 1 tablet (137 mcg total) by mouth daily before breakfast. 90 tablet 1  . losartan-hydrochlorothiazide (HYZAAR) 100-25 MG tablet Take 1 tablet by mouth daily. 90 tablet 1  . magnesium oxide (MAG-OX) 400 MG tablet Take 400 mg by mouth daily.     . mirtazapine (REMERON) 7.5 MG tablet Take 1.5 tablets (11.25 mg total) by mouth at bedtime. 45 tablet 2  . Misc Natural Products (TART CHERRY ADVANCED PO) Take by mouth daily.    . Multiple Vitamins-Minerals  (PRESERVISION AREDS 2 PO) Take by mouth 2 (two) times daily.    Marland Kitchen omega-3 acid ethyl esters (LOVAZA) 1 G capsule Take 1 g by mouth 2 (two) times daily.    Marland Kitchen oxyCODONE ER 13.5 MG C12A Take 13.5 mg by mouth 2 (two) times daily as needed. 60 capsule 0  . Oxycodone HCl 10 MG TABS Take 1 tablet (10 mg total) by mouth daily as needed. 30 tablet 0  . potassium chloride (KLOR-CON) 10 MEQ tablet Take 1 tablet (10 mEq total) by mouth daily. 90 tablet 1  . predniSONE (DELTASONE) 5 MG tablet Take 2 tablets (10 mg total) by mouth daily with breakfast. 180 tablet 1  . tiZANidine (ZANAFLEX) 4 MG tablet Take 1 tablet (4 mg total) by mouth 2 (two) times daily. 90 tablet 1  . torsemide (DEMADEX) 20 MG tablet Take 1 tablet (20 mg total) by mouth daily. 90 tablet  1  . TURMERIC PO Take by mouth daily.    . vitamin E 400 UNIT capsule Take 800 Units by mouth daily.      No current facility-administered medications for this visit.    OBJECTIVE: There were no vitals taken for this visit.   There is no height or weight on file to calculate BMI.    ECOG FS:0 - Asymptomatic  General: Well-developed, well-nourished, no acute distress. HEENT: Normocephalic. Neuro: Alert, answering all questions appropriately. Cranial nerves grossly intact. Psych: Normal affect.   LAB RESULTS:  No visits with results within 3 Day(s) from this visit.  Latest known visit with results is:  Appointment on 10/16/2020  Component Date Value Ref Range Status  . Total Protein ELP 10/16/2020 7.1  6.0 - 8.5 g/dL Final  . Albumin ELP 10/16/2020 3.9  2.9 - 4.4 g/dL Final  . Alpha-1-Globulin 10/16/2020 0.2  0.0 - 0.4 g/dL Final  . Alpha-2-Globulin 10/16/2020 0.6  0.4 - 1.0 g/dL Final  . Beta Globulin 10/16/2020 0.8  0.7 - 1.3 g/dL Final  . Gamma Globulin 10/16/2020 1.6  0.4 - 1.8 g/dL Final  . M-Spike, % 10/16/2020 0.4* Not Observed g/dL Final  . SPE Interp. 10/16/2020 Comment   Final   Comment: (NOTE) The SPE pattern demonstrates a single  peak (M-spike) in the gamma region which may represent monoclonal protein. This peak may also be caused by circulating immune complexes, cryoglobulins, C-reactive protein, fibrinogen or hemolysis.  If clinically indicated, the presence of a monoclonal gammopathy may be confirmed by immuno- fixation, as well as an evaluation of the urine for the presence of Bence-Jones protein. Performed At: [facility] [place], [place] [id number] Rush Farmer MD OZ:[id number]   . Comment 10/16/2020 Comment   Final   Comment: (NOTE) Protein electrophoresis scan will follow via computer, mail, or courier delivery.   . Globulin, Total 10/16/2020 3.2  2.2 - 3.9 g/dL Corrected  . A/G Ratio 10/16/2020 1.2  0.7 - 1.7 Corrected  . Kappa free light chain 10/16/2020 45.0* 3.3 - 19.4 mg/L Final  . Lamda free light chains 10/16/2020 46.8* 5.7 - 26.3 mg/L Final  . Kappa, lamda light chain ratio 10/16/2020 0.96  0.26 - 1.65 Final   Comment: (NOTE) Performed At: [facility] [place], [place] [id number] Rush Farmer MD KG:[id number]   . IgG (Immunoglobin G), Serum 10/16/2020 1,479  586 - 1,602 mg/dL Final  . IgA 10/16/2020 649* 64 - 422 mg/dL Final  . IgM (Immunoglobulin M), Srm 10/16/2020 60  26 - 217 mg/dL Final   Comment: (NOTE) Performed At: [facility] [place], [place] [id number] Rush Farmer MD VZ:[id number]   . WBC 10/16/2020 4.2  4.0 - 10.5 K/uL Final  . RBC 10/16/2020 3.91  3.87 - 5.11 MIL/uL Final  . Hemoglobin 10/16/2020 12.3  12.0 - 15.0 g/dL Final  . HCT 10/16/2020 37.5  36.0 - 46.0 % Final  . MCV 10/16/2020 95.9  80.0 - 100.0 fL Final  . MCH 10/16/2020 31.5  26.0 - 34.0 pg Final  . MCHC 10/16/2020 32.8  30.0 - 36.0 g/dL Final  . RDW 10/16/2020 13.2  11.5 - 15.5 % Final  . Platelets 10/16/2020 121* 150 - 400 K/uL Final  . nRBC 10/16/2020 0.0  0.0 - 0.2 % Final  . Neutrophils Relative % 10/16/2020 63  % Final  .  Neutro Abs 10/16/2020 2.7  1.7 - 7.7 K/uL Final  . Lymphocytes Relative 10/16/2020 24  %  Final  . Lymphs Abs 10/16/2020 1.0  0.7 - 4.0 K/uL Final  . Monocytes Relative 10/16/2020 9  % Final  . Monocytes Absolute 10/16/2020 0.4  0.1 - 1.0 K/uL Final  . Eosinophils Relative 10/16/2020 3  % Final  . Eosinophils Absolute 10/16/2020 0.1  0.0 - 0.5 K/uL Final  . Basophils Relative 10/16/2020 1  % Final  . Basophils Absolute 10/16/2020 0.0  0.0 - 0.1 K/uL Final  . Immature Granulocytes 10/16/2020 0  % Final  . Abs Immature Granulocytes 10/16/2020 0.01  0.00 - 0.07 K/uL Final   Performed at [facility], [address] [place], [place] [address]    STUDIES: No results found.  ASSESSMENT: MGUS  PLAN:   1. MGUS: Patient's most recent M spike remained stable at 0.4.  This is unchanged for nearly 10 years when it was reported at 0.5 in February 2013.  IgA levels and kappa free light chains are also chronically elevated at 649 and 45.0 respectively, but essentially unchanged.  She has no evidence of endorgan damage.  Her most recent creatinine was 1.1 and calcium and hemoglobin levels remain within normal limits.  She has a mild thrombocytopenia that is chronic and unchanged.  After lengthy discussion with the patient, it was determined that no further follow-up is necessary.  There is no need to continue to monitoring her M spike unless there is suspicion of progression.  No follow-up has been scheduled.  Please refer patient back if there are any questions or concerns.  2. Leukopenia: Resolved.  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 By report, previous bone marrow biopsy was negative for underlying pathology, but we do not have these results. 3. Thrombocytopenia: Chronic and unchanged since April 2010.  Patient's platelet count today is 121 4.  Renal insufficiency: Nearly resolved.  Patient's most recent creatinine  is 1.1.  I provided 20 minutes of face-to-face video visit time during this encounter which included chart review, counseling, and coordination of care as documented above.    Patient expressed understanding and was in agreement with this plan. She also understands that She can call clinic at any time with any questions, concerns, or complaints.    Lloyd Huger, MD   10/23/2020 10:08 AM

## 2020-10-18 NOTE — Progress Notes (Signed)
Office Visit Note  Patient: Martha Peterson             Date of Birth: 18-Jul-1940           MRN: [id number]             PCP: Ronnell Freshwater, NP Referring: [name], MD Visit Date: 10/31/2020 Occupation: @GUAROCC @  Subjective:  Lower back pain   History of Present Illness: SRUTI AYLLON is a 80 y.o. female with history of osteoarthritis she continues to have pain and discomfort in bilateral hands and her hip joints.  The pain is mostly in her CMC joints.  She states recently she has been having increased lower back pain.  She also has difficulty with mobility due to arthritis in multiple joints.  She uses a cane for mobility.    Activities of Daily Living:  Patient reports morning stiffness for 2-3 hours.   Patient Denies nocturnal pain.  Difficulty dressing/grooming: Denies Difficulty climbing stairs: Reports Difficulty getting out of chair: Denies Difficulty using hands for taps, buttons, cutlery, and/or writing: Denies  Review of Systems  Constitutional: Negative for fatigue.  HENT: Negative for mouth sores, mouth dryness and nose dryness.   Eyes: Positive for dryness. Negative for pain and itching.  Respiratory: Negative for shortness of breath and difficulty breathing.   Cardiovascular: Negative for chest pain and palpitations.  Gastrointestinal: Negative for blood in stool, constipation and diarrhea.  Endocrine: Negative for increased urination.  Genitourinary: Negative for difficulty urinating.  Musculoskeletal: Positive for arthralgias, joint pain, myalgias, morning stiffness and myalgias. Negative for joint swelling and muscle tenderness.  Skin: Negative for color change, rash and redness.  Allergic/Immunologic: Negative for susceptible to infections.  Neurological: Negative for dizziness, numbness, headaches, memory loss and weakness.  Hematological: Negative for bruising/bleeding tendency.  Psychiatric/Behavioral: Negative for confusion.    PMFS  History:  Patient Active Problem List   Diagnosis Date Noted  . Encounter to establish care 09/26/2020  . Chronic atrial fibrillation ([place]) 09/26/2020  . Heart failure with reduced ejection fraction ([place]) 09/26/2020  . Thrombocytopenia, unspecified ([place]) 09/26/2020  . Lymphedema 07/11/2020  . Elevated glucose 06/27/2020  . Chronic pain syndrome 06/27/2020  . Stage 3 chronic kidney disease ([place]) 06/27/2020  . Cellulitis of left lower extremity 06/27/2020  . Needs flu shot 05/08/2020  . Lower extremity edema 04/16/2020  . [name] (nonalcoholic steatohepatitis) 04/16/2020  . Encounter for general adult medical examination with abnormal findings 02/25/2020  . Chronic gout without tophus 02/25/2020  . Current moderate episode of major depressive disorder ([place]) 02/25/2020  . Tick bite of left hip 12/06/2019  . Encounter for screening mammogram for malignant neoplasm of breast 12/06/2019  . Screening for osteoporosis 12/06/2019  . S/P carotid endarterectomy 09/09/2019  . Iron deficiency anemia 09/09/2019  . Abnormal renal function 09/09/2019  . Hypomagnesemia 09/09/2019  . Hospital discharge follow-up 08/02/2019  . 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 Left ventricular systolic dysfunction 62/94/7654  . Diastolic dysfunction 65/08/5463  . Degenerative joint disease involving multiple joints 05/07/2019  . Nonalcoholic fatty liver disease 02/12/2019  . Dysuria 02/12/2019  . Encounter for long-term (current) use of medications 01/22/2019  . Hypokalemia 04/27/2018  . Osteopenia 04/04/2018  . Hypothyroidism (acquired) 02/14/2018  . Neutropenia ([place]) 02/14/2018  . Primary osteoarthritis of left hip 12/06/2017  . Essential hypertension 09/29/2017  . Postural dizziness 09/29/2017  . 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  Acquired left ventricular hypertrophy 09/29/2017  . Inflammatory polyarthritis  ([place]) 09/29/2017  . Need for vaccination for Strep pneumoniae 09/29/2017  . H/O ankle fusion 05/02/2016  . 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 Abnormal LFTs 02/13/2014  . 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 Primary osteoarthritis of both hands 02/13/2014  . H/O: gout 02/13/2014  . Rheumatoid factor positive 02/13/2014    Past Medical History:  Diagnosis Date  . Arthritis   . Asthma   . Atrial fibrillation ([place])    per patient's daughter   . COPD (chronic obstructive pulmonary disease) ([place])   . Diverticulitis   . Gout   . Hyperlipidemia   . Hypertension   . Hypothyroidism   . 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 Microhematuria   . Mild mitral regurgitation   . Mild tricuspid regurgitation   . Osteopenia   . Pulmonary fibrosis ([place])   . Urinary incontinence     Family History  Problem Relation Age of Onset  . Heart Problems Mother   . Heart Problems Father   . Diabetes Father   . Lupus Sister   . Thyroid disease Sister   . Heart Problems Sister   . Hypertension Son   . Diabetes Son        borderline   . Arthritis Daughter        in the left knee   . Diabetes Daughter   . Neuropathy Daughter   . Depression Daughter   . Anxiety disorder Daughter    Past Surgical History:  Procedure Laterality Date  . ABDOMINAL HYSTERECTOMY    . ANKLE SURGERY Left   . APPENDECTOMY    . CAROTID PTA/STENT INTERVENTION N/A 07/17/2019   Procedure: CAROTID PTA/STENT INTERVENTION;  Surgeon: Algernon Huxley, MD;  Location: [place] CV LAB;  Service: Cardiovascular;  Laterality: N/A;  . CAROTID-SUBCLAVIAN BYPASS GRAFT N/A 07/26/2019   Procedure: BYPASS GRAFT CAROTID-SUBCLAVIAN ( CAROTID TO CAROTID BYPASS);  Surgeon: Algernon Huxley, MD;  Location: [facility] ORS;  Service: Vascular;  Laterality: N/A;  . CHOLECYSTECTOMY    . COLONOSCOPY WITH PROPOFOL N/A 12/04/2014   Procedure: COLONOSCOPY WITH PROPOFOL;  Surgeon: Lollie Sails, MD;   Location[facility] ENDOSCOPY;  Service: Endoscopy;  Laterality: N/A;  . FRACTURE SURGERY     Femur Fx; LT Ankle Pinning  . JOINT REPLACEMENT     RT TKR  . Patial Thyroid Resection     Social History   Social History Narrative   Lives at home with husband in private residence   Immunization History  Administered Date(s) Administered  . Influenza Inj Mdck Quad Pf 03/18/2018, 04/14/2019, 04/16/2020  . Influenza-Unspecified 05/04/2017  . Moderna Sars-Covid-2 Vaccination 08/09/2019, 09/06/2019, 07/25/2020  . Pneumococcal Conjugate-13 03/01/2018  . Pneumococcal Polysaccharide-23 05/31/2015  . Zoster Recombinat (Shingrix) 01/25/2018, 04/18/2018, 03/31/2019     Objective: Vital Signs: BP 90/61 (BP Location: Left Arm, Patient Position: Sitting, Cuff Size: Normal)   Pulse 93   Resp 15   Ht 5\' 3"  (1.6 m)   Wt 193 lb 6.4 oz (87.7 kg)   BMI 34.26 kg/m    Physical Exam Vitals and nursing note reviewed.  Constitutional:      Appearance: She is well-developed.  HENT:     Head: Normocephalic and atraumatic.  Eyes:     Conjunctiva/sclera: Conjunctivae normal.  Cardiovascular:     Rate and Rhythm: Normal rate and regular rhythm.     Heart sounds: Normal heart sounds.  Pulmonary:     Effort: Pulmonary effort is normal.     Breath sounds: Normal breath sounds.  Abdominal:     General: Bowel sounds are normal.     Palpations: Abdomen is soft.  Musculoskeletal:     Cervical back: Normal range of motion.  Lymphadenopathy:     Cervical: No cervical adenopathy.  Skin:    General: Skin is warm and dry.     Capillary Refill: Capillary refill takes less than 2 seconds.  Neurological:     Mental Status: She is alert and oriented to person, place, and time.  Psychiatric:        Behavior: Behavior normal.      Musculoskeletal Exam: C-spine was in good range of motion.  She has thoracic kyphosis.  She had discomfort in the lower lumbar paraspinal region.  She had good range of motion of  bilateral shoulders and elbows.  No tenderness was noted over wrist joint or MCPs.  She has bilateral PIP and DIP thickening.  She had bilateral CMC joint subluxation and tenderness.  Hip joints and knee joints with good range of motion.  Left ankle joint is fused. CDAI Exam: CDAI Score: -- Patient Global: --; Provider Global: -- Swollen: --; Tender: -- Joint Exam 10/31/2020   No joint exam has been documented for this visit   There is currently no information documented on the homunculus. Go to the Rheumatology activity and complete the homunculus joint exam.  Investigation: No additional findings.  Imaging: No results found.  Recent Labs: Lab Results  Component Value Date   WBC 4.2 10/16/2020   HGB 12.3 10/16/2020   PLT 121 (L) 10/16/2020   NA 143 09/17/2020   K 4.2 09/17/2020   CL 99 09/17/2020   CO2 27 09/17/2020   GLUCOSE 116 (H) 09/17/2020   BUN 44 (H) 09/17/2020   CREATININE 1.10 (H) 09/17/2020   BILITOT 0.5 09/17/2020   ALKPHOS 167 (H) 09/17/2020   AST 36 09/17/2020   ALT 18 09/17/2020   PROT 7.6 09/17/2020   ALBUMIN 4.2 09/17/2020   CALCIUM 9.9 09/17/2020   GFRAA 50 (L) 07/10/2020    Speciality Comments: No specialty comments available.  Procedures:  No procedures performed Allergies: Latex, Codeine, Levaquin [levofloxacin], and Tape   Assessment / Plan:     Visit Diagnoses: Primary osteoarthritis of both hands-she continues to have pain and stiffness in her hands.  The pain is mostly over her CMC joints.  Joint protection and brace was advised.  Rheumatoid factor positive-she had no synovitis on my examination.  Primary osteoarthritis of left hip-she states left hip pain is better although she has been having some discomfort in the right side.  Status post total knee replacement, right-doing well.  Primary osteoarthritis of left knee - she has severe osteoarthritis and has ongoing pain in her left knee joint.  She walks with the help of cane and  limps.  History of ankle fusion-she has left ankle joint fusion.  Chronic midline low back pain without sciatica-she has been experiencing some lower back pain.  I have given her a handout on back exercises and I will also refer her to physical therapy.  She will also benefit from lower extremity muscle strengthening.  She has unstable gait.  History of gout - Allopurinol 200 mg po daily.    Osteopenia, unspecified location - DEXA from 12/19/19  proximal left femur measures 0.674 gm/cm2.  The Z score is -0.1 and the T score is -2.2.  Essential hypertension  Carotid artery 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, bilateral - S/p carotid endarterectomy on 07/17/19.  She is followed by Dr. Lucky Cowboy.   Left ventricular hypertrophy  Double M peak gammopathy - She is followed by Dr. Grayland Ormond.  According to patient's daughter she has been discharged from hematology as her M spike has been stable.  History of COPD  History of hypothyroidism  Orders: Orders Placed This Encounter  Procedures  . Ambulatory referral to Physical Therapy   No orders of the defined types were placed in this encounter.   Follow-Up Instructions: Return for Osteoarthritis.   Bo Merino, MD  Note - This record has been created using Editor, commissioning.  Chart creation errors have been sought, but may not always  have been located. Such creation errors do not reflect on  the standard of medical care.

## 2020-10-22 ENCOUNTER — Other Ambulatory Visit: Payer: Self-pay

## 2020-10-22 ENCOUNTER — Encounter: Payer: Self-pay | Admitting: Oncology

## 2020-10-22 ENCOUNTER — Inpatient Hospital Stay (HOSPITAL_BASED_OUTPATIENT_CLINIC_OR_DEPARTMENT_OTHER): Payer: Medicare HMO | Admitting: Oncology

## 2020-10-22 DIAGNOSIS — D472 Monoclonal gammopathy: Secondary | ICD-10-CM | POA: Diagnosis not present

## 2020-10-22 DIAGNOSIS — D696 Thrombocytopenia, unspecified: Secondary | ICD-10-CM | POA: Diagnosis not present

## 2020-10-22 DIAGNOSIS — J449 Chronic obstructive pulmonary disease, unspecified: Secondary | ICD-10-CM | POA: Diagnosis not present

## 2020-10-22 DIAGNOSIS — J8410 Pulmonary fibrosis, unspecified: Secondary | ICD-10-CM | POA: Diagnosis not present

## 2020-10-22 NOTE — Progress Notes (Signed)
Patient scheduled for virtual visit today, follow up regarding MGUS. Patient denies any concerns today.

## 2020-10-31 ENCOUNTER — Other Ambulatory Visit: Payer: Self-pay

## 2020-10-31 ENCOUNTER — Ambulatory Visit: Payer: Medicare HMO | Admitting: Rheumatology

## 2020-10-31 ENCOUNTER — Encounter: Payer: Self-pay | Admitting: Rheumatology

## 2020-10-31 VITALS — BP 90/61 | HR 93 | Resp 15 | Ht 63.0 in | Wt 193.4 lb

## 2020-10-31 DIAGNOSIS — M19042 Primary osteoarthritis, left hand: Secondary | ICD-10-CM

## 2020-10-31 DIAGNOSIS — Z981 Arthrodesis status: Secondary | ICD-10-CM

## 2020-10-31 DIAGNOSIS — Z96651 Presence of right artificial knee joint: Secondary | ICD-10-CM | POA: Diagnosis not present

## 2020-10-31 DIAGNOSIS — M1612 Unilateral primary osteoarthritis, left hip: Secondary | ICD-10-CM | POA: Diagnosis not present

## 2020-10-31 DIAGNOSIS — M8580 Other specified disorders of bone density and structure, unspecified site: Secondary | ICD-10-CM | POA: Diagnosis not present

## 2020-10-31 DIAGNOSIS — M545 Low back pain, unspecified: Secondary | ICD-10-CM

## 2020-10-31 DIAGNOSIS — M1712 Unilateral primary osteoarthritis, left knee: Secondary | ICD-10-CM

## 2020-10-31 DIAGNOSIS — M19041 Primary osteoarthritis, right hand: Secondary | ICD-10-CM | POA: Diagnosis not present

## 2020-10-31 DIAGNOSIS — R768 Other specified abnormal immunological findings in serum: Secondary | ICD-10-CM | POA: Diagnosis not present

## 2020-10-31 DIAGNOSIS — Z8709 Personal history of other diseases of the respiratory system: Secondary | ICD-10-CM

## 2020-10-31 DIAGNOSIS — Z8639 Personal history of other endocrine, nutritional and metabolic disease: Secondary | ICD-10-CM

## 2020-10-31 DIAGNOSIS — Z8739 Personal history of other diseases of the musculoskeletal system and connective tissue: Secondary | ICD-10-CM | POA: Diagnosis not present

## 2020-10-31 DIAGNOSIS — G8929 Other chronic pain: Secondary | ICD-10-CM

## 2020-10-31 DIAGNOSIS — I6523 Occlusion and stenosis of bilateral carotid arteries: Secondary | ICD-10-CM

## 2020-10-31 DIAGNOSIS — I10 Essential (primary) hypertension: Secondary | ICD-10-CM

## 2020-10-31 DIAGNOSIS — D472 Monoclonal gammopathy: Secondary | ICD-10-CM

## 2020-10-31 DIAGNOSIS — I517 Cardiomegaly: Secondary | ICD-10-CM

## 2020-10-31 NOTE — Patient Instructions (Signed)

## 2020-11-06 ENCOUNTER — Other Ambulatory Visit: Payer: Self-pay

## 2020-11-06 DIAGNOSIS — M153 Secondary multiple arthritis: Secondary | ICD-10-CM

## 2020-11-06 MED ORDER — TIZANIDINE HCL 4 MG PO TABS: 4.0000 mg | ORAL_TABLET | Freq: Two times a day (BID) | ORAL | 1 refills | Status: DC

## 2020-11-15 ENCOUNTER — Other Ambulatory Visit: Payer: Self-pay

## 2020-11-15 DIAGNOSIS — M1612 Unilateral primary osteoarthritis, left hip: Secondary | ICD-10-CM

## 2020-11-15 DIAGNOSIS — M064 Inflammatory polyarthropathy: Secondary | ICD-10-CM

## 2020-11-15 DIAGNOSIS — F321 Major depressive disorder, single episode, moderate: Secondary | ICD-10-CM

## 2020-11-15 MED ORDER — MIRTAZAPINE 7.5 MG PO TABS: 11.2500 mg | ORAL_TABLET | Freq: Every day | ORAL | 2 refills | Status: DC

## 2020-11-15 MED ORDER — IBUPROFEN 600 MG PO TABS: 600.0000 mg | ORAL_TABLET | Freq: Three times a day (TID) | ORAL | 1 refills | Status: DC | PRN

## 2020-11-19 ENCOUNTER — Other Ambulatory Visit: Payer: Self-pay | Admitting: Nurse Practitioner

## 2020-11-19 DIAGNOSIS — Z8739 Personal history of other diseases of the musculoskeletal system and connective tissue: Secondary | ICD-10-CM

## 2020-11-20 ENCOUNTER — Other Ambulatory Visit: Payer: Medicare HMO

## 2020-11-20 NOTE — Telephone Encounter (Signed)
Next appointment with me is 6/14

## 2020-11-26 ENCOUNTER — Encounter: Payer: Self-pay | Admitting: Nurse Practitioner

## 2020-11-26 ENCOUNTER — Ambulatory Visit (INDEPENDENT_AMBULATORY_CARE_PROVIDER_SITE_OTHER): Payer: Medicare HMO | Admitting: Nurse Practitioner

## 2020-11-26 ENCOUNTER — Other Ambulatory Visit: Payer: Self-pay

## 2020-11-26 VITALS — BP 133/82 | HR 103 | Temp 97.0°F | Ht 63.0 in | Wt 196.1 lb

## 2020-11-26 DIAGNOSIS — F5102 Adjustment insomnia: Secondary | ICD-10-CM

## 2020-11-26 DIAGNOSIS — F321 Major depressive disorder, single episode, moderate: Secondary | ICD-10-CM

## 2020-11-26 DIAGNOSIS — I10 Essential (primary) hypertension: Secondary | ICD-10-CM | POA: Diagnosis not present

## 2020-11-26 DIAGNOSIS — N289 Disorder of kidney and ureter, unspecified: Secondary | ICD-10-CM | POA: Diagnosis not present

## 2020-11-26 DIAGNOSIS — Z9889 Other specified postprocedural states: Secondary | ICD-10-CM

## 2020-11-26 DIAGNOSIS — M158 Other polyosteoarthritis: Secondary | ICD-10-CM

## 2020-11-26 DIAGNOSIS — Z1231 Encounter for screening mammogram for malignant neoplasm of breast: Secondary | ICD-10-CM | POA: Diagnosis not present

## 2020-11-26 MED ORDER — MIRTAZAPINE 7.5 MG PO TABS
7.5000 mg | ORAL_TABLET | Freq: Every day | ORAL | 2 refills | Status: DC
Start: 2020-11-26 — End: 2021-01-27

## 2020-11-26 MED ORDER — OXYCODONE ER 13.5 MG PO C12A: 13.5000 mg | EXTENDED_RELEASE_CAPSULE | Freq: Two times a day (BID) | ORAL | 0 refills | Status: DC | PRN

## 2020-11-26 MED ORDER — TRAZODONE HCL 50 MG PO TABS: 25.0000 mg | ORAL_TABLET | Freq: Every evening | ORAL | 2 refills | Status: DC | PRN

## 2020-11-26 MED ORDER — OXYCODONE HCL 10 MG PO TABS: 10.0000 mg | ORAL_TABLET | Freq: Every day | ORAL | 0 refills | Status: DC | PRN

## 2020-11-26 NOTE — Progress Notes (Signed)
Established Patient Office Visit  Subjective:  Patient ID: Martha Peterson, female    DOB: 02/01/1941  Age: 80 y.o. MRN: [id number]  CC:  Chief Complaint  Patient presents with   Follow-up    HPI Martha Peterson presents for follow up visit. Further adjust her treatment for chronic pain at her most recent visit. Increased Oxycodone ER 13.73m capsules to twice daily to keep pain at manageable level. She can take oxycodone daily if needed for breakthrough pain. She states that she generally takes 1/2 tablet at times when needed. She states that she has done quite well with this alteration in dosing. She has tolerated the medication well and has no negative side effects to report. Her PDMP profile was reviewed. Her Overrdose Risk  Score is 240 and her fill history is appropriate.  She states that she continues to have trouble sleeping since the death of her husband. Initially remeron 7.568mevery evening was added. Was increased to 1559mvery evening when this was not effective. This worked ok, but then has stopped helping. States that it makes her feel sluggish and she believes it is leading to unwanted weight gain. Check of her renal functions showed some improvement of her renal functions. She is not taking allopurinol for now. She has not had a gout flare despite stopping her allopurinol. Will recheck renal functions at next visit and will consider getting renal ultrasound based on the results of those results.  She denies new problems or concerns today. She denies chest pain, chest pressure, or shortness of breath. She denies headaches or visual disturbances. She denies abdominal pain, nausea, vomiting, or changes in bowel or bladder habits.     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   Microhematuria    Mild mitral regurgitation    Mild tricuspid regurgitation    Osteopenia    Pulmonary fibrosis (HCC)    Urinary incontinence     Past Surgical History:  Procedure Laterality Date   ABDOMINAL HYSTERECTOMY     ANKLE SURGERY Left    APPENDECTOMY     CAROTID PTA/STENT INTERVENTION N/A 07/17/2019   Procedure: CAROTID PTA/STENT INTERVENTION;  Surgeon: DewAlgernon HuxleyD;  [facility];  Service: Cardiovascular;  Laterality: N/A;   CAROTID-SUBCLAVIAN BYPASS GRAFT N/A 07/26/2019   Procedure: BYPASS GRAFT CAROTID-SUBCLAVIAN ( CAROTID TO CAROTID BYPASS);  Surgeon: DewAlgernon HuxleyD;  Location: [facility];  Service: Vascular;  Laterality: N/A;   CHOLECYSTECTOMY     COLONOSCOPY WITH PROPOFOL N/A 12/04/2014   Procedure: COLONOSCOPY WITH PROPOFOL;  Surgeon: MarLollie SailsD;  [facility];  Service: Endoscopy;  Laterality: N/A;   FRACTURE SURGERY     Femur Fx; LT Ankle Pinning   JOINT REPLACEMENT     RT TKR   Patial Thyroid Resection      Family History  Problem Relation Age of Onset   Heart Problems Mother    Heart Problems Father    Diabetes Father    Lupus Sister    Thyroid disease Sister    Heart Problems Sister    Hypertension Son    Diabetes Son  borderline    Arthritis Daughter        in the left knee    Diabetes Daughter    Neuropathy Daughter    Depression Daughter    Anxiety disorder Daughter     Social History   Socioeconomic History   Marital status: Widowed    Spouse name: Jeneen Rinks   Number of children: Not on file   Years of education: Not on file   Highest education level: Not on file  Occupational History   Not on file  Tobacco Use   Smoking status: Former    Years: 1.00    Pack years: 0.00    Types: Cigarettes   Smokeless tobacco: Never  Vaping Use   Vaping Use: Never used  Substance and Sexual Activity   Alcohol use: No    Drug use: No   Sexual activity: Not Currently  Other Topics Concern   Not on file  Social History Narrative   Lives at home with husband in private residence   Social Determinants of Health   Financial Resource Strain: Not on file  Food Insecurity: Not on file  Transportation Needs: Not on file  Physical Activity: Not on file  Stress: Not on file  Social Connections: Not on file  Intimate Partner Violence: Not on file    Outpatient Medications Prior to Visit  Medication Sig Dispense Refill   allopurinol (ZYLOPRIM) 100 MG tablet TAKE 2 TABLETS EVERY DAY 180 tablet 1   apixaban (ELIQUIS) 5 MG TABS tablet Take 1 tablet (5 mg total) by mouth 2 (two) times daily. 60 tablet 5   ascorbic acid (VITAMIN C) 500 MG tablet Take 500 mg by mouth daily.     aspirin EC 81 MG tablet Take 81 mg by mouth daily.     atorvastatin (LIPITOR) 10 MG tablet Take 1 tablet (10 mg total) by mouth daily at 6 PM. 90 tablet 1   carvedilol (COREG) 6.25 MG tablet Take 1 tablet (6.25 mg total) by mouth 2 (two) times daily with a meal. 60 tablet 5   cholecalciferol (VITAMIN D) 400 UNITS TABS tablet Take 1,000 Units by mouth.     diclofenac sodium (VOLTAREN) 1 % GEL Apply 3 grams to 3 large joints, up to 3 times daily as needed. 3 Tube 3   fluticasone (FLONASE) 50 MCG/ACT nasal spray Place 2 sprays into both nostrils daily. (Patient taking differently: Place 2 sprays into both nostrils as needed.) 16 g 6   Ginger, Zingiber officinalis, (GINGER PO) Take by mouth.     ibuprofen (ADVIL) 600 MG tablet Take 1 tablet (600 mg total) by mouth every 8 (eight) hours as needed. 60 tablet 1   levothyroxine (SYNTHROID) 137 MCG tablet Take 1 tablet (137 mcg total) by mouth daily before breakfast. 90 tablet 1   losartan-hydrochlorothiazide (HYZAAR) 100-25 MG tablet Take 1 tablet by mouth daily. 90 tablet 1   magnesium oxide (MAG-OX) 400 MG tablet Take 400 mg by mouth daily.      Misc Natural Products (TART CHERRY ADVANCED PO) Take  by mouth daily.     Multiple Vitamins-Minerals (PRESERVISION AREDS 2 PO) Take by mouth 2 (two) times daily.     omega-3 acid ethyl esters (LOVAZA) 1 G capsule Take 1 g by mouth 2 (two) times daily.     potassium chloride (KLOR-CON) 10 MEQ tablet Take 1 tablet (10 mEq total) by mouth daily. 90 tablet 1   predniSONE (DELTASONE) 5 MG tablet Take 2 tablets (10 mg  total) by mouth daily with breakfast. (Patient taking differently: Take 10 mg by mouth as needed.) 180 tablet 1   tiZANidine (ZANAFLEX) 4 MG tablet Take 1 tablet (4 mg total) by mouth 2 (two) times daily. 90 tablet 1   torsemide (DEMADEX) 20 MG tablet Take 1 tablet (20 mg total) by mouth daily. 90 tablet 1   TURMERIC PO Take by mouth daily.     vitamin E 400 UNIT capsule Take 800 Units by mouth daily.      mirtazapine (REMERON) 7.5 MG tablet Take 1.5 tablets (11.25 mg total) by mouth at bedtime. 45 tablet 2   oxyCODONE ER 13.5 MG C12A Take 13.5 mg by mouth 2 (two) times daily as needed. 60 capsule 0   Oxycodone HCl 10 MG TABS Take 1 tablet (10 mg total) by mouth daily as needed. 30 tablet 0   No facility-administered medications prior to visit.    Allergies  Allergen Reactions   Latex Hives, Itching and Rash   Codeine    Levaquin [Levofloxacin]    Tape     latex    ROS Review of Systems  Constitutional:  Positive for fatigue. Negative for activity change, chills and fever.  HENT:  Negative for congestion, postnasal drip, rhinorrhea, sinus pressure and sinus pain.   Eyes: Negative.   Respiratory:  Negative for cough, chest tightness, shortness of breath and wheezing.   Cardiovascular:  Negative for chest pain and palpitations.  Gastrointestinal:  Negative for constipation, diarrhea, nausea and vomiting.  Endocrine: Negative for cold intolerance, heat intolerance, polydipsia and polyuria.  Musculoskeletal:  Positive for arthralgias, back pain, joint swelling and myalgias.       Back pain and generalized joint pain well managed  with current medication. She has tolerated this well and has no negative side effects.   Skin:  Negative for rash.  Allergic/Immunologic: Negative.   Neurological:  Negative for dizziness, weakness and headaches.  Hematological:        Continued trouble with sleeping since the death of her husband. Increased dose Remeron not really working.   Psychiatric/Behavioral:  Positive for dysphoric mood and sleep disturbance. The patient is nervous/anxious.      Objective:    Physical Exam Vitals and nursing note reviewed.  Constitutional:      Appearance: Normal appearance. She is well-developed. She is obese.  HENT:     Head: Normocephalic and atraumatic.     Nose: Nose normal.     Mouth/Throat:     Mouth: Mucous membranes are moist.  Eyes:     Extraocular Movements: Extraocular movements intact.     Conjunctiva/sclera: Conjunctivae normal.     Pupils: Pupils are equal, round, and reactive to light.  Neck:     Vascular: No carotid bruit.  Cardiovascular:     Rate and Rhythm: Normal rate and regular rhythm.     Pulses: Normal pulses.     Heart sounds: Normal heart sounds.  Pulmonary:     Effort: Pulmonary effort is normal.     Breath sounds: Normal breath sounds.  Abdominal:     General: Bowel sounds are normal.     Palpations: Abdomen is soft.     Tenderness: There is no abdominal tenderness.  Musculoskeletal:        General: Normal range of motion.     Cervical back: Normal range of motion and neck supple.     Comments: Moderate lower back pain, worse with bending and twisting at the waist. Exertion increases  pain. Left foot deformity with swelling present and unchanged. ROM and strength of the left foot is diminished but at baseline.     Lymphadenopathy:     Cervical: No cervical adenopathy.  Skin:    General: Skin is warm and dry.     Capillary Refill: Capillary refill takes less than 2 seconds.  Neurological:     General: No focal deficit present.     Mental Status: She  is alert and oriented to person, place, and time.  Psychiatric:        Mood and Affect: Mood normal.        Behavior: Behavior normal.        Thought Content: Thought content normal.        Judgment: Judgment normal.    Today's Vitals   11/26/20 1543  BP: 133/82  Pulse: (!) 103  Temp: (!) 97 F (36.1 C)  SpO2: 97%  Weight: 196 lb 1.9 oz (89 kg)   Body mass index is 34.74 kg/m.   Wt Readings from Last 3 Encounters:  11/26/20 196 lb 1.9 oz (89 kg)  10/31/20 193 lb 6.4 oz (87.7 kg)  09/25/20 194 lb 1.6 oz (88 kg)     Health Maintenance Due  Topic Date Due   TETANUS/TDAP  Never done   COVID-19 Vaccine (4 - Booster for Moderna series) 10/22/2020    There are no preventive care reminders to display for this patient.  Lab Results  Component Value Date   TSH 1.650 04/11/2020   Lab Results  Component Value Date   WBC 4.2 10/16/2020   HGB 12.3 10/16/2020   HCT 37.5 10/16/2020   MCV 95.9 10/16/2020   PLT 121 (L) 10/16/2020   Lab Results  Component Value Date   NA 143 09/17/2020   K 4.2 09/17/2020   CO2 27 09/17/2020   GLUCOSE 116 (H) 09/17/2020   BUN 44 (H) 09/17/2020   CREATININE 1.10 (H) 09/17/2020   BILITOT 0.5 09/17/2020   ALKPHOS 167 (H) 09/17/2020   AST 36 09/17/2020   ALT 18 09/17/2020   PROT 7.6 09/17/2020   ALBUMIN 4.2 09/17/2020   CALCIUM 9.9 09/17/2020   ANIONGAP 12 10/16/2019   EGFR 50 (L) 09/17/2020   Lab Results  Component Value Date   CHOL 147 02/06/2020   Lab Results  Component Value Date   HDL 46 02/06/2020   Lab Results  Component Value Date   LDLCALC 77 02/06/2020   Lab Results  Component Value Date   TRIG 140 02/06/2020   No results found for: [facility] Lab Results  Component Value Date   HGBA1C 5.8 (A) 06/27/2020      Assessment & Plan:  1. Essential hypertension Blood pressure is stable. Continue bp medication as prescribed   2. S/P carotid endarterectomy Caroltid endarterectomy scar hs healed well. Patient  continues to follow up routinely with vein and vascular provider.   3. Abnormal renal function Most recent BUN and Creatinine improved. Will recheck at next visit. If worsening will get new ultrasound of kidneys for further evaluation.   4. Other osteoarthritis involving multiple joints Patient doing well with new dosing of pain medication. Continue Oxycodone ER 13.9m twice daily and oxycodone 167mdaily if needed for breakthrough pain. Two 30 day prescriptions were sent to her pharmacy today. Dates are 11/26/2020 and 12/24/2020. - oxyCODONE ER 13.5 MG C12A; Take 13.5 mg by mouth 2 (two) times daily as needed.  Dispense: 60 capsule; Refill: 0 - Oxycodone HCl 10  MG TABS; Take 1 tablet (10 mg total) by mouth daily as needed.  Dispense: 30 tablet; Refill: 0  5. Current moderate episode of major depressive disorder, unspecified whether recurrent (HCC) Decrease mirtazapine back to 7.7m tablets daily. Continue other antidepressant therapy as prescribed  - mirtazapine (REMERON) 7.5 MG tablet; Take 1 tablet (7.5 mg total) by mouth at bedtime.  Dispense: 30 tablet; Refill: 2  6. Adjustment insomnia Add trazodone 563mtablets at bedtime. May take 1/2 to 1 tablet at bedtime as needed for insomnia.  - traZODone (DESYREL) 50 MG tablet; Take 0.5-1 tablets (25-50 mg total) by mouth at bedtime as needed for sleep.  Dispense: 30 tablet; Refill: 2  7. Encounter for screening mammogram for malignant neoplasm of breast Screening mammogram ordered today.  - MM DIGITAL SCREENING BILATERAL; Future   Problem List Items Addressed This Visit       Cardiovascular and Mediastinum   Essential hypertension - Primary     Musculoskeletal and Integument   Degenerative joint disease involving multiple joints   Relevant Medications   oxyCODONE ER 13.5 MG C12A   Oxycodone HCl 10 MG TABS     Other   S/P carotid endarterectomy   Abnormal renal function   Encounter for screening mammogram for malignant neoplasm of  breast   Relevant Orders   MM DIGITAL SCREENING BILATERAL   Current moderate episode of major depressive disorder (HCC)   Relevant Medications   mirtazapine (REMERON) 7.5 MG tablet   traZODone (DESYREL) 50 MG tablet   Adjustment insomnia   Relevant Medications   traZODone (DESYREL) 50 MG tablet    Meds ordered this encounter  Medications   mirtazapine (REMERON) 7.5 MG tablet    Sig: Take 1 tablet (7.5 mg total) by mouth at bedtime.    Dispense:  30 tablet    Refill:  2    Will go back to 7.45m101mose. Add trazodone to help with sleep    Order Specific Question:   Supervising Provider    Answer:   METBeatrice Lecher[[id number]]   DISCONTD: oxyCODONE ER 13.5 MG C12A    Sig: Take 13.5 mg by mouth 2 (two) times daily as needed.    Dispense:  60 capsule    Refill:  0    Order Specific Question:   Supervising Provider    Answer:   METBeatrice Lecher[[id number]]   DISCONTD: Oxycodone HCl 10 MG TABS    Sig: Take 1 tablet (10 mg total) by mouth daily as needed.    Dispense:  30 tablet    Refill:  0    Order Specific Question:   Supervising Provider    Answer:   METBeatrice Lecher[[id number]]   traZODone (DESYREL) 50 MG tablet    Sig: Take 0.5-1 tablets (25-50 mg total) by mouth at bedtime as needed for sleep.    Dispense:  30 tablet    Refill:  2    Order Specific Question:   Supervising Provider    Answer:   METBeatrice Lecher[[id number]]   oxyCODONE ER 13.5 MG C12A    Sig: Take 13.5 mg by mouth 2 (two) times daily as needed.    Dispense:  60 capsule    Refill:  0    Fill on or after 12/24/2020    Order Specific Question:   Supervising Provider    Answer:   METBeatrice Lecher[[id number]]   Oxycodone HCl 10 MG TABS    Sig: Take 1 tablet (  10 mg total) by mouth daily as needed.    Dispense:  30 tablet    Refill:  0    Fill on or after 12/24/2020    Order Specific Question:   Supervising Provider    Answer:   Beatrice Lecher D [[id number]]    Follow-up: Return in about 2 months (around  01/26/2021) for mood, med refills - check BMP at time of visit .    Ronnell Freshwater, NP

## 2020-12-07 DIAGNOSIS — F5102 Adjustment insomnia: Secondary | ICD-10-CM | POA: Insufficient documentation

## 2020-12-29 ENCOUNTER — Other Ambulatory Visit: Payer: Self-pay | Admitting: Internal Medicine

## 2021-01-13 ENCOUNTER — Other Ambulatory Visit: Payer: Self-pay | Admitting: Internal Medicine

## 2021-01-13 DIAGNOSIS — M153 Secondary multiple arthritis: Secondary | ICD-10-CM

## 2021-01-15 ENCOUNTER — Other Ambulatory Visit: Payer: Self-pay

## 2021-01-16 ENCOUNTER — Telehealth: Payer: Self-pay | Admitting: Cardiology

## 2021-01-16 ENCOUNTER — Other Ambulatory Visit: Payer: Self-pay | Admitting: Nurse Practitioner

## 2021-01-16 ENCOUNTER — Other Ambulatory Visit: Payer: Self-pay | Admitting: Internal Medicine

## 2021-01-16 MED ORDER — APIXABAN 5 MG PO TABS: 5.0000 mg | ORAL_TABLET | Freq: Two times a day (BID) | ORAL | 5 refills | Status: DC

## 2021-01-16 NOTE — Telephone Encounter (Signed)
Refill Request.  

## 2021-01-16 NOTE — Telephone Encounter (Signed)
Prescription refill request for Eliquis received. Indication: Atrial fib Last office visit: 08/19/20  Agbor-Etang MD Scr: 1.10 on 09/17/20 Age: 80 Weight: 89kg  Based on above findings Eliquis '5mg'$  twice daily is the appropriate dose.  Refill approved.

## 2021-01-16 NOTE — Telephone Encounter (Signed)
*  STAT* If patient is at the pharmacy, call can be transferred to refill team.   1. Which medications need to be refilled? (please list name of each medication and dose if known) Eliquis '5mg'$ , 1 tablet twice a day  2. Which pharmacy/location (including street and city if local pharmacy) is medication to be sent to? [facility] pharmacy  3. Do they need a 30 day or 90 day supply? 90 day supply

## 2021-01-20 DIAGNOSIS — H353131 Nonexudative age-related macular degeneration, bilateral, early dry stage: Secondary | ICD-10-CM | POA: Diagnosis not present

## 2021-01-20 DIAGNOSIS — Z0100 Encounter for examination of eyes and vision without abnormal findings: Secondary | ICD-10-CM | POA: Diagnosis not present

## 2021-01-26 IMAGING — CT CT ANGIO NECK
3 of 4 series · 11 of 33 positions shown · IV contrast (omnipaque)
Comparison: None.

CLINICAL DATA: Carotid stenosis

EXAM:
CT ANGIOGRAPHY NECK
TECHNIQUE: Multidetector CT imaging of the neck was performed using the
standard protocol during bolus administration of intravenous
contrast. Multiplanar CT image reconstructions and MIPs were
obtained to evaluate the vascular anatomy. Carotid stenosis
measurements (when applicable) are obtained utilizing NASCET
criteria, using the distal internal carotid diameter as the
denominator.
CONTRAST:  75mL OMNIPAQUE IOHEXOL 350 MG/ML SOLN

[Series 6: cta neck cta neck (person_name) 2.00 · axial · 0.46mm/px · z∈[-693,-579]mm · 3 of 113 slices shown]
[im 29/113  soft-tissue]
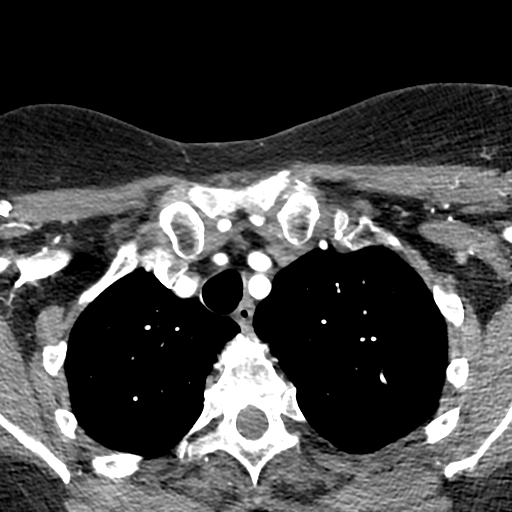
[im 57/113  soft-tissue]
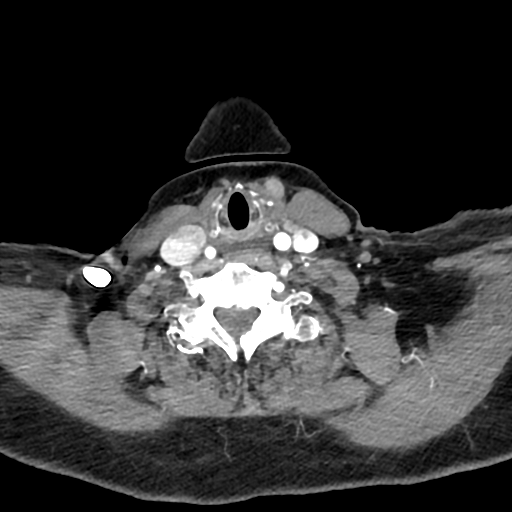
[im 85/113  soft-tissue]
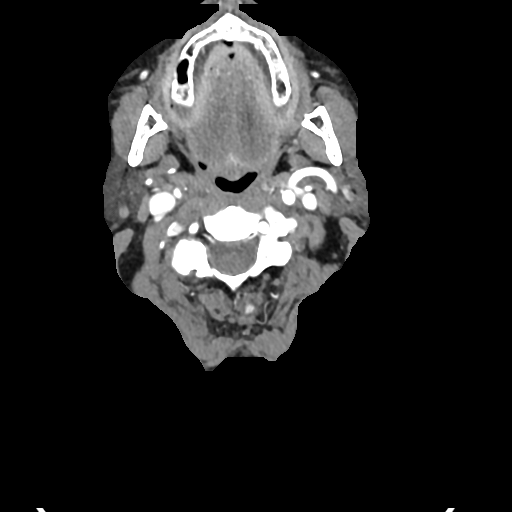

[Series 7: ax thin mips cta neck 1.00 ax · axial · 0.46mm/px · z∈[-722,-550]mm · 7 of 224 slices shown]
[im 28/224  soft-tissue]
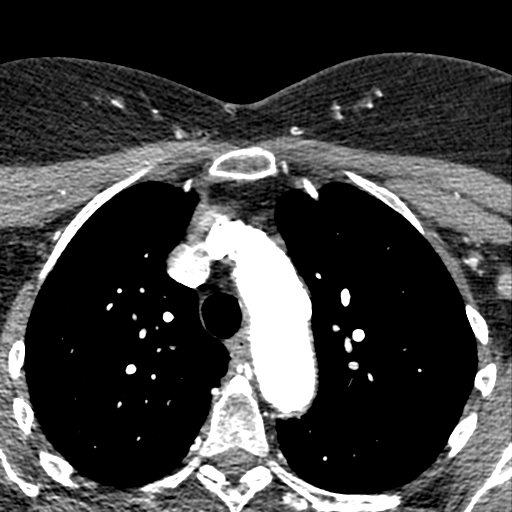
[im 56/224  bone]
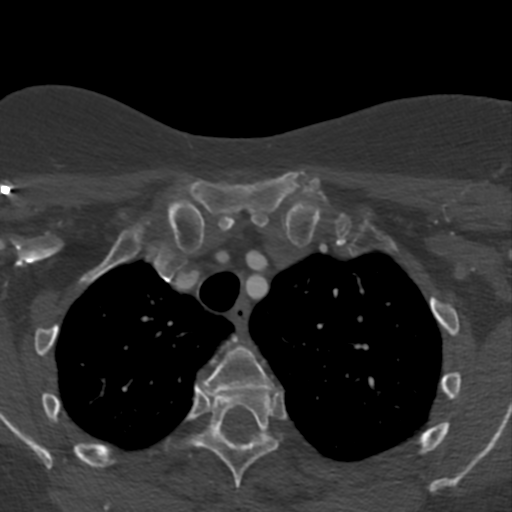
[im 84/224  soft-tissue]
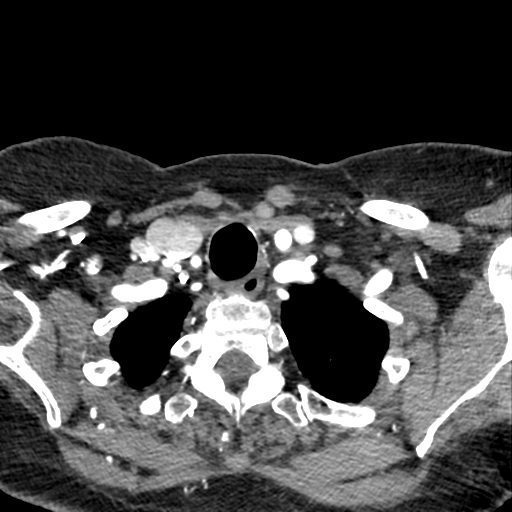
[im 112/224  bone]
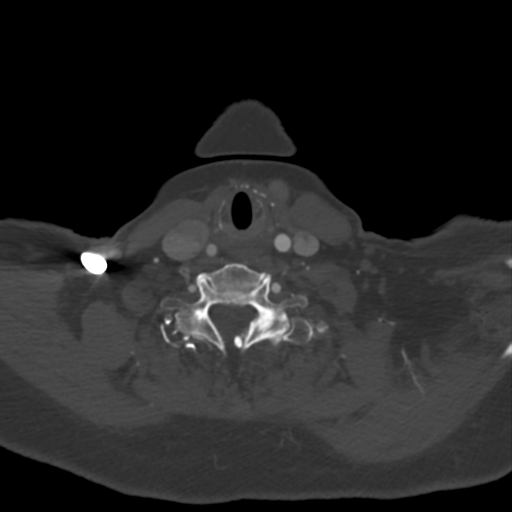
[im 140/224  soft-tissue]
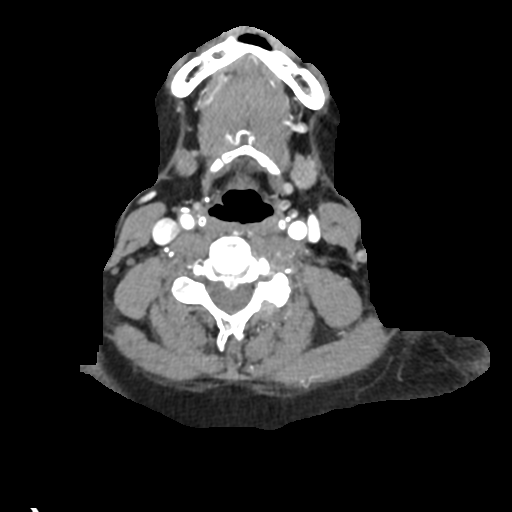
[im 168/224  bone]
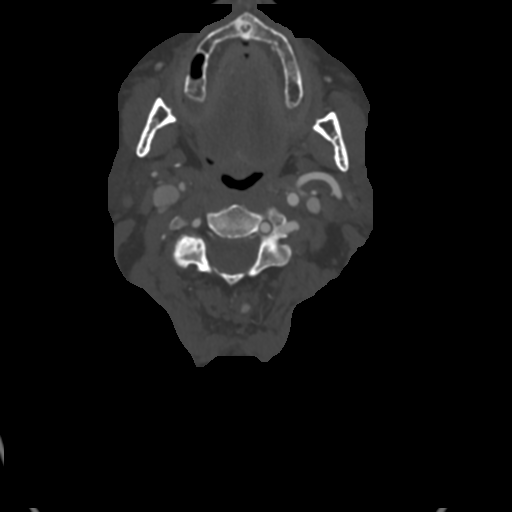
[im 196/224  soft-tissue]
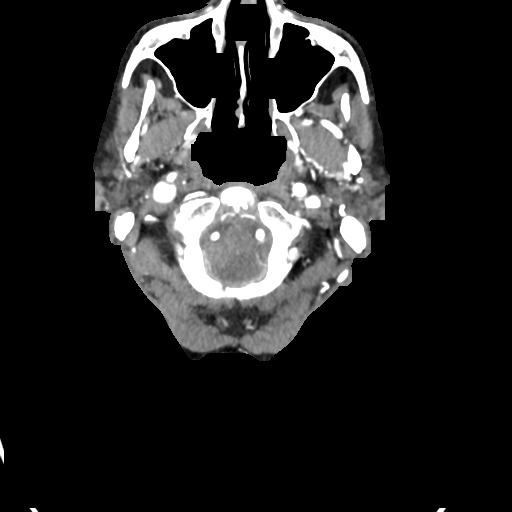

[Series 11: sag thin mips cta neck 1.00 sag · sagittal · 0.45mm/px · 1 of 236 slices shown]
[im 118/236  soft-tissue]
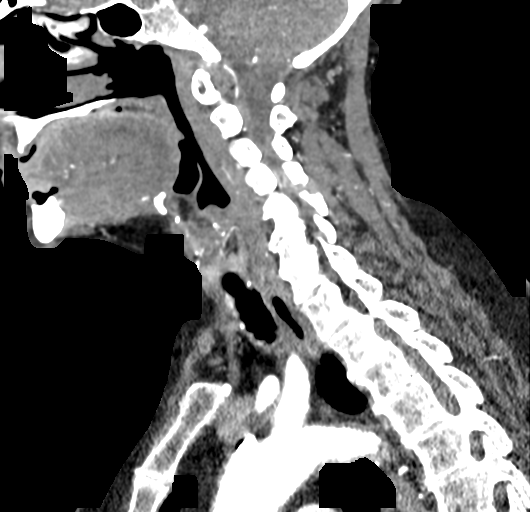

[11 of 33 positions shown; findings below may reference images not displayed]

FINDINGS: Aortic arch: Atherosclerotic calcification aortic arch. Extensive
calcification at the origin of the innominate artery with severe
stenosis. There is flow distal to the stenosis albeit with decreased
density compared to the left carotid artery indicating flow limiting
stenosis of the proximal innominate artery. Mild stenosis origin of
left common carotid artery. Left subclavian artery widely patent.

Right carotid system: Decreased density of the right carotid system
due to flow limiting stenosis of the innominate artery.
Atherosclerotic calcification right carotid bifurcation without
significant internal carotid artery stenosis.

Left carotid system: Mild stenosis origin of left common carotid
artery. Atherosclerotic calcification left carotid bulb without
significant stenosis.

Vertebral arteries: Moderate stenosis origin of right vertebral
artery which is then patent to the basilar.

Left vertebral artery widely patent without stenosis.

Skeleton: Cervical spondylosis.  No acute skeletal abnormality.

Other neck: Negative for mass or adenopathy in the neck.

Upper chest: Lung apices clear bilaterally.
IMPRESSION: 1. High-grade calcific stenosis innominate artery. Flow limiting
stenosis with decreased flow in the right carotid system.
2. Atherosclerotic calcification in the carotid bifurcation
bilaterally without significant stenosis
3. Moderate stenosis origin of right vertebral artery. Left
vertebral artery widely patent.

## 2021-01-27 ENCOUNTER — Ambulatory Visit (INDEPENDENT_AMBULATORY_CARE_PROVIDER_SITE_OTHER): Payer: Medicare HMO | Admitting: Nurse Practitioner

## 2021-01-27 ENCOUNTER — Other Ambulatory Visit: Payer: Self-pay

## 2021-01-27 ENCOUNTER — Encounter: Payer: Self-pay | Admitting: Nurse Practitioner

## 2021-01-27 VITALS — BP 111/66 | HR 97 | Temp 97.0°F | Ht 63.0 in | Wt 190.4 lb

## 2021-01-27 DIAGNOSIS — M791 Myalgia, unspecified site: Secondary | ICD-10-CM

## 2021-01-27 DIAGNOSIS — M158 Other polyosteoarthritis: Secondary | ICD-10-CM | POA: Diagnosis not present

## 2021-01-27 DIAGNOSIS — Z1231 Encounter for screening mammogram for malignant neoplasm of breast: Secondary | ICD-10-CM

## 2021-01-27 DIAGNOSIS — M153 Secondary multiple arthritis: Secondary | ICD-10-CM

## 2021-01-27 DIAGNOSIS — I6523 Occlusion and stenosis of bilateral carotid arteries: Secondary | ICD-10-CM

## 2021-01-27 DIAGNOSIS — I10 Essential (primary) hypertension: Secondary | ICD-10-CM

## 2021-01-27 DIAGNOSIS — J309 Allergic rhinitis, unspecified: Secondary | ICD-10-CM | POA: Diagnosis not present

## 2021-01-27 DIAGNOSIS — J0100 Acute maxillary sinusitis, unspecified: Secondary | ICD-10-CM

## 2021-01-27 DIAGNOSIS — F321 Major depressive disorder, single episode, moderate: Secondary | ICD-10-CM

## 2021-01-27 MED ORDER — CARVEDILOL 6.25 MG PO TABS: 6.2500 mg | ORAL_TABLET | Freq: Two times a day (BID) | ORAL | 1 refills | Status: DC

## 2021-01-27 MED ORDER — OXYCODONE HCL 10 MG PO TABS: 10.0000 mg | ORAL_TABLET | Freq: Every day | ORAL | 0 refills | Status: DC | PRN

## 2021-01-27 MED ORDER — PREDNISONE 5 MG PO TABS: 10.0000 mg | ORAL_TABLET | Freq: Every day | ORAL | 1 refills | Status: DC

## 2021-01-27 MED ORDER — MIRTAZAPINE 7.5 MG PO TABS: ORAL_TABLET | ORAL | 1 refills | Status: DC

## 2021-01-27 MED ORDER — TIZANIDINE HCL 4 MG PO TABS
4.0000 mg | ORAL_TABLET | Freq: Two times a day (BID) | ORAL | 1 refills | Status: DC
Start: 2021-01-27 — End: 2021-04-30

## 2021-01-27 MED ORDER — APIXABAN 5 MG PO TABS: 5.0000 mg | ORAL_TABLET | Freq: Two times a day (BID) | ORAL | 5 refills | Status: DC

## 2021-01-27 MED ORDER — OXYCODONE ER 13.5 MG PO C12A: 13.5000 mg | EXTENDED_RELEASE_CAPSULE | Freq: Two times a day (BID) | ORAL | 0 refills | Status: DC | PRN

## 2021-01-27 MED ORDER — LEVOCETIRIZINE DIHYDROCHLORIDE 5 MG PO TABS: 5.0000 mg | ORAL_TABLET | Freq: Every evening | ORAL | 1 refills | Status: DC

## 2021-01-27 NOTE — Telephone Encounter (Signed)
Eliquis mg refill request received. Patient is 80 years old, weight- 89 kg, Crea- 1.1 on 09/17/20 via epic, Diagnosis- afib, and last seen by Dr. Garen Lah on 08/19/20. Dose is appropriate based on dosing criteria. Will send in refill to requested pharmacy.

## 2021-01-27 NOTE — Progress Notes (Signed)
Established Patient Office Visit  Subjective:  Patient ID: Martha Peterson, female    DOB: 1941/03/29  Age: 80 y.o. MRN: [id number]  CC:  Chief Complaint  Patient presents with   Follow-up    HPI [name] presents for routine follow-up.  States that in general, she is doing well.  She states her chronic arthritic pain is currently well managed with pain medications at current doses. She is now taking oxycodone ER 13.49m capsules twice daily to keep pain at manageable level. She can take oxycodone daily if needed for breakthrough pain. She is prescribed a 113mdose. She states that she generally takes 1/2 tablet at times when needed. She states that she has done quite well with this dosing. She has no negative side effects to report. Her PDMP profile was reviewed today.  Her overdose risk score is 190.  The last fill of her oxycodone ER was 12/26/2020.  The last fill of oxycodone 10 mg tablets is also 12/26/2020.  Her fill history is appropriate.  [facility] and [facility]  She does need refills for these medications today. She has no new concerns or complaints today.  She denies chest pain, chest pressure, or shortness of breath. She denies headaches or visual disturbances. She denies abdominal pain, nausea, vomiting, or changes in bowel or bladder habits.  She does need refills routine medications.  [facility]  She is due for screening mammogram.  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   Microhematuria    Mild mitral regurgitation    Mild tricuspid regurgitation     Osteopenia    Pulmonary fibrosis (HCC)    Urinary incontinence     Past Surgical History:  Procedure Laterality Date   ABDOMINAL HYSTERECTOMY     ANKLE SURGERY Left    APPENDECTOMY     CAROTID PTA/STENT INTERVENTION N/A 07/17/2019   Procedure: CAROTID PTA/STENT INTERVENTION;  Surgeon: DeAlgernon HuxleyMD;  [facility];  Service: Cardiovascular;  Laterality: N/A;   CAROTID-SUBCLAVIAN BYPASS GRAFT N/A 07/26/2019   Procedure: BYPASS GRAFT CAROTID-SUBCLAVIAN ( CAROTID TO CAROTID BYPASS);  Surgeon: DeAlgernon HuxleyMD;  Location: [facility] ORS;  Service: Vascular;  Laterality: N/A;   CHOLECYSTECTOMY     COLONOSCOPY WITH PROPOFOL N/A 12/04/2014   Procedure: COLONOSCOPY WITH PROPOFOL;  Surgeon: MaLollie SailsMD;  [facility];  Service: Endoscopy;  Laterality: N/A;   FRACTURE SURGERY     Femur Fx; LT Ankle Pinning   JOINT REPLACEMENT     RT TKR   Patial Thyroid Resection      Family History  Problem Relation Age of Onset   Heart Problems Mother    Heart Problems Father    Diabetes Father    Lupus Sister    Thyroid disease Sister    Heart Problems Sister    Hypertension Son    Diabetes Son        borderline    Arthritis Daughter        in the left knee  Diabetes Daughter    Neuropathy Daughter    Depression Daughter    Anxiety disorder Daughter     Social History   Socioeconomic History   Marital status: Widowed    Spouse name: Jeneen Rinks   Number of children: Not on file   Years of education: Not on file   Highest education level: Not on file  Occupational History   Not on file  Tobacco Use   Smoking status: Former    Years: 1.00    Types: Cigarettes   Smokeless tobacco: Never  Vaping Use   Vaping Use: Never used  Substance and Sexual Activity   Alcohol use: No   Drug use: No   Sexual activity: Not Currently  Other Topics Concern   Not on file  Social History Narrative   Lives at home with husband in private residence   Social  Determinants of Health   Financial Resource Strain: Not on file  Food Insecurity: Not on file  Transportation Needs: Not on file  Physical Activity: Not on file  Stress: Not on file  Social Connections: Not on file  Intimate Partner Violence: Not on file    Outpatient Medications Prior to Visit  Medication Sig Dispense Refill   allopurinol (ZYLOPRIM) 100 MG tablet TAKE 2 TABLETS EVERY DAY 180 tablet 1   apixaban (ELIQUIS) 5 MG TABS tablet Take 1 tablet (5 mg total) by mouth 2 (two) times daily. 60 tablet 5   ascorbic acid (VITAMIN C) 500 MG tablet Take 500 mg by mouth daily.     aspirin EC 81 MG tablet Take 81 mg by mouth daily.     atorvastatin (LIPITOR) 10 MG tablet Take 1 tablet (10 mg total) by mouth daily at 6 PM. 90 tablet 1   cholecalciferol (VITAMIN D) 400 UNITS TABS tablet Take 1,000 Units by mouth.     diclofenac sodium (VOLTAREN) 1 % GEL Apply 3 grams to 3 large joints, up to 3 times daily as needed. 3 Tube 3   fluticasone (FLONASE) 50 MCG/ACT nasal spray Place 2 sprays into both nostrils daily. (Patient taking differently: Place 2 sprays into both nostrils as needed.) 16 g 6   Ginger, Zingiber officinalis, (GINGER PO) Take by mouth.     ibuprofen (ADVIL) 600 MG tablet Take 1 tablet (600 mg total) by mouth every 8 (eight) hours as needed. 60 tablet 1   levothyroxine (SYNTHROID) 137 MCG tablet Take 1 tablet (137 mcg total) by mouth daily before breakfast. 90 tablet 1   losartan-hydrochlorothiazide (HYZAAR) 100-25 MG tablet Take 1 tablet by mouth daily. 90 tablet 1   magnesium oxide (MAG-OX) 400 MG tablet Take 400 mg by mouth daily.      Misc Natural Products (TART CHERRY ADVANCED PO) Take by mouth daily.     Multiple Vitamins-Minerals (PRESERVISION AREDS 2 PO) Take by mouth 2 (two) times daily.     omega-3 acid ethyl esters (LOVAZA) 1 G capsule Take 1 g by mouth 2 (two) times daily.     potassium chloride (KLOR-CON) 10 MEQ tablet Take 1 tablet (10 mEq total) by mouth daily. 90  tablet 1   torsemide (DEMADEX) 20 MG tablet Take 1 tablet (20 mg total) by mouth daily. 90 tablet 1   traZODone (DESYREL) 50 MG tablet Take 0.5-1 tablets (25-50 mg total) by mouth at bedtime as needed for sleep. 30 tablet 2   TURMERIC PO Take by mouth daily.     vitamin E 400 UNIT capsule Take 800 Units  by mouth daily.      carvedilol (COREG) 6.25 MG tablet Take 1 tablet (6.25 mg total) by mouth 2 (two) times daily with a meal. 60 tablet 5   mirtazapine (REMERON) 7.5 MG tablet Take 1 tablet (7.5 mg total) by mouth at bedtime. 30 tablet 2   oxyCODONE ER 13.5 MG C12A Take 13.5 mg by mouth 2 (two) times daily as needed. 60 capsule 0   Oxycodone HCl 10 MG TABS Take 1 tablet (10 mg total) by mouth daily as needed. 30 tablet 0   predniSONE (DELTASONE) 5 MG tablet Take 2 tablets (10 mg total) by mouth daily with breakfast. (Patient taking differently: Take 10 mg by mouth as needed.) 180 tablet 1   tiZANidine (ZANAFLEX) 4 MG tablet Take 1 tablet (4 mg total) by mouth 2 (two) times daily. 90 tablet 1   No facility-administered medications prior to visit.    Allergies  Allergen Reactions   Latex Hives, Itching and Rash   Codeine    Levaquin [Levofloxacin]    Tape     latex    ROS Review of Systems  Constitutional:  Positive for fatigue. Negative for activity change, chills and fever.  HENT:  Negative for congestion, postnasal drip, rhinorrhea, sinus pressure and sinus pain.   Eyes: Negative.   Respiratory:  Negative for cough, chest tightness, shortness of breath and wheezing.   Cardiovascular:  Negative for chest pain and palpitations.  Gastrointestinal:  Negative for constipation, diarrhea, nausea and vomiting.  Endocrine: Negative for cold intolerance, heat intolerance, polydipsia and polyuria.  Musculoskeletal:  Positive for arthralgias, back pain, joint swelling and myalgias.       Back pain and generalized joint pain well managed with current medication. She has tolerated this well and  has no negative side effects.   Skin:  Negative for rash.  Allergic/Immunologic: Positive for environmental allergies.  Neurological:  Negative for dizziness, weakness and headaches.  Psychiatric/Behavioral:  Positive for dysphoric mood and sleep disturbance. The patient is nervous/anxious.        States that she sleeps better with slightly higher dose of mirtazapine.     Objective:    Physical Exam Vitals and nursing note reviewed.  Constitutional:      Appearance: Normal appearance. She is well-developed. She is obese.  HENT:     Head: Normocephalic and atraumatic.     Nose: Nose normal.     Mouth/Throat:     Mouth: Mucous membranes are moist.  Eyes:     Extraocular Movements: Extraocular movements intact.     Conjunctiva/sclera: Conjunctivae normal.     Pupils: Pupils are equal, round, and reactive to light.  Neck:     Vascular: No carotid bruit.  Cardiovascular:     Rate and Rhythm: Normal rate. Rhythm irregular.     Pulses: Normal pulses.     Heart sounds: Normal heart sounds.  Pulmonary:     Effort: Pulmonary effort is normal.     Breath sounds: Normal breath sounds.  Abdominal:     Palpations: Abdomen is soft.  Musculoskeletal:        General: Normal range of motion.     Cervical back: Normal range of motion and neck supple.     Comments: Moderate lower back pain, worse with bending and twisting at the waist. Exertion increases pain. Left foot deformity with swelling present and unchanged. ROM and strength of the left foot is diminished but at baseline.     Lymphadenopathy:  Cervical: No cervical adenopathy.  Skin:    General: Skin is warm and dry.     Capillary Refill: Capillary refill takes less than 2 seconds.  Neurological:     General: No focal deficit present.     Mental Status: She is alert and oriented to person, place, and time.  Psychiatric:        Mood and Affect: Mood normal.        Behavior: Behavior normal.        Thought Content: Thought  content normal.        Judgment: Judgment normal.    Today's Vitals   01/27/21 1345  BP: 111/66  Pulse: 97  Temp: (!) 97 F (36.1 C)  SpO2: 95%  Weight: 190 lb 6.4 oz (86.4 kg)   Body mass index is 33.73 kg/m.   Wt Readings from Last 3 Encounters:  01/27/21 190 lb 6.4 oz (86.4 kg)  11/26/20 196 lb 1.9 oz (89 kg)  10/31/20 193 lb 6.4 oz (87.7 kg)     Health Maintenance Due  Topic Date Due   TETANUS/TDAP  Never done   COVID-19 Vaccine (4 - Booster for Moderna series) 10/22/2020   INFLUENZA VACCINE  01/13/2021    There are no preventive care reminders to display for this patient.  Lab Results  Component Value Date   TSH 1.650 04/11/2020   Lab Results  Component Value Date   WBC 4.2 10/16/2020   HGB 12.3 10/16/2020   HCT 37.5 10/16/2020   MCV 95.9 10/16/2020   PLT 121 (L) 10/16/2020   Lab Results  Component Value Date   NA 143 09/17/2020   K 4.2 09/17/2020   CO2 27 09/17/2020   GLUCOSE 116 (H) 09/17/2020   BUN 44 (H) 09/17/2020   CREATININE 1.10 (H) 09/17/2020   BILITOT 0.5 09/17/2020   ALKPHOS 167 (H) 09/17/2020   AST 36 09/17/2020   ALT 18 09/17/2020   PROT 7.6 09/17/2020   ALBUMIN 4.2 09/17/2020   CALCIUM 9.9 09/17/2020   ANIONGAP 12 10/16/2019   EGFR 50 (L) 09/17/2020   Lab Results  Component Value Date   CHOL 147 02/06/2020   Lab Results  Component Value Date   HDL 46 02/06/2020   Lab Results  Component Value Date   LDLCALC 77 02/06/2020   Lab Results  Component Value Date   TRIG 140 02/06/2020   No results found for: [facility] Lab Results  Component Value Date   HGBA1C 5.8 (A) 06/27/2020      Assessment & Plan:  1. Essential hypertension Blood pressure stable with current medications.  New prescriptions sent to [facility].  She should continue to follow-up with cardiology and vascular surgery as scheduled.  2. 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  Continue to follow-up with vascular  surgery as scheduled.  3. Other osteoarthritis involving multiple joints Patient may continue with current pain medication regimen.  May continue oxycodone ER 13.5 mg capsules twice daily to regulate chronic pain.  Oxycodone 10 mg may be taken daily if needed for breakthrough pain. Two 30-day prescriptions were sent to her pharmacy today.  Dates are 01/27/2021 and was 02/25/2021. Controlled substance policy agreement was signed and will be scanned into her chart on 01/27/2021.  She does take prednisone 5 mg, 1 to 2 tablets daily.  Does depends on severity of symptoms.  This was started per rheumatology and continued by primary care.  Refills were sent to [facility]. - oxyCODONE ER 13.5  MG C12A; Take 13.5 mg by mouth 2 (two) times daily as needed.  Dispense: 60 capsule; Refill: 0 - Oxycodone HCl 10 MG TABS; Take 1 tablet (10 mg total) by mouth daily as needed.  Dispense: 30 tablet; Refill: 0 - predniSONE (DELTASONE) 5 MG tablet; Take 2 tablets (10 mg total) by mouth daily with breakfast.  Dispense: 180 tablet; Refill: 1   4. Muscle pain She may take tizanidine up to twice daily if needed for muscle pain. - tiZANidine (ZANAFLEX) 4 MG tablet; Take 1 tablet (4 mg total) by mouth 2 (two) times daily.  Dispense: 90 tablet; Refill: 1  5. Current moderate episode of major depressive disorder, unspecified whether recurrent ([place]) Improved and stable with mirtazapine.  May take 1 to 2 tablets every evening.  [facility] prescription was sent to [facility]. - mirtazapine (REMERON) 7.5 MG tablet; Take 1 to 2 tablets po QPM  Dispense: 180 tablet; Refill: 1  6. Chronic allergic rhinitis Continue Xyzal as prescribed. - levocetirizine (XYZAL) 5 MG tablet; Take 1 tablet (5 mg total) by mouth every evening.  Dispense: 90 tablet; Refill: 1  7. Encounter for screening mammogram for malignant neoplasm of breast Screening mammogram ordered today.    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, bilateral   Relevant Medications   carvedilol (COREG) 6.25 MG tablet     Respiratory   Chronic allergic rhinitis   Relevant Medications   levocetirizine (XYZAL) 5 MG tablet     Musculoskeletal and Integument   Degenerative joint disease involving multiple joints   Relevant Medications   oxyCODONE ER 13.5 MG C12A   Oxycodone HCl 10 MG TABS   tiZANidine (ZANAFLEX) 4 MG tablet   predniSONE (DELTASONE) 5 MG tablet     Other   Encounter for screening mammogram for malignant neoplasm of breast   Current moderate episode of major depressive disorder (HCC)   Relevant Medications   mirtazapine (REMERON) 7.5 MG tablet   Other Visit Diagnoses     Acute non-recurrent maxillary sinusitis       Relevant Medications   predniSONE (DELTASONE) 5 MG tablet   levocetirizine (XYZAL) 5 MG tablet       Meds ordered this encounter  Medications   DISCONTD: oxyCODONE ER 13.5 MG C12A    Sig: Take 13.5 mg by mouth 2 (two) times daily as needed.    Dispense:  60 capsule    Refill:  0    Order Specific Question:   Supervising Provider    Answer:   Beatrice Lecher D [[id number]]   DISCONTD: Oxycodone HCl 10 MG TABS    Sig: Take 1 tablet (10 mg total) by mouth daily as needed.    Dispense:  30 tablet    Refill:  0    Order Specific Question:   Supervising Provider    Answer:   Beatrice Lecher D [[id number]]   oxyCODONE ER 13.5 MG C12A    Sig: Take 13.5 mg by mouth 2 (two) times daily as needed.    Dispense:  60 capsule    Refill:  0    Fill on or after 02/25/2021    Order Specific Question:   Supervising Provider    Answer:   Beatrice Lecher D [[id number]]   Oxycodone HCl 10 MG TABS    Sig:  Take 1 tablet (10 mg total) by mouth daily as needed.    Dispense:  30 tablet    Refill:  0    Fill on or after 02/25/2021    Order Specific Question:    Supervising Provider    Answer:   Beatrice Lecher D [[id number]]   carvedilol (COREG) 6.25 MG tablet    Sig: Take 1 tablet (6.25 mg total) by mouth 2 (two) times daily with a meal.    Dispense:  180 tablet    Refill:  1    Order Specific Question:   Supervising Provider    Answer:   Beatrice Lecher D [[id number]]   mirtazapine (REMERON) 7.5 MG tablet    Sig: Take 1 to 2 tablets po QPM    Dispense:  180 tablet    Refill:  1    Patient taking one to 1.5 tablets and doing well    Order Specific Question:   Supervising Provider    Answer:   Beatrice Lecher D [[id number]]   tiZANidine (ZANAFLEX) 4 MG tablet    Sig: Take 1 tablet (4 mg total) by mouth 2 (two) times daily.    Dispense:  90 tablet    Refill:  1    Order Specific Question:   Supervising Provider    Answer:   Beatrice Lecher D [[id number]]   predniSONE (DELTASONE) 5 MG tablet    Sig: Take 2 tablets (10 mg total) by mouth daily with breakfast.    Dispense:  180 tablet    Refill:  1    Order Specific Question:   Supervising Provider    Answer:   Beatrice Lecher D [[id number]]   levocetirizine (XYZAL) 5 MG tablet    Sig: Take 1 tablet (5 mg total) by mouth every evening.    Dispense:  90 tablet    Refill:  1    Order Specific Question:   Supervising Provider    Answer:   Beatrice Lecher D [[id number]]   This note was dictated using Dragon Voice Recognition Software. Rapid proofreading was performed to expedite the delivery of the information. Despite proofreading, phonetic errors will occur which are common with this voice recognition software. Please take this into consideration. If there are any concerns, please contact our office.    Follow-up: Return in about 2 months (around 03/29/2021) for medicare wellness, fasting labs at time of visit, add HgbA1c, free T4 and vitamin d - needs UDS.    Ronnell Freshwater, NP

## 2021-01-27 NOTE — Telephone Encounter (Signed)
*  STAT* If patient is at the pharmacy, call can be transferred to refill team.   1. Which medications need to be refilled? (please list name of each medication and dose if known) Eliquis  2. Which pharmacy/location (including street and city if local pharmacy) is medication to be sent to? Eliquis  3. Do they need a 30 day or 90 day supply? [place]

## 2021-01-27 NOTE — Telephone Encounter (Signed)
Please review for refill, Thanks !  

## 2021-01-28 ENCOUNTER — Other Ambulatory Visit: Payer: Self-pay | Admitting: Nurse Practitioner

## 2021-01-28 DIAGNOSIS — Z1231 Encounter for screening mammogram for malignant neoplasm of breast: Secondary | ICD-10-CM

## 2021-02-01 DIAGNOSIS — J309 Allergic rhinitis, unspecified: Secondary | ICD-10-CM | POA: Insufficient documentation

## 2021-02-20 ENCOUNTER — Ambulatory Visit: Payer: Medicare HMO | Admitting: Nurse Practitioner

## 2021-02-24 ENCOUNTER — Encounter: Payer: Self-pay | Admitting: Cardiology

## 2021-02-24 ENCOUNTER — Ambulatory Visit: Payer: Medicare HMO | Admitting: Cardiology

## 2021-02-24 ENCOUNTER — Other Ambulatory Visit: Payer: Self-pay

## 2021-02-24 VITALS — BP 100/54 | HR 101 | Ht 63.0 in | Wt 191.0 lb

## 2021-02-24 DIAGNOSIS — I10 Essential (primary) hypertension: Secondary | ICD-10-CM | POA: Diagnosis not present

## 2021-02-24 DIAGNOSIS — I5189 Other ill-defined heart diseases: Secondary | ICD-10-CM | POA: Diagnosis not present

## 2021-02-24 DIAGNOSIS — I4891 Unspecified atrial fibrillation: Secondary | ICD-10-CM

## 2021-02-24 NOTE — Patient Instructions (Signed)

## 2021-02-24 NOTE — Progress Notes (Signed)
Cardiology Office Note:    Date:  02/24/2021   ID:  Martha Peterson, DOB 1940/10/28, MRN [id number]  PCP:  Ronnell Freshwater, NP  Cardiologist:  None  Electrophysiologist:  None   Referring MD: [name], MD   Chief Complaint  Patient presents with   Other    6 month follow up -- Meds reviewed verbally with patient.     History of Present Illness:    Martha Peterson is a 80 y.o. female with a hx of  atrial fibrillation, HFpEF, hypertension, hyperlipidemia, carotid stenosis, innominate artery stenosis who presents for follow-up.  Patient being seen for atrial fibrillation and diastolic dysfunction.  Tolerating Coreg and Eliquis as prescribed.  Takes torsemide for lower extremity edema, which patient states is improving.  Denies palpitations, shortness of breath.  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  Systolic usually [id number] at home.  Denies palpitations.  Prior notes Echo 09/2019 EF 60 to [id number], grade 2 diastolic dysfunction, severely dilated left atrium Patient has a history of mitral regurgitation being followed periodically with echocardiogram.  She has a known history of carotid stenosis with carotid bruit.CT angio of the neck showed high-grade calcific stenosis innominate artery, flow-limiting stenosis in the right carotid system.  There is sclerosis with calcification in the carotid bifurcation bilaterally without significant stenosis.  She is seeing vascular surgery for carotid artery disease.  She takes a baby aspirin and Lipitor 10 mg.  Has a history of liver abnormality hence the low-dose statin.     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   Microhematuria    Mild mitral regurgitation    Mild tricuspid regurgitation    Osteopenia    Pulmonary fibrosis ([facility])    Urinary incontinence     Past Surgical History:  Procedure Laterality Date   ABDOMINAL HYSTERECTOMY     ANKLE SURGERY Left    APPENDECTOMY     CAROTID PTA/STENT INTERVENTION N/A 07/17/2019   Procedure: CAROTID PTA/STENT INTERVENTION;  Surgeon: Algernon Huxley, MD;  Location: [facility];  Service: Cardiovascular;  Laterality: N/A;   CAROTID-SUBCLAVIAN BYPASS GRAFT N/A 07/26/2019   Procedure: BYPASS GRAFT CAROTID-SUBCLAVIAN ( CAROTID TO CAROTID BYPASS);  Surgeon: Algernon Huxley, MD;  Location: [facility];  Service: Vascular;  Laterality: N/A;   CHOLECYSTECTOMY     COLONOSCOPY WITH PROPOFOL N/A 12/04/2014   Procedure: COLONOSCOPY WITH PROPOFOL;  Surgeon: Lollie Sails, MD;  Location[facility];  Service: Endoscopy;  Laterality: N/A;   FRACTURE SURGERY     Femur Fx; LT Ankle Pinning   JOINT REPLACEMENT     RT TKR   Patial Thyroid Resection      Current Medications: Current Meds  Medication Sig   allopurinol (ZYLOPRIM) 100 MG tablet TAKE 2 TABLETS EVERY DAY   apixaban (ELIQUIS) 5 MG TABS tablet Take 1 tablet (5 mg total) by mouth 2 (two) times daily.   ascorbic acid (VITAMIN C) 500 MG tablet Take 500 mg by mouth daily.   aspirin EC 81 MG tablet Take 81 mg by mouth daily.   atorvastatin (LIPITOR) 10  MG tablet Take 1 tablet (10 mg total) by mouth daily at 6 PM.   carvedilol (COREG) 6.25 MG tablet Take 1 tablet (6.25 mg total) by mouth 2 (two) times daily with a meal.   cholecalciferol (VITAMIN D) 400 UNITS TABS tablet Take 1,000 Units by mouth.   diclofenac sodium (VOLTAREN) 1 % GEL Apply 3 grams to 3 large joints, up to 3 times daily as needed.   fluticasone (FLONASE) 50 MCG/ACT nasal spray Place 2 sprays into both nostrils daily. (Patient taking differently: Place 2 sprays into both nostrils as needed.)   Ginger, Zingiber  officinalis, (GINGER PO) Take by mouth.   ibuprofen (ADVIL) 600 MG tablet Take 1 tablet (600 mg total) by mouth every 8 (eight) hours as needed.   levocetirizine (XYZAL) 5 MG tablet Take 1 tablet (5 mg total) by mouth every evening.   levothyroxine (SYNTHROID) 137 MCG tablet Take 1 tablet (137 mcg total) by mouth daily before breakfast.   losartan-hydrochlorothiazide (HYZAAR) 100-25 MG tablet Take 1 tablet by mouth daily.   magnesium oxide (MAG-OX) 400 MG tablet Take 400 mg by mouth daily.    mirtazapine (REMERON) 7.5 MG tablet Take 1 to 2 tablets po QPM   Misc Natural Products (TART CHERRY ADVANCED PO) Take by mouth daily.   Multiple Vitamins-Minerals (PRESERVISION AREDS 2 PO) Take by mouth 2 (two) times daily.   omega-3 acid ethyl esters (LOVAZA) 1 G capsule Take 1 g by mouth 2 (two) times daily.   oxyCODONE ER 13.5 MG C12A Take 13.5 mg by mouth 2 (two) times daily as needed.   Oxycodone HCl 10 MG TABS Take 1 tablet (10 mg total) by mouth daily as needed.   potassium chloride (KLOR-CON) 10 MEQ tablet Take 1 tablet (10 mEq total) by mouth daily.   predniSONE (DELTASONE) 5 MG tablet Take 2 tablets (10 mg total) by mouth daily with breakfast.   tiZANidine (ZANAFLEX) 4 MG tablet Take 1 tablet (4 mg total) by mouth 2 (two) times daily.   torsemide (DEMADEX) 20 MG tablet Take 1 tablet (20 mg total) by mouth daily.   traZODone (DESYREL) 50 MG tablet Take 0.5-1 tablets (25-50 mg total) by mouth at bedtime as needed for sleep.   TURMERIC PO Take by mouth daily.   vitamin E 400 UNIT capsule Take 800 Units by mouth daily.      Allergies:   Latex, Codeine, Levaquin [levofloxacin], and Tape   Social History   Socioeconomic History   Marital status: Widowed    Spouse name: Jeneen Rinks   Number of children: Not on file   Years of education: Not on file   Highest education level: Not on file  Occupational History   Not on file  Tobacco Use   Smoking status: Former    Years: 1.00    Types: Cigarettes    Smokeless tobacco: Never  Vaping Use   Vaping Use: Never used  Substance and Sexual Activity   Alcohol use: No   Drug use: No   Sexual activity: Not Currently  Other Topics Concern   Not on file  Social History Narrative   Lives at home with husband in private residence   Social Determinants of Health   Financial Resource Strain: Not on file  Food Insecurity: Not on file  Transportation Needs: Not on file  Physical Activity: Not on file  Stress: Not on file  Social Connections: Not on file     Family History: The patient's family history includes Anxiety disorder  in her daughter; Arthritis in her daughter; Depression in her daughter; Diabetes in her daughter, father, and son; Heart Problems in her father, mother, and sister; Hypertension in her son; Lupus in her sister; Neuropathy in her daughter; Thyroid disease in her sister.  ROS:   Please see the history of present illness.     All other systems reviewed and are negative.  EKGs/Labs/Other Studies Reviewed:      EKG:  EKG is  ordered today.  EKG shows atrial fibrillation, heart rate 101  Recent Labs: 04/11/2020: TSH 1.650 09/17/2020: ALT 18; BUN 44; Creatinine, Ser 1.10; Potassium 4.2; Sodium 143 10/16/2020: Hemoglobin 12.3; Platelets 121  Recent Lipid Panel    Component Value Date/Time   CHOL 147 02/06/2020 1221   TRIG 140 02/06/2020 1221   HDL 46 02/06/2020 1221   LDLCALC 77 02/06/2020 1221    Physical Exam:    VS:  BP (!) 100/54 (BP Location: Right Arm, Patient Position: Sitting, Cuff Size: Normal)   Pulse (!) 101   Ht '5\' 3"'$  (1.6 m)   Wt 191 lb (86.6 kg)   SpO2 95%   BMI 33.83 kg/m     Wt Readings from Last 3 Encounters:  02/24/21 191 lb (86.6 kg)  01/27/21 190 lb 6.4 oz (86.4 kg)  11/26/20 196 lb 1.9 oz (89 kg)     GEN:  Well nourished, well developed in no acute distress HEENT: Normal NECK: No JVD; bilateral carotid bruits noted LYMPHATICS: No lymphadenopathy CARDIAC: irregular irregular, 2/6  systolic murmur noted, RESPIRATORY:  Clear to auscultation without rales, wheezing or rhonchi  ABDOMEN: Soft, non-tender, non-distended MUSCULOSKELETAL:1+ edema; varicose veins noted SKIN: Warm and dry NEUROLOGIC:  Alert and oriented x 3 PSYCHIATRIC:  Normal affect   ASSESSMENT:    1. Atrial fibrillation, unspecified type ([place])   2. Grade II diastolic dysfunction   3. Primary hypertension     PLAN:    In order of problems listed above:  Persistent atrial fibrillation, ekg shows irregular irregular rhythm. CHA2DS2-VASc of 5 (age, htn, vasc, gender).  Cont coreg 6.25 mg twice daily, Eliquis 5 mg twice daily.  Echo 09/2019 EF 60 to 65%, severely dilated left atrium.  Continue rate control strategy, unlikely for DC cardioversion to be successful with severely dilated LA. Grade 2 diastolic dysfunction, 1+ lower extremity edema.  Venous insufficiency could be contributing to lower extremity edema.  Leg raise, compression stocking advised continue torsemide.  Edema improved. Hypertension, BP controlled, slightly low.  Monitor BP frequently at home.  Consider reducing Hyzaar dose if blood pressure stays low.  Continue Coreg, Hyzaar.  Follow-up 12 months  This note was generated in part or whole with voice recognition software. Voice recognition is usually quite accurate but there are transcription errors that can and very often do occur. I apologize for any typographical errors that were not detected and corrected.  Medication Adjustments/Labs and Tests Ordered: Current medicines are reviewed at length with the patient today.  Concerns regarding medicines are outlined above.  Orders Placed This Encounter  Procedures   EKG 12-Lead    No orders of the defined types were placed in this encounter.   Patient Instructions  Medication Instructions:  Your physician recommends that you continue on your current medications as directed. Please refer to the Current Medication list given to you  today.  *If you need a refill on your cardiac medications before your next appointment, please call your pharmacy*   Lab Work: None ordered If you have labs (  blood work) drawn today and your tests are completely normal, you will receive your results only by: [place] (if you have MyChart) OR A paper copy in the mail If you have any lab test that is abnormal or we need to change your treatment, we will call you to review the results.   Testing/Procedures: None ordered   Follow-Up: At [facility], you and your health needs are our priority.  As part of our continuing mission to provide you with exceptional heart care, we have created designated Provider Care Teams.  These Care Teams include your primary Cardiologist (physician) and Advanced Practice Providers (APPs -  Physician Assistants and Nurse Practitioners) who all work together to provide you with the care you need, when you need it.  We recommend signing up for the patient portal called "MyChart".  Sign up information is provided on this After Visit Summary.  MyChart is used to connect with patients for Virtual Visits (Telemedicine).  Patients are able to view lab/test results, encounter notes, upcoming appointments, etc.  Non-urgent messages can be sent to your provider as well.   To learn more about what you can do with MyChart, go to [email or web address].    Your next appointment:   1 year(s)  The format for your next appointment:   In Person  Provider:   Kate Sable, MD   Other Instructions    Signed, Kate Sable, MD  02/24/2021 4:32 PM    [place]

## 2021-02-26 ENCOUNTER — Other Ambulatory Visit: Payer: Self-pay

## 2021-02-26 ENCOUNTER — Other Ambulatory Visit: Payer: Self-pay | Admitting: Nurse Practitioner

## 2021-02-26 DIAGNOSIS — R600 Localized edema: Secondary | ICD-10-CM

## 2021-03-03 ENCOUNTER — Other Ambulatory Visit: Payer: Self-pay | Admitting: Nurse Practitioner

## 2021-03-03 DIAGNOSIS — M064 Inflammatory polyarthropathy: Secondary | ICD-10-CM

## 2021-03-03 DIAGNOSIS — M1612 Unilateral primary osteoarthritis, left hip: Secondary | ICD-10-CM

## 2021-03-03 DIAGNOSIS — E876 Hypokalemia: Secondary | ICD-10-CM

## 2021-03-03 DIAGNOSIS — R600 Localized edema: Secondary | ICD-10-CM

## 2021-03-03 MED ORDER — POTASSIUM CHLORIDE ER 10 MEQ PO TBCR: 10.0000 meq | EXTENDED_RELEASE_TABLET | Freq: Every day | ORAL | 1 refills | Status: DC

## 2021-03-03 MED ORDER — ATORVASTATIN CALCIUM 10 MG PO TABS: 10.0000 mg | ORAL_TABLET | Freq: Every day | ORAL | 1 refills | Status: DC

## 2021-03-03 MED ORDER — IBUPROFEN 600 MG PO TABS: 600.0000 mg | ORAL_TABLET | Freq: Three times a day (TID) | ORAL | 1 refills | Status: DC | PRN

## 2021-03-03 MED ORDER — TORSEMIDE 20 MG PO TABS: ORAL_TABLET | ORAL | 1 refills | Status: DC

## 2021-03-11 DIAGNOSIS — Z1231 Encounter for screening mammogram for malignant neoplasm of breast: Secondary | ICD-10-CM | POA: Diagnosis not present

## 2021-03-31 ENCOUNTER — Encounter: Payer: Self-pay | Admitting: Nurse Practitioner

## 2021-03-31 ENCOUNTER — Other Ambulatory Visit: Payer: Self-pay

## 2021-03-31 ENCOUNTER — Ambulatory Visit (INDEPENDENT_AMBULATORY_CARE_PROVIDER_SITE_OTHER): Payer: Medicare HMO | Admitting: Nurse Practitioner

## 2021-03-31 VITALS — BP 113/72 | HR 93 | Temp 97.6°F | Ht 63.0 in | Wt 194.0 lb

## 2021-03-31 DIAGNOSIS — I482 Chronic atrial fibrillation, unspecified: Secondary | ICD-10-CM | POA: Diagnosis not present

## 2021-03-31 DIAGNOSIS — E559 Vitamin D deficiency, unspecified: Secondary | ICD-10-CM | POA: Insufficient documentation

## 2021-03-31 DIAGNOSIS — E039 Hypothyroidism, unspecified: Secondary | ICD-10-CM

## 2021-03-31 DIAGNOSIS — M158 Other polyosteoarthritis: Secondary | ICD-10-CM

## 2021-03-31 DIAGNOSIS — Z23 Encounter for immunization: Secondary | ICD-10-CM | POA: Diagnosis not present

## 2021-03-31 DIAGNOSIS — M064 Inflammatory polyarthropathy: Secondary | ICD-10-CM | POA: Diagnosis not present

## 2021-03-31 DIAGNOSIS — M1612 Unilateral primary osteoarthritis, left hip: Secondary | ICD-10-CM

## 2021-03-31 DIAGNOSIS — E782 Mixed hyperlipidemia: Secondary | ICD-10-CM | POA: Diagnosis not present

## 2021-03-31 DIAGNOSIS — I6523 Occlusion and stenosis of bilateral carotid arteries: Secondary | ICD-10-CM

## 2021-03-31 DIAGNOSIS — E569 Vitamin deficiency, unspecified: Secondary | ICD-10-CM | POA: Diagnosis not present

## 2021-03-31 DIAGNOSIS — Z79899 Other long term (current) drug therapy: Secondary | ICD-10-CM

## 2021-03-31 DIAGNOSIS — I890 Lymphedema, not elsewhere classified: Secondary | ICD-10-CM

## 2021-03-31 DIAGNOSIS — R7309 Other abnormal glucose: Secondary | ICD-10-CM | POA: Diagnosis not present

## 2021-03-31 DIAGNOSIS — Z0000 Encounter for general adult medical examination without abnormal findings: Secondary | ICD-10-CM | POA: Diagnosis not present

## 2021-03-31 MED ORDER — OXYCODONE ER 18 MG PO C12A: 1.0000 | EXTENDED_RELEASE_CAPSULE | Freq: Two times a day (BID) | ORAL | 0 refills | Status: DC

## 2021-03-31 MED ORDER — OXYCODONE HCL 10 MG PO TABS: 10.0000 mg | ORAL_TABLET | Freq: Every day | ORAL | 0 refills | Status: DC | PRN

## 2021-03-31 NOTE — Progress Notes (Addendum)
Subjective:   Martha Peterson is a 80 y.o. female who presents for Medicare Annual (Subsequent) preventive examination. She states that she is doing pretty well. Does overdo it sometimes. Did have appointment with her cardiolgist in September. Things went well. She is to follow up in one year. States that she had mammogram a few weeks ago. Results were negative and yearly screening is recommended. She denies new problems or concerns. States that there are somedays when breakthrough pain medication is not holding her from one dose of longer acting pain medication to another. She is currently breaking the immediate release oxycodone in half and taking 1/2 tablet twice daily. Breakthrough pain has become worse over past month or so.   Indication for chronic opioid: chronic pain and inflammatory polyarthritis. Patient unable to take NSAIDs due to treatment with blood thinners. 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  Medication and dose: Oxycodone XR 13.69m twice daily and oxycodone 173mQD prn for breakthrough pain  # pills per month: 60 oxycodone XR and 30 oxycodone 1020mast UDS date: 03/31/2021 Opioid Treatment Agreement signed (Y/N): yes Opioid Treatment Agreement last reviewed with patient:  03/31/2021 [facility] reviewed this encounter (include red flags): no   She has no other concerns or complaints today. She denies chest pain, chest pressure, or shortness of breath. She denies headaches or visual disturbances. She denies abdominal pain, nausea, vomiting, or changes in bowel or bladder habits.    She is due ot have routine, fasting labs during today's visit.  She would like to get her flu shot today. She is due for Tdap, but will wait until her next visit for this.     Review of Systems    Review of Systems  Constitutional:  Positive for malaise/fatigue. Negative for fever and weight loss.  HENT:  Negative for congestion, ear discharge, ear pain,  hearing loss and sore throat.   Eyes: Negative.   Respiratory:  Negative for cough, shortness of breath and wheezing.   Cardiovascular:  Positive for leg swelling. Negative for chest pain and orthopnea.       Patient sees cardiology on routine basis due to chronic a-fib  Gastrointestinal:  Negative for abdominal pain, blood in stool, constipation, diarrhea, nausea and vomiting.  Genitourinary:  Negative for dysuria, flank pain, frequency and urgency.  Musculoskeletal:  Positive for back pain, joint pain, myalgias and neck pain.  Skin:  Negative for itching and rash.  Neurological:  Negative for dizziness, weakness and headaches.  Psychiatric/Behavioral:  Negative for depression. The patient is nervous/anxious.        Anxiety/depression currently well managed with current medication.          Objective:   Physical Exam Vitals and nursing note reviewed.  Constitutional:      Appearance: Normal appearance. She is well-developed. She is obese.  HENT:     Head: Normocephalic and atraumatic.     Right Ear: Tympanic membrane, ear canal and external ear normal.     Left Ear: Tympanic membrane, ear canal and external ear normal.     Nose: Nose normal.     Mouth/Throat:     Mouth: Mucous membranes are moist.     Pharynx: Oropharynx is clear.  Eyes:     Extraocular Movements: Extraocular movements intact.     Conjunctiva/sclera: Conjunctivae normal.     Pupils: Pupils are equal, round, and reactive to light.  Neck:     Vascular: No carotid bruit.  Cardiovascular:     Rate and Rhythm: Normal rate. Rhythm irregular.     Pulses: Normal pulses.     Heart sounds: Normal heart sounds.     Comments: Patient has chronic pedal edema, worse on left side than right. Stable from baseline.  Pulmonary:     Effort: Pulmonary effort is normal.     Breath sounds: Normal breath sounds.  Abdominal:     General: Bowel sounds are normal. There is no distension.     Palpations: Abdomen is soft. There is  no mass.     Tenderness: There is no guarding or rebound.     Hernia: No hernia is present.  Musculoskeletal:        General: Normal range of motion.     Cervical back: Normal range of motion and neck supple.     Comments: Moderate lower back pain, worse with bending and twisting at the waist. Exertion increases pain. Left foot deformity with swelling present and unchanged. ROM and strength of the left foot is diminished but at baseline.  The patient is using a walking cane to help her with mobility and balance.    Lymphadenopathy:     Cervical: No cervical adenopathy.  Skin:    General: Skin is warm and dry.     Capillary Refill: Capillary refill takes less than 2 seconds.  Neurological:     General: No focal deficit present.     Mental Status: She is alert and oriented to person, place, and time. Mental status is at baseline.     Cranial Nerves: No cranial nerve deficit.     Sensory: No sensory deficit.     Motor: No weakness.     Coordination: Coordination normal.     Gait: Gait normal.     Deep Tendon Reflexes: Reflexes normal.  Psychiatric:        Mood and Affect: Mood normal.        Behavior: Behavior normal.        Thought Content: Thought content normal.        Judgment: Judgment normal.    Today's Vitals   03/31/21 1001  BP: 113/72  Pulse: 93  Temp: 97.6 F (36.4 C)  SpO2: 99%  Weight: 194 lb 0.6 oz (88 kg)  Height: _0  (1.6 m)   Body mass index is 34.37 kg/m.  Advanced Directives 10/20/2019 07/26/2019 07/20/2019 07/17/2019 10/28/2018 10/28/2018 09/23/2017  Does Patient Have a Medical Advance Directive? _1  No No  Would patient like information on creating a medical advance directive? No - Patient declined No - Patient declined No - Patient declined Yes (MAU/Ambulatory/Procedural Areas - Information given) No - Patient declined No - Patient declined No - Patient declined    Current Medications (verified) Outpatient Encounter Medications as of 03/31/2021   Medication Sig   allopurinol (ZYLOPRIM) 100 MG tablet TAKE 2 TABLETS EVERY DAY   apixaban (ELIQUIS) 5 MG TABS tablet Take 1 tablet (5 mg total) by mouth 2 (two) times daily.   ascorbic acid (VITAMIN C) 500 MG tablet Take 500 mg by mouth daily.   aspirin EC 81 MG tablet Take 81 mg by mouth daily.   atorvastatin (LIPITOR) 10 MG tablet Take 1 tablet (10 mg total) by mouth daily at 6 PM.   carvedilol (COREG) 6.25 MG tablet Take 1 tablet (6.25 mg total) by mouth 2 (two) times daily with a meal.   cholecalciferol (VITAMIN D) 400 UNITS TABS tablet Take 1,000 Units  by mouth.   diclofenac sodium (VOLTAREN) 1 % GEL Apply 3 grams to 3 large joints, up to 3 times daily as needed.   fluticasone (FLONASE) 50 MCG/ACT nasal spray Place 2 sprays into both nostrils daily. (Patient taking differently: Place 2 sprays into both nostrils as needed.)   Ginger, Zingiber officinalis, (GINGER PO) Take by mouth.   ibuprofen (ADVIL) 600 MG tablet Take 1 tablet (600 mg total) by mouth every 8 (eight) hours as needed.   levocetirizine (XYZAL) 5 MG tablet Take 1 tablet (5 mg total) by mouth every evening.   levothyroxine (SYNTHROID) 137 MCG tablet Take 1 tablet (137 mcg total) by mouth daily before breakfast.   losartan-hydrochlorothiazide (HYZAAR) 100-25 MG tablet Take 1 tablet by mouth daily.   magnesium oxide (MAG-OX) 400 MG tablet Take 400 mg by mouth daily.    mirtazapine (REMERON) 7.5 MG tablet Take 1 to 2 tablets po QPM   Misc Natural Products (TART CHERRY ADVANCED PO) Take by mouth daily.   Multiple Vitamins-Minerals (PRESERVISION AREDS 2 PO) Take by mouth 2 (two) times daily.   omega-3 acid ethyl esters (LOVAZA) 1 G capsule Take 1 g by mouth 2 (two) times daily.   oxyCODONE ER 13.5 MG C12A Take 13.5 mg by mouth 2 (two) times daily as needed.   oxyCODONE ER 18 MG C12A Take 1 tablet by mouth 2 (two) times daily.   potassium chloride (KLOR-CON) 10 MEQ tablet Take 1 tablet (10 mEq total) by mouth daily.   predniSONE  (DELTASONE) 5 MG tablet Take 2 tablets (10 mg total) by mouth daily with breakfast.   tiZANidine (ZANAFLEX) 4 MG tablet Take 1 tablet (4 mg total) by mouth 2 (two) times daily.   torsemide (DEMADEX) 20 MG tablet TAKE 1 TABLET EVERY DAY (DISCONTINUE FUROSEMIDE)   traZODone (DESYREL) 50 MG tablet Take 0.5-1 tablets (25-50 mg total) by mouth at bedtime as needed for sleep.   TURMERIC PO Take by mouth daily.   vitamin E 400 UNIT capsule Take 800 Units by mouth daily.    [DISCONTINUED] Oxycodone HCl 10 MG TABS Take 1 tablet (10 mg total) by mouth daily as needed.   Oxycodone HCl 10 MG TABS Take 1 tablet (10 mg total) by mouth daily as needed.   No facility-administered encounter medications on file as of 03/31/2021.    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   Microhematuria    Mild mitral regurgitation    Mild tricuspid regurgitation    Osteopenia    Pulmonary fibrosis ([facility])    Urinary incontinence    Past Surgical History:  Procedure Laterality Date   ABDOMINAL HYSTERECTOMY     ANKLE SURGERY Left    APPENDECTOMY     CAROTID PTA/STENT INTERVENTION N/A 07/17/2019   Procedure: CAROTID PTA/STENT INTERVENTION;  Surgeon: Algernon Huxley, MD;  Location: [facility];  Service: Cardiovascular;  Laterality: N/A;   CAROTID-SUBCLAVIAN BYPASS GRAFT N/A 07/26/2019   Procedure: BYPASS GRAFT CAROTID-SUBCLAVIAN ( CAROTID TO CAROTID BYPASS);  Surgeon: Algernon Huxley, MD;  Location: [facility];  Service: Vascular;  Laterality: N/A;  CHOLECYSTECTOMY     COLONOSCOPY WITH PROPOFOL N/A 12/04/2014   Procedure: COLONOSCOPY WITH PROPOFOL;  Surgeon:  Lollie Sails, MD;  Location: [facility];  Service: Endoscopy;  Laterality: N/A;   FRACTURE SURGERY     Femur Fx; LT Ankle Pinning   JOINT REPLACEMENT     RT TKR   Patial Thyroid Resection     Family History  Problem Relation Age of Onset   Heart Problems Mother    Heart Problems Father    Diabetes Father    Lupus Sister    Thyroid disease Sister    Heart Problems Sister    Hypertension Son    Diabetes Son        borderline    Arthritis Daughter        in the left knee    Diabetes Daughter    Neuropathy Daughter    Depression Daughter    Anxiety disorder Daughter    Social History   Socioeconomic History   Marital status: Widowed    Spouse name: Jeneen Rinks   Number of children: Not on file   Years of education: Not on file   Highest education level: Not on file  Occupational History   Not on file  Tobacco Use   Smoking status: Former    Years: 1.00    Types: Cigarettes   Smokeless tobacco: Never  Vaping Use   Vaping Use: Never used  Substance and Sexual Activity   Alcohol use: No   Drug use: No   Sexual activity: Not Currently  Other Topics Concern   Not on file  Social History Narrative   Lives at home with husband in private residence   Social Determinants of Health   Financial Resource Strain: Not on file  Food Insecurity: Not on file  Transportation Needs: Not on file  Physical Activity: Not on file  Stress: Not on file  Social Connections: Not on file    Tobacco Counseling Patient is non smoker    Diabetic?no     Activities of Daily Living In your present state of health, do you have any difficulty performing the following activities: 03/31/2021 01/27/2021  Hearing? N N  Vision? N N  Difficulty concentrating or making decisions? N N  Walking or climbing stairs? Y Y  Dressing or bathing? N N  Doing errands, shopping? N N  Some recent data might be hidden    Patient Care Team: Ronnell Freshwater, NP as PCP - General (Family  Medicine) Lloyd Huger, MD as Consulting Physician (Oncology)  Indicate any recent Medical Services you may have received from other than [facility] providers in the past year (date may be approximate).     Assessment:  1. Encounter for Medicare annual wellness exam Appointment for annual Medicare wellness visit today.  2. Hypothyroidism (acquired) Check thyroid panel today.  Adjust levothyroxine dosing as indicated. - T4, free - TSH  3. Hyperlipidemia, mixed Check fasting lipid panel.  Adjust dosing of atorvastatin as indicated. - Lipid panel  4. Carotid artery 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, bilateral Patient with history of carotid endarterectomy.  Currently on blood thinner, statin therapy.  Sees been a vascular provider routinely.  5. Chronic atrial fibrillation ([place]) Most recent visit to cardiology September, 2022.  Condition stable.  Continue yearly visits with cardiology as per recommendation.  6. Lymphedema Chronic edema and lymphedema of bilateral lower extremities.  This is more severe on left side than right.  She does take 1 pill as needed  at home.  Today, edema at baseline.  Will monitor.  7. Vitamin deficiency Check vitamin D level.  Treat deficiency as indicated. - Vitamin D (25 hydroxy)  8. Elevated glucose Check hemoglobin A1c and treat elevation of the glucose as indicated. - Hemoglobin A1c  9. Inflammatory polyarthritis ([place]) Patient reporting increased pain levels.  Having to use breakthrough pain medication more frequently.  Will increase oxycodone ER to 18 mg twice daily.  Reviewed risk factors and side effects associated with taking narcotics for long-term periods.  Her PDMP profile was reviewed today.  Her overdose risk is 130.  Her fill history is appropriate.  She does have a controlled substances agreement policy signed for [place] [facility] care at [facility]. - oxyCODONE ER 18 MG C12A; Take 1 tablet by mouth 2 (two) times daily.  Dispense:  60 capsule; Refill: 0  10. Other osteoarthritis involving multiple joints Will continue with oxycodone 10 mg tablets.  She may take 1/2 tablet twice daily or 1 tablet daily as needed.  She voices understanding and agreement with that potential side effect risk factors associated with taking narcotic pain medications for long periods of time.  New prescription sent to her pharmacy. - Oxycodone HCl 10 MG TABS; Take 1 tablet (10 mg total) by mouth daily as needed.  Dispense: 30 tablet; Refill: 0  11. Primary osteoarthritis of left hip Treatment with oxycodone ER 18 mg twice daily and oxycodone 10 mg, taking either 1/2 tablet twice daily or 1 tablet daily as needed.  Reassess pain at next visit in 2 months. - oxyCODONE ER 18 MG C12A; Take 1 tablet by mouth 2 (two) times daily.  Dispense: 60 capsule; Refill: 0  12. Encounter for long-term (current) use of medications Urine drug screen was obtained during today's visit. - Drug Screen, Urine  13. Need for influenza vaccination Flu shot was administered during today's visit. - Flu Vaccine QUAD High Dose(Fluad)  14. Health care maintenance Routine, fasting labs were drawn during today's visit. - CBC - Comp Met (CMET)   Hearing/Vision screen No results found.   Depression Screen PHQ 2/9 Scores 03/31/2021 01/27/2021 11/26/2020 09/25/2020 08/13/2020 07/11/2020 05/27/2020  PHQ - 2 Score 0 0 0 0 0 0 0  PHQ- 9 Score 1 0 0 0 - - -    Fall Risk Fall Risk  03/31/2021 01/27/2021 11/26/2020 09/25/2020 08/13/2020  Falls in the past year? 0 0 0 0 0  Number falls in past yr: 0 0 0 - -  Injury with Fall? 0 0 0 - -  Risk for fall due to : - - - - -  Follow up Falls evaluation completed Falls evaluation completed Falls evaluation completed Falls evaluation completed -    FALL RISK PREVENTION PERTAINING TO THE HOME:  Any stairs in or around the home? No  If so, are there any without handrails? No  Home free of loose throw rugs in walkways, pet beds,  electrical cords, etc? Yes  Adequate lighting in your home to reduce risk of falls? Yes   ASSISTIVE DEVICES UTILIZED TO PREVENT FALLS:  Life alert? No  Use of a cane, walker or w/c? Yes  Grab bars in the bathroom? Yes  Shower chair or bench in shower? Yes  Elevated toilet seat or a handicapped toilet? Yes   TIMED UP AND GO:  Was the test performed? Yes .  Length of time to ambulate 10 feet: 30 sec.   Gait slow and steady with assistive device  Cognitive Function: MMSE - Mini Mental State Exam 02/13/2020 02/09/2019 01/24/2018  Orientation to time _0 Orientation to Place _1 Registration _2 Attention/ Calculation _3 Recall _4 Language- name 2 objects _5 Language- repeat _6 Language- follow 3 step command _7 Language- read & follow direction _8 Write a sentence _9 Copy design _10 Total score _11 6CIT Screen 03/31/2021  What Year? 0 points  What month? 0 points  What time? 0 points  Count back from 20 0 points  Months in reverse 0 points  Repeat phrase 0 points  Total Score 0    Immunizations Immunization History  Administered Date(s) Administered   Fluad Quad(high Dose 65+) 03/31/2021   Influenza Inj Mdck Quad Pf 03/18/2018, 04/14/2019, 04/16/2020   Influenza-Unspecified 05/04/2017   Moderna Sars-Covid-2 Vaccination 08/09/2019, 09/06/2019, 07/25/2020   Pneumococcal Conjugate-13 03/01/2018   Pneumococcal Polysaccharide-23 05/31/2015   Zoster Recombinat (Shingrix) 01/25/2018, 04/18/2018, 03/31/2019    TDAP status: Due, Education has been provided regarding the importance of this vaccine. Advised may receive this vaccine at local pharmacy or Health Dept. Aware to provide a copy of the vaccination record if obtained from local pharmacy or Health Dept. Verbalized acceptance and understanding.  Flu Vaccine status: Up to date  Pneumococcal vaccine status: Up to date  Covid-19 vaccine status: Completed  vaccines  Qualifies for Shingles Vaccine? Yes   Zostavax completed Yes   Shingrix Completed?: Yes  Screening Tests Health Maintenance  Topic Date Due   TETANUS/TDAP  Never done   COVID-19 Vaccine (4 - Booster for Moderna series) 10/17/2020   INFLUENZA VACCINE  Completed   DEXA SCAN  Completed   Zoster Vaccines- Shingrix  Completed   HPV VACCINES  Aged Out    Health Maintenance  Health Maintenance Due  Topic Date Due   TETANUS/TDAP  Never done   COVID-19 Vaccine (4 - Booster for Moderna series) 10/17/2020    Colorectal cancer screening: No longer required.   Mammogram status: No longer required due to age out.  Bone Density status: Completed 2018. Results reflect: Bone density results: OSTEOPENIA. Repeat every 5 years.  Lung Cancer Screening: (Low Dose CT Chest recommended if Age 67-80 years, 30 pack-year currently smoking OR have quit w/in 15years.) does not qualify.   Lung Cancer Screening Referral: no  Additional Screening:  Hepatitis C Screening: does not qualify; Completed 2015  Vision Screening: Recommended annual ophthalmology exams for early detection of glaucoma and other disorders of the eye. Is the patient up to date with their annual eye exam?  Yes  Who is the provider or what is the name of the office in which the patient attends annual eye exams? [facility] If pt is not established with a provider, would they like to be referred to a provider to establish care? No .   Dental Screening: Recommended annual dental exams for proper oral hygiene  Community Resource Referral / Chronic Care Management: CRR required this visit?  No   CCM required this visit?  No      Plan:     I have personally reviewed and noted the following in the patient's chart:   Medical and social history Use of alcohol, tobacco or illicit drugs  Current medications and supplements including opioid prescriptions.  Functional ability and status Nutritional  status Physical activity Advanced directives List of other physicians Hospitalizations, surgeries, and ER visits in previous 12 months Vitals Screenings to include cognitive, depression, and falls Referrals and appointments  In addition, I have reviewed and discussed with patient certain preventive protocols, quality metrics, and best practice recommendations. A written personalized care plan for preventive services as well as general preventive health recommendations were provided to patient.     [name], FNP-c 03/31/2021   Nurse Notes:   This note was dictated using Systems analyst. Rapid proofreading was performed to expedite the delivery of the information. Despite proofreading, phonetic errors will occur which are common with this voice recognition software. Please take this into consideration. If there are any concerns, please contact our office.

## 2021-04-01 ENCOUNTER — Encounter: Payer: Self-pay | Admitting: Nurse Practitioner

## 2021-04-01 LAB — CBC
Hematocrit: 35.4 % (ref 34.0–46.6)
Hemoglobin: 11.9 g/dL (ref 11.1–15.9)
MCH: 32.4 pg (ref 26.6–33.0)
MCHC: 33.6 g/dL (ref 31.5–35.7)
MCV: 97 fL (ref 79–97)
Platelets: 126 10*3/uL — ABNORMAL LOW (ref 150–450)
RBC: 3.67 x10E6/uL — ABNORMAL LOW (ref 3.77–5.28)
RDW: 11.8 % (ref 11.7–15.4)
WBC: 3.8 10*3/uL (ref 3.4–10.8)

## 2021-04-01 LAB — HEMOGLOBIN A1C
Est. average glucose Bld gHb Est-mCnc: 123 mg/dL
Hgb A1c MFr Bld: 5.9 % — ABNORMAL HIGH (ref 4.8–5.6)

## 2021-04-01 LAB — COMPREHENSIVE METABOLIC PANEL
ALT: 17 IU/L (ref 0–32)
AST: 36 IU/L (ref 0–40)
Albumin/Globulin Ratio: 1.6 (ref 1.2–2.2)
Albumin: 4.4 g/dL (ref 3.6–4.6)
Alkaline Phosphatase: 119 IU/L (ref 44–121)
BUN/Creatinine Ratio: 34 — ABNORMAL HIGH (ref 12–28)
BUN: 49 mg/dL — ABNORMAL HIGH (ref 8–27)
Bilirubin Total: 0.6 mg/dL (ref 0.0–1.2)
CO2: 29 mmol/L (ref 20–29)
Calcium: 9.6 mg/dL (ref 8.7–10.3)
Chloride: 95 mmol/L — ABNORMAL LOW (ref 96–106)
Creatinine, Ser: 1.43 mg/dL — ABNORMAL HIGH (ref 0.57–1.00)
Globulin, Total: 2.8 g/dL (ref 1.5–4.5)
Glucose: 112 mg/dL — ABNORMAL HIGH (ref 70–99)
Potassium: 4.1 mmol/L (ref 3.5–5.2)
Sodium: 139 mmol/L (ref 134–144)
Total Protein: 7.2 g/dL (ref 6.0–8.5)
eGFR: 37 mL/min/{1.73_m2} — ABNORMAL LOW (ref >59)

## 2021-04-01 LAB — DRUG SCREEN, URINE
Amphetamines, Urine: NEGATIVE ng/mL
Barbiturate screen, urine: NEGATIVE ng/mL
Benzodiazepine Quant, Ur: NEGATIVE ng/mL
Cannabinoid Quant, Ur: NEGATIVE ng/mL
Cocaine (Metab.): NEGATIVE ng/mL
Opiate Quant, Ur: POSITIVE ng/mL — AB
PCP Quant, Ur: NEGATIVE ng/mL

## 2021-04-01 LAB — TSH: TSH: 1.6 u[IU]/mL (ref 0.450–4.500)

## 2021-04-01 LAB — LIPID PANEL
Chol/HDL Ratio: 2.5 ratio (ref 0.0–4.4)
Cholesterol, Total: 101 mg/dL (ref 100–199)
HDL: 41 mg/dL (ref >39)
LDL Chol Calc (NIH): 40 mg/dL (ref 0–99)
Triglycerides: 104 mg/dL (ref 0–149)
VLDL Cholesterol Cal: 20 mg/dL (ref 5–40)

## 2021-04-01 LAB — T4, FREE: Free T4: 1.77 ng/dL (ref 0.82–1.77)

## 2021-04-01 LAB — VITAMIN D 25 HYDROXY (VIT D DEFICIENCY, FRACTURES): Vit D, 25-Hydroxy: 37.1 ng/mL (ref 30.0–100.0)

## 2021-04-01 NOTE — Progress Notes (Addendum)
Appropriately positive for opiates and negative for all other controlled medications.

## 2021-04-01 NOTE — Progress Notes (Signed)
Elevated renal functions. Will cut allopurinol to 1 tablet daily and decrease losartan/hctz to 1/2 tablet daily. Recheck Bmp at next visit. Consider u/s abdomen as it is time for yearly surveillance for lever. MyChart message sent to patient.

## 2021-04-07 ENCOUNTER — Other Ambulatory Visit: Payer: Self-pay | Admitting: Nurse Practitioner

## 2021-04-07 DIAGNOSIS — E039 Hypothyroidism, unspecified: Secondary | ICD-10-CM

## 2021-04-16 ENCOUNTER — Other Ambulatory Visit: Payer: Self-pay

## 2021-04-16 DIAGNOSIS — E039 Hypothyroidism, unspecified: Secondary | ICD-10-CM

## 2021-04-16 MED ORDER — LEVOTHYROXINE SODIUM 137 MCG PO TABS: 137.0000 ug | ORAL_TABLET | Freq: Every day | ORAL | 1 refills | Status: DC

## 2021-04-17 NOTE — Progress Notes (Deleted)
Office Visit Note  Patient: Martha Peterson             Date of Birth: 02/21/1941           MRN: [id number]             PCP: Ronnell Freshwater, NP Referring: Ronnell Freshwater, NP Visit Date: 05/01/2021 Occupation: @GUAROCC @  Subjective:  No chief complaint on file.   History of Present Illness: Martha Peterson is a 80 y.o. female ***   Activities of Daily Living:  Patient reports morning stiffness for *** {minute/hour:19697}.   Patient {ACTIONS;DENIES/REPORTS:21021675::"Denies"} nocturnal pain.  Difficulty dressing/grooming: {ACTIONS;DENIES/REPORTS:21021675::"Denies"} Difficulty climbing stairs: {ACTIONS;DENIES/REPORTS:21021675::"Denies"} Difficulty getting out of chair: {ACTIONS;DENIES/REPORTS:21021675::"Denies"} Difficulty using hands for taps, buttons, cutlery, and/or writing: {ACTIONS;DENIES/REPORTS:21021675::"Denies"}  No Rheumatology ROS completed.   PMFS History:  Patient Active Problem List   Diagnosis Date Noted   Hyperlipidemia, mixed 03/31/2021   Vitamin deficiency 03/31/2021   Chronic allergic rhinitis 02/01/2021   Adjustment insomnia 12/07/2020   Encounter to establish care 09/26/2020   Chronic atrial fibrillation ([place]) 09/26/2020   Heart failure with reduced ejection fraction ([place]) 09/26/2020   Thrombocytopenia, unspecified ([place]) 09/26/2020   Lymphedema 07/11/2020   Elevated glucose 06/27/2020   Chronic pain syndrome 06/27/2020   Stage 3 chronic kidney disease ([place]) 06/27/2020   Cellulitis of left lower extremity 06/27/2020   Needs flu shot 05/08/2020   Lower extremity edema 04/16/2020   [name] (nonalcoholic steatohepatitis) 04/16/2020   Encounter for general adult medical examination with abnormal findings 02/25/2020   Chronic gout without tophus 02/25/2020   Current moderate episode of major depressive disorder ([place]) 02/25/2020   Tick bite of left hip 12/06/2019   Encounter for screening mammogram for malignant neoplasm of breast 12/06/2019    Screening for osteoporosis 12/06/2019   S/P carotid endarterectomy 09/09/2019   Iron deficiency anemia 09/09/2019   Abnormal renal function 09/09/2019   Hypomagnesemia 09/09/2019   Hospital discharge follow-up 08/02/2019   Hollenhorst plaque, right eye 07/04/2019   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[place]) 07/04/2019   Left ventricular systolic dysfunction 09/73/5329   Diastolic dysfunction 92/42/6834   Degenerative joint disease involving multiple joints 19/62/2297   Nonalcoholic fatty liver disease 02/12/2019   Dysuria 02/12/2019   Encounter for long-term (current) use of medications 01/22/2019   Hypokalemia 04/27/2018   Osteopenia 04/04/2018   Hypothyroidism (acquired) 02/14/2018   Neutropenia ([place]) 02/14/2018   Primary osteoarthritis of left hip 12/06/2017   Essential hypertension 09/29/2017   Postural dizziness 09/29/2017   Carotid artery 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, bilateral 09/29/2017   Acquired left ventricular hypertrophy 09/29/2017   Inflammatory polyarthritis ([place]) 09/29/2017   Need for vaccination for Strep pneumoniae 09/29/2017   H/O ankle fusion 05/02/2016   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   Abnormal LFTs 02/13/2014   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   Primary osteoarthritis of both hands 02/13/2014   H/O: gout 02/13/2014   Rheumatoid factor positive 02/13/2014    Past Medical History:  Diagnosis Date   Arthritis    Asthma    Atrial fibrillation ([place])    per patient's daughter    COPD (chronic obstructive pulmonary disease) ([place])    Diverticulitis    Gout    Hyperlipidemia    Hypertension    Hypothyroidism    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   Microhematuria    Mild mitral regurgitation    Mild tricuspid regurgitation    Osteopenia    Pulmonary fibrosis ([place])  Urinary incontinence     Family History  Problem Relation Age of Onset   Heart Problems Mother    Heart Problems Father     Diabetes Father    Lupus Sister    Thyroid disease Sister    Heart Problems Sister    Hypertension Son    Diabetes Son        borderline    Arthritis Daughter        in the left knee    Diabetes Daughter    Neuropathy Daughter    Depression Daughter    Anxiety disorder Daughter    Past Surgical History:  Procedure Laterality Date   ABDOMINAL HYSTERECTOMY     ANKLE SURGERY Left    APPENDECTOMY     CAROTID PTA/STENT INTERVENTION N/A 07/17/2019   Procedure: CAROTID PTA/STENT INTERVENTION;  Surgeon: Algernon Huxley, MD;  Location: [facility];  Service: Cardiovascular;  Laterality: N/A;   CAROTID-SUBCLAVIAN BYPASS GRAFT N/A 07/26/2019   Procedure: BYPASS GRAFT CAROTID-SUBCLAVIAN ( CAROTID TO CAROTID BYPASS);  Surgeon: Algernon Huxley, MD;  Location: [facility];  Service: Vascular;  Laterality: N/A;   CHOLECYSTECTOMY     COLONOSCOPY WITH PROPOFOL N/A 12/04/2014   Procedure: COLONOSCOPY WITH PROPOFOL;  Surgeon: Lollie Sails, MD;  Location: [facility] ENDOSCOPY;  Service: Endoscopy;  Laterality: N/A;   FRACTURE SURGERY     Femur Fx; LT Ankle Pinning   JOINT REPLACEMENT     RT TKR   Patial Thyroid Resection     Social History   Social History Narrative   Lives at home with husband in private residence   Immunization History  Administered Date(s) Administered   Fluad Quad(high Dose 65+) 03/31/2021   Influenza Inj Mdck Quad Pf 03/18/2018, 04/14/2019, 04/16/2020   Influenza-Unspecified 05/04/2017   Moderna Sars-Covid-2 Vaccination 08/09/2019, 09/06/2019, 07/25/2020   Pneumococcal Conjugate-13 03/01/2018   Pneumococcal Polysaccharide-23 05/31/2015   Zoster Recombinat (Shingrix) 01/25/2018, 04/18/2018, 03/31/2019     Objective: Vital Signs: There were no vitals taken for this visit.   Physical Exam   Musculoskeletal Exam: ***  CDAI Exam: CDAI Score: -- Patient Global: --; Provider Global: -- Swollen: --; Tender: -- Joint Exam 05/01/2021   No joint exam has been documented  for this visit   There is currently no information documented on the homunculus. Go to the Rheumatology activity and complete the homunculus joint exam.  Investigation: No additional findings.  Imaging: No results found.  Recent Labs: Lab Results  Component Value Date   WBC 3.8 03/31/2021   HGB 11.9 03/31/2021   PLT 126 (L) 03/31/2021   NA 139 03/31/2021   K 4.1 03/31/2021   CL 95 (L) 03/31/2021   CO2 29 03/31/2021   GLUCOSE 112 (H) 03/31/2021   BUN 49 (H) 03/31/2021   CREATININE 1.43 (H) 03/31/2021   BILITOT 0.6 03/31/2021   ALKPHOS 119 03/31/2021   AST 36 03/31/2021   ALT 17 03/31/2021   PROT 7.2 03/31/2021   ALBUMIN 4.4 03/31/2021   CALCIUM 9.6 03/31/2021   GFRAA 50 (L) 07/10/2020    Speciality Comments: No specialty comments available.  Procedures:  No procedures performed Allergies: Latex, Codeine, Levaquin [levofloxacin], and Tape   Assessment / Plan:     Visit Diagnoses: No diagnosis found.  Orders: No orders of the defined types were placed in this encounter.  No orders of the defined types were placed in this encounter.   Face-to-face time spent with patient was *** minutes. Greater than 50% of  time was spent in counseling and coordination of care.  Follow-Up Instructions: No follow-ups on file.   Earnestine Mealing, CMA  Note - This record has been created using Editor, commissioning.  Chart creation errors have been sought, but may not always  have been located. Such creation errors do not reflect on  the standard of medical care.

## 2021-04-29 ENCOUNTER — Ambulatory Visit (INDEPENDENT_AMBULATORY_CARE_PROVIDER_SITE_OTHER): Payer: Medicare HMO | Admitting: Vascular Surgery

## 2021-04-29 ENCOUNTER — Encounter (INDEPENDENT_AMBULATORY_CARE_PROVIDER_SITE_OTHER): Payer: Medicare HMO

## 2021-04-30 ENCOUNTER — Other Ambulatory Visit: Payer: Self-pay | Admitting: Nurse Practitioner

## 2021-04-30 ENCOUNTER — Telehealth: Payer: Self-pay | Admitting: Nurse Practitioner

## 2021-04-30 DIAGNOSIS — M064 Inflammatory polyarthropathy: Secondary | ICD-10-CM

## 2021-04-30 DIAGNOSIS — M158 Other polyosteoarthritis: Secondary | ICD-10-CM

## 2021-04-30 DIAGNOSIS — M1612 Unilateral primary osteoarthritis, left hip: Secondary | ICD-10-CM

## 2021-04-30 DIAGNOSIS — M791 Myalgia, unspecified site: Secondary | ICD-10-CM

## 2021-04-30 MED ORDER — OXYCODONE HCL 10 MG PO TABS: 10.0000 mg | ORAL_TABLET | Freq: Every day | ORAL | 0 refills | Status: DC | PRN

## 2021-04-30 MED ORDER — OXYCODONE ER 18 MG PO C12A: 1.0000 | EXTENDED_RELEASE_CAPSULE | Freq: Two times a day (BID) | ORAL | 0 refills | Status: DC

## 2021-04-30 NOTE — Telephone Encounter (Signed)
Hey. I should have sent these prescriptions for her during her visit.  I have done that now. She does have a controlled substances policy on file here. Her PDMP was reviewed at visit on 10/17 and urine drug screen was done 10/17 and approprately positive for opiates only. She has follow up 12/19.

## 2021-04-30 NOTE — Telephone Encounter (Signed)
Patient requesting refills on the below medications be sent to [facility] on [address] in [place].    xyCODONE ER 18 MG C12A [[id number]]    Order Details Dose: 1 tablet Route: Oral Frequency: 2 times daily  Dispense Quantity: 60 capsule Refills: 0   Note to Pharmacy: Please note increased dose to 18mg  bid  Indications of Use: Chronic Pain       Sig: Take 1 tablet by mouth 2 (two) times daily.       Start Date: 03/31/21 End Date: --  Written Date: 03/31/21 Expiration Date: 09/27/21  Earliest Fill Date: 03/31/21       Diagnosis Association: Inflammatory polyarthritis ([place]) (M06.4); Primary osteoarthritis of left hip (M16.12)      Oxycodone HCl 10 MG TABS [[id number]]    Order Details Dose: 10 mg Route: Oral Frequency: Daily PRN  Dispense Quantity: 30 tablet Refills: 0   Indications of Use: Chronic Pain       Sig: Take 1 tablet (10 mg total) by mouth daily as needed.       Start Date: 03/31/21 End Date: --  Written Date: 03/31/21 Expiration Date: 09/27/21  Earliest Fill Date: 03/31/21       Diagnosis Association: Other osteoarthritis involving multiple joints (M15.8)  Original Order:  Oxycodone HCl 10 MG TABS [[id number]]

## 2021-05-01 ENCOUNTER — Ambulatory Visit: Payer: Medicare HMO | Admitting: Physician Assistant

## 2021-05-01 DIAGNOSIS — Z981 Arthrodesis status: Secondary | ICD-10-CM

## 2021-05-01 DIAGNOSIS — R768 Other specified abnormal immunological findings in serum: Secondary | ICD-10-CM

## 2021-05-01 DIAGNOSIS — Z8709 Personal history of other diseases of the respiratory system: Secondary | ICD-10-CM

## 2021-05-01 DIAGNOSIS — I6523 Occlusion and stenosis of bilateral carotid arteries: Secondary | ICD-10-CM

## 2021-05-01 DIAGNOSIS — I10 Essential (primary) hypertension: Secondary | ICD-10-CM

## 2021-05-01 DIAGNOSIS — M8580 Other specified disorders of bone density and structure, unspecified site: Secondary | ICD-10-CM

## 2021-05-01 DIAGNOSIS — D472 Monoclonal gammopathy: Secondary | ICD-10-CM

## 2021-05-01 DIAGNOSIS — Z8639 Personal history of other endocrine, nutritional and metabolic disease: Secondary | ICD-10-CM

## 2021-05-01 DIAGNOSIS — M1612 Unilateral primary osteoarthritis, left hip: Secondary | ICD-10-CM

## 2021-05-01 DIAGNOSIS — Z8739 Personal history of other diseases of the musculoskeletal system and connective tissue: Secondary | ICD-10-CM

## 2021-05-01 DIAGNOSIS — I517 Cardiomegaly: Secondary | ICD-10-CM

## 2021-05-01 DIAGNOSIS — G8929 Other chronic pain: Secondary | ICD-10-CM

## 2021-05-01 DIAGNOSIS — M1712 Unilateral primary osteoarthritis, left knee: Secondary | ICD-10-CM

## 2021-05-01 DIAGNOSIS — M19041 Primary osteoarthritis, right hand: Secondary | ICD-10-CM

## 2021-05-01 DIAGNOSIS — Z96651 Presence of right artificial knee joint: Secondary | ICD-10-CM

## 2021-05-02 ENCOUNTER — Other Ambulatory Visit: Payer: Self-pay | Admitting: Nurse Practitioner

## 2021-05-02 DIAGNOSIS — I10 Essential (primary) hypertension: Secondary | ICD-10-CM

## 2021-05-02 MED ORDER — LOSARTAN POTASSIUM-HCTZ 100-25 MG PO TABS: 1.0000 | ORAL_TABLET | Freq: Every day | ORAL | 1 refills | Status: DC

## 2021-05-06 NOTE — Progress Notes (Signed)
Office Visit Note  Patient: Martha Peterson             Date of Birth: 02-Apr-1941           MRN: [id number]             PCP: Martha Freshwater, NP Referring: Martha Freshwater, NP Visit Date: 05/15/2021 Occupation: @GUAROCC @  Subjective:  Pain in both hands  History of Present Illness: Martha Peterson is a 80 y.o. female with history of osteoarthritis.  Patient presents today with ongoing pain and stiffness in both hands due to ongoing osteoarthritis.  She has noticed some increased thickening and stiffness in the left CMC joint.  She crochets on occasion and has to take breaks due to the discomfort.  She has not tried using [facility] joint brace or arthritis compression gloves.  She is taking omega 3, tart cherry, ginger, and tumeric daily.  She has been taking oxycodone and occasionally will take ibuprofen for pain relief.  She is not supposed to take NSAIDs due to being on long-term Eliquis as well as due to chronic kidney disease.  She denies any recent gout flares.  She has not been taking allopurinol on a consistent basis.  She states her right knee replacement is doing well.  She states her left knee needs replacement.  She denies any other joint pain or joint swelling at this time.  She denies any new concerns.   Activities of Daily Living:  Patient reports morning stiffness for 3-4 hours.   Patient Reports nocturnal pain.  Difficulty dressing/grooming: Denies Difficulty climbing stairs: Reports Difficulty getting out of chair: Denies Difficulty using hands for taps, buttons, cutlery, and/or writing: Denies  Review of Systems  Constitutional:  Negative for fatigue.  HENT:  Negative for mouth sores, mouth dryness and nose dryness.   Eyes:  Positive for dryness. Negative for pain and itching.  Respiratory:  Negative for shortness of breath and difficulty breathing.   Cardiovascular:  Negative for chest pain and palpitations.  Gastrointestinal:  Negative for blood in stool,  constipation and diarrhea.  Endocrine: Negative for increased urination.  Genitourinary:  Negative for difficulty urinating.  Musculoskeletal:  Positive for joint pain, joint pain, joint swelling, myalgias, morning stiffness, muscle tenderness and myalgias.  Skin:  Negative for color change, rash and redness.  Allergic/Immunologic: Negative for susceptible to infections.  Neurological:  Negative for dizziness, numbness, headaches, memory loss and weakness.  Hematological:  Positive for bruising/bleeding tendency.  Psychiatric/Behavioral:  Negative for confusion.    PMFS History:  Patient Active Problem List   Diagnosis Date Noted   Hyperlipidemia, mixed 03/31/2021   Vitamin deficiency 03/31/2021   Chronic allergic rhinitis 02/01/2021   Adjustment insomnia 12/07/2020   Encounter to establish care 09/26/2020   Chronic atrial fibrillation ([place]) 09/26/2020   Heart failure with reduced ejection fraction ([place]) 09/26/2020   Thrombocytopenia, unspecified ([place]) 09/26/2020   Lymphedema 07/11/2020   Elevated glucose 06/27/2020   Chronic pain syndrome 06/27/2020   Stage 3 chronic kidney disease ([place]) 06/27/2020   Cellulitis of left lower extremity 06/27/2020   Needs flu shot 05/08/2020   Lower extremity edema 04/16/2020   [name] (nonalcoholic steatohepatitis) 04/16/2020   Encounter for general adult medical examination with abnormal findings 02/25/2020   Chronic gout without tophus 02/25/2020   Current moderate episode of major depressive disorder ([place]) 02/25/2020   Tick bite of left hip 12/06/2019   Encounter for screening mammogram for malignant neoplasm of breast 12/06/2019  Screening for osteoporosis 12/06/2019   S/P carotid endarterectomy 09/09/2019   Iron deficiency anemia 09/09/2019   Abnormal renal function 09/09/2019   Hypomagnesemia 09/09/2019   Hospital discharge follow-up 08/02/2019   Hollenhorst plaque, right eye 07/04/2019   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[place]) 07/04/2019    Left ventricular systolic dysfunction 69/48/5462   Diastolic dysfunction 70/35/0093   Degenerative joint disease involving multiple joints 81/82/9937   Nonalcoholic fatty liver disease 02/12/2019   Dysuria 02/12/2019   Encounter for long-term (current) use of medications 01/22/2019   Hypokalemia 04/27/2018   Osteopenia 04/04/2018   Hypothyroidism (acquired) 02/14/2018   Neutropenia ([place]) 02/14/2018   Primary osteoarthritis of left hip 12/06/2017   Essential hypertension 09/29/2017   Postural dizziness 09/29/2017   Carotid artery 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, bilateral 09/29/2017   Acquired left ventricular hypertrophy 09/29/2017   Inflammatory polyarthritis ([place]) 09/29/2017   Need for vaccination for Strep pneumoniae 09/29/2017   H/O ankle fusion 05/02/2016   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   Abnormal LFTs 02/13/2014   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   Primary osteoarthritis of both hands 02/13/2014   H/O: gout 02/13/2014   Rheumatoid factor positive 02/13/2014    Past Medical History:  Diagnosis Date   Arthritis    Asthma    Atrial fibrillation ([place])    per patient's daughter    COPD (chronic obstructive pulmonary disease) ([place])    Diverticulitis    Gout    Hyperlipidemia    Hypertension    Hypothyroidism    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   Microhematuria    Mild mitral regurgitation    Mild tricuspid regurgitation    Osteopenia    Pulmonary fibrosis ([place])    Urinary incontinence     Family History  Problem Relation Age of Onset   Heart Problems Mother    Heart Problems Father    Diabetes Father    Lupus Sister    Thyroid disease Sister    Heart Problems Sister    Hypertension Son    Diabetes Son        borderline    Arthritis Daughter        in the left knee    Diabetes Daughter    Neuropathy Daughter    Depression Daughter    Anxiety disorder Daughter    Past Surgical History:   Procedure Laterality Date   ABDOMINAL HYSTERECTOMY     ANKLE SURGERY Left    APPENDECTOMY     CAROTID PTA/STENT INTERVENTION N/A 07/17/2019   Procedure: CAROTID PTA/STENT INTERVENTION;  Surgeon: Algernon Huxley, MD;  Location: [facility];  Service: Cardiovascular;  Laterality: N/A;   CAROTID-SUBCLAVIAN BYPASS GRAFT N/A 07/26/2019   Procedure: BYPASS GRAFT CAROTID-SUBCLAVIAN ( CAROTID TO CAROTID BYPASS);  Surgeon: Algernon Huxley, MD;  Location: [facility] ORS;  Service: Vascular;  Laterality: N/A;   CHOLECYSTECTOMY     COLONOSCOPY WITH PROPOFOL N/A 12/04/2014   Procedure: COLONOSCOPY WITH PROPOFOL;  Surgeon: Lollie Sails, MD;  Location: [facility] ENDOSCOPY;  Service: Endoscopy;  Laterality: N/A;   FRACTURE SURGERY     Femur Fx; LT Ankle Pinning   JOINT REPLACEMENT     RT TKR   Patial Thyroid Resection     Social History   Social History Narrative   Lives at home with husband in private residence   Immunization History  Administered Date(s) Administered   Fluad Quad(high Dose 65+) 03/31/2021   Influenza Inj Mdck Quad Pf 03/18/2018, 04/14/2019,  04/16/2020   Influenza-Unspecified 05/04/2017   Moderna Sars-Covid-2 Vaccination 08/09/2019, 09/06/2019, 07/25/2020   Pneumococcal Conjugate-13 03/01/2018   Pneumococcal Polysaccharide-23 05/31/2015   Zoster Recombinat (Shingrix) 01/25/2018, 04/18/2018, 03/31/2019     Objective: Vital Signs: BP 109/74 (BP Location: Left Arm, Patient Position: Sitting, Cuff Size: Large)   Pulse (!) 102   Ht 5\' 3"  (1.6 m)   Wt 192 lb 9.6 oz (87.4 kg)   BMI 34.12 kg/m    Physical Exam Vitals and nursing note reviewed.  Constitutional:      Appearance: She is well-developed.  HENT:     Head: Normocephalic and atraumatic.  Eyes:     Conjunctiva/sclera: Conjunctivae normal.  Pulmonary:     Effort: Pulmonary effort is normal.  Abdominal:     Palpations: Abdomen is soft.  Musculoskeletal:     Cervical back: Normal range of motion.  Skin:    General:  Skin is warm and dry.     Capillary Refill: Capillary refill takes less than 2 seconds.  Neurological:     Mental Status: She is alert and oriented to person, place, and time.  Psychiatric:        Behavior: Behavior normal.     Musculoskeletal Exam: C-spine has good range of motion with no discomfort.  Thoracic kyphosis noted.  Some discomfort in the lumbar spine.  Shoulder joints have good range of motion with no discomfort.  Elbow joints have good range of motion with no tenderness or inflammation.  Wrist joints have good range of motion with no tenderness or synovitis.  No tenderness or synovitis over MCP joints.  PIP and DIP thickening consistent with osteoarthritis of both hands.  CMC joint thickening, subluxation, and tenderness bilaterally.  Hip joints have good range of motion with no groin pain.  Right knee replacement has good range of motion.  Left knee joint has limited extension with discomfort.  Left ankle joint is fused.   CDAI Exam: CDAI Score: -- Patient Global: --; Provider Global: -- Swollen: --; Tender: -- Joint Exam 05/15/2021   No joint exam has been documented for this visit   There is currently no information documented on the homunculus. Go to the Rheumatology activity and complete the homunculus joint exam.  Investigation: No additional findings.  Imaging: No results found.  Recent Labs: Lab Results  Component Value Date   WBC 3.8 03/31/2021   HGB 11.9 03/31/2021   PLT 126 (L) 03/31/2021   NA 139 03/31/2021   K 4.1 03/31/2021   CL 95 (L) 03/31/2021   CO2 29 03/31/2021   GLUCOSE 112 (H) 03/31/2021   BUN 49 (H) 03/31/2021   CREATININE 1.43 (H) 03/31/2021   BILITOT 0.6 03/31/2021   ALKPHOS 119 03/31/2021   AST 36 03/31/2021   ALT 17 03/31/2021   PROT 7.2 03/31/2021   ALBUMIN 4.4 03/31/2021   CALCIUM 9.6 03/31/2021   GFRAA 50 (L) 07/10/2020    Speciality Comments: No specialty comments available.  Procedures:  No procedures  performed Allergies: Latex, Codeine, Levaquin [levofloxacin], and Tape   Assessment / Plan:     Visit Diagnoses: Primary osteoarthritis of both hands: She presents today with CMC joint thickening and subluxation bilaterally.  She has PIP and DIP thickening consistent with osteoarthritis of both hands.  No tenderness or synovitis over wrist joints or MCP joints noted.  She has no clinical features of rheumatoid arthritis.  Her discomfort is due to underlying osteoarthritic changes.  Discussed the slow but progressive nature of osteoarthritis.  I also discussed the importance of joint protection and muscle strengthening.  She was given a handout of hand exercises to perform.  She was strongly encouraged to continue to take natural anti-inflammatories which she has been tolerating on a daily basis.  I also discussed the use of arthritis compression gloves as well as CMC joint braces which she can try in the future.  She was advised to notify us if she develops increased joint pain or joint swelling.  She will follow-up in the office in 6 months.  Rheumatoid factor positive: She has no clinical features of rheumatoid arthritis.  She has no tenderness or synovitis over wrist joints or MCP joints.  She has intermittent discomfort and stiffness in both hands due to underlying osteoarthritis.  She has CMC joint thickening and subluxation bilaterally as well as PIP and DIP thickening. She was advised to notify us if she develops increased joint pain or joint swelling.  Primary osteoarthritis of left hip: She has good range of motion of the left hip with no groin pain.  Status post total knee replacement, right: Doing well.  She has good range of motion of the right knee replacement with no discomfort.  No warmth or effusion was noted.  Primary osteoarthritis of left knee: Chronic pain.  She has painful limited extension of the left knee on examination.  According to the patient she needs a left knee replacement  but does not want to proceed with surgery at this time.  She has been using a cane to assist with ambulation.  She has not had any recent falls.  History of ankle fusion: Left.   Chronic midline low back pain without sciatica: She experiences occasional discomfort in her lower back.  She takes oxycodone as needed for pain relief.  No symptoms of sciatica at this time.  History of gout - She has not had any signs or symptoms of a gout flare recently.  She has not been taking allopurinol on a consistent basis.  She has been taking allopurinol as needed if she develops signs or symptoms of a gout flare.  Discussed that allopurinol can actually exacerbate an acute gout flare if not taken on a daily basis as prescribed.  She voiced understanding.  History of osteopenia - DEXA from 12/19/19  proximal left femur measures 0.674 gm/cm2.  The Z score is -0.1 and the T score is -2.2.  She is taking a vitamin D supplement daily.  No recent falls or fractures.  Other medical conditions are listed as follows:   Essential hypertension:BP was 109/74.   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 Grayland Ormond.  According to patient's daughter she has been discharged from hematology as her M spike has been stable.  History of COPD  History of hypothyroidism  History of pulmonary fibrosis  Orders: No orders of the defined types were placed in this encounter.  No orders of the defined types were placed in this encounter.    Follow-Up Instructions: Return in about 6 months (around 11/13/2021) for Osteoarthritis.   Ofilia Neas, PA-C  Note - This record has been created using Dragon software.  Chart creation errors have been sought, but may not always  have been located. Such creation errors do not reflect on  the standard of medical care.

## 2021-05-15 ENCOUNTER — Encounter: Payer: Self-pay | Admitting: Physician Assistant

## 2021-05-15 ENCOUNTER — Other Ambulatory Visit: Payer: Self-pay

## 2021-05-15 ENCOUNTER — Ambulatory Visit: Payer: Medicare HMO | Admitting: Physician Assistant

## 2021-05-15 VITALS — BP 109/74 | HR 102 | Ht 63.0 in | Wt 192.6 lb

## 2021-05-15 DIAGNOSIS — Z8639 Personal history of other endocrine, nutritional and metabolic disease: Secondary | ICD-10-CM

## 2021-05-15 DIAGNOSIS — M1712 Unilateral primary osteoarthritis, left knee: Secondary | ICD-10-CM

## 2021-05-15 DIAGNOSIS — Z96651 Presence of right artificial knee joint: Secondary | ICD-10-CM | POA: Diagnosis not present

## 2021-05-15 DIAGNOSIS — M19041 Primary osteoarthritis, right hand: Secondary | ICD-10-CM

## 2021-05-15 DIAGNOSIS — M1612 Unilateral primary osteoarthritis, left hip: Secondary | ICD-10-CM

## 2021-05-15 DIAGNOSIS — R768 Other specified abnormal immunological findings in serum: Secondary | ICD-10-CM

## 2021-05-15 DIAGNOSIS — M545 Low back pain, unspecified: Secondary | ICD-10-CM | POA: Diagnosis not present

## 2021-05-15 DIAGNOSIS — D472 Monoclonal gammopathy: Secondary | ICD-10-CM

## 2021-05-15 DIAGNOSIS — M19042 Primary osteoarthritis, left hand: Secondary | ICD-10-CM

## 2021-05-15 DIAGNOSIS — Z981 Arthrodesis status: Secondary | ICD-10-CM

## 2021-05-15 DIAGNOSIS — Z8709 Personal history of other diseases of the respiratory system: Secondary | ICD-10-CM

## 2021-05-15 DIAGNOSIS — Z8739 Personal history of other diseases of the musculoskeletal system and connective tissue: Secondary | ICD-10-CM

## 2021-05-15 DIAGNOSIS — I10 Essential (primary) hypertension: Secondary | ICD-10-CM | POA: Diagnosis not present

## 2021-05-15 DIAGNOSIS — G8929 Other chronic pain: Secondary | ICD-10-CM

## 2021-05-15 DIAGNOSIS — I6523 Occlusion and stenosis of bilateral carotid arteries: Secondary | ICD-10-CM

## 2021-05-15 DIAGNOSIS — I517 Cardiomegaly: Secondary | ICD-10-CM

## 2021-05-15 NOTE — Patient Instructions (Signed)
Hand Exercises Hand exercises can be helpful for almost anyone. These exercises can strengthen the hands, improve flexibility and movement, and increase blood flow to the hands. These results can make work and daily tasks easier. Hand exercises can be especially helpful for people who have joint pain from arthritis or have nerve damage from overuse (carpal tunnel syndrome). These exercises can also help people who have injured a hand. Exercises Most of these hand exercises are gentle stretching and motion exercises. It is usually safe to do them often throughout the day. Warming up your hands before exercise may help to reduce stiffness. You can do this with gentle massage or by placing your hands in warm water for 10-15 minutes. It is normal to feel some stretching, pulling, tightness, or mild discomfort as you begin new exercises. This will gradually improve. Stop an exercise right away if you feel sudden, severe pain or your pain gets worse. Ask your health care provider which exercises are best for you. Knuckle bend or "claw" fist  Stand or sit with your arm, hand, and all five fingers pointed straight up. Make sure to keep your wrist straight during the exercise. Gently bend your fingers down toward your palm until the tips of your fingers are touching the top of your palm. Keep your big knuckle straight and just bend the small knuckles in your fingers. Hold this position for __________ seconds. Straighten (extend) your fingers back to the starting position. Repeat this exercise 5-10 times with each hand. Full finger fist  Stand or sit with your arm, hand, and all five fingers pointed straight up. Make sure to keep your wrist straight during the exercise. Gently bend your fingers into your palm until the tips of your fingers are touching the middle of your palm. Hold this position for __________ seconds. Extend your fingers back to the starting position, stretching every joint fully. Repeat  this exercise 5-10 times with each hand. Straight fist Stand or sit with your arm, hand, and all five fingers pointed straight up. Make sure to keep your wrist straight during the exercise. Gently bend your fingers at the big knuckle, where your fingers meet your hand, and the middle knuckle. Keep the knuckle at the tips of your fingers straight and try to touch the bottom of your palm. Hold this position for __________ seconds. Extend your fingers back to the starting position, stretching every joint fully. Repeat this exercise 5-10 times with each hand. Tabletop  Stand or sit with your arm, hand, and all five fingers pointed straight up. Make sure to keep your wrist straight during the exercise. Gently bend your fingers at the big knuckle, where your fingers meet your hand, as far down as you can while keeping the small knuckles in your fingers straight. Think of forming a tabletop with your fingers. Hold this position for __________ seconds. Extend your fingers back to the starting position, stretching every joint fully. Repeat this exercise 5-10 times with each hand. Finger spread  Place your hand flat on a table with your palm facing down. Make sure your wrist stays straight as you do this exercise. Spread your fingers and thumb apart from each other as far as you can until you feel a gentle stretch. Hold this position for __________ seconds. Bring your fingers and thumb tight together again. Hold this position for __________ seconds. Repeat this exercise 5-10 times with each hand. Making circles  Stand or sit with your arm, hand, and all five fingers pointed   straight up. Make sure to keep your wrist straight during the exercise. Make a circle by touching the tip of your thumb to the tip of your index finger. Hold for __________ seconds. Then open your hand wide. Repeat this motion with your thumb and each finger on your hand. Repeat this exercise 5-10 times with each hand. Thumb  motion  Sit with your forearm resting on a table and your wrist straight. Your thumb should be facing up toward the ceiling. Keep your fingers relaxed as you move your thumb. Lift your thumb up as high as you can toward the ceiling. Hold for __________ seconds. Bend your thumb across your palm as far as you can, reaching the tip of your thumb for the small finger (pinkie) side of your palm. Hold for __________ seconds. Repeat this exercise 5-10 times with each hand. Grip strengthening  Hold a stress ball or other soft ball in the middle of your hand. Slowly increase the pressure, squeezing the ball as much as you can without causing pain. Think of bringing the tips of your fingers into the middle of your palm. All of your finger joints should bend when doing this exercise. Hold your squeeze for __________ seconds, then relax. Repeat this exercise 5-10 times with each hand. Contact a health care provider if: Your hand pain or discomfort gets much worse when you do an exercise. Your hand pain or discomfort does not improve within 2 hours after you exercise. If you have any of these problems, stop doing these exercises right away. Do not do them again unless your health care provider says that you can. Get help right away if: You develop sudden, severe hand pain or swelling. If this happens, stop doing these exercises right away. Do not do them again unless your health care provider says that you can. This information is not intended to replace advice given to you by your health care provider. Make sure you discuss any questions you have with your health care provider. Document Revised: 09/19/2020 Document Reviewed: 09/19/2020 Elsevier Patient Education  2022 [employer]  

## 2021-05-29 ENCOUNTER — Telehealth: Payer: Self-pay | Admitting: Nurse Practitioner

## 2021-05-29 NOTE — Telephone Encounter (Signed)
lmtc

## 2021-05-29 NOTE — Telephone Encounter (Signed)
Patient called requesting something for a sinus infection. Symptoms include runny nose, sore throat from drainage-has been taking mucinex just not helping. She does have an appointment with you on Monday. Please advise. [phone]

## 2021-05-30 ENCOUNTER — Encounter: Payer: Self-pay | Admitting: Nurse Practitioner

## 2021-05-30 ENCOUNTER — Ambulatory Visit (INDEPENDENT_AMBULATORY_CARE_PROVIDER_SITE_OTHER): Payer: Medicare HMO | Admitting: Nurse Practitioner

## 2021-05-30 VITALS — Ht 63.0 in | Wt 192.0 lb

## 2021-05-30 DIAGNOSIS — M064 Inflammatory polyarthropathy: Secondary | ICD-10-CM | POA: Diagnosis not present

## 2021-05-30 DIAGNOSIS — M158 Other polyosteoarthritis: Secondary | ICD-10-CM

## 2021-05-30 DIAGNOSIS — M1612 Unilateral primary osteoarthritis, left hip: Secondary | ICD-10-CM | POA: Diagnosis not present

## 2021-05-30 DIAGNOSIS — J0100 Acute maxillary sinusitis, unspecified: Secondary | ICD-10-CM | POA: Diagnosis not present

## 2021-05-30 MED ORDER — AMOXICILLIN-POT CLAVULANATE 875-125 MG PO TABS: 1.0000 | ORAL_TABLET | Freq: Two times a day (BID) | ORAL | 0 refills | Status: DC

## 2021-05-30 MED ORDER — OXYCODONE ER 18 MG PO C12A: 1.0000 | EXTENDED_RELEASE_CAPSULE | Freq: Two times a day (BID) | ORAL | 0 refills | Status: DC

## 2021-05-30 MED ORDER — OXYCODONE HCL 10 MG PO TABS: 10.0000 mg | ORAL_TABLET | Freq: Every day | ORAL | 0 refills | Status: DC | PRN

## 2021-05-30 NOTE — Progress Notes (Signed)
Virtual Visit via Telephone Note  I connected with Martha Peterson on 05/30/21 at  8:20 AM EST by telephone and verified that I am speaking with the correct person using two identifiers.  Location: Patient: home Provider: [facility]     I discussed the limitations, risks, security and privacy concerns of performing an evaluation and management service by telephone and the availability of in person appointments. I also discussed with the patient that there may be a patient responsible charge related to this service. The patient expressed understanding and agreed to proceed.   History of Present Illness:  The patient states that on Monday she started having nasal congestion. This quickly devleoped intomaxillary sinus pain and pain behind the eyes. She has had sore throat, and headaches. She believes she had a low grade fever on Tuesday but this has subsided. She does have a decreased appetite and denies nausea, vomiting. R diarrhea. She has been taking OTC mucinex and sinus nasal spray. She states states that these medications have been mildly effective. She states that symptoms are moderate and getting worse.  The patient is scheduled for a routine visit on Monday 06/02/2021 to discuss treatments for chronic pain and renew pain medications. She reports doing well with current medications.   breakthrough pain is limitied and managed well with current dose of IR oxycodone.  Indication for chronic opioid: chronic pain and inflammatory polyarthritis. Patient unable to take NSAIDs due to treatment with blood thinners. 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  Medication and dose: Oxycodone XR 18mg  twice daily and oxycodone 10mg  QD prn for breakthrough pain  # pills per month: 60 oxycodone XR and 30 oxycodone 10mg  Last UDS date: 03/31/2021 Opioid Treatment Agreement signed (Y/N): yes Opioid Treatment Agreement last reviewed with  patient:  03/31/2021 NCCSRS reviewed this encounter (include red flags): no  I did review her PDMP profile 05/30/2021. Her overdose risk score is 150. Her fill history is appropriate on 05/02/2021.    Observations/Objective:  The patient is alert and oriented. She is pleasant and answers all questions appropriately. Breathing is non-labored. She is in no acute distress at this time.  She is nasally congested.   Today's Vitals   05/30/21 0804  Weight: 192 lb (87.1 kg)  Height: 5\' 3"  (1.6 m)   Body mass index is 34.01 kg/m.   Assessment and Plan: 1. Acute non-recurrent maxillary sinusitis Start augmentin 875mg  twice daily for 7 days. Rest and increase fluids. Continue using OTC medication to control symptoms.   - amoxicillin-clavulanate (AUGMENTIN) 875-125 MG tablet; Take 1 tablet by mouth 2 (two) times daily.  Dispense: 14 tablet; Refill: 0  2. Inflammatory polyarthritis ([place]) Improved control on oxycodone R 18mg  twice daily. A single 30 day prescription sent to pharmacy today.  - oxyCODONE ER 18 MG C12A; Take 1 tablet by mouth 2 (two) times daily.  Dispense: 60 capsule; Refill: 0  3. Other osteoarthritis involving multiple joints Breakthrough pain managed with oxycodone 10mg  - generally breaks in half if needed a single 30 day prescription was sent to her pharmacy today.  - Oxycodone HCl 10 MG TABS; Take 1 tablet (10 mg total) by mouth daily as needed.  Dispense: 30 tablet; Refill: 0   Follow Up Instructions:    I discussed the assessment and treatment plan with the patient. The patient was provided an opportunity to ask questions and all were answered. The patient agreed with the plan and  demonstrated an understanding of the instructions.   The patient was advised to call back or seek an in-person evaluation if the symptoms worsen or if the condition fails to improve as anticipated.  I provided 15 minutes of non-face-to-face time during this encounter.   Ronnell Freshwater, NP

## 2021-06-02 ENCOUNTER — Ambulatory Visit: Payer: Medicare HMO | Admitting: Nurse Practitioner

## 2021-06-03 ENCOUNTER — Other Ambulatory Visit (INDEPENDENT_AMBULATORY_CARE_PROVIDER_SITE_OTHER): Payer: Self-pay | Admitting: Vascular Surgery

## 2021-06-03 ENCOUNTER — Other Ambulatory Visit: Payer: Self-pay

## 2021-06-03 ENCOUNTER — Ambulatory Visit (INDEPENDENT_AMBULATORY_CARE_PROVIDER_SITE_OTHER): Payer: Medicare HMO | Admitting: Vascular Surgery

## 2021-06-03 ENCOUNTER — Ambulatory Visit (INDEPENDENT_AMBULATORY_CARE_PROVIDER_SITE_OTHER): Payer: Medicare HMO

## 2021-06-03 VITALS — BP 129/78 | HR 132 | Ht 63.0 in | Wt 182.0 lb

## 2021-06-03 DIAGNOSIS — I771 Stricture of artery: Secondary | ICD-10-CM

## 2021-06-03 DIAGNOSIS — I779 Disorder of arteries and arterioles, unspecified: Secondary | ICD-10-CM

## 2021-06-03 DIAGNOSIS — H34211 Partial retinal artery occlusion, right eye: Secondary | ICD-10-CM

## 2021-06-03 DIAGNOSIS — K760 Fatty (change of) liver, not elsewhere classified: Secondary | ICD-10-CM

## 2021-06-03 DIAGNOSIS — I10 Essential (primary) hypertension: Secondary | ICD-10-CM

## 2021-06-03 NOTE — Progress Notes (Signed)
MRN : [id number]  Martha Peterson is a 80 y.o. (1941-04-21) female who presents with chief complaint of  Chief Complaint  Patient presents with   Follow-up    1 yr carotid  .  History of Present Illness: Patient returns in follow-up.  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  She has done very well from this no further focal neurologic deficits.  She does have some right arm claudication symptoms that are stable.  She does not have any ulceration or infection of the arm.  Her incisions are all well-healed and she currently has no neck pain or issues.  Duplex today shows her bypass to be widely patent with very mild carotid artery disease bilaterally  Current Outpatient Medications  Medication Sig Dispense Refill   allopurinol (ZYLOPRIM) 100 MG tablet TAKE 2 TABLETS EVERY DAY 180 tablet 1   amoxicillin-clavulanate (AUGMENTIN) 875-125 MG tablet Take 1 tablet by mouth 2 (two) times daily. 14 tablet 0   apixaban (ELIQUIS) 5 MG TABS tablet Take 1 tablet (5 mg total) by mouth 2 (two) times daily. 60 tablet 5   ascorbic acid (VITAMIN C) 500 MG tablet Take 500 mg by mouth daily.     aspirin EC 81 MG tablet Take 81 mg by mouth daily.     atorvastatin (LIPITOR) 10 MG tablet Take 1 tablet (10 mg total) by mouth daily at 6 PM. 90 tablet 1   carvedilol (COREG) 6.25 MG tablet Take 1 tablet (6.25 mg total) by mouth 2 (two) times daily with a meal. 180 tablet 1   cholecalciferol (VITAMIN D) 400 UNITS TABS tablet Take 1,000 Units by mouth.     diclofenac sodium (VOLTAREN) 1 % GEL Apply 3 grams to 3 large joints, up to 3 times daily as needed. 3 Tube 3   fluticasone (FLONASE) 50 MCG/ACT nasal spray Place 2 sprays into both nostrils daily. (Patient taking differently: Place 2 sprays into both nostrils as needed.) 16 g 6   Ginger, Zingiber officinalis, (GINGER PO) Take by mouth.     IBU 600 MG tablet TAKE 1 TABLET EVERY  8 HOURS AS NEEDED 60 tablet 1   levocetirizine (XYZAL) 5 MG tablet Take 1 tablet (5 mg total) by mouth every evening. 90 tablet 1   levothyroxine (SYNTHROID) 137 MCG tablet Take 1 tablet (137 mcg total) by mouth daily before breakfast. 90 tablet 1   losartan-hydrochlorothiazide (HYZAAR) 100-25 MG tablet Take 1 tablet by mouth daily. (Patient taking differently: Take 0.5 tablets by mouth daily.) 90 tablet 1   magnesium oxide (MAG-OX) 400 MG tablet Take 400 mg by mouth daily.      mirtazapine (REMERON) 7.5 MG tablet Take 1 to 2 tablets po QPM 180 tablet 1   Misc Natural Products (TART CHERRY ADVANCED PO) Take by mouth daily.     Multiple Vitamins-Minerals (PRESERVISION AREDS 2 PO) Take by mouth 2 (two) times daily.     omega-3 acid ethyl esters (LOVAZA) 1 G capsule Take 1 g by mouth 2 (two) times daily.     oxyCODONE ER 18 MG C12A Take 1 tablet by mouth 2 (two) times daily. 60 capsule 0   Oxycodone HCl 10 MG TABS Take 1 tablet (10 mg total) by mouth daily as needed. 30 tablet 0   potassium chloride (KLOR-CON) 10 MEQ tablet Take 1 tablet (10 mEq total) by mouth daily. 90 tablet 1  predniSONE (DELTASONE) 5 MG tablet Take 2 tablets (10 mg total) by mouth daily with breakfast. (Patient taking differently: Take 10 mg by mouth as needed.) 180 tablet 1   tiZANidine (ZANAFLEX) 4 MG tablet TAKE 1 TABLET TWICE DAILY 180 tablet 0   torsemide (DEMADEX) 20 MG tablet TAKE 1 TABLET EVERY DAY (DISCONTINUE FUROSEMIDE) 90 tablet 1   traZODone (DESYREL) 50 MG tablet Take 0.5-1 tablets (25-50 mg total) by mouth at bedtime as needed for sleep. 30 tablet 2   TURMERIC PO Take by mouth daily.     vitamin E 400 UNIT capsule Take 800 Units by mouth daily.      No current facility-administered medications for this visit.    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   Microhematuria    Mild mitral regurgitation    Mild tricuspid regurgitation    Osteopenia    Pulmonary fibrosis (HCC)    Urinary incontinence     Past Surgical History:  Procedure Laterality Date   ABDOMINAL HYSTERECTOMY     ANKLE SURGERY Left    APPENDECTOMY     CAROTID PTA/STENT INTERVENTION N/A 07/17/2019   Procedure: CAROTID PTA/STENT INTERVENTION;  Surgeon: Algernon Huxley, MD;  Location: [facility];  Service: Cardiovascular;  Laterality: N/A;   CAROTID-SUBCLAVIAN BYPASS GRAFT N/A 07/26/2019   Procedure: BYPASS GRAFT CAROTID-SUBCLAVIAN ( CAROTID TO CAROTID BYPASS);  Surgeon: Algernon Huxley, MD;  Location: [facility];  Service: Vascular;  Laterality: N/A;   CHOLECYSTECTOMY     COLONOSCOPY WITH PROPOFOL N/A 12/04/2014   Procedure: COLONOSCOPY WITH PROPOFOL;  Surgeon: Lollie Sails, MD;  Location[facility];  Service: Endoscopy;  Laterality: N/A;   FRACTURE SURGERY     Femur Fx; LT Ankle Pinning   JOINT REPLACEMENT     RT TKR   Patial Thyroid Resection       Social History   Tobacco Use   Smoking status: Former    Years: 1.00    Types: Cigarettes   Smokeless tobacco: Never  Vaping Use   Vaping Use: Never used  Substance Use Topics   Alcohol use: No   Drug use: No      Family History  Problem Relation Age of Onset   Heart Problems Mother    Heart Problems Father    Diabetes Father    Lupus Sister    Thyroid disease Sister    Heart Problems Sister    Hypertension Son    Diabetes Son        borderline    Arthritis Daughter        in the left knee    Diabetes Daughter    Neuropathy Daughter    Depression Daughter    Anxiety disorder Daughter     Allergies  Allergen Reactions   Latex Hives, Itching and Rash   Codeine    Levaquin [Levofloxacin]    Tape     latex    REVIEW OF SYSTEMS (Negative unless checked)    Constitutional: [] Weight loss  [] Fever  [] Chills Cardiac: [] Chest pain   [] Chest pressure   [] Palpitations   [] Shortness of breath when laying flat   []   Shortness of breath at rest   [x] Shortness of breath with exertion. Vascular:  [] Pain in legs with walking   [] Pain in legs at rest   [] Pain in legs when laying flat   [] Claudication   [] Pain in feet when walking  [] Pain in feet at rest  [] Pain in feet when laying flat   [] History of DVT   [] Phlebitis   [] Swelling in legs   [] Varicose veins   [] Non-healing ulcers Pulmonary:   [] Uses home oxygen   [] Productive cough   [] Hemoptysis   [] Wheeze  [x] COPD   [x] Asthma Neurologic:  [] Dizziness  [] Blackouts   [] Seizures   [] History of stroke   [] History of TIA  [] Aphasia   [] Temporary blindness   [] Dysphagia   [] Weakness or numbness in arms   [] Weakness or numbness in legs Musculoskeletal:  [x] Arthritis   [] Joint swelling   [] Joint pain   [] Low back pain Hematologic:  [] Easy bruising  [] Easy bleeding   [] Hypercoagulable state   [] Anemic  [] Hepatitis Gastrointestinal:  [] Blood in stool   [] Vomiting blood  [x] Gastroesophageal reflux/heartburn   [] Abdominal pain Genitourinary:  [] Chronic kidney disease   [] Difficult urination  [x] Frequent urination  [] Burning with urination   [] Hematuria Skin:  [] Rashes   [] Ulcers   [] Wounds Psychological:  [] History of anxiety   []  History of major depression.  Physical Examination  Vitals:   06/03/21 1450  BP: 129/78  Pulse: (!) 132  Weight: 182 lb (82.6 kg)  Height: 5\' 3"  (1.6 m)   Body mass index is 32.24 kg/m. Gen:  WD/WN, NAD. Appears younger than stated age. Head: [place]/AT, No temporalis wasting. Ear/Nose/Throat: Hearing grossly intact, nares w/o erythema or drainage, trachea midline Eyes: Conjunctiva clear. Sclera non-icteric Neck: Supple.  No bruit  Pulmonary:  Good air movement, equal and clear to auscultation bilaterally.  Cardiac: RRR, No JVD Vascular:  Vessel Right Left  Radial Not Palpable Palpable        Musculoskeletal: M/S 5/5 throughout.  No deformity or atrophy. No edema. Neurologic: CN 2-12 intact. Sensation grossly intact in extremities.  Symmetrical.  Speech is fluent. Motor exam as listed above. Psychiatric: Judgment intact, Mood & affect appropriate for pt's clinical situation. Dermatologic: No rashes or ulcers noted.  No cellulitis or open wounds.     CBC Lab Results  Component Value Date   WBC 3.8 03/31/2021   HGB 11.9 03/31/2021   HCT 35.4 03/31/2021   MCV 97 03/31/2021   PLT 126 (L) 03/31/2021    BMET    Component Value Date/Time   NA 139 03/31/2021 1109   NA 135 (L) 02/02/2013 1038   K 4.1 03/31/2021 1109   K 3.8 02/02/2013 1038   CL 95 (L) 03/31/2021 1109   CL 102 02/02/2013 1038   CO2 29 03/31/2021 1109   CO2 29 02/02/2013 1038   GLUCOSE 112 (H) 03/31/2021 1109   GLUCOSE 106 (H) 10/16/2019 1102   GLUCOSE 111 (H) 02/02/2013 1038   BUN 49 (H) 03/31/2021 1109   BUN 15 02/02/2013 1038   CREATININE 1.43 (H) 03/31/2021 1109   CREATININE 0.99 02/20/2014 1019   CALCIUM 9.6 03/31/2021 1109   CALCIUM 8.9 02/20/2014 1019   GFRNONAA 44 (L) 07/10/2020 1148   GFRNONAA 56 (L) 02/20/2014 1019   GFRAA 50 (L) 07/10/2020 1148   GFRAA >60 02/20/2014 1019   CrCl cannot be calculated (Patient's most recent lab result is older than the maximum 21 days allowed.).  COAG Lab Results  Component Value Date   INR  1.1 07/21/2019   INR 1.06 12/04/2014    Radiology No results found.   Assessment/Plan Nonalcoholic fatty liver disease Her cardiologist has desired to go up on her statin, but due to her liver issues she has not been able to tolerate this.  Essential hypertension blood pressure control important in reducing the progression of atherosclerotic disease. On appropriate oral medications.  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  Widely patent bypass on duplex.  Continue current medical regimen.   Recheck in 12 months.  Does have some right arm claudication type symptoms and we discussed that we could try to get across this percutaneously into a stent now that her mid common carotid artery is ligated we would not have to worry about embolization distally.  She says that her symptoms are not severe enough to warrant that at this point but we will keep an eye on this.   Leotis Pain, MD  06/03/2021 3:38 PM    This note was created with Dragon medical transcription system.  Any errors from dictation are purely unintentional

## 2021-06-20 ENCOUNTER — Other Ambulatory Visit: Payer: Self-pay | Admitting: Nurse Practitioner

## 2021-06-20 DIAGNOSIS — M064 Inflammatory polyarthropathy: Secondary | ICD-10-CM

## 2021-06-20 DIAGNOSIS — M1612 Unilateral primary osteoarthritis, left hip: Secondary | ICD-10-CM

## 2021-06-23 ENCOUNTER — Other Ambulatory Visit: Payer: Self-pay | Admitting: Nurse Practitioner

## 2021-06-23 DIAGNOSIS — J0100 Acute maxillary sinusitis, unspecified: Secondary | ICD-10-CM

## 2021-06-23 DIAGNOSIS — J309 Allergic rhinitis, unspecified: Secondary | ICD-10-CM

## 2021-06-23 DIAGNOSIS — F321 Major depressive disorder, single episode, moderate: Secondary | ICD-10-CM

## 2021-06-30 ENCOUNTER — Other Ambulatory Visit: Payer: Self-pay

## 2021-06-30 ENCOUNTER — Encounter: Payer: Self-pay | Admitting: Nurse Practitioner

## 2021-06-30 ENCOUNTER — Ambulatory Visit (INDEPENDENT_AMBULATORY_CARE_PROVIDER_SITE_OTHER): Payer: Medicare HMO | Admitting: Nurse Practitioner

## 2021-06-30 VITALS — BP 129/64 | HR 99 | Temp 97.7°F | Ht 63.0 in | Wt 183.8 lb

## 2021-06-30 DIAGNOSIS — M158 Other polyosteoarthritis: Secondary | ICD-10-CM

## 2021-06-30 DIAGNOSIS — I482 Chronic atrial fibrillation, unspecified: Secondary | ICD-10-CM | POA: Diagnosis not present

## 2021-06-30 DIAGNOSIS — I6523 Occlusion and stenosis of bilateral carotid arteries: Secondary | ICD-10-CM

## 2021-06-30 DIAGNOSIS — I10 Essential (primary) hypertension: Secondary | ICD-10-CM

## 2021-06-30 DIAGNOSIS — Z6831 Body mass index (BMI) 31.0-31.9, adult: Secondary | ICD-10-CM | POA: Insufficient documentation

## 2021-06-30 DIAGNOSIS — J449 Chronic obstructive pulmonary disease, unspecified: Secondary | ICD-10-CM | POA: Diagnosis not present

## 2021-06-30 DIAGNOSIS — M064 Inflammatory polyarthropathy: Secondary | ICD-10-CM

## 2021-06-30 DIAGNOSIS — K7581 Nonalcoholic steatohepatitis (NASH): Secondary | ICD-10-CM | POA: Diagnosis not present

## 2021-06-30 DIAGNOSIS — Z6832 Body mass index (BMI) 32.0-32.9, adult: Secondary | ICD-10-CM

## 2021-06-30 DIAGNOSIS — J8410 Pulmonary fibrosis, unspecified: Secondary | ICD-10-CM | POA: Diagnosis not present

## 2021-06-30 MED ORDER — OXYCODONE HCL 10 MG PO TABS: 10.0000 mg | ORAL_TABLET | Freq: Every day | ORAL | 0 refills | Status: DC | PRN

## 2021-06-30 MED ORDER — OXYCODONE ER 18 MG PO C12A: 1.0000 | EXTENDED_RELEASE_CAPSULE | Freq: Two times a day (BID) | ORAL | 0 refills | Status: DC

## 2021-06-30 NOTE — Patient Instructions (Signed)
Fat and Cholesterol Restricted Eating Plan Getting too much fat and cholesterol in your diet may cause health problems. Choosing the right foods helps keep your fat and cholesterol at normal levels. This can keep you from getting certain diseases. Your doctor may recommend an eating plan that includes: Total fat: ______% or less of total calories a day. This is ______g of fat a day. Saturated fat: ______% or less of total calories a day. This is ______g of saturated fat a day. Cholesterol: less than _________mg a day. Fiber: ______g a day. What are tips for following this plan? General tips Work with your doctor to lose weight if you need to. Avoid: Foods with added sugar. Fried foods. Foods with trans fat or partially hydrogenated oils. This includes some margarines and baked goods. If you drink alcohol: Limit how much you have to: 0-1 drink a day for women who are not pregnant. 0-2 drinks a day for men. Know how much alcohol is in a drink. In the [place] one drink equals one 12 oz bottle of beer (355 mL), one 5 oz glass of wine (148 mL), or one 1 oz glass of hard liquor (44 mL). Reading food labels Check food labels for: Trans fats. Partially hydrogenated oils. Saturated fat (g) in each serving. Cholesterol (mg) in each serving. Fiber (g) in each serving. Choose foods with healthy fats, such as: Monounsaturated fats and polyunsaturated fats. These include olive and canola oil, flaxseeds, walnuts, almonds, and seeds. Omega-3 fats. These are found in certain fish, flaxseed oil, and ground flaxseeds. Choose grain products that have whole grains. Look for the word "whole" as the first word in the ingredient list. Cooking Cook foods using low-fat methods. These include baking, boiling, grilling, and broiling. Eat more home-cooked foods. Eat at restaurants and buffets less often. Eat less fast food. Avoid cooking using saturated fats, such as butter, cream, palm oil, palm kernel oil, and  coconut oil. Meal planning  At meals, divide your plate into four equal parts: Fill one-half of your plate with vegetables, green salads, and fruit. Fill one-fourth of your plate with whole grains. Fill one-fourth of your plate with low-fat (lean) protein foods. Eat fish that is high in omega-3 fats at least two times a week. This includes mackerel, tuna, sardines, and salmon. Eat foods that are high in fiber, such as whole grains, beans, apples, pears, berries, broccoli, carrots, peas, and barley. What foods should I eat? Fruits All fresh, canned (in natural juice), or frozen fruits. Vegetables Fresh or frozen vegetables (raw, steamed, roasted, or grilled). Green salads. Grains Whole grains, such as whole wheat or whole grain breads, crackers, cereals, and pasta. Unsweetened oatmeal, bulgur, barley, quinoa, or brown rice. Corn or whole wheat flour tortillas. Meats and other protein foods Ground beef (85% or leaner), grass-fed beef, or beef trimmed of fat. Skinless chicken or turkey. Ground chicken or turkey. Pork trimmed of fat. All fish and seafood. Egg whites. Dried beans, peas, or lentils. Unsalted nuts or seeds. Unsalted canned beans. Nut butters without added sugar or oil. Dairy Low-fat or nonfat dairy products, such as skim or 1% milk, 2% or reduced-fat cheeses, low-fat and fat-free ricotta or cottage cheese, or plain low-fat and nonfat yogurt. Fats and oils Tub margarine without trans fats. Light or reduced-fat mayonnaise and salad dressings. Avocado. Olive, canola, sesame, or safflower oils. The items listed above may not be a complete list of foods and beverages you can eat. Contact a dietitian for more information. What foods   should I avoid? Fruits Canned fruit in heavy syrup. Fruit in cream or butter sauce. Fried fruit. Vegetables Vegetables cooked in cheese, cream, or butter sauce. Fried vegetables. Grains White bread. White pasta. White rice. Cornbread. Bagels, pastries,  and croissants. Crackers and snack foods that contain trans fat and hydrogenated oils. Meats and other protein foods Fatty cuts of meat. Ribs, chicken wings, bacon, sausage, bologna, salami, chitterlings, fatback, hot dogs, bratwurst, and packaged lunch meats. Liver and organ meats. Whole eggs and egg yolks. Chicken and turkey with skin. Fried meat. Dairy Whole or 2% milk, cream, half-and-half, and cream cheese. Whole milk cheeses. Whole-fat or sweetened yogurt. Full-fat cheeses. Nondairy creamers and whipped toppings. Processed cheese, cheese spreads, and cheese curds. Fats and oils Butter, stick margarine, lard, shortening, ghee, or bacon fat. Coconut, palm kernel, and palm oils. Beverages Alcohol. Sugar-sweetened drinks such as sodas, lemonade, and fruit drinks. Sweets and desserts Corn syrup, sugars, honey, and molasses. Candy. Jam and jelly. Syrup. Sweetened cereals. Cookies, pies, cakes, donuts, muffins, and ice cream. The items listed above may not be a complete list of foods and beverages you should avoid. Contact a dietitian for more information. Summary Choosing the right foods helps keep your fat and cholesterol at normal levels. This can keep you from getting certain diseases. At meals, fill one-half of your plate with vegetables, green salads, and fruits. Eat high fiber foods, like whole grains, beans, apples, pears, berries, carrots, peas, and barley. Limit added sugar, saturated fats, alcohol, and fried foods. This information is not intended to replace advice given to you by your health care provider. Make sure you discuss any questions you have with your health care provider. Document Revised: 10/11/2020 Document Reviewed: 10/11/2020 Elsevier Patient Education  2022 Elsevier Inc.  

## 2021-06-30 NOTE — Progress Notes (Signed)
Established Patient Office Visit  Subjective:  Patient ID: Martha Peterson, female    DOB: 04/05/1941  Age: 81 y.o. MRN: [id number]  CC:  Chief Complaint  Patient presents with   Follow-up    HPI [name] presents for routine follow up.  Doing well with current prescriptions for pain management.  She is now taking oxycodone ER 18 mg twice daily.  Oxycodone 10 mg daily if needed for breakthrough pain.  Indication for chronic opioid: chronic pain and inflammatory polyarthritis. Patient unable to take NSAIDs due to treatment with blood thinners. 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  Medication and dose: Oxycodone XR 18 mg twice daily and oxycodone 29m QD prn for breakthrough pain  # pills per month: 60 oxycodone XR and 30 oxycodone 158mLast UDS date: 03/31/2021 Opioid Treatment Agreement signed (Y/N): yes Opioid Treatment Agreement last reviewed with patient:  03/31/2021 N[facility] reviewed this encounter (include red flags): no  Her PDMP profile was reviewed during today's visit.  Her overdose was score is 140.  Last fill for both IR and ER oxycodone was 05/30/2021. Did have new carotid doppler done in late December with vein and vascular. Was told test looked good. Will go back for follow up in one year. Patient has no new concerns or complaints today.  Feels that in general, she is doing very well.  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   Microhematuria    Mild mitral regurgitation    Mild tricuspid regurgitation    Osteopenia    Pulmonary fibrosis (HCC)    Urinary incontinence     Past Surgical History:  Procedure Laterality Date   ABDOMINAL  HYSTERECTOMY     ANKLE SURGERY Left    APPENDECTOMY     CAROTID PTA/STENT INTERVENTION N/A 07/17/2019   Procedure: CAROTID PTA/STENT INTERVENTION;  Surgeon: DeAlgernon HuxleyMD;  [facility];  Service: Cardiovascular;  Laterality: N/A;   CAROTID-SUBCLAVIAN BYPASS GRAFT N/A 07/26/2019   Procedure: BYPASS GRAFT CAROTID-SUBCLAVIAN ( CAROTID TO CAROTID BYPASS);  Surgeon: DeAlgernon HuxleyMD;  Location: [facility];  Service: Vascular;  Laterality: N/A;   CHOLECYSTECTOMY     COLONOSCOPY WITH PROPOFOL N/A 12/04/2014   Procedure: COLONOSCOPY WITH PROPOFOL;  Surgeon: MaLollie SailsMD;  [facility];  Service: Endoscopy;  Laterality: N/A;   FRACTURE SURGERY     Femur Fx; LT Ankle Pinning   JOINT REPLACEMENT     RT TKR   Patial Thyroid Resection      Family History  Problem Relation Age of Onset   Heart Problems Mother    Heart Problems Father    Diabetes Father    Lupus Sister    Thyroid disease Sister    Heart Problems Sister    Hypertension Son    Diabetes Son        borderline    Arthritis Daughter        in the left knee    Diabetes Daughter    Neuropathy Daughter    Depression Daughter    Anxiety disorder Daughter     Social History  Socioeconomic History   Marital status: Widowed    Spouse name: Jeneen Rinks   Number of children: Not on file   Years of education: Not on file   Highest education level: Not on file  Occupational History   Not on file  Tobacco Use   Smoking status: Former    Years: 1.00    Types: Cigarettes   Smokeless tobacco: Never  Vaping Use   Vaping Use: Never used  Substance and Sexual Activity   Alcohol use: No   Drug use: No   Sexual activity: Not Currently  Other Topics Concern   Not on file  Social History Narrative   Lives at home with husband in private residence   Social Determinants of Health   Financial Resource Strain: Not on file  Food Insecurity: Not on file  Transportation Needs: Not on file  Physical  Activity: Not on file  Stress: Not on file  Social Connections: Not on file  Intimate Partner Violence: Not on file    Outpatient Medications Prior to Visit  Medication Sig Dispense Refill   allopurinol (ZYLOPRIM) 100 MG tablet TAKE 2 TABLETS EVERY DAY 180 tablet 1   apixaban (ELIQUIS) 5 MG TABS tablet Take 1 tablet (5 mg total) by mouth 2 (two) times daily. 60 tablet 5   ascorbic acid (VITAMIN C) 500 MG tablet Take 500 mg by mouth daily.     aspirin EC 81 MG tablet Take 81 mg by mouth daily.     atorvastatin (LIPITOR) 10 MG tablet Take 1 tablet (10 mg total) by mouth daily at 6 PM. 90 tablet 1   carvedilol (COREG) 6.25 MG tablet TAKE 1 TABLET TWICE DAILY WITH MEALS 180 tablet 1   cholecalciferol (VITAMIN D) 400 UNITS TABS tablet Take 1,000 Units by mouth.     diclofenac sodium (VOLTAREN) 1 % GEL Apply 3 grams to 3 large joints, up to 3 times daily as needed. 3 Tube 3   fluticasone (FLONASE) 50 MCG/ACT nasal spray Place 2 sprays into both nostrils daily. (Patient taking differently: Place 2 sprays into both nostrils as needed.) 16 g 6   Ginger, Zingiber officinalis, (GINGER PO) Take by mouth.     IBU 600 MG tablet TAKE 1 TABLET EVERY 8 HOURS AS NEEDED 60 tablet 1   levocetirizine (XYZAL) 5 MG tablet TAKE 1 TABLET EVERY EVENING 90 tablet 1   levothyroxine (SYNTHROID) 137 MCG tablet Take 1 tablet (137 mcg total) by mouth daily before breakfast. 90 tablet 1   losartan-hydrochlorothiazide (HYZAAR) 100-25 MG tablet Take 1 tablet by mouth daily. (Patient taking differently: Take 0.5 tablets by mouth daily.) 90 tablet 1   magnesium oxide (MAG-OX) 400 MG tablet Take 400 mg by mouth daily.      mirtazapine (REMERON) 7.5 MG tablet TAKE 1 TO 2 TABLETS EVERY EVENING 180 tablet 1   Misc Natural Products (TART CHERRY ADVANCED PO) Take by mouth daily.     Multiple Vitamins-Minerals (PRESERVISION AREDS 2 PO) Take by mouth 2 (two) times daily.     omega-3 acid ethyl esters (LOVAZA) 1 G capsule Take 1 g by  mouth 2 (two) times daily.     potassium chloride (KLOR-CON) 10 MEQ tablet Take 1 tablet (10 mEq total) by mouth daily. 90 tablet 1   predniSONE (DELTASONE) 5 MG tablet TAKE 2 TABLETS EVERY DAY WITH BREAKFAST 180 tablet 1   tiZANidine (ZANAFLEX) 4 MG tablet TAKE 1 TABLET TWICE DAILY 180 tablet 0   torsemide (DEMADEX) 20 MG  tablet TAKE 1 TABLET EVERY DAY (DISCONTINUE FUROSEMIDE) 90 tablet 1   traZODone (DESYREL) 50 MG tablet Take 0.5-1 tablets (25-50 mg total) by mouth at bedtime as needed for sleep. 30 tablet 2   TURMERIC PO Take by mouth daily.     vitamin E 400 UNIT capsule Take 800 Units by mouth daily.      amoxicillin-clavulanate (AUGMENTIN) 875-125 MG tablet Take 1 tablet by mouth 2 (two) times daily. 14 tablet 0   oxyCODONE ER 18 MG C12A Take 1 tablet by mouth 2 (two) times daily. 60 capsule 0   Oxycodone HCl 10 MG TABS Take 1 tablet (10 mg total) by mouth daily as needed. 30 tablet 0   No facility-administered medications prior to visit.    Allergies  Allergen Reactions   Latex Hives, Itching and Rash   Codeine    Levaquin [Levofloxacin]    Tape     latex    ROS Review of Systems  Constitutional:  Negative for activity change, chills, fatigue and fever.  HENT:  Negative for congestion, postnasal drip, rhinorrhea, sinus pressure and sinus pain.   Eyes: Negative.   Respiratory:  Positive for shortness of breath. Negative for cough, chest tightness and wheezing.   Cardiovascular:  Positive for leg swelling. Negative for chest pain and palpitations.       Chronic a fib  Gastrointestinal:  Negative for constipation, diarrhea, nausea and vomiting.  Endocrine: Negative for cold intolerance, heat intolerance, polydipsia and polyuria.  Musculoskeletal:  Positive for arthralgias, back pain, joint swelling and myalgias.       Back pain and generalized joint pain well managed with current medication. She has tolerated this well and has no negative side effects.   Skin:  Negative for  rash.  Allergic/Immunologic: Positive for environmental allergies.  Neurological:  Negative for dizziness, weakness and headaches.  Psychiatric/Behavioral:  Positive for dysphoric mood and sleep disturbance. The patient is nervous/anxious.        States that she sleeps better with slightly higher dose of mirtazapine.     Objective:    Physical Exam Vitals and nursing note reviewed.  Constitutional:      Appearance: Normal appearance. She is well-developed.  HENT:     Head: Normocephalic and atraumatic.     Nose: Nose normal.     Mouth/Throat:     Mouth: Mucous membranes are moist.  Eyes:     Extraocular Movements: Extraocular movements intact.     Conjunctiva/sclera: Conjunctivae normal.     Pupils: Pupils are equal, round, and reactive to light.  Cardiovascular:     Rate and Rhythm: Normal rate. Rhythm irregular.     Pulses: Normal pulses.     Heart sounds: Murmur heard.  Pulmonary:     Effort: Pulmonary effort is normal.     Breath sounds: Normal breath sounds.  Abdominal:     Palpations: Abdomen is soft.  Musculoskeletal:        General: Normal range of motion.     Cervical back: Normal range of motion and neck supple.     Right lower leg: Edema present.     Left lower leg: Edema present.     Comments:  Moderate lower back pain, worse with bending and twisting at the waist. Exertion increases pain. Left foot deformity with swelling present and unchanged. ROM and strength of the left foot is diminished but at baseline.  The patient is using a walking cane to help her with mobility and balance.  She does have chronic bilateral lower extremity edema.  Currently edema is at baseline.  Lymphadenopathy:     Cervical: No cervical adenopathy.  Skin:    General: Skin is warm and dry.     Capillary Refill: Capillary refill takes less than 2 seconds.  Neurological:     General: No focal deficit present.     Mental Status: She is alert and oriented to person, place, and time.   Psychiatric:        Mood and Affect: Mood normal.        Behavior: Behavior normal.        Thought Content: Thought content normal.        Judgment: Judgment normal.   Today's Vitals   06/30/21 1126  BP: 129/64  Pulse: 99  Temp: 97.7 F (36.5 C)  SpO2: 94%  Weight: 183 lb 12.8 oz (83.4 kg)   Body mass index is 32.56 kg/m.   Wt Readings from Last 3 Encounters:  06/30/21 183 lb 12.8 oz (83.4 kg)  06/03/21 182 lb (82.6 kg)  05/30/21 192 lb (87.1 kg)     Health Maintenance Due  Topic Date Due   TETANUS/TDAP  Never done   COVID-19 Vaccine (4 - Booster for Moderna series) 09/19/2020    There are no preventive care reminders to display for this patient.  Lab Results  Component Value Date   TSH 1.600 03/31/2021   Lab Results  Component Value Date   WBC 3.8 03/31/2021   HGB 11.9 03/31/2021   HCT 35.4 03/31/2021   MCV 97 03/31/2021   PLT 126 (L) 03/31/2021   Lab Results  Component Value Date   NA 139 03/31/2021   K 4.1 03/31/2021   CO2 29 03/31/2021   GLUCOSE 112 (H) 03/31/2021   BUN 49 (H) 03/31/2021   CREATININE 1.43 (H) 03/31/2021   BILITOT 0.6 03/31/2021   ALKPHOS 119 03/31/2021   AST 36 03/31/2021   ALT 17 03/31/2021   PROT 7.2 03/31/2021   ALBUMIN 4.4 03/31/2021   CALCIUM 9.6 03/31/2021   ANIONGAP 12 10/16/2019   EGFR 37 (L) 03/31/2021   Lab Results  Component Value Date   CHOL 101 03/31/2021   Lab Results  Component Value Date   HDL 41 03/31/2021   Lab Results  Component Value Date   LDLCALC 40 03/31/2021   Lab Results  Component Value Date   TRIG 104 03/31/2021   Lab Results  Component Value Date   CHOLHDL 2.5 03/31/2021   Lab Results  Component Value Date   HGBA1C 5.9 (H) 03/31/2021      Assessment & Plan:  1. Essential hypertension Blood pressure well managed.  Continue medication as prescribed.  2. Carotid artery 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, bilateral Recent carotid Doppler looks good.  She is to see vein and  vascular in one year  3. Chronic atrial fibrillation (HCC) Stable.  Continue regular visits with cardiology.  4. Inflammatory polyarthritis ([place]) Patient doing well with oxycodone ER 15 mg.  May continue to take twice daily to control pain.  Patient and her daughter are both aware of risk factors associated with taking narcotic pain medications for long periods of time. Patient exhibiting no red flags for continued treatment.  - oxyCODONE ER 18 MG C12A; Take 1 tablet by mouth 2 (two) times daily.  Dispense: 60 capsule; Refill: 0  5. Other osteoarthritis involving multiple joints May take oxycodone 42m daily as needed for breakthrough pain.  - Oxycodone HCl 10  MG TABS; Take 1 tablet (10 mg total) by mouth daily as needed.  Dispense: 30 tablet; Refill: 0  6.  [name] (nonalcoholic steatohepatitis) Patient with chronic liver disease. Currently stable. Continue to monitor.   7. Body mass index (BMI) of 32.0-32.9 in adult Discussed lowering calorie intake to 1500 calories per day and incorporating exercise into daily routine to help lose weight.   Problem List Items Addressed This Visit       Cardiovascular and Mediastinum   Essential hypertension - Primary   Carotid artery 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, bilateral   Chronic atrial fibrillation (HCC)     Digestive   [name] (nonalcoholic steatohepatitis)     Musculoskeletal and Integument   Inflammatory polyarthritis (HCC)   Relevant Medications   oxyCODONE ER 18 MG C12A   Oxycodone HCl 10 MG TABS   Degenerative joint disease involving multiple joints   Relevant Medications   oxyCODONE ER 18 MG C12A   Oxycodone HCl 10 MG TABS     Other   Body mass index (BMI) of 32.0-32.9 in adult    Meds ordered this encounter  Medications   oxyCODONE ER 18 MG C12A    Sig: Take 1 tablet by mouth 2 (two) times daily.    Dispense:  60 capsule    Refill:  0    Order Specific Question:   Supervising Provider    Answer:   Beatrice Lecher D [[id number]]   Oxycodone HCl 10 MG TABS    Sig: Take 1 tablet (10 mg total) by mouth daily as needed.    Dispense:  30 tablet    Refill:  0    Order Specific Question:   Supervising Provider    Answer:   Beatrice Lecher D [[id number]]    Follow-up: Return in about 2 months (around 08/28/2021) for med refills.    Ronnell Freshwater, NP

## 2021-07-28 ENCOUNTER — Other Ambulatory Visit: Payer: Self-pay | Admitting: Nurse Practitioner

## 2021-07-28 DIAGNOSIS — E876 Hypokalemia: Secondary | ICD-10-CM

## 2021-07-31 ENCOUNTER — Telehealth: Payer: Self-pay | Admitting: Cardiology

## 2021-07-31 MED ORDER — APIXABAN 5 MG PO TABS: 5.0000 mg | ORAL_TABLET | Freq: Two times a day (BID) | ORAL | 5 refills | Status: DC

## 2021-07-31 NOTE — Telephone Encounter (Signed)
°*  STAT* If patient is at the pharmacy, call can be transferred to refill team.   1. Which medications need to be refilled? (please list name of each medication and dose if known) eliquis 5 mg po BID   2. Which pharmacy/location (including street and city if local pharmacy) is medication to be sent to? [facility] [place]   3. Do they need a 30 day or 90 day supply? [place]

## 2021-07-31 NOTE — Telephone Encounter (Signed)
Pt last saw Dr Garen Lah 02/24/21, last labs 03/31/21 Creat 1.43, age 82, weight 83.4kg, based on specified criteria pt is on appropriate dosage of Eliquis 5mg  BID for afib.  Will refill rx.

## 2021-08-19 ENCOUNTER — Other Ambulatory Visit: Payer: Self-pay | Admitting: Nurse Practitioner

## 2021-08-19 DIAGNOSIS — M064 Inflammatory polyarthropathy: Secondary | ICD-10-CM

## 2021-08-19 DIAGNOSIS — M1612 Unilateral primary osteoarthritis, left hip: Secondary | ICD-10-CM

## 2021-08-20 ENCOUNTER — Other Ambulatory Visit: Payer: Self-pay | Admitting: Nurse Practitioner

## 2021-08-28 ENCOUNTER — Other Ambulatory Visit: Payer: Self-pay

## 2021-08-28 ENCOUNTER — Ambulatory Visit (INDEPENDENT_AMBULATORY_CARE_PROVIDER_SITE_OTHER): Payer: Medicare HMO | Admitting: Nurse Practitioner

## 2021-08-28 ENCOUNTER — Encounter: Payer: Self-pay | Admitting: Nurse Practitioner

## 2021-08-28 VITALS — BP 115/69 | HR 98 | Temp 97.8°F | Ht 62.99 in | Wt 176.8 lb

## 2021-08-28 DIAGNOSIS — M064 Inflammatory polyarthropathy: Secondary | ICD-10-CM | POA: Diagnosis not present

## 2021-08-28 DIAGNOSIS — K7581 Nonalcoholic steatohepatitis (NASH): Secondary | ICD-10-CM | POA: Diagnosis not present

## 2021-08-28 DIAGNOSIS — M158 Other polyosteoarthritis: Secondary | ICD-10-CM

## 2021-08-28 DIAGNOSIS — N289 Disorder of kidney and ureter, unspecified: Secondary | ICD-10-CM

## 2021-08-28 DIAGNOSIS — E039 Hypothyroidism, unspecified: Secondary | ICD-10-CM | POA: Diagnosis not present

## 2021-08-28 DIAGNOSIS — I482 Chronic atrial fibrillation, unspecified: Secondary | ICD-10-CM | POA: Diagnosis not present

## 2021-08-28 DIAGNOSIS — I10 Essential (primary) hypertension: Secondary | ICD-10-CM

## 2021-08-28 MED ORDER — OXYCODONE HCL 10 MG PO TABS: 10.0000 mg | ORAL_TABLET | Freq: Every day | ORAL | 0 refills | Status: DC | PRN

## 2021-08-28 MED ORDER — OXYCODONE ER 18 MG PO C12A: 1.0000 | EXTENDED_RELEASE_CAPSULE | Freq: Two times a day (BID) | ORAL | 0 refills | Status: DC

## 2021-08-28 NOTE — Progress Notes (Signed)
Established patient visit ? ? ?Patient: Martha Peterson   DOB: 1941-04-12   81 y.o. Female  MRN: [id number] ?Visit Date: 08/28/2021 ? ? ?Chief Complaint  ?Patient presents with  ? Medication Refill  ? ?Subjective  ?  ?HPI  ?Martha Peterson presents for routine follow up.  ?Doing well with current prescriptions for pain management.  She is now taking oxycodone ER 18 mg twice daily.  Oxycodone 10 mg daily if needed for breakthrough pain.  ?Indication for chronic opioid: chronic pain and inflammatory polyarthritis. Patient unable to take NSAIDs due to treatment with blood thinners. 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  ?Medication and dose: Oxycodone XR 18 mg twice daily and oxycodone '10mg'$  QD prn for breakthrough pain  ?# pills per month: 60 oxycodone XR and 30 oxycodone '10mg'$  ?Last UDS date: 03/31/2021 ?Opioid Treatment Agreement signed (Y/N): yes ?Opioid Treatment Agreement last reviewed with patient:  03/31/2021 ?[place] reviewed this encounter (include red flags): no  ?Her PDMP profile was reviewed during today's visit.  Her overdose was score is 130.  Last fill for both IR oxycodone was 07/30/2021 and oxycodone ER was 08/04/2021.  ?-she has no new concerns or complaints today.  ? ? ?Medications: ?Outpatient Medications Prior to Visit  ?Medication Sig  ? allopurinol (ZYLOPRIM) 100 MG tablet TAKE 2 TABLETS EVERY DAY  ? apixaban (ELIQUIS) 5 MG TABS tablet Take 1 tablet (5 mg total) by mouth 2 (two) times daily.  ? ascorbic acid (VITAMIN C) 500 MG tablet Take 500 mg by mouth daily.  ? aspirin EC 81 MG tablet Take 81 mg by mouth daily.  ? atorvastatin (LIPITOR) 10 MG tablet TAKE 1 TABLET EVERY DAY AT 6PM  ? carvedilol (COREG) 6.25 MG tablet TAKE 1 TABLET TWICE DAILY WITH MEALS  ? cholecalciferol (VITAMIN D) 400 UNITS TABS tablet Take 1,000 Units by mouth.  ? diclofenac sodium (VOLTAREN) 1 % GEL Apply 3 grams to 3 large joints, up to 3 times daily as needed.  ? fluticasone  (FLONASE) 50 MCG/ACT nasal spray Place 2 sprays into both nostrils daily. (Patient taking differently: Place 2 sprays into both nostrils as needed.)  ? Ginger, Zingiber officinalis, (GINGER PO) Take by mouth.  ? IBU 600 MG tablet TAKE 1 TABLET EVERY 8 HOURS AS NEEDED  ? levocetirizine (XYZAL) 5 MG tablet TAKE 1 TABLET EVERY EVENING  ? levothyroxine (SYNTHROID) 137 MCG tablet Take 1 tablet (137 mcg total) by mouth daily before breakfast.  ? losartan-hydrochlorothiazide (HYZAAR) 100-25 MG tablet Take 1 tablet by mouth daily. (Patient taking differently: Take 0.5 tablets by mouth daily.)  ? magnesium oxide (MAG-OX) 400 MG tablet Take 400 mg by mouth daily.   ? mirtazapine (REMERON) 7.5 MG tablet TAKE 1 TO 2 TABLETS EVERY EVENING  ? Misc Natural Products (TART CHERRY ADVANCED PO) Take by mouth daily.  ? Multiple Vitamins-Minerals (PRESERVISION AREDS 2 PO) Take by mouth 2 (two) times daily.  ? omega-3 acid ethyl esters (LOVAZA) 1 G capsule Take 1 g by mouth 2 (two) times daily.  ? potassium chloride (KLOR-CON) 10 MEQ tablet TAKE 1 TABLET EVERY DAY  ? predniSONE (DELTASONE) 5 MG tablet TAKE 2 TABLETS EVERY DAY WITH BREAKFAST  ? tiZANidine (ZANAFLEX) 4 MG tablet TAKE 1 TABLET TWICE DAILY  ? torsemide (DEMADEX) 20 MG tablet TAKE 1 TABLET EVERY DAY (DISCONTINUE FUROSEMIDE)  ? traZODone (DESYREL) 50 MG tablet Take 0.5-1 tablets (25-50 mg total) by mouth at bedtime as needed for sleep.  ?  TURMERIC PO Take by mouth daily.  ? vitamin E 400 UNIT capsule Take 800 Units by mouth daily.   ? [DISCONTINUED] oxyCODONE ER 18 MG C12A Take 1 tablet by mouth 2 (two) times daily.  ? [DISCONTINUED] Oxycodone HCl 10 MG TABS Take 1 tablet (10 mg total) by mouth daily as needed.  ? ?No facility-administered medications prior to visit.  ? ? ?Review of Systems  ?Constitutional:  Negative for activity change, appetite change, chills, fatigue and fever.  ?HENT:  Negative for congestion, postnasal drip, rhinorrhea, sinus pressure, sinus pain,  sneezing and sore throat.   ?Eyes: Negative.   ?Respiratory:  Negative for cough, chest tightness, shortness of breath and wheezing.   ?Cardiovascular:  Positive for leg swelling. Negative for chest pain and palpitations.  ?     Chronic a fib. Improved leg swelling.   ?Gastrointestinal:  Negative for abdominal pain, constipation, diarrhea, nausea and vomiting.  ?Endocrine: Negative for cold intolerance, heat intolerance, polydipsia and polyuria.  ?Genitourinary:  Negative for dyspareunia, dysuria, flank pain, frequency and urgency.  ?Musculoskeletal:  Positive for joint swelling. Negative for arthralgias, back pain and myalgias.  ?     Back pain and generalized joint pain well managed with current medication. She has tolerated this well and has no negative side effects.   ?Skin:  Negative for rash.  ?Allergic/Immunologic: Negative for environmental allergies.  ?Neurological:  Negative for dizziness, weakness and headaches.  ?Hematological:  Negative for adenopathy.  ?Psychiatric/Behavioral:  Positive for dysphoric mood and sleep disturbance. The patient is not nervous/anxious.   ?     Stable with current medications  ? ? ? ? Objective  ?  ? ?Today's Vitals  ? 08/28/21 1619  ?BP: 115/69  ?Pulse: 98  ?Temp: 97.8 ?F (36.6 ?C)  ?Weight: 176 lb 12.8 oz (80.2 kg)  ?Height: 5' 2.99" (1.6 m)  ? ?Body mass index is 31.33 kg/m?.  ? ?BP Readings from Last 3 Encounters:  ?08/28/21 115/69  ?06/30/21 129/64  ?06/03/21 129/78  ?  ?Wt Readings from Last 3 Encounters:  ?08/28/21 176 lb 12.8 oz (80.2 kg)  ?06/30/21 183 lb 12.8 oz (83.4 kg)  ?06/03/21 182 lb (82.6 kg)  ?  ?Physical Exam ?Vitals and nursing note reviewed.  ?Constitutional:   ?   Appearance: Normal appearance. She is well-developed.  ?HENT:  ?   Head: Normocephalic and atraumatic.  ?   Nose: Nose normal.  ?   Mouth/Throat:  ?   Mouth: Mucous membranes are moist.  ?   Pharynx: Oropharynx is clear.  ?Eyes:  ?   Extraocular Movements: Extraocular movements intact.  ?    Conjunctiva/sclera: Conjunctivae normal.  ?   Pupils: Pupils are equal, round, and reactive to light.  ?Neck:  ?   Vascular: No carotid bruit.  ?Cardiovascular:  ?   Rate and Rhythm: Normal rate. Rhythm irregular.  ?   Pulses: Normal pulses.  ?   Heart sounds: Murmur heard.  ?Pulmonary:  ?   Effort: Pulmonary effort is normal.  ?   Breath sounds: Normal breath sounds.  ?Abdominal:  ?   Palpations: Abdomen is soft.  ?Musculoskeletal:     ?   General: Normal range of motion.  ?   Cervical back: Normal range of motion and neck supple.  ?   Comments: Moderate lower back pain, worse with bending and twisting at the waist. Exertion increases pain. Left foot deformity with swelling present and unchanged. ROM and strength of the left foot is  diminished but at baseline.  The patient is using a walking cane to help her with mobility and balance.  ?She does have chronic bilateral lower extremity edema.  Currently edema is at baseline.  ?  ?Lymphadenopathy:  ?   Cervical: No cervical adenopathy.  ?Skin: ?   General: Skin is warm and dry.  ?   Capillary Refill: Capillary refill takes less than 2 seconds.  ?Neurological:  ?   General: No focal deficit present.  ?   Mental Status: She is alert and oriented to person, place, and time.  ?Psychiatric:     ?   Mood and Affect: Mood normal.     ?   Behavior: Behavior normal.     ?   Thought Content: Thought content normal.     ?   Judgment: Judgment normal.  ?  ? ? Assessment & Plan  ?  ?1. Essential hypertension ?Stabe. Continue bp medication as prescribed  ? ?2. Chronic atrial fibrillation (HCC) ?Stable. Continue regular visits with cardiology as scheduled  ? ?3. Inflammatory polyarthritis ([place]) ?May continue with oxycodone ER '18mg'$  twice daily. Two 30 day prescriptions were sent to her pharmacy. Dates are 08/28/2021 and 09/26/2021. ?- oxyCODONE ER 18 MG C12A; Take 1 tablet by mouth 2 (two) times daily.  Dispense: 60 capsule; Refill: 0 ? ?4. Other osteoarthritis involving multiple  joints ?May continue oxycodone '10mg'$ , taking 1/2 to 1 tablet daily as needed for breakthrough pain. Two 30 day prescriptions were sent to her pharmacy today. Dates are 08/28/2021 and 09/26/2021.  ?- Oxycodone HCl 10 MG T

## 2021-09-24 ENCOUNTER — Other Ambulatory Visit: Payer: Self-pay | Admitting: Nurse Practitioner

## 2021-09-24 DIAGNOSIS — I10 Essential (primary) hypertension: Secondary | ICD-10-CM

## 2021-09-24 DIAGNOSIS — Z8739 Personal history of other diseases of the musculoskeletal system and connective tissue: Secondary | ICD-10-CM

## 2021-09-24 DIAGNOSIS — M791 Myalgia, unspecified site: Secondary | ICD-10-CM

## 2021-10-02 ENCOUNTER — Other Ambulatory Visit: Payer: Self-pay | Admitting: Nurse Practitioner

## 2021-10-02 DIAGNOSIS — E039 Hypothyroidism, unspecified: Secondary | ICD-10-CM

## 2021-10-18 ENCOUNTER — Other Ambulatory Visit: Payer: Self-pay | Admitting: Nurse Practitioner

## 2021-10-18 DIAGNOSIS — M1612 Unilateral primary osteoarthritis, left hip: Secondary | ICD-10-CM

## 2021-10-18 DIAGNOSIS — M064 Inflammatory polyarthropathy: Secondary | ICD-10-CM

## 2021-10-28 ENCOUNTER — Ambulatory Visit (INDEPENDENT_AMBULATORY_CARE_PROVIDER_SITE_OTHER): Payer: Medicare HMO | Admitting: Nurse Practitioner

## 2021-10-28 ENCOUNTER — Encounter: Payer: Self-pay | Admitting: Nurse Practitioner

## 2021-10-28 VITALS — BP 104/63 | HR 93 | Temp 97.7°F | Ht 62.99 in | Wt 180.1 lb

## 2021-10-28 DIAGNOSIS — M158 Other polyosteoarthritis: Secondary | ICD-10-CM

## 2021-10-28 DIAGNOSIS — M064 Inflammatory polyarthropathy: Secondary | ICD-10-CM | POA: Diagnosis not present

## 2021-10-28 DIAGNOSIS — I10 Essential (primary) hypertension: Secondary | ICD-10-CM | POA: Diagnosis not present

## 2021-10-28 DIAGNOSIS — Z6831 Body mass index (BMI) 31.0-31.9, adult: Secondary | ICD-10-CM

## 2021-10-28 DIAGNOSIS — I6523 Occlusion and stenosis of bilateral carotid arteries: Secondary | ICD-10-CM

## 2021-10-28 DIAGNOSIS — I482 Chronic atrial fibrillation, unspecified: Secondary | ICD-10-CM | POA: Diagnosis not present

## 2021-10-28 DIAGNOSIS — R748 Abnormal levels of other serum enzymes: Secondary | ICD-10-CM | POA: Diagnosis not present

## 2021-10-28 DIAGNOSIS — R944 Abnormal results of kidney function studies: Secondary | ICD-10-CM | POA: Insufficient documentation

## 2021-10-28 MED ORDER — OXYCODONE HCL 10 MG PO TABS: 10.0000 mg | ORAL_TABLET | Freq: Every day | ORAL | 0 refills | Status: DC | PRN

## 2021-10-28 MED ORDER — OXYCODONE ER 18 MG PO C12A: 1.0000 | EXTENDED_RELEASE_CAPSULE | Freq: Two times a day (BID) | ORAL | 0 refills | Status: DC

## 2021-10-28 NOTE — Progress Notes (Signed)
Established patient visit ? ? ?Patient: Martha Peterson   DOB: 12-13-1940   81 y.o. Female  MRN: [id number] ?Visit Date: 10/28/2021 ? ? ?Chief Complaint  ?Patient presents with  ? Blood Pressure Check  ? ?Subjective  ?  ?HPI  ?[name] presents for routine follow up.  ?Doing well with current prescriptions for pain management.  She is now taking oxycodone ER 18 mg twice daily.  Oxycodone 10 mg daily if needed for breakthrough pain.  ?Indication for chronic opioid: chronic pain and inflammatory polyarthritis. Patient unable to take NSAIDs due to treatment with blood thinners. 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  ?Medication and dose: Oxycodone XR 18 mg twice daily and oxycodone '10mg'$  QD prn for breakthrough pain  ?# pills per month: 60 oxycodone XR and 30 oxycodone '10mg'$  ?Last UDS date: 03/31/2021 ?Opioid Treatment Agreement signed (Y/N): yes ?Opioid Treatment Agreement last reviewed with patient:  03/31/2021 ?[place] reviewed this encounter (include red flags): no  ?PDMP profile reviewed today 10/28/2021 ?-overdose risk score is 120 ?-last fill of Oxycodone ER '18mg'$  was 10/01/2021.  ?-last fill Oxycodone '10mg'$  09/27/2021.  ? ?Continues to see vein and vascular due to carotid artery atherosclerosis.  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 ?Sees cardiology due to chronic a fib ?Has had chronically elevated renal functions. Due to have recheck today. Will consider new abdominal ultrasound based on these results.  ? ?Medications: ?Outpatient Medications Prior to Visit  ?Medication Sig  ? allopurinol (ZYLOPRIM) 100 MG tablet TAKE 2 TABLETS EVERY DAY  ? apixaban (ELIQUIS) 5 MG TABS tablet Take 1 tablet (5 mg total) by mouth 2 (two) times daily.  ? ascorbic acid (VITAMIN C) 500 MG tablet Take 500 mg by mouth daily.  ? aspirin EC 81 MG tablet Take 81 mg by mouth daily.  ? atorvastatin (LIPITOR) 10 MG tablet TAKE 1 TABLET EVERY DAY AT 6PM  ? carvedilol (COREG) 6.25 MG tablet TAKE 1 TABLET TWICE  DAILY WITH MEALS  ? cholecalciferol (VITAMIN D) 400 UNITS TABS tablet Take 1,000 Units by mouth.  ? diclofenac sodium (VOLTAREN) 1 % GEL Apply 3 grams to 3 large joints, up to 3 times daily as needed.  ? fluticasone (FLONASE) 50 MCG/ACT nasal spray Place 2 sprays into both nostrils daily. (Patient taking differently: Place 2 sprays into both nostrils as needed.)  ? Ginger, Zingiber officinalis, (GINGER PO) Take by mouth.  ? IBU 600 MG tablet TAKE 1 TABLET EVERY 8 HOURS AS NEEDED  ? levocetirizine (XYZAL) 5 MG tablet TAKE 1 TABLET EVERY EVENING  ? levothyroxine (SYNTHROID) 137 MCG tablet TAKE 1 TABLET EVERY DAY BEFORE BREAKFAST  ? losartan-hydrochlorothiazide (HYZAAR) 100-25 MG tablet TAKE 1 TABLET EVERY DAY  ? magnesium oxide (MAG-OX) 400 MG tablet Take 400 mg by mouth daily.   ? mirtazapine (REMERON) 7.5 MG tablet TAKE 1 TO 2 TABLETS EVERY EVENING  ? Misc Natural Products (TART CHERRY ADVANCED PO) Take by mouth daily.  ? Multiple Vitamins-Minerals (PRESERVISION AREDS 2 PO) Take by mouth 2 (two) times daily.  ? omega-3 acid ethyl esters (LOVAZA) 1 G capsule Take 1 g by mouth 2 (two) times daily.  ? potassium chloride (KLOR-CON) 10 MEQ tablet TAKE 1 TABLET EVERY DAY  ? predniSONE (DELTASONE) 5 MG tablet TAKE 2 TABLETS EVERY DAY WITH BREAKFAST  ? tiZANidine (ZANAFLEX) 4 MG tablet TAKE 1 TABLET TWICE DAILY  ? torsemide (DEMADEX) 20 MG tablet TAKE 1 TABLET EVERY DAY (DISCONTINUE FUROSEMIDE)  ? traZODone (  DESYREL) 50 MG tablet Take 0.5-1 tablets (25-50 mg total) by mouth at bedtime as needed for sleep.  ? TURMERIC PO Take by mouth daily.  ? vitamin E 400 UNIT capsule Take 800 Units by mouth daily.   ? [DISCONTINUED] oxyCODONE ER 18 MG C12A Take 1 tablet by mouth 2 (two) times daily.  ? [DISCONTINUED] Oxycodone HCl 10 MG TABS Take 1 tablet (10 mg total) by mouth daily as needed.  ? ?No facility-administered medications prior to visit.  ? ? ?Review of Systems  ?Constitutional:  Negative for activity change, appetite  change, chills, fatigue and fever.  ?HENT:  Negative for congestion, postnasal drip, rhinorrhea, sinus pressure, sinus pain, sneezing and sore throat.   ?Eyes: Negative.   ?Respiratory:  Negative for cough, chest tightness, shortness of breath and wheezing.   ?Cardiovascular:  Positive for leg swelling. Negative for chest pain and palpitations.  ?     Chronic a-fib  ?Gastrointestinal:  Negative for abdominal pain, constipation, diarrhea, nausea and vomiting.  ?Endocrine: Negative for cold intolerance, heat intolerance, polydipsia and polyuria.  ?     Well managed hypothyroid   ?Genitourinary:  Negative for dyspareunia, dysuria, flank pain, frequency and urgency.  ?Musculoskeletal:  Positive for gait problem. Negative for arthralgias, back pain and myalgias.  ?     Back pain and generalized joint pain well managed with current medication. She has tolerated this well and has no negative side effects.   ?  ?Skin:  Negative for rash.  ?Allergic/Immunologic: Negative for environmental allergies.  ?Neurological:  Negative for dizziness, weakness and headaches.  ?Hematological:  Negative for adenopathy.  ?Psychiatric/Behavioral:  The patient is not nervous/anxious.   ? ? ? ? Objective  ?  ? ?Today's Vitals  ? 10/28/21 1618  ?BP: 104/63  ?Pulse: 93  ?Temp: 97.7 ?F (36.5 ?C)  ?SpO2: 95%  ?Weight: 180 lb 1.9 oz (81.7 kg)  ?Height: 5' 2.99" (1.6 m)  ? ?Body mass index is 31.91 kg/m?.  ? ?BP Readings from Last 3 Encounters:  ?10/28/21 104/63  ?08/28/21 115/69  ?06/30/21 129/64  ?  ?Wt Readings from Last 3 Encounters:  ?10/28/21 180 lb 1.9 oz (81.7 kg)  ?08/28/21 176 lb 12.8 oz (80.2 kg)  ?06/30/21 183 lb 12.8 oz (83.4 kg)  ?  ?Physical Exam ?Vitals and nursing note reviewed.  ?Constitutional:   ?   Appearance: Normal appearance. She is well-developed.  ?HENT:  ?   Head: Normocephalic and atraumatic.  ?   Nose: Nose normal.  ?   Mouth/Throat:  ?   Mouth: Mucous membranes are moist.  ?   Pharynx: Oropharynx is clear.  ?Eyes:  ?    Extraocular Movements: Extraocular movements intact.  ?   Conjunctiva/sclera: Conjunctivae normal.  ?   Pupils: Pupils are equal, round, and reactive to light.  ?Neck:  ?   Comments: Mild JVD present  ?Cardiovascular:  ?   Rate and Rhythm: Normal rate. Rhythm irregular.  ?   Pulses: Normal pulses.  ?   Heart sounds: Murmur heard.  ?Pulmonary:  ?   Effort: Pulmonary effort is normal.  ?   Breath sounds: Normal breath sounds.  ?Abdominal:  ?   General: Bowel sounds are normal. There is no distension.  ?   Palpations: Abdomen is soft. There is no mass.  ?   Tenderness: There is no abdominal tenderness. There is no right CVA tenderness, left CVA tenderness, guarding or rebound.  ?   Hernia: No hernia is present.  ?  Musculoskeletal:     ?   General: Normal range of motion.  ?   Cervical back: Normal range of motion and neck supple.  ?   Comments: Left foot deformity with swelling present and unchanged. ROM and strength of the left foot is diminished but at baseline.  The patient is using a walking cane to help her with mobility and balance.  ?Chronic low back pain making it difficult to walk in erect position. Bending at the waist and stooping to reach for things often increases pain. Leaning heavier on walking cane.  ?  ?Lymphadenopathy:  ?   Cervical: No cervical adenopathy.  ?Skin: ?   General: Skin is warm and dry.  ?   Capillary Refill: Capillary refill takes less than 2 seconds.  ?Neurological:  ?   General: No focal deficit present.  ?   Mental Status: She is alert and oriented to person, place, and time.  ?Psychiatric:     ?   Mood and Affect: Mood normal.     ?   Behavior: Behavior normal.     ?   Thought Content: Thought content normal.     ?   Judgment: Judgment normal.  ?  ? ? Assessment & Plan  ?  ?1. Chronic atrial fibrillation (HCC) ?Stable. Continue eliquis and  blood pressure medications as prescribed. Follow up with cardiology as scheduled.  ? ?2. Essential hypertension ?Stable. Continue coreg and  losartan as prescribed  ? ?3. Carotid artery 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, bilateral ?Well managed. Continue with eliquis as prescribed. Regular visits with vein and vascular provider as scheduled.  ? ?

## 2021-10-29 LAB — COMPREHENSIVE METABOLIC PANEL
ALT: 17 IU/L (ref 0–32)
AST: 35 IU/L (ref 0–40)
Albumin/Globulin Ratio: 1.3 (ref 1.2–2.2)
Albumin: 4 g/dL (ref 3.6–4.6)
Alkaline Phosphatase: 113 IU/L (ref 44–121)
BUN/Creatinine Ratio: 19 (ref 12–28)
BUN: 25 mg/dL (ref 8–27)
Bilirubin Total: 0.5 mg/dL (ref 0.0–1.2)
CO2: 29 mmol/L (ref 20–29)
Calcium: 9.1 mg/dL (ref 8.7–10.3)
Chloride: 94 mmol/L — ABNORMAL LOW (ref 96–106)
Creatinine, Ser: 1.3 mg/dL — ABNORMAL HIGH (ref 0.57–1.00)
Globulin, Total: 3.1 g/dL (ref 1.5–4.5)
Glucose: 66 mg/dL — ABNORMAL LOW (ref 70–99)
Potassium: 3.6 mmol/L (ref 3.5–5.2)
Sodium: 138 mmol/L (ref 134–144)
Total Protein: 7.1 g/dL (ref 6.0–8.5)
eGFR: 41 mL/min/{1.73_m2} — ABNORMAL LOW (ref >59)

## 2021-10-30 NOTE — Progress Notes (Deleted)
Office Visit Note  Patient: Martha Peterson             Date of Birth: 1941/02/09           MRN: [id number]             PCP: Ronnell Freshwater, NP Referring: Ronnell Freshwater, NP Visit Date: 11/13/2021 Occupation: '@GUAROCC'$ @  Subjective:  No chief complaint on file.   History of Present Illness: Martha Peterson is a 81 y.o. female ***   Activities of Daily Living:  Patient reports morning stiffness for *** {minute/hour:19697}.   Patient {ACTIONS;DENIES/REPORTS:21021675::"Denies"} nocturnal pain.  Difficulty dressing/grooming: {ACTIONS;DENIES/REPORTS:21021675::"Denies"} Difficulty climbing stairs: {ACTIONS;DENIES/REPORTS:21021675::"Denies"} Difficulty getting out of chair: {ACTIONS;DENIES/REPORTS:21021675::"Denies"} Difficulty using hands for taps, buttons, cutlery, and/or writing: {ACTIONS;DENIES/REPORTS:21021675::"Denies"}  No Rheumatology ROS completed.   PMFS History:  Patient Active Problem List   Diagnosis Date Noted   Nonspecific abnormal results of kidney function study 10/28/2021   Body mass index (BMI) of 31.0-31.9 in adult 06/30/2021   Acute non-recurrent maxillary sinusitis 05/30/2021   Hyperlipidemia, mixed 03/31/2021   Vitamin deficiency 03/31/2021   Chronic allergic rhinitis 02/01/2021   Adjustment insomnia 12/07/2020   Encounter to establish care 09/26/2020   Chronic atrial fibrillation ([place]) 09/26/2020   Heart failure with reduced ejection fraction ([place]) 09/26/2020   Thrombocytopenia, unspecified ([place]) 09/26/2020   Lymphedema 07/11/2020   Elevated glucose 06/27/2020   Chronic pain syndrome 06/27/2020   Stage 3 chronic kidney disease ([place]) 06/27/2020   Cellulitis of left lower extremity 06/27/2020   Needs flu shot 05/08/2020   Lower extremity edema 04/16/2020   [name] (nonalcoholic steatohepatitis) 04/16/2020   Encounter for general adult medical examination with abnormal findings 02/25/2020   Chronic gout without tophus 02/25/2020   Current  moderate episode of major depressive disorder (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[place]) 07/04/2019   Left ventricular systolic dysfunction 97/67/3419   Diastolic dysfunction 37/90/2409   Degenerative joint disease involving multiple joints 73/53/2992   Nonalcoholic fatty liver disease 02/12/2019   Dysuria 02/12/2019   Encounter for long-term (current) use of medications 01/22/2019   Hypokalemia 04/27/2018   Osteopenia 04/04/2018   Hypothyroidism (acquired) 02/14/2018   Neutropenia ([place]) 02/14/2018   Primary osteoarthritis of left hip 12/06/2017   Essential hypertension 09/29/2017   Postural dizziness 09/29/2017   Carotid artery 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, bilateral 09/29/2017   Acquired left ventricular hypertrophy 09/29/2017   Inflammatory polyarthritis ([place]) 09/29/2017   Need for vaccination for Strep pneumoniae 09/29/2017   H/O ankle fusion 05/02/2016   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   Abnormal LFTs 02/13/2014   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   Primary osteoarthritis of both hands 02/13/2014   H/O: gout 02/13/2014   Rheumatoid factor positive 02/13/2014    Past Medical History:  Diagnosis Date   Arthritis    Asthma    Atrial fibrillation ([place])    per patient's daughter    COPD (chronic obstructive pulmonary disease) ([place])    Diverticulitis    Gout    Hyperlipidemia    Hypertension    Hypothyroidism    MGUS (monoclonal gammopathy of unknown significance)  08/20/2015   Microhematuria    Mild mitral regurgitation    Mild tricuspid  regurgitation    Osteopenia    Pulmonary fibrosis (HCC)    Urinary incontinence     Family History  Problem Relation Age of Onset   Heart Problems Mother    Heart Problems Father    Diabetes Father    Lupus Sister    Thyroid disease Sister    Heart Problems Sister    Hypertension Son    Diabetes Son        borderline    Arthritis Daughter        in the left knee    Diabetes Daughter    Neuropathy Daughter    Depression Daughter    Anxiety disorder Daughter    Past Surgical History:  Procedure Laterality Date   ABDOMINAL HYSTERECTOMY     ANKLE SURGERY Left    APPENDECTOMY     CAROTID PTA/STENT INTERVENTION N/A 07/17/2019   Procedure: CAROTID PTA/STENT INTERVENTION;  Surgeon: Algernon Huxley, MD;  Location: [facility];  Service: Cardiovascular;  Laterality: N/A;   CAROTID-SUBCLAVIAN BYPASS GRAFT N/A 07/26/2019   Procedure: BYPASS GRAFT CAROTID-SUBCLAVIAN ( CAROTID TO CAROTID BYPASS);  Surgeon: Algernon Huxley, MD;  Location: [facility];  Service: Vascular;  Laterality: N/A;   CHOLECYSTECTOMY     COLONOSCOPY WITH PROPOFOL N/A 12/04/2014   Procedure: COLONOSCOPY WITH PROPOFOL;  Surgeon: Lollie Sails, MD;  Location: [facility];  Service: Endoscopy;  Laterality: N/A;   FRACTURE SURGERY     Femur Fx; LT Ankle Pinning   JOINT REPLACEMENT     RT TKR   Patial Thyroid Resection     Social History   Social History Narrative   Lives at home with husband in private residence   Immunization History  Administered Date(s) Administered   Fluad Quad(high Dose 65+) 03/31/2021   Influenza Inj Mdck Quad Pf 03/18/2018, 04/14/2019, 04/16/2020   Influenza-Unspecified 05/04/2017   Moderna Sars-Covid-2 Vaccination 08/09/2019, 09/06/2019, 07/25/2020   Pneumococcal Conjugate-13 03/01/2018   Pneumococcal Polysaccharide-23 05/31/2015   Zoster Recombinat (Shingrix) 01/25/2018, 04/18/2018, 03/31/2019     Objective: Vital Signs: There were no vitals taken for this visit.   Physical  Exam   Musculoskeletal Exam: ***  CDAI Exam: CDAI Score: -- Patient Global: --; Provider Global: -- Swollen: --; Tender: -- Joint Exam 11/13/2021   No joint exam has been documented for this visit   There is currently no information documented on the homunculus. Go to the Rheumatology activity and complete the homunculus joint exam.  Investigation: No additional findings.  Imaging: No results found.  Recent Labs: Lab Results  Component Value Date   WBC 3.8 03/31/2021   HGB 11.9 03/31/2021   PLT 126 (L) 03/31/2021   NA 138 10/28/2021   K 3.6 10/28/2021   CL 94 (L) 10/28/2021   CO2 29 10/28/2021   GLUCOSE 66 (L) 10/28/2021   BUN 25 10/28/2021   CREATININE 1.30 (H) 10/28/2021   BILITOT 0.5 10/28/2021   ALKPHOS 113 10/28/2021   AST 35 10/28/2021   ALT 17 10/28/2021   PROT 7.1 10/28/2021   ALBUMIN 4.0 10/28/2021   CALCIUM 9.1 10/28/2021   GFRAA 50 (L) 07/10/2020    Speciality Comments: No specialty comments available.  Procedures:  No procedures performed Allergies: Latex, Codeine, Levaquin [levofloxacin], and Tape   Assessment / Plan:     Visit Diagnoses: No diagnosis found.  Orders: No orders of the defined types were placed in  this encounter.  No orders of the defined types were placed in this encounter.   Face-to-face time spent with patient was *** minutes. Greater than 50% of time was spent in counseling and coordination of care.  Follow-Up Instructions: No follow-ups on file.   Earnestine Mealing, CMA  Note - This record has been created using Editor, commissioning.  Chart creation errors have been sought, but may not always  have been located. Such creation errors do not reflect on  the standard of medical care.

## 2021-11-13 ENCOUNTER — Ambulatory Visit: Payer: Medicare HMO | Admitting: Rheumatology

## 2021-11-13 DIAGNOSIS — M1612 Unilateral primary osteoarthritis, left hip: Secondary | ICD-10-CM

## 2021-11-13 DIAGNOSIS — M19041 Primary osteoarthritis, right hand: Secondary | ICD-10-CM

## 2021-11-13 DIAGNOSIS — I10 Essential (primary) hypertension: Secondary | ICD-10-CM

## 2021-11-13 DIAGNOSIS — R768 Other specified abnormal immunological findings in serum: Secondary | ICD-10-CM

## 2021-11-13 DIAGNOSIS — Z96651 Presence of right artificial knee joint: Secondary | ICD-10-CM

## 2021-11-13 DIAGNOSIS — Z981 Arthrodesis status: Secondary | ICD-10-CM

## 2021-11-13 DIAGNOSIS — D472 Monoclonal gammopathy: Secondary | ICD-10-CM

## 2021-11-13 DIAGNOSIS — Z8739 Personal history of other diseases of the musculoskeletal system and connective tissue: Secondary | ICD-10-CM

## 2021-11-13 DIAGNOSIS — I517 Cardiomegaly: Secondary | ICD-10-CM

## 2021-11-13 DIAGNOSIS — I6523 Occlusion and stenosis of bilateral carotid arteries: Secondary | ICD-10-CM

## 2021-11-13 DIAGNOSIS — Z8639 Personal history of other endocrine, nutritional and metabolic disease: Secondary | ICD-10-CM

## 2021-11-13 DIAGNOSIS — M545 Low back pain, unspecified: Secondary | ICD-10-CM

## 2021-11-13 DIAGNOSIS — M1712 Unilateral primary osteoarthritis, left knee: Secondary | ICD-10-CM

## 2021-11-13 DIAGNOSIS — Z8709 Personal history of other diseases of the respiratory system: Secondary | ICD-10-CM

## 2021-11-19 NOTE — Progress Notes (Deleted)
Office Visit Note  Patient: Martha Peterson             Date of Birth: Oct 07, 1940           MRN: [id number]             PCP: Ronnell Freshwater, NP Referring: Ronnell Freshwater, NP Visit Date: 12/03/2021 Occupation: '@GUAROCC'$ @  Subjective:    History of Present Illness: Martha Peterson is a 81 y.o. female with history of osteoarthritis.   Activities of Daily Living:  Patient reports morning stiffness for *** {minute/hour:19697}.   Patient {ACTIONS;DENIES/REPORTS:21021675::"Denies"} nocturnal pain.  Difficulty dressing/grooming: {ACTIONS;DENIES/REPORTS:21021675::"Denies"} Difficulty climbing stairs: {ACTIONS;DENIES/REPORTS:21021675::"Denies"} Difficulty getting out of chair: {ACTIONS;DENIES/REPORTS:21021675::"Denies"} Difficulty using hands for taps, buttons, cutlery, and/or writing: {ACTIONS;DENIES/REPORTS:21021675::"Denies"}  No Rheumatology ROS completed.   PMFS History:  Patient Active Problem List   Diagnosis Date Noted  . Nonspecific abnormal results of kidney function study 10/28/2021  . Body mass index (BMI) of 31.0-31.9 in adult 06/30/2021  . Acute non-recurrent maxillary sinusitis 05/30/2021  . Hyperlipidemia, mixed 03/31/2021  . Vitamin deficiency 03/31/2021  . Chronic allergic rhinitis 02/01/2021  . Adjustment insomnia 12/07/2020  . Encounter to establish care 09/26/2020  . Chronic atrial fibrillation ([place]) 09/26/2020  . Heart failure with reduced ejection fraction ([place]) 09/26/2020  . Thrombocytopenia, unspecified ([place]) 09/26/2020  . Lymphedema 07/11/2020  . Elevated glucose 06/27/2020  . Chronic pain syndrome 06/27/2020  . Stage 3 chronic kidney disease ([place]) 06/27/2020  . Cellulitis of left lower extremity 06/27/2020  . Needs flu shot 05/08/2020  . Lower extremity edema 04/16/2020  . [name] (nonalcoholic steatohepatitis) 04/16/2020  . Encounter for general adult medical examination with abnormal findings 02/25/2020  . Chronic gout without tophus  02/25/2020  . Current moderate episode of major depressive disorder ([place]) 02/25/2020  . Tick bite of left hip 12/06/2019  . Encounter for screening mammogram for malignant neoplasm of breast 12/06/2019  . Screening for osteoporosis 12/06/2019  . S/P carotid endarterectomy 09/09/2019  . Iron deficiency anemia 09/09/2019  . Abnormal renal function 09/09/2019  . Hypomagnesemia 09/09/2019  . Hospital discharge follow-up 08/02/2019  . 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 Left ventricular systolic dysfunction 17/49/4496  . Diastolic dysfunction 75/91/6384  . Degenerative joint disease involving multiple joints 05/07/2019  . Nonalcoholic fatty liver disease 02/12/2019  . Dysuria 02/12/2019  . Encounter for long-term (current) use of medications 01/22/2019  . Hypokalemia 04/27/2018  . Osteopenia 04/04/2018  . Hypothyroidism (acquired) 02/14/2018  . Neutropenia ([place]) 02/14/2018  . Primary osteoarthritis of left hip 12/06/2017  . Essential hypertension 09/29/2017  . Postural dizziness 09/29/2017  . 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 Acquired left ventricular hypertrophy 09/29/2017  . Inflammatory polyarthritis ([place]) 09/29/2017  . Need for vaccination for Strep pneumoniae 09/29/2017  . H/O ankle fusion 05/02/2016  . 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 Abnormal LFTs 02/13/2014  . 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 Primary osteoarthritis of both hands 02/13/2014  . H/O: gout 02/13/2014  . Rheumatoid factor positive 02/13/2014    Past Medical History:  Diagnosis Date  . Arthritis   . Asthma   . Atrial fibrillation ([place])    per patient's daughter   . COPD (chronic obstructive pulmonary disease) ([place])   . Diverticulitis   . Gout   . Hyperlipidemia   . Hypertension   . Hypothyroidism   . 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  Microhematuria   . Mild mitral regurgitation   . Mild tricuspid regurgitation   . Osteopenia   . Pulmonary fibrosis ([place])   . Urinary incontinence     Family History  Problem Relation Age of Onset  . Heart Problems Mother   . Heart Problems Father   . Diabetes Father   . Lupus Sister   . Thyroid disease Sister   . Heart Problems Sister   . Hypertension Son   . Diabetes Son        borderline   . Arthritis Daughter        in the left knee   . Diabetes Daughter   . Neuropathy Daughter   . Depression Daughter   . Anxiety disorder Daughter    Past Surgical History:  Procedure Laterality Date  . ABDOMINAL HYSTERECTOMY    . ANKLE SURGERY Left   . APPENDECTOMY    . CAROTID PTA/STENT INTERVENTION N/A 07/17/2019   Procedure: CAROTID PTA/STENT INTERVENTION;  Surgeon: Algernon Huxley, MD;  Location: [facility];  Service: Cardiovascular;  Laterality: N/A;  . CAROTID-SUBCLAVIAN BYPASS GRAFT N/A 07/26/2019   Procedure: BYPASS GRAFT CAROTID-SUBCLAVIAN ( CAROTID TO CAROTID BYPASS);  Surgeon: Algernon Huxley, MD;  Location: [facility];  Service: Vascular;  Laterality: N/A;  . CHOLECYSTECTOMY    . COLONOSCOPY WITH PROPOFOL N/A 12/04/2014   Procedure: COLONOSCOPY WITH PROPOFOL;  Surgeon: Lollie Sails, MD;  Location: [facility];  Service: Endoscopy;  Laterality: N/A;  . FRACTURE SURGERY     Femur Fx; LT Ankle Pinning  . JOINT REPLACEMENT     RT TKR  . Patial Thyroid Resection     Social History   Social History Narrative   Lives at home with husband in private residence   Immunization History  Administered Date(s) Administered  . Fluad Quad(high Dose 65+) 03/31/2021  . Influenza Inj Mdck Quad Pf 03/18/2018, 04/14/2019, 04/16/2020  . Influenza-Unspecified 05/04/2017  . Moderna Sars-Covid-2 Vaccination 08/09/2019, 09/06/2019, 07/25/2020  . Pneumococcal Conjugate-13 03/01/2018  . Pneumococcal Polysaccharide-23 05/31/2015  . Zoster Recombinat (Shingrix) 01/25/2018, 04/18/2018,  03/31/2019     Objective: Vital Signs: There were no vitals taken for this visit.   Physical Exam Vitals and nursing note reviewed.  Constitutional:      Appearance: She is well-developed.  HENT:     Head: Normocephalic and atraumatic.  Eyes:     Conjunctiva/sclera: Conjunctivae normal.  Cardiovascular:     Rate and Rhythm: Normal rate and regular rhythm.     Heart sounds: Normal heart sounds.  Pulmonary:     Effort: Pulmonary effort is normal.     Breath sounds: Normal breath sounds.  Abdominal:     General: Bowel sounds are normal.     Palpations: Abdomen is soft.  Musculoskeletal:     Cervical back: Normal range of motion.  Skin:    General: Skin is warm and dry.     Capillary Refill: Capillary refill takes less than 2 seconds.  Neurological:     Mental Status: She is alert and oriented to person, place, and time.  Psychiatric:        Behavior: Behavior normal.     Musculoskeletal Exam: ***  CDAI Exam: CDAI Score: -- Patient Global: --; Provider Global: -- Swollen: --; Tender: -- Joint Exam 12/03/2021   No joint exam has been documented for this visit   There is currently no information documented on the homunculus. Go to the Rheumatology activity and complete the homunculus joint exam.  Investigation: No additional findings.  Imaging: No results found.  Recent Labs: Lab Results  Component Value Date   WBC 3.8 03/31/2021   HGB 11.9 03/31/2021   PLT 126 (L) 03/31/2021   NA 138 10/28/2021   K 3.6 10/28/2021   CL 94 (L) 10/28/2021   CO2 29 10/28/2021   GLUCOSE 66 (L) 10/28/2021   BUN 25 10/28/2021   CREATININE 1.30 (H) 10/28/2021   BILITOT 0.5 10/28/2021   ALKPHOS 113 10/28/2021   AST 35 10/28/2021   ALT 17 10/28/2021   PROT 7.1 10/28/2021   ALBUMIN 4.0 10/28/2021   CALCIUM 9.1 10/28/2021   GFRAA 50 (L) 07/10/2020    Speciality Comments: No specialty comments available.  Procedures:  No procedures performed Allergies: Latex, Codeine,  Levaquin [levofloxacin], and Tape   Assessment / Plan:     Visit Diagnoses: Primary osteoarthritis of both hands  Rheumatoid factor positive  Primary osteoarthritis of left hip  Status post total knee replacement, right  Primary osteoarthritis of left knee  History of ankle fusion  History of gout  History of osteopenia  Essential hypertension  Carotid artery 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, bilateral  Left ventricular hypertrophy  Double M peak gammopathy  History of COPD  History of hypothyroidism  History of pulmonary fibrosis  Orders: No orders of the defined types were placed in this encounter.  No orders of the defined types were placed in this encounter.   Face-to-face time spent with patient was *** minutes. Greater than 50% of time was spent in counseling and coordination of care.  Follow-Up Instructions: No follow-ups on file.   Ofilia Neas, PA-C  Note - This record has been created using Dragon software.  Chart creation errors have been sought, but may not always  have been located. Such creation errors do not reflect on  the standard of medical care.

## 2021-11-23 ENCOUNTER — Other Ambulatory Visit: Payer: Self-pay | Admitting: Nurse Practitioner

## 2021-11-23 DIAGNOSIS — K7581 Nonalcoholic steatohepatitis (NASH): Secondary | ICD-10-CM

## 2021-11-23 DIAGNOSIS — R944 Abnormal results of kidney function studies: Secondary | ICD-10-CM

## 2021-11-23 NOTE — Progress Notes (Signed)
Renal functions are stable, even improved from recent checks. I have ordered abdominal ultrasound for surveillance of kidneys and liver. Should be done at outpatient imaging on [address].

## 2021-11-23 NOTE — Progress Notes (Signed)
Please let the patient know that her renal functions are stable, even improved from recent checks. I have ordered abdominal ultrasound for surveillance of kidneys and liver. Should be done at outpatient imaging on [address]. They should be calling to schedule this in near future.  Thanks so much.   -HB

## 2021-12-03 ENCOUNTER — Ambulatory Visit: Payer: Medicare HMO | Admitting: Physician Assistant

## 2021-12-03 DIAGNOSIS — I6523 Occlusion and stenosis of bilateral carotid arteries: Secondary | ICD-10-CM

## 2021-12-03 DIAGNOSIS — Z981 Arthrodesis status: Secondary | ICD-10-CM

## 2021-12-03 DIAGNOSIS — I517 Cardiomegaly: Secondary | ICD-10-CM

## 2021-12-03 DIAGNOSIS — Z8709 Personal history of other diseases of the respiratory system: Secondary | ICD-10-CM

## 2021-12-03 DIAGNOSIS — R768 Other specified abnormal immunological findings in serum: Secondary | ICD-10-CM

## 2021-12-03 DIAGNOSIS — Z8739 Personal history of other diseases of the musculoskeletal system and connective tissue: Secondary | ICD-10-CM

## 2021-12-03 DIAGNOSIS — Z8639 Personal history of other endocrine, nutritional and metabolic disease: Secondary | ICD-10-CM

## 2021-12-03 DIAGNOSIS — D472 Monoclonal gammopathy: Secondary | ICD-10-CM

## 2021-12-03 DIAGNOSIS — M19041 Primary osteoarthritis, right hand: Secondary | ICD-10-CM

## 2021-12-03 DIAGNOSIS — M1712 Unilateral primary osteoarthritis, left knee: Secondary | ICD-10-CM

## 2021-12-03 DIAGNOSIS — Z96651 Presence of right artificial knee joint: Secondary | ICD-10-CM

## 2021-12-03 DIAGNOSIS — M1612 Unilateral primary osteoarthritis, left hip: Secondary | ICD-10-CM

## 2021-12-03 DIAGNOSIS — I10 Essential (primary) hypertension: Secondary | ICD-10-CM

## 2021-12-30 ENCOUNTER — Encounter: Payer: Self-pay | Admitting: Nurse Practitioner

## 2021-12-30 ENCOUNTER — Ambulatory Visit (INDEPENDENT_AMBULATORY_CARE_PROVIDER_SITE_OTHER): Payer: Medicare HMO | Admitting: Nurse Practitioner

## 2021-12-30 VITALS — BP 112/62 | HR 82 | Ht 62.99 in | Wt 177.0 lb

## 2021-12-30 DIAGNOSIS — I6523 Occlusion and stenosis of bilateral carotid arteries: Secondary | ICD-10-CM

## 2021-12-30 DIAGNOSIS — K7581 Nonalcoholic steatohepatitis (NASH): Secondary | ICD-10-CM | POA: Diagnosis not present

## 2021-12-30 DIAGNOSIS — M064 Inflammatory polyarthropathy: Secondary | ICD-10-CM | POA: Diagnosis not present

## 2021-12-30 DIAGNOSIS — M158 Other polyosteoarthritis: Secondary | ICD-10-CM

## 2021-12-30 DIAGNOSIS — K760 Fatty (change of) liver, not elsewhere classified: Secondary | ICD-10-CM

## 2021-12-30 DIAGNOSIS — Z1231 Encounter for screening mammogram for malignant neoplasm of breast: Secondary | ICD-10-CM

## 2021-12-30 DIAGNOSIS — N183 Chronic kidney disease, stage 3 unspecified: Secondary | ICD-10-CM | POA: Diagnosis not present

## 2021-12-30 DIAGNOSIS — I482 Chronic atrial fibrillation, unspecified: Secondary | ICD-10-CM | POA: Diagnosis not present

## 2021-12-30 MED ORDER — OXYCODONE ER 18 MG PO C12A: 1.0000 | EXTENDED_RELEASE_CAPSULE | Freq: Two times a day (BID) | ORAL | 0 refills | Status: DC

## 2021-12-30 MED ORDER — OXYCODONE HCL 10 MG PO TABS: 10.0000 mg | ORAL_TABLET | Freq: Every day | ORAL | 0 refills | Status: DC | PRN

## 2021-12-30 NOTE — Progress Notes (Signed)
Established patient visit   Patient: Martha Peterson   DOB: [birth year]   81 y.o. Female  MRN: [id number] Visit Date: 12/30/2021   Chief Complaint  Patient presents with   Hypertension   Medication Refill   Subjective    HPI  [name] presents for routine follow up.  Doing well with current prescriptions for pain management.  She is now taking oxycodone ER 18 mg twice daily.  Oxycodone 10 mg daily if needed for breakthrough pain.  Indication for chronic opioid: chronic pain and inflammatory polyarthritis. Patient unable to take NSAIDs due to treatment with blood thinners. 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  Medication and dose: Oxycodone XR 18 mg twice daily and oxycodone '10mg'$  QD prn for breakthrough pain  # pills per month: 60 oxycodone XR and 30 oxycodone '10mg'$  Last UDS date: 03/31/2021 Opioid Treatment Agreement signed (Y/N): yes Opioid Treatment Agreement last reviewed with patient:  03/31/2021 NCCSRS reviewed this encounter (include red flags): no  PDMP profile reviewed today 12/30/2021  -overdose risk score is 100 -last fill of Oxycodone ER '18mg'$  was 11/28/2021 -last fill Oxycodone '10mg'$  11/28/2021    Medications: Outpatient Medications Prior to Visit  Medication Sig   allopurinol (ZYLOPRIM) 100 MG tablet TAKE 2 TABLETS EVERY DAY   apixaban (ELIQUIS) 5 MG TABS tablet Take 1 tablet (5 mg total) by mouth 2 (two) times daily.   ascorbic acid (VITAMIN C) 500 MG tablet Take 500 mg by mouth daily.   aspirin EC 81 MG tablet Take 81 mg by mouth daily.   atorvastatin (LIPITOR) 10 MG tablet TAKE 1 TABLET EVERY DAY AT 6PM   carvedilol (COREG) 6.25 MG tablet TAKE 1 TABLET TWICE DAILY WITH MEALS   cholecalciferol (VITAMIN D) 400 UNITS TABS tablet Take 1,000 Units by mouth.   diclofenac sodium (VOLTAREN) 1 % GEL Apply 3 grams to 3 large joints, up to 3 times daily as needed.   fluticasone (FLONASE) 50 MCG/ACT nasal spray Place 2  sprays into both nostrils daily. (Patient taking differently: Place 2 sprays into both nostrils as needed.)   Ginger, Zingiber officinalis, (GINGER PO) Take by mouth.   IBU 600 MG tablet TAKE 1 TABLET EVERY 8 HOURS AS NEEDED   levocetirizine (XYZAL) 5 MG tablet TAKE 1 TABLET EVERY EVENING   levothyroxine (SYNTHROID) 137 MCG tablet TAKE 1 TABLET EVERY DAY BEFORE BREAKFAST   losartan-hydrochlorothiazide (HYZAAR) 100-25 MG tablet TAKE 1 TABLET EVERY DAY   magnesium oxide (MAG-OX) 400 MG tablet Take 400 mg by mouth daily.    mirtazapine (REMERON) 7.5 MG tablet TAKE 1 TO 2 TABLETS EVERY EVENING   Misc Natural Products (TART CHERRY ADVANCED PO) Take by mouth daily.   Multiple Vitamins-Minerals (PRESERVISION AREDS 2 PO) Take by mouth 2 (two) times daily.   omega-3 acid ethyl esters (LOVAZA) 1 G capsule Take 1 g by mouth 2 (two) times daily.   potassium chloride (KLOR-CON) 10 MEQ tablet TAKE 1 TABLET EVERY DAY   predniSONE (DELTASONE) 5 MG tablet TAKE 2 TABLETS EVERY DAY WITH BREAKFAST   tiZANidine (ZANAFLEX) 4 MG tablet TAKE 1 TABLET TWICE DAILY   torsemide (DEMADEX) 20 MG tablet TAKE 1 TABLET EVERY DAY (DISCONTINUE FUROSEMIDE)   traZODone (DESYREL) 50 MG tablet Take 0.5-1 tablets (25-50 mg total) by mouth at bedtime as needed for sleep.   TURMERIC PO Take by mouth daily.   vitamin E 400 UNIT capsule Take 800 Units by mouth daily.    [DISCONTINUED]  oxyCODONE ER 18 MG C12A Take 1 tablet by mouth 2 (two) times daily.   [DISCONTINUED] Oxycodone HCl 10 MG TABS Take 1 tablet (10 mg total) by mouth daily as needed.   No facility-administered medications prior to visit.    Review of Systems  Constitutional:  Negative for activity change, appetite change, chills, fatigue and fever.  HENT:  Negative for congestion, postnasal drip, rhinorrhea, sinus pressure, sinus pain, sneezing and sore throat.   Eyes: Negative.   Respiratory:  Negative for cough, chest tightness, shortness of breath and wheezing.    Cardiovascular:  Positive for leg swelling. Negative for chest pain and palpitations.       Chronic a-fib  Gastrointestinal:  Negative for abdominal pain, constipation, diarrhea, nausea and vomiting.  Endocrine: Negative for cold intolerance, heat intolerance, polydipsia and polyuria.       Well managed hypothyroid   Genitourinary:  Negative for dyspareunia, dysuria, flank pain, frequency and urgency.  Musculoskeletal:  Positive for gait problem. Negative for arthralgias, back pain and myalgias.       Back pain and generalized joint pain well managed with current medication. She has tolerated this well and has no negative side effects.     Skin:  Negative for rash.  Allergic/Immunologic: Negative for environmental allergies.  Neurological:  Negative for dizziness, weakness and headaches.  Hematological:  Negative for adenopathy.  Psychiatric/Behavioral:  The patient is not nervous/anxious.       Objective     Today's Vitals   12/30/21 1556  BP: 112/62  Pulse: 82  SpO2: 97%  Weight: 177 lb (80.3 kg)  Height: 5' 2.99" (1.6 m)   Body mass index is 31.36 kg/m.   BP Readings from Last 3 Encounters:  12/30/21 112/62  10/28/21 104/63  08/28/21 115/69    Wt Readings from Last 3 Encounters:  12/30/21 177 lb (80.3 kg)  10/28/21 180 lb 1.9 oz (81.7 kg)  08/28/21 176 lb 12.8 oz (80.2 kg)    Physical Exam Vitals and nursing note reviewed.  Constitutional:      Appearance: Normal appearance. She is well-developed.  HENT:     Head: Normocephalic and atraumatic.     Nose: Nose normal.     Mouth/Throat:     Mouth: Mucous membranes are moist.     Pharynx: Oropharynx is clear.  Eyes:     Extraocular Movements: Extraocular movements intact.     Conjunctiva/sclera: Conjunctivae normal.     Pupils: Pupils are equal, round, and reactive to light.  Neck:     Comments: Mild JVD present  Cardiovascular:     Rate and Rhythm: Normal rate. Rhythm irregular.     Pulses: Normal pulses.      Heart sounds: Murmur heard.     Comments: Irregular heart rhythm.  Pulmonary:     Effort: Pulmonary effort is normal.     Breath sounds: Normal breath sounds.  Abdominal:     General: Bowel sounds are normal. There is no distension.     Palpations: Abdomen is soft. There is no mass.     Tenderness: There is no abdominal tenderness. There is no right CVA tenderness, left CVA tenderness, guarding or rebound.     Hernia: No hernia is present.  Musculoskeletal:        General: Normal range of motion.     Cervical back: Normal range of motion and neck supple.     Comments: Left foot deformity with swelling present and unchanged. ROM and strength of the  left foot is diminished but at baseline.  The patient is using a walking cane to help her with mobility and balance.  Chronic low back pain making it difficult to walk in erect position. Bending at the waist and stooping to reach for things often increases pain. Leaning heavier on walking cane.    Lymphadenopathy:     Cervical: No cervical adenopathy.  Skin:    General: Skin is warm and dry.     Capillary Refill: Capillary refill takes less than 2 seconds.  Neurological:     General: No focal deficit present.     Mental Status: She is alert and oriented to person, place, and time.  Psychiatric:        Mood and Affect: Mood normal.        Behavior: Behavior normal.        Thought Content: Thought content normal.        Judgment: Judgment normal.      Assessment & Plan    1. Inflammatory polyarthritis (HCC) Generalized pain well controlled.  May continue oxycodone ER 18 mg twice daily.  Oxycodone 10 mg IR may be taken daily as needed. Reviewed risk factors associated with long term treatment with narcotic pain medications.  She does voice understanding.  She takes medications only as prescribed. She does not drive. Two 30 day prescriptions of both medications were sent to her pharmacy. Dates are 12/30/2021 and 01/28/2022.  - oxyCODONE ER  18 MG C12A; Take 1 tablet by mouth 2 (two) times daily.  Dispense: 60 capsule; Refill: 0  2. Other osteoarthritis involving multiple joints Generalized pain well controlled.  May continue oxycodone ER 18 mg twice daily.  Oxycodone 10 mg IR may be taken daily as needed. Reviewed risk factors associated with long term treatment with narcotic pain medications.  She does voice understanding.  She takes medications only as prescribed. She does not drive. Two 30 day prescriptions of both medications were sent to her pharmacy. Dates are 12/30/2021 and 01/28/2022.  - Oxycodone HCl 10 MG TABS; Take 1 tablet (10 mg total) by mouth daily as needed.  Dispense: 30 tablet; Refill: 0  3. Stage 3 chronic kidney disease, unspecified whether stage 3a or 3b CKD ([place]) Most recent renal function test have improved.  Abdominal ultrasound ordered for surveillance. - US Abdomen Complete; Future  4. [name] (nonalcoholic steatohepatitis) Most recent liver functions normal.  Abdominal ultrasound ordered for surveillance. - US Abdomen Complete; Future  5. Carotid artery 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, bilateral Patient continues to do well after carotid endarterectomy.  She is followed by vascular routinely.  6. Chronic atrial fibrillation (HCC) Stable.  Continue routine follow-up visits with cardiology.  7. Encounter for screening mammogram for malignant neoplasm of breast Screening mammogram ordered today.  Patient understands she needs to call and schedule appointment. - MM DIGITAL SCREENING BILATERAL; Future   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, bilateral   Chronic atrial fibrillation (HCC)     Digestive   [name] (nonalcoholic steatohepatitis)   Relevant Orders   US Abdomen Complete     Musculoskeletal and Integument   Inflammatory polyarthritis (HCC) - Primary   Relevant Medications   oxyCODONE ER 18 MG  C12A   Oxycodone HCl 10 MG TABS   Degenerative joint disease involving multiple joints   Relevant Medications   oxyCODONE ER 18 MG C12A   Oxycodone HCl 10  MG TABS     Genitourinary   Stage 3 chronic kidney disease ([place])   Relevant Orders   US Abdomen Complete     Other   Encounter for screening mammogram for malignant neoplasm of breast   Relevant Orders   MM DIGITAL SCREENING BILATERAL     Return in about 2 months (around 03/02/2022) for blood pressure, med refills.         Ronnell Freshwater, NP  [facility] [phone] (phone) [phone] (fax)  [place]

## 2022-01-14 ENCOUNTER — Other Ambulatory Visit: Payer: Self-pay | Admitting: Nurse Practitioner

## 2022-01-14 DIAGNOSIS — R600 Localized edema: Secondary | ICD-10-CM

## 2022-01-22 DIAGNOSIS — H353211 Exudative age-related macular degeneration, right eye, with active choroidal neovascularization: Secondary | ICD-10-CM | POA: Diagnosis not present

## 2022-01-22 DIAGNOSIS — Z0100 Encounter for examination of eyes and vision without abnormal findings: Secondary | ICD-10-CM | POA: Diagnosis not present

## 2022-01-23 ENCOUNTER — Ambulatory Visit: Payer: Medicare HMO

## 2022-01-26 ENCOUNTER — Other Ambulatory Visit: Payer: Self-pay | Admitting: Nurse Practitioner

## 2022-01-26 DIAGNOSIS — M064 Inflammatory polyarthropathy: Secondary | ICD-10-CM

## 2022-01-26 DIAGNOSIS — M1612 Unilateral primary osteoarthritis, left hip: Secondary | ICD-10-CM

## 2022-01-29 DIAGNOSIS — H353211 Exudative age-related macular degeneration, right eye, with active choroidal neovascularization: Secondary | ICD-10-CM | POA: Diagnosis not present

## 2022-01-31 ENCOUNTER — Other Ambulatory Visit: Payer: Self-pay | Admitting: Physician Assistant

## 2022-01-31 DIAGNOSIS — J309 Allergic rhinitis, unspecified: Secondary | ICD-10-CM

## 2022-01-31 DIAGNOSIS — F321 Major depressive disorder, single episode, moderate: Secondary | ICD-10-CM

## 2022-01-31 DIAGNOSIS — J0100 Acute maxillary sinusitis, unspecified: Secondary | ICD-10-CM

## 2022-02-04 ENCOUNTER — Ambulatory Visit
Admission: RE | Admit: 2022-02-04 | Discharge: 2022-02-04 | Disposition: A | Discharge status: Home / Self Care | Payer: Medicare HMO | Source: Ambulatory Visit | Attending: Nurse Practitioner | Admitting: Nurse Practitioner

## 2022-02-04 DIAGNOSIS — K7581 Nonalcoholic steatohepatitis (NASH): Secondary | ICD-10-CM | POA: Diagnosis not present

## 2022-02-04 DIAGNOSIS — N281 Cyst of kidney, acquired: Secondary | ICD-10-CM | POA: Diagnosis not present

## 2022-02-04 DIAGNOSIS — N183 Chronic kidney disease, stage 3 unspecified: Secondary | ICD-10-CM | POA: Insufficient documentation

## 2022-02-09 ENCOUNTER — Other Ambulatory Visit: Payer: Self-pay

## 2022-02-09 MED ORDER — APIXABAN 5 MG PO TABS: 5.0000 mg | ORAL_TABLET | Freq: Two times a day (BID) | ORAL | 5 refills | Status: DC

## 2022-02-09 NOTE — Telephone Encounter (Signed)
Prescription refill request for Eliquis received. Indication:Afib Last office visit:9/22 Scr:1.3 Age: 81 Weight:80.3 kg  Prescription refilled

## 2022-02-18 ENCOUNTER — Other Ambulatory Visit: Payer: Self-pay | Admitting: Nurse Practitioner

## 2022-02-18 DIAGNOSIS — M791 Myalgia, unspecified site: Secondary | ICD-10-CM

## 2022-02-27 ENCOUNTER — Ambulatory Visit: Payer: Medicare HMO | Attending: Cardiology | Admitting: Cardiology

## 2022-02-27 ENCOUNTER — Encounter: Payer: Self-pay | Admitting: Cardiology

## 2022-02-27 VITALS — BP 132/70 | HR 86 | Ht 62.0 in | Wt 177.0 lb

## 2022-02-27 DIAGNOSIS — E876 Hypokalemia: Secondary | ICD-10-CM

## 2022-02-27 DIAGNOSIS — I4891 Unspecified atrial fibrillation: Secondary | ICD-10-CM | POA: Diagnosis not present

## 2022-02-27 DIAGNOSIS — I5189 Other ill-defined heart diseases: Secondary | ICD-10-CM | POA: Diagnosis not present

## 2022-02-27 DIAGNOSIS — I10 Essential (primary) hypertension: Secondary | ICD-10-CM

## 2022-02-27 DIAGNOSIS — R600 Localized edema: Secondary | ICD-10-CM

## 2022-02-27 MED ORDER — POTASSIUM CHLORIDE ER 20 MEQ PO TBCR: 20.0000 meq | EXTENDED_RELEASE_TABLET | Freq: Every day | ORAL | 1 refills | Status: DC

## 2022-02-27 MED ORDER — TORSEMIDE 40 MG PO TABS: 40.0000 mg | ORAL_TABLET | Freq: Every day | ORAL | 1 refills | Status: DC

## 2022-02-27 NOTE — Patient Instructions (Addendum)
Medication Instructions:   Your physician has recommended you make the following change in your medication:    INCREASE Torsemide to 40 MG once a day.  2.    INCREASE Potassium to 20 MEQ once a day.  *If you need a refill on your cardiac medications before your next appointment, please call your pharmacy*   Lab Work:  Get a [facility] Lab draw in 1-2 weeks. A [facility] order will be provided to you as requested.    Follow-Up: At [facility], you and your health needs are our priority.  As part of our continuing mission to provide you with exceptional heart care, we have created designated Provider Care Teams.  These Care Teams include your primary Cardiologist (physician) and Advanced Practice Providers (APPs -  Physician Assistants and Nurse Practitioners) who all work together to provide you with the care you need, when you need it.  We recommend signing up for the patient portal called "MyChart".  Sign up information is provided on this After Visit Summary.  MyChart is used to connect with patients for Virtual Visits (Telemedicine).  Patients are able to view lab/test results, encounter notes, upcoming appointments, etc.  Non-urgent messages can be sent to your provider as well.   To learn more about what you can do with MyChart, go to [email or web address].    Your next appointment:   6-8 week(s)  The format for your next appointment:   In Person  Provider:    ONLY WITH Kate Sable, MD    Other Instructions   Important Information About Sugar

## 2022-02-27 NOTE — Progress Notes (Signed)
Cardiology Office Note:    Date:  02/27/2022   ID:  EVOLETT SOMARRIBA, DOB 11/05/1940, MRN [id number]  PCP:  Ronnell Freshwater, NP  Cardiologist:  None  Electrophysiologist:  None   Referring MD: Ronnell Freshwater, NP   Chief Complaint  Patient presents with   Otrher   OTher    12 month follow up -- Meds reviewed verbally with patient.    History of Present Illness:    JEANIFER HALLIDAY is a 81 y.o. female with a hx of permanent atrial fibrillation, HFpEF, hypertension, hyperlipidemia, carotid stenosis, innominate artery stenosis who presents for follow-up.  Being seen for A-fib, leg edema.  Edema deemed secondary to HFpEF.  Managed with compression stockings, torsemide 20 mg daily.  Has noticed swelling over the past several months, usually takes an extra torsemide.  Denies palpitations, takes Eliquis for stroke prophylaxis, denies any bleeding issues.   Prior notes Echo 09/2019 EF 60 to 48%, grade 2 diastolic dysfunction, severely dilated left atrium Patient has a history of mitral regurgitation being followed periodically with echocardiogram.  She has a known history of carotid stenosis with carotid bruit.CT angio of the neck showed high-grade calcific stenosis innominate artery, flow-limiting stenosis in the right carotid system.  There is sclerosis with calcification in the carotid bifurcation bilaterally without significant stenosis.  She is seeing vascular surgery for carotid artery disease.  She takes a baby aspirin and Lipitor 10 mg.  Has a history of liver abnormality hence the low-dose statin.     Past Medical History:  Diagnosis Date   Arthritis    Asthma    Atrial fibrillation ([place])    per patient's daughter    COPD (chronic obstructive pulmonary disease) ([place])    Diverticulitis    Gout    Hyperlipidemia    Hypertension    Hypothyroidism    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   Microhematuria    Mild mitral regurgitation    Mild  tricuspid regurgitation    Osteopenia    Pulmonary fibrosis (HCC)    Urinary incontinence     Past Surgical History:  Procedure Laterality Date   ABDOMINAL HYSTERECTOMY     ANKLE SURGERY Left    APPENDECTOMY     CAROTID PTA/STENT INTERVENTION N/A 07/17/2019   Procedure: CAROTID PTA/STENT INTERVENTION;  Surgeon: Algernon Huxley, MD;  Location: [place] [facility];  Service: Cardiovascular;  Laterality: N/A;   CAROTID-SUBCLAVIAN BYPASS GRAFT N/A 07/26/2019   Procedure: BYPASS GRAFT CAROTID-SUBCLAVIAN ( CAROTID TO CAROTID BYPASS);  Surgeon: Algernon Huxley, MD;  Location: [facility] OR[facility];  Service: Vascular;  Laterality: N/A;   CHOLECYSTECTOMY     COLONOSCOPY WITH PROPOFOL N/A 12/04/2014   Procedure: COLONOSCOPY WITH PROPOFOL;  Surgeon: Lollie Sails, MD;  [facility];  Service: Endoscopy;  Laterality: N/A;   FRACTURE SURGERY     Femur Fx; LT Ankle Pinning   JOINT REPLACEMENT     RT TKR   Patial Thyroid Resection      Current Medications: Current Meds  Medication Sig   allopurinol (ZYLOPRIM) 100 MG tablet TAKE 2 TABLETS EVERY DAY   apixaban (ELIQUIS) 5 MG TABS tablet Take 1 tablet (5 mg total) by mouth 2 (two) times daily.   ascorbic acid (VITAMIN C) 500 MG tablet Take 500 mg by mouth daily.   aspirin EC 81 MG tablet Take 81 mg by mouth daily.   atorvastatin (LIPITOR) 10 MG tablet TAKE 1 TABLET EVERY DAY AT [facility]  carvedilol (COREG) 6.25 MG tablet TAKE 1 TABLET TWICE DAILY WITH MEALS   cholecalciferol (VITAMIN D) 400 UNITS TABS tablet Take 1,000 Units by mouth.   diclofenac sodium (VOLTAREN) 1 % GEL Apply 3 grams to 3 large joints, up to 3 times daily as needed.   fluticasone (FLONASE) 50 MCG/ACT nasal spray Place 2 sprays into both nostrils daily.   Ginger, Zingiber officinalis, (GINGER PO) Take by mouth.   IBU 600 MG tablet TAKE 1 TABLET EVERY 8 HOURS AS NEEDED   levocetirizine (XYZAL) 5 MG tablet TAKE 1 TABLET EVERY EVENING   levothyroxine (SYNTHROID) 137 MCG tablet TAKE 1  TABLET EVERY DAY BEFORE BREAKFAST   losartan-hydrochlorothiazide (HYZAAR) 100-25 MG tablet TAKE 1 TABLET EVERY DAY   magnesium oxide (MAG-OX) 400 MG tablet Take 400 mg by mouth daily.    mirtazapine (REMERON) 7.5 MG tablet TAKE 1 TO 2 TABLETS EVERY EVENING   Misc Natural Products (TART CHERRY ADVANCED PO) Take by mouth daily.   Multiple Vitamins-Minerals (PRESERVISION AREDS 2 PO) Take by mouth 2 (two) times daily.   omega-3 acid ethyl esters (LOVAZA) 1 G capsule Take 1 g by mouth 2 (two) times daily.   oxyCODONE ER 18 MG C12A Take 1 tablet by mouth 2 (two) times daily.   Oxycodone HCl 10 MG TABS Take 1 tablet (10 mg total) by mouth daily as needed.   predniSONE (DELTASONE) 5 MG tablet TAKE 2 TABLETS EVERY DAY WITH BREAKFAST   tiZANidine (ZANAFLEX) 4 MG tablet TAKE 1 TABLET TWICE DAILY   traZODone (DESYREL) 50 MG tablet Take 0.5-1 tablets (25-50 mg total) by mouth at bedtime as needed for sleep.   TURMERIC PO Take by mouth daily.   vitamin E 400 UNIT capsule Take 800 Units by mouth daily.    [DISCONTINUED] potassium chloride (KLOR-CON) 10 MEQ tablet TAKE 1 TABLET EVERY DAY   [DISCONTINUED] torsemide (DEMADEX) 20 MG tablet TAKE 1 TABLET EVERY DAY (DISCONTINUE FUROSEMIDE)     Allergies:   Latex, Codeine, Levaquin [levofloxacin], and Tape   Social History   Socioeconomic History   Marital status: Widowed    Spouse name: Jeneen Rinks   Number of children: Not on file   Years of education: Not on file   Highest education level: Not on file  Occupational History   Not on file  Tobacco Use   Smoking status: Former    Years: 1.00    Types: Cigarettes   Smokeless tobacco: Never  Vaping Use   Vaping Use: Never used  Substance and Sexual Activity   Alcohol use: No   Drug use: No   Sexual activity: Not Currently  Other Topics Concern   Not on file  Social History Narrative   Lives at home with husband in private residence   Social Determinants of Health   Financial Resource Strain: Not on  file  Food Insecurity: Not on file  Transportation Needs: Not on file  Physical Activity: Not on file  Stress: Not on file  Social Connections: Not on file     Family History: The patient's family history includes Anxiety disorder in her daughter; Arthritis in her daughter; Depression in her daughter; Diabetes in her daughter, father, and son; Heart Problems in her father, mother, and sister; Hypertension in her son; Lupus in her sister; Neuropathy in her daughter; Thyroid disease in her sister.  ROS:   Please see the history of present illness.     All other systems reviewed and are negative.  EKGs/Labs/Other Studies Reviewed:  EKG:  EKG is  ordered today.  EKG shows atrial fibrillation, heart rate 86  Recent Labs: 03/31/2021: Hemoglobin 11.9; Platelets 126; TSH 1.600 10/28/2021: ALT 17; BUN 25; Creatinine, Ser 1.30; Potassium 3.6; Sodium 138  Recent Lipid Panel    Component Value Date/Time   CHOL 101 03/31/2021 1109   TRIG 104 03/31/2021 1109   HDL 41 03/31/2021 1109   CHOLHDL 2.5 03/31/2021 1109   LDLCALC 40 03/31/2021 1109    Physical Exam:    VS:  BP 132/70 (BP Location: Left Arm, Patient Position: Sitting, Cuff Size: Normal)   Pulse 86   Ht '5\' 2"'$  (1.575 m)   Wt 177 lb (80.3 kg)   SpO2 97%   BMI 32.37 kg/m     Wt Readings from Last 3 Encounters:  02/27/22 177 lb (80.3 kg)  12/30/21 177 lb (80.3 kg)  10/28/21 180 lb 1.9 oz (81.7 kg)     GEN:  Well nourished, well developed in no acute distress HEENT: Normal NECK: No JVD; bilateral carotid bruits noted LYMPHATICS: No lymphadenopathy CARDIAC: irregular irregular, 2/6 systolic murmur noted, RESPIRATORY:  Clear to auscultation without rales, wheezing or rhonchi  ABDOMEN: Soft, non-tender, non-distended MUSCULOSKELETAL:2+ edema; varicose veins noted SKIN: Warm and dry NEUROLOGIC:  Alert and oriented x 3 PSYCHIATRIC:  Normal affect   ASSESSMENT:    1. Atrial fibrillation, unspecified type ([place])   2.  Grade II diastolic dysfunction   3. Primary hypertension   4. Lower extremity edema   5. Hypokalemia    PLAN:    In order of problems listed above:  Persistent atrial fibrillation, CHA2DS2-VASc of 5 (age, htn, vasc, gender).  Heart rate controlled.  Cont coreg 6.25 mg twice daily, Eliquis 5 mg twice daily.   Grade 2 diastolic dysfunction, leg edema, increase torsemide to 40 mg daily, check BMP in 5 days. Hypertension, BP controlled. Continue Coreg, Hyzaar.  Follow-up in 6 to 8 weeks  This note was generated in part or whole with voice recognition software. Voice recognition is usually quite accurate but there are transcription errors that can and very often do occur. I apologize for any typographical errors that were not detected and corrected.  Medication Adjustments/Labs and Tests Ordered: Current medicines are reviewed at length with the patient today.  Concerns regarding medicines are outlined above.  Orders Placed This Encounter  Procedures   Basic metabolic panel   EKG 63-SLHT    Meds ordered this encounter  Medications   torsemide 40 MG TABS    Sig: Take 40 mg by mouth daily.    Dispense:  90 tablet    Refill:  1   potassium chloride 20 MEQ TBCR    Sig: Take 20 mEq by mouth daily.    Dispense:  90 tablet    Refill:  1     Patient Instructions  Medication Instructions:   Your physician has recommended you make the following change in your medication:    INCREASE Torsemide to 40 MG once a day.  2.    INCREASE Potassium to 20 MEQ once a day.  *If you need a refill on your cardiac medications before your next appointment, please call your pharmacy*   Lab Work:  Get a BMP Lab draw in 1-2 weeks. A [facility] order will be provided to you as requested.    Follow-Up: At [facility], you and your health needs are our priority.  As part of our continuing mission to provide you with exceptional heart care, we  have created designated Provider Care Teams.   These Care Teams include your primary Cardiologist (physician) and Advanced Practice Providers (APPs -  Physician Assistants and Nurse Practitioners) who all work together to provide you with the care you need, when you need it.  We recommend signing up for the patient portal called "MyChart".  Sign up information is provided on this After Visit Summary.  MyChart is used to connect with patients for Virtual Visits (Telemedicine).  Patients are able to view lab/test results, encounter notes, upcoming appointments, etc.  Non-urgent messages can be sent to your provider as well.   To learn more about what you can do with MyChart, go to [email or web address].    Your next appointment:   6-8 week(s)  The format for your next appointment:   In Person  Provider:    ONLY WITH Kate Sable, MD    Other Instructions   Important Information About Sugar         Signed, Kate Sable, MD  02/27/2022 4:15 PM    [place]

## 2022-03-01 ENCOUNTER — Other Ambulatory Visit: Payer: Self-pay | Admitting: Nurse Practitioner

## 2022-03-01 NOTE — Progress Notes (Signed)
Established patient visit   Patient: Martha Peterson   DOB: 12-20-1940   81 y.o. Female  MRN: [id number] Visit Date: 03/02/2022   Chief Complaint  Patient presents with   Blood Pressure Check   Medication Refill   Subjective    HPI  Follow up visit  Doing well with current prescriptions for pain management.  She is now taking oxycodone ER 18 mg twice daily.  Oxycodone 10 mg daily if needed for breakthrough pain.  Indication for chronic opioid: chronic pain and inflammatory polyarthritis. Patient unable to take NSAIDs due to treatment with blood thinners. 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  Medication and dose: Oxycodone XR 18 mg twice daily and oxycodone 39m QD prn for breakthrough pain  # pills per month: 60 oxycodone XR and 30 oxycodone 166mLast UDS date: 03/31/2021 Opioid Treatment Agreement signed (Y/N): yes Opioid Treatment Agreement last reviewed with patient:  03/31/2021 NCCSRS reviewed this encounter (include red flags): no  PDMP profile reviewed 03/01/2022 -  -overdose risk score is 110 with no red flags  or state indicators  -last fill of Oxycodone ER 1863mas 01/29/2022 -last fill Oxycodone [id number]/2023   Abdominal ultrasound done since last visit -monitoring for chronic elevation of renal functions and history of [name].  -IMPRESSION: 1. 15 mm left renal cyst, no imaging follow-up is recommended. Kidneys are otherwise unremarkable 2. Coarse heterogeneous liver parenchyma corresponding to history of liver disease. No focal hepatic abnormality 3. Status post cholecystectomy. Normal common bile duct diameter but mild intra hepatic biliary dilatation question due to postsurgical change, correlate with LFTs  Most recent LFTs done 10/28/2021 and were normal with improved and stable renal functions.   Patient recently started getting injections into her eyes. 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 Tolerating them  ok.    The patient states that she saw her cardiologist recently. He did increase her furosemide to twice daily to help get some fluid and increase potassium dosing. Will have recheck of BMP in approximately two weeks.   Having trouble with sleep. Taking taking three of the mirtazapine some nights to help with sleep. States that this helps her more than anytihing   Finally states that she does [facility] a [facility] Prior to Visit  Medication Sig   allopurinol (ZYLOPRIM) 100 MG tablet TAKE 2 TABLETS EVERY DAY   apixaban (ELIQUIS) 5 MG TABS tablet Take 1 tablet (5 mg total) by mouth 2 (two) times daily.   ascorbic acid (VITAMIN C) 500 MG tablet Take 500 mg by mouth daily.   aspirin EC 81 MG tablet Take 81 mg by mouth daily.   atorvastatin (LIPITOR) 10 MG tablet TAKE 1 TABLET EVERY DAY AT 6PM   carvedilol (COREG) 6.25 MG tablet TAKE 1 TABLET TWICE DAILY WITH MEALS   cholecalciferol (VITAMIN D) 400 UNITS TABS tablet Take 1,000 Units by mouth.   diclofenac sodium (VOLTAREN) 1 % GEL Apply 3 grams to 3 large joints, up to 3 times daily as needed.   fluticasone (FLONASE) 50 MCG/ACT nasal spray Place 2 sprays into both nostrils daily.   Ginger, Zingiber officinalis, (GINGER PO) Take by mouth.   IBU 600 MG tablet TAKE 1 TABLET EVERY 8 HOURS AS NEEDED   levocetirizine (XYZAL) 5 MG tablet TAKE 1 TABLET EVERY EVENING   levothyroxine (SYNTHROID) 137 MCG tablet TAKE 1 TABLET EVERY DAY BEFORE  BREAKFAST   losartan-hydrochlorothiazide (HYZAAR) 100-25 MG tablet TAKE 1 TABLET EVERY DAY   magnesium oxide (MAG-OX) 400 MG tablet Take 400 mg by mouth daily.    Misc Natural Products (TART CHERRY ADVANCED PO) Take by mouth daily.   Multiple Vitamins-Minerals (PRESERVISION AREDS 2 PO) Take by mouth 2 (two) times daily.   omega-3 acid ethyl esters (LOVAZA) 1 G capsule Take 1 g by mouth 2 (two) times daily.   potassium  chloride 20 MEQ TBCR Take 20 mEq by mouth daily.   tiZANidine (ZANAFLEX) 4 MG tablet TAKE 1 TABLET TWICE DAILY   torsemide 40 MG TABS Take 40 mg by mouth daily.   TURMERIC PO Take by mouth daily.   vitamin E 400 UNIT capsule Take 800 Units by mouth daily.    [DISCONTINUED] mirtazapine (REMERON) 7.5 MG tablet TAKE 1 TO 2 TABLETS EVERY EVENING   [DISCONTINUED] oxyCODONE ER 18 MG C12A Take 1 tablet by mouth 2 (two) times daily.   [DISCONTINUED] Oxycodone HCl 10 MG TABS Take 1 tablet (10 mg total) by mouth daily as needed.   [DISCONTINUED] predniSONE (DELTASONE) 5 MG tablet TAKE 2 TABLETS EVERY DAY WITH BREAKFAST   [DISCONTINUED] traZODone (DESYREL) 50 MG tablet Take 0.5-1 tablets (25-50 mg total) by mouth at bedtime as needed for sleep.   No facility-administered medications prior to visit.    Review of Systems  Constitutional:  Negative for activity change, appetite change, chills, fatigue and fever.  HENT:  Negative for congestion, postnasal drip, rhinorrhea, sinus pressure, sinus pain, sneezing and sore throat.   Eyes: Negative.   Respiratory:  Negative for cough, chest tightness, shortness of breath and wheezing.   Cardiovascular:  Positive for leg swelling. Negative for chest pain and palpitations.       Chronic a-fib  Gastrointestinal:  Negative for abdominal pain, constipation, diarrhea, nausea and vomiting.  Endocrine: Negative for cold intolerance, heat intolerance, polydipsia and polyuria.       Well managed hypothyroid   Genitourinary:  Negative for dyspareunia, dysuria, flank pain, frequency and urgency.  Musculoskeletal:  Positive for gait problem. Negative for arthralgias, back pain and myalgias.       Back pain and generalized joint pain well managed with current medication. She has tolerated this well and has no negative side effects.     Skin:  Negative for rash.  Allergic/Immunologic: Negative for environmental allergies.  Neurological:  Negative for dizziness, weakness  and headaches.  Hematological:  Negative for adenopathy.  Psychiatric/Behavioral:  The patient is not nervous/anxious.     Last CBC Lab Results  Component Value Date   WBC 3.8 03/31/2021   HGB 11.9 03/31/2021   HCT 35.4 03/31/2021   MCV 97 03/31/2021   MCH 32.4 03/31/2021   RDW 11.8 03/31/2021   PLT 126 (L) 25/95/6387   Last metabolic panel Lab Results  Component Value Date   GLUCOSE 66 (L) 10/28/2021   NA 138 10/28/2021   K 3.6 10/28/2021   CL 94 (L) 10/28/2021   CO2 29 10/28/2021   BUN 25 10/28/2021   CREATININE 1.30 (H) 10/28/2021   EGFR 41 (L) 10/28/2021   CALCIUM 9.1 10/28/2021   PROT 7.1 10/28/2021   ALBUMIN 4.0 10/28/2021   LABGLOB 3.1 10/28/2021   AGRATIO 1.3 10/28/2021   BILITOT 0.5 10/28/2021   ALKPHOS 113 10/28/2021   AST 35 10/28/2021   ALT 17 10/28/2021   ANIONGAP 12 10/16/2019   Last lipids Lab Results  Component Value Date   CHOL 101  03/31/2021   HDL 41 03/31/2021   LDLCALC 40 03/31/2021   TRIG 104 03/31/2021   CHOLHDL 2.5 03/31/2021   Last hemoglobin A1c Lab Results  Component Value Date   HGBA1C 5.9 (H) 03/31/2021   Last thyroid functions Lab Results  Component Value Date   TSH 1.600 03/31/2021       Objective     Today's Vitals   03/02/22 1600  BP: 131/76  Pulse: (Abnormal) 101  SpO2: 96%  Weight: 177 lb (80.3 kg)  Height: '5\' 2"'  (1.575 m)   Body mass index is 32.37 kg/m.   BP Readings from Last 3 Encounters:  03/02/22 131/76  02/27/22 132/70  12/30/21 112/62    Wt Readings from Last 3 Encounters:  03/02/22 177 lb (80.3 kg)  02/27/22 177 lb (80.3 kg)  12/30/21 177 lb (80.3 kg)    Physical Exam Vitals and nursing note reviewed.  Constitutional:      Appearance: Normal appearance. She is well-developed.  HENT:     Head: Normocephalic and atraumatic.     Nose: Nose normal.     Mouth/Throat:     Mouth: Mucous membranes are moist.     Pharynx: Oropharynx is clear.  Eyes:     Extraocular Movements: Extraocular  movements intact.     Conjunctiva/sclera: Conjunctivae normal.     Pupils: Pupils are equal, round, and reactive to light.  Neck:     Comments: Mild JVD present  Cardiovascular:     Rate and Rhythm: Normal rate. Rhythm irregular.     Pulses: Normal pulses.     Heart sounds: Murmur heard.     Comments: Irregular heart rhythm.  Pulmonary:     Effort: Pulmonary effort is normal.     Breath sounds: Normal breath sounds.  Abdominal:     General: Bowel sounds are normal. There is no distension.     Palpations: Abdomen is soft. There is no mass.     Tenderness: There is no abdominal tenderness. There is no right CVA tenderness, left CVA tenderness, guarding or rebound.     Hernia: No hernia is present.  Musculoskeletal:        General: Normal range of motion.     Cervical back: Normal range of motion and neck supple.     Left lower leg: Edema present.     Comments: Left foot deformity with swelling present and unchanged. ROM and strength of the left foot is diminished but at baseline.  The patient is using a walking cane to help her with mobility and balance.  Chronic low back pain making it difficult to walk in erect position. Bending at the waist and stooping to reach for things often increases pain. Leaning heavier on walking cane.    Lymphadenopathy:     Cervical: No cervical adenopathy.  Skin:    General: Skin is warm and dry.     Capillary Refill: Capillary refill takes less than 2 seconds.  Neurological:     General: No focal deficit present.     Mental Status: She is alert and oriented to person, place, and time.  Psychiatric:        Mood and Affect: Mood normal.        Behavior: Behavior normal.        Thought Content: Thought content normal.        Judgment: Judgment normal.       Assessment & Plan    1. Current moderate episode of major depressive disorder, unspecified  whether recurrent ([place]) Recently a bit worse with some trouble sleeping. Will increase remeron 15 mg  to 1.5 tablets daily. Reassess at next visit  - mirtazapine (REMERON) 15 MG tablet; Take 1.5 tablets QPM  Dispense: 135 tablet; Refill: 1  2. Stage 3 chronic kidney disease, unspecified whether stage 3a or 3b CKD ([place]) Reviewed most recent ultrasound of abdomen. She does have benign appearing cyst on left kidney with kidneys being otherwise unremarkable. Will repeat ultrasound in 1 year for surveillance.   3. [name] (nonalcoholic steatohepatitis) Reviewed abdominal ultrasound with the patient and her daughter. Does show Coarse heterogeneous liver parenchyma corresponding to history of liver disease. No focal hepatic abnormality. LFTs historically normal. Repeat ultrasound in one year for surveillance.   4. Hypokalemia Stable. Continue potassium supplement daily   5. Other osteoarthritis involving multiple joints Pain well managed with current doses of medication to treat chronic pain. She is taking oxycodone ER 18 mg twice daily and has oxycodone 10 mg dose which can be used daily if needed for acute pain. Two 30 day prescriptions were sent to her pharmacy today. Dates are 03/02/2022 and 03/30/2022.  - Oxycodone HCl 10 MG TABS; Take 1 tablet (10 mg total) by mouth daily as needed.  Dispense: 30 tablet; Refill: 0  6. Inflammatory polyarthritis ([place]) Pain well managed with current doses of medication to treat chronic pain. She is taking oxycodone ER 18 mg twice daily and has oxycodone 10 mg dose which can be used daily if needed for acute pain. Two 30 day prescriptions were sent to her pharmacy today. Dates are 03/02/2022 and 03/30/2022.  - oxyCODONE ER 18 MG C12A; Take 1 tablet by mouth 2 (two) times daily.  Dispense: 60 capsule; Refill: 0  7. Rheumatoid arthritis involving multiple sites with positive rheumatoid factor (HCC) Pain well managed with current doses of medication to treat chronic pain. She is taking oxycodone ER 18 mg twice daily and has oxycodone 10 mg dose which can be used daily if  needed for acute pain. Two 30 day prescriptions were sent to her pharmacy today. Dates are 03/02/2022 and 03/30/2022.  - predniSONE (DELTASONE) 5 MG tablet; TAKE 2 TABLETS EVERY DAY WITH BREAKFAST  Dispense: 180 tablet; Refill: 1  8. 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 Continue regular visits with vein and vascular as scheduled.   9. Chronic atrial fibrillation (HCC) Stable. Continue regular visits with cardiology as scheduled.     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, bilateral   Chronic atrial fibrillation (HCC)     Respiratory   Acute non-recurrent maxillary sinusitis   Relevant Medications   predniSONE (DELTASONE) 5 MG tablet     Digestive   [name] (nonalcoholic steatohepatitis)     Musculoskeletal and Integument   Inflammatory polyarthritis (HCC)   Relevant Medications   predniSONE (DELTASONE) 5 MG tablet   oxyCODONE ER 18 MG C12A   Oxycodone HCl 10 MG TABS   Degenerative joint disease involving multiple joints   Relevant Medications   predniSONE (DELTASONE) 5 MG tablet   oxyCODONE ER 18 MG C12A   Oxycodone HCl 10 MG TABS     Genitourinary   Stage 3 chronic kidney disease (HCC)     Other   Hypokalemia   Current moderate episode of major depressive disorder (HCC) - Primary   Relevant Medications   mirtazapine (REMERON) 15 MG tablet  Return in about 2 months (around 05/02/2022) for medicare wellness, blood pressure, med refills. will ned flu shot during visit .         Ronnell Freshwater, NP  [facility] [phone] (phone) [phone] (fax)  [place]

## 2022-03-02 ENCOUNTER — Encounter: Payer: Self-pay | Admitting: Nurse Practitioner

## 2022-03-02 ENCOUNTER — Ambulatory Visit (INDEPENDENT_AMBULATORY_CARE_PROVIDER_SITE_OTHER): Payer: Medicare HMO | Admitting: Nurse Practitioner

## 2022-03-02 VITALS — BP 131/76 | HR 101 | Ht 62.0 in | Wt 177.0 lb

## 2022-03-02 DIAGNOSIS — M158 Other polyosteoarthritis: Secondary | ICD-10-CM | POA: Diagnosis not present

## 2022-03-02 DIAGNOSIS — K7581 Nonalcoholic steatohepatitis (NASH): Secondary | ICD-10-CM | POA: Diagnosis not present

## 2022-03-02 DIAGNOSIS — I482 Chronic atrial fibrillation, unspecified: Secondary | ICD-10-CM

## 2022-03-02 DIAGNOSIS — N183 Chronic kidney disease, stage 3 unspecified: Secondary | ICD-10-CM

## 2022-03-02 DIAGNOSIS — I6523 Occlusion and stenosis of bilateral carotid arteries: Secondary | ICD-10-CM

## 2022-03-02 DIAGNOSIS — M064 Inflammatory polyarthropathy: Secondary | ICD-10-CM

## 2022-03-02 DIAGNOSIS — J0100 Acute maxillary sinusitis, unspecified: Secondary | ICD-10-CM

## 2022-03-02 DIAGNOSIS — F321 Major depressive disorder, single episode, moderate: Secondary | ICD-10-CM | POA: Diagnosis not present

## 2022-03-02 DIAGNOSIS — M0579 Rheumatoid arthritis with rheumatoid factor of multiple sites without organ or systems involvement: Secondary | ICD-10-CM

## 2022-03-02 DIAGNOSIS — E876 Hypokalemia: Secondary | ICD-10-CM

## 2022-03-02 MED ORDER — OXYCODONE HCL 10 MG PO TABS: 10.0000 mg | ORAL_TABLET | Freq: Every day | ORAL | 0 refills | Status: DC | PRN

## 2022-03-02 MED ORDER — PREDNISONE 5 MG PO TABS: ORAL_TABLET | ORAL | 1 refills | Status: DC

## 2022-03-02 MED ORDER — OXYCODONE ER 18 MG PO C12A: 1.0000 | EXTENDED_RELEASE_CAPSULE | Freq: Two times a day (BID) | ORAL | 0 refills | Status: DC

## 2022-03-02 MED ORDER — MIRTAZAPINE 15 MG PO TABS: ORAL_TABLET | ORAL | 1 refills | Status: DC

## 2022-03-04 DIAGNOSIS — H353211 Exudative age-related macular degeneration, right eye, with active choroidal neovascularization: Secondary | ICD-10-CM | POA: Diagnosis not present

## 2022-03-12 DIAGNOSIS — Z1231 Encounter for screening mammogram for malignant neoplasm of breast: Secondary | ICD-10-CM | POA: Diagnosis not present

## 2022-03-18 ENCOUNTER — Other Ambulatory Visit: Payer: Self-pay | Admitting: Cardiology

## 2022-03-18 DIAGNOSIS — R600 Localized edema: Secondary | ICD-10-CM | POA: Diagnosis not present

## 2022-03-18 DIAGNOSIS — I4891 Unspecified atrial fibrillation: Secondary | ICD-10-CM | POA: Diagnosis not present

## 2022-03-18 DIAGNOSIS — E876 Hypokalemia: Secondary | ICD-10-CM | POA: Diagnosis not present

## 2022-03-19 LAB — BASIC METABOLIC PANEL
BUN/Creatinine Ratio: 26 (ref 12–28)
BUN: 35 mg/dL — ABNORMAL HIGH (ref 8–27)
CO2: 28 mmol/L (ref 20–29)
Calcium: 9.1 mg/dL (ref 8.7–10.3)
Chloride: 94 mmol/L — ABNORMAL LOW (ref 96–106)
Creatinine, Ser: 1.33 mg/dL — ABNORMAL HIGH (ref 0.57–1.00)
Glucose: 96 mg/dL (ref 70–99)
Potassium: 4.1 mmol/L (ref 3.5–5.2)
Sodium: 138 mmol/L (ref 134–144)
eGFR: 40 mL/min/{1.73_m2} — ABNORMAL LOW (ref >59)

## 2022-04-01 DIAGNOSIS — H353211 Exudative age-related macular degeneration, right eye, with active choroidal neovascularization: Secondary | ICD-10-CM | POA: Diagnosis not present

## 2022-04-04 ENCOUNTER — Other Ambulatory Visit: Payer: Self-pay | Admitting: Nurse Practitioner

## 2022-04-13 ENCOUNTER — Encounter (INDEPENDENT_AMBULATORY_CARE_PROVIDER_SITE_OTHER): Payer: Self-pay

## 2022-04-17 ENCOUNTER — Ambulatory Visit: Payer: Medicare HMO | Admitting: Cardiology

## 2022-04-23 ENCOUNTER — Other Ambulatory Visit: Payer: Self-pay | Admitting: Nurse Practitioner

## 2022-04-23 DIAGNOSIS — M064 Inflammatory polyarthropathy: Secondary | ICD-10-CM

## 2022-04-23 DIAGNOSIS — M1612 Unilateral primary osteoarthritis, left hip: Secondary | ICD-10-CM

## 2022-05-01 ENCOUNTER — Ambulatory Visit: Payer: Medicare HMO | Attending: Cardiology | Admitting: Cardiology

## 2022-05-01 ENCOUNTER — Encounter: Payer: Self-pay | Admitting: Cardiology

## 2022-05-01 ENCOUNTER — Ambulatory Visit: Payer: Medicare HMO | Admitting: Cardiology

## 2022-05-01 VITALS — BP 128/84 | HR 97 | Ht 62.0 in | Wt 167.0 lb

## 2022-05-01 DIAGNOSIS — I10 Essential (primary) hypertension: Secondary | ICD-10-CM | POA: Diagnosis not present

## 2022-05-01 DIAGNOSIS — I4891 Unspecified atrial fibrillation: Secondary | ICD-10-CM | POA: Diagnosis not present

## 2022-05-01 DIAGNOSIS — I5189 Other ill-defined heart diseases: Secondary | ICD-10-CM

## 2022-05-01 MED ORDER — APIXABAN 5 MG PO TABS: 5.0000 mg | ORAL_TABLET | Freq: Two times a day (BID) | ORAL | 3 refills | Status: DC

## 2022-05-01 NOTE — Progress Notes (Signed)
Cardiology Office Note:    Date:  05/01/2022   ID:  Martha Peterson, DOB 1940/08/09, MRN [id number]  PCP:  Martha Freshwater, NP  Cardiologist:  None  Electrophysiologist:  None   Referring MD: Martha Freshwater, NP   Chief Complaint  Patient presents with   Follow-up    6-8 week follow up, no new cardiac concerns     History of Present Illness:    Martha Peterson is a 81 y.o. female with a hx of permanent atrial fibrillation, HFpEF, hypertension, hyperlipidemia, carotid stenosis, innominate artery stenosis who presents for follow-up.  Being seen for A-fib, leg edema.  Torsemide increased to 40 mg after last visit.  She states her leg edema is much improved on current dose of torsemide.  Denies palpitations, tolerating all medications as prescribed.  Denies any bleeding issues with taking Eliquis.   Prior notes Echo 09/2019 EF 60 to 20%, grade 2 diastolic dysfunction, severely dilated left atrium Patient has a history of mitral regurgitation being followed periodically with echocardiogram.  She has a known history of carotid stenosis with carotid bruit.CT angio of the neck showed high-grade calcific stenosis innominate artery, flow-limiting stenosis in the right carotid system.  There is sclerosis with calcification in the carotid bifurcation bilaterally without significant stenosis.  She is seeing vascular surgery for carotid artery disease.  She takes a baby aspirin and Lipitor 10 mg.  Has a history of liver abnormality hence the low-dose statin.     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   Microhematuria    Mild mitral regurgitation    Mild tricuspid regurgitation    Osteopenia    Pulmonary  fibrosis ([facility])    Urinary incontinence     Past Surgical History:  Procedure Laterality Date   ABDOMINAL HYSTERECTOMY     ANKLE SURGERY Left    APPENDECTOMY     CAROTID PTA/STENT INTERVENTION N/A 07/17/2019   Procedure: CAROTID PTA/STENT INTERVENTION;  Surgeon: Martha Huxley, MD;  Location: [facility];  Service: Cardiovascular;  Laterality: N/A;   CAROTID-SUBCLAVIAN BYPASS GRAFT N/A 07/26/2019   Procedure: BYPASS GRAFT CAROTID-SUBCLAVIAN ( CAROTID TO CAROTID BYPASS);  Surgeon: Martha Huxley, MD;  Location: [facility];  Service: Vascular;  Laterality: N/A;   CHOLECYSTECTOMY     COLONOSCOPY WITH PROPOFOL N/A 12/04/2014   Procedure: COLONOSCOPY WITH PROPOFOL;  Surgeon: Martha Sails, MD;  Location: [facility];  Service: Endoscopy;  Laterality: N/A;   FRACTURE SURGERY     Femur Fx; LT Ankle Pinning   JOINT REPLACEMENT     RT TKR   Patial Thyroid Resection      Current Medications: Current Meds  Medication Sig   allopurinol (ZYLOPRIM) 100 MG tablet TAKE 2 TABLETS EVERY DAY   ascorbic acid (VITAMIN C) 500 MG tablet Take 500 mg by mouth daily.   aspirin EC 81 MG tablet Take 81 mg by mouth daily.   atorvastatin (LIPITOR) 10 MG tablet TAKE 1 TABLET EVERY DAY AT 6PM   carvedilol (COREG) 6.25 MG tablet TAKE 1 TABLET TWICE DAILY WITH MEALS   cholecalciferol (VITAMIN D) 400 UNITS TABS tablet Take 1,000 Units by  mouth.   diclofenac sodium (VOLTAREN) 1 % GEL Apply 3 grams to 3 large joints, up to 3 times daily as needed.   fluticasone (FLONASE) 50 MCG/ACT nasal spray Place 2 sprays into both nostrils daily.   Ginger, Zingiber officinalis, (GINGER PO) Take by mouth.   IBU 600 MG tablet TAKE 1 TABLET EVERY 8 HOURS AS NEEDED   levocetirizine (XYZAL) 5 MG tablet TAKE 1 TABLET EVERY EVENING   levothyroxine (SYNTHROID) 137 MCG tablet TAKE 1 TABLET EVERY DAY BEFORE BREAKFAST   losartan-hydrochlorothiazide (HYZAAR) 100-25 MG tablet TAKE 1 TABLET EVERY DAY   magnesium oxide (MAG-OX) 400 MG  tablet Take 400 mg by mouth daily.    mirtazapine (REMERON) 15 MG tablet Take 1.5 tablets QPM   Misc Natural Products (TART CHERRY ADVANCED PO) Take by mouth daily.   Multiple Vitamins-Minerals (PRESERVISION AREDS 2 PO) Take by mouth 2 (two) times daily.   omega-3 acid ethyl esters (LOVAZA) 1 G capsule Take 1 g by mouth 2 (two) times daily.   oxyCODONE ER 18 MG C12A Take 1 tablet by mouth 2 (two) times daily.   Oxycodone HCl 10 MG TABS Take 1 tablet (10 mg total) by mouth daily as needed.   potassium chloride 20 MEQ TBCR Take 20 mEq by mouth daily.   predniSONE (DELTASONE) 5 MG tablet TAKE 2 TABLETS EVERY DAY WITH BREAKFAST   tiZANidine (ZANAFLEX) 4 MG tablet TAKE 1 TABLET TWICE DAILY   torsemide 40 MG TABS Take 40 mg by mouth daily.   TURMERIC PO Take by mouth daily.   vitamin E 400 UNIT capsule Take 800 Units by mouth daily.    [DISCONTINUED] apixaban (ELIQUIS) 5 MG TABS tablet Take 1 tablet (5 mg total) by mouth 2 (two) times daily.     Allergies:   Latex, Codeine, Levaquin [levofloxacin], and Tape   Social History   Socioeconomic History   Marital status: Widowed    Spouse name: Martha Peterson   Number of children: Not on file   Years of education: Not on file   Highest education level: Not on file  Occupational History   Not on file  Tobacco Use   Smoking status: Former    Years: 1.00    Types: Cigarettes   Smokeless tobacco: Never  Vaping Use   Vaping Use: Never used  Substance and Sexual Activity   Alcohol use: No   Drug use: No   Sexual activity: Not Currently  Other Topics Concern   Not on file  Social History Narrative   Lives at home with husband in private residence   Social Determinants of Health   Financial Resource Strain: Not on file  Food Insecurity: Not on file  Transportation Needs: Not on file  Physical Activity: Not on file  Stress: Not on file  Social Connections: Not on file     Family History: The patient's family history includes Anxiety disorder  in her daughter; Arthritis in her daughter; Depression in her daughter; Diabetes in her daughter, father, and son; Heart Problems in her father, mother, and sister; Hypertension in her son; Lupus in her sister; Neuropathy in her daughter; Thyroid disease in her sister.  ROS:   Please see the history of present illness.     All other systems reviewed and are negative.  EKGs/Labs/Other Studies Reviewed:      EKG:  EKG is  ordered today.  EKG shows atrial fibrillation, heart rate 97  Recent Labs: 10/28/2021: ALT 17 03/18/2022: BUN 35; Creatinine, Ser 1.33;  Potassium 4.1; Sodium 138  Recent Lipid Panel    Component Value Date/Time   CHOL 101 03/31/2021 1109   TRIG 104 03/31/2021 1109   HDL 41 03/31/2021 1109   CHOLHDL 2.5 03/31/2021 1109   LDLCALC 40 03/31/2021 1109    Physical Exam:    VS:  BP 128/84 (BP Location: Left Arm, Patient Position: Sitting, Cuff Size: Normal)   Pulse 97   Ht '5\' 2"'$  (1.575 m)   Wt 167 lb (75.8 kg)   SpO2 97%   BMI 30.54 kg/m     Wt Readings from Last 3 Encounters:  05/01/22 167 lb (75.8 kg)  03/02/22 177 lb (80.3 kg)  02/27/22 177 lb (80.3 kg)     GEN:  Well nourished, well developed in no acute distress HEENT: Normal NECK: No JVD; bilateral carotid bruits noted CARDIAC: irregular irregular, 2/6 systolic murmur noted, RESPIRATORY:  Clear to auscultation without rales, wheezing or rhonchi  ABDOMEN: Soft, non-tender, non-distended MUSCULOSKELETAL: Trace to 1+ edema; varicose veins noted SKIN: Warm and dry NEUROLOGIC:  Alert and oriented x 3 PSYCHIATRIC:  Normal affect   ASSESSMENT:    1. Atrial fibrillation, unspecified type ([place])   2. Grade II diastolic dysfunction   3. Primary hypertension    PLAN:    In order of problems listed above:  Persistent atrial fibrillation, CHA2DS2-VASc of 5 (age, htn, vasc, gender).  Heart rate controlled.  Cont coreg 6.25 mg twice daily, Eliquis 5 mg twice daily.   Grade 2 diastolic dysfunction, continue  torsemide to 40 mg daily Hypertension, BP controlled. Continue Coreg, Hyzaar.  Follow-up in 6 months  This note was generated in part or whole with voice recognition software. Voice recognition is usually quite accurate but there are transcription errors that can and very often do occur. I apologize for any typographical errors that were not detected and corrected.  Medication Adjustments/Labs and Tests Ordered: Current medicines are reviewed at length with the patient today.  Concerns regarding medicines are outlined above.  Orders Placed This Encounter  Procedures   EKG 12-Lead    Meds ordered this encounter  Medications   apixaban (ELIQUIS) 5 MG TABS tablet    Sig: Take 1 tablet (5 mg total) by mouth 2 (two) times daily.    Dispense:  180 tablet    Refill:  3     Patient Instructions  Medication Instructions:   Your physician recommends that you continue on your current medications as directed. Please refer to the Current Medication list given to you today.   *If you need a refill on your cardiac medications before your next appointment, please call your pharmacy*     Follow-Up: At [facility], you and your health needs are our priority.  As part of our continuing mission to provide you with exceptional heart care, we have created designated Provider Care Teams.  These Care Teams include your primary Cardiologist (physician) and Advanced Practice Providers (APPs -  Physician Assistants and Nurse Practitioners) who all work together to provide you with the care you need, when you need it.  We recommend signing up for the patient portal called "MyChart".  Sign up information is provided on this After Visit Summary.  MyChart is used to connect with patients for Virtual Visits (Telemedicine).  Patients are able to view lab/test results, encounter notes, upcoming appointments, etc.  Non-urgent messages can be sent to your provider as well.   To learn more about what you  can do with MyChart, go  to [email or web address].    Your next appointment:   6 month(s)  The format for your next appointment:   In Person  Provider:   Kate Sable, MD    Other Instructions    Important Information About Sugar         Signed, Kate Sable, MD  05/01/2022 12:08 PM    [place]

## 2022-05-01 NOTE — Patient Instructions (Signed)
Medication Instructions:   Your physician recommends that you continue on your current medications as directed. Please refer to the Current Medication list given to you today.   *If you need a refill on your cardiac medications before your next appointment, please call your pharmacy*    Follow-Up: At [facility], you and your health needs are our priority.  As part of our continuing mission to provide you with exceptional heart care, we have created designated Provider Care Teams.  These Care Teams include your primary Cardiologist (physician) and Advanced Practice Providers (APPs -  Physician Assistants and Nurse Practitioners) who all work together to provide you with the care you need, when you need it.  We recommend signing up for the patient portal called "MyChart".  Sign up information is provided on this After Visit Summary.  MyChart is used to connect with patients for Virtual Visits (Telemedicine).  Patients are able to view lab/test results, encounter notes, upcoming appointments, etc.  Non-urgent messages can be sent to your provider as well.   To learn more about what you can do with MyChart, go to [email or web address].    Your next appointment:   6 month(s)  The format for your next appointment:   In Person  Provider:   Brian Agbor-Etang, MD    Other Instructions    Important Information About Sugar       

## 2022-05-02 ENCOUNTER — Other Ambulatory Visit: Payer: Self-pay | Admitting: Nurse Practitioner

## 2022-05-02 DIAGNOSIS — Z8739 Personal history of other diseases of the musculoskeletal system and connective tissue: Secondary | ICD-10-CM

## 2022-05-02 DIAGNOSIS — I10 Essential (primary) hypertension: Secondary | ICD-10-CM

## 2022-05-05 ENCOUNTER — Encounter: Payer: Self-pay | Admitting: Nurse Practitioner

## 2022-05-05 ENCOUNTER — Ambulatory Visit (INDEPENDENT_AMBULATORY_CARE_PROVIDER_SITE_OTHER): Payer: Medicare HMO | Admitting: Nurse Practitioner

## 2022-05-05 VITALS — BP 124/75 | HR 66 | Resp 18 | Ht 62.0 in | Wt 175.0 lb

## 2022-05-05 DIAGNOSIS — I10 Essential (primary) hypertension: Secondary | ICD-10-CM | POA: Diagnosis not present

## 2022-05-05 DIAGNOSIS — Z23 Encounter for immunization: Secondary | ICD-10-CM | POA: Diagnosis not present

## 2022-05-05 DIAGNOSIS — Z8739 Personal history of other diseases of the musculoskeletal system and connective tissue: Secondary | ICD-10-CM

## 2022-05-05 DIAGNOSIS — I6523 Occlusion and stenosis of bilateral carotid arteries: Secondary | ICD-10-CM

## 2022-05-05 DIAGNOSIS — I482 Chronic atrial fibrillation, unspecified: Secondary | ICD-10-CM | POA: Diagnosis not present

## 2022-05-05 DIAGNOSIS — Z0000 Encounter for general adult medical examination without abnormal findings: Secondary | ICD-10-CM | POA: Diagnosis not present

## 2022-05-05 DIAGNOSIS — M064 Inflammatory polyarthropathy: Secondary | ICD-10-CM | POA: Diagnosis not present

## 2022-05-05 DIAGNOSIS — M158 Other polyosteoarthritis: Secondary | ICD-10-CM | POA: Diagnosis not present

## 2022-05-05 MED ORDER — OXYCODONE ER 18 MG PO C12A: 18.0000 mg | EXTENDED_RELEASE_CAPSULE | Freq: Two times a day (BID) | ORAL | 0 refills | Status: DC

## 2022-05-05 MED ORDER — ALLOPURINOL 100 MG PO TABS: 200.0000 mg | ORAL_TABLET | Freq: Every day | ORAL | 1 refills | Status: DC

## 2022-05-05 MED ORDER — OXYCODONE HCL 10 MG PO TABS: 10.0000 mg | ORAL_TABLET | Freq: Every day | ORAL | 0 refills | Status: DC | PRN

## 2022-05-05 MED ORDER — LOSARTAN POTASSIUM-HCTZ 100-25 MG PO TABS: 1.0000 | ORAL_TABLET | Freq: Every day | ORAL | 1 refills | Status: DC

## 2022-05-05 NOTE — Progress Notes (Signed)
Subjective:   Martha Peterson is a 81 y.o. female who presents for Medicare Annual (Subsequent) preventive examination. Doing well with current prescriptions for pain management.  She is now taking oxycodone ER 18 mg twice daily.  Oxycodone 10 mg daily if needed for breakthrough pain.  Indication for chronic opioid: chronic pain and inflammatory polyarthritis. Patient unable to take NSAIDs due to treatment with blood thinners. 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  Medication and dose: Oxycodone XR 18 mg twice daily and oxycodone '10mg'$  QD prn for breakthrough pain  # pills per month: 60 oxycodone XR and 30 oxycodone '10mg'$  Last UDS date: 03/31/2021 Opioid Treatment Agreement signed (Y/N): yes Opioid Treatment Agreement last reviewed with patient:  03/31/2021 [facility] reviewed this encounter (include red flags): no  PDMP profile reviewed 05/05/2022 -  -overdose risk score is 100 with no red flags  or state indicators  -last fill of Oxycodone ER '18mg'$  was 04/01/2022 -last fill Oxycodone '10mg'$  04/01/2022 --mammogram 03/12/2022 - benign  -will get flu shot today  -recently saw her cardiologist last week. All was good. Will go back in 6 months.   Review of Systems    Review of Systems  Constitutional:  Positive for malaise/fatigue. Negative for chills and fever.  HENT:  Negative for congestion, sinus pain and sore throat.   Eyes: Negative.   Respiratory:  Negative for cough, shortness of breath and wheezing.   Cardiovascular:  Positive for leg swelling. Negative for chest pain and palpitations.  Gastrointestinal:  Negative for constipation, diarrhea, nausea and vomiting.       [name]. Most recent abdominal ultrasound stable .  Genitourinary: Negative.   Musculoskeletal:  Positive for back pain, joint pain and myalgias.       Chronic back pain. Well managed on current medication.   Skin: Negative.   Neurological:  Negative for dizziness and headaches.   Endo/Heme/Allergies:  Does not bruise/bleed easily.  Psychiatric/Behavioral:  Negative for depression. The patient is not nervous/anxious.           Objective:    Today's Vitals   05/05/22 1347  BP: 124/75  Pulse: 66  Resp: 18  SpO2: 99%  Weight: 175 lb (79.4 kg)  Height: '5\' 2"'$  (1.575 m)   Body mass index is 32.01 kg/m.    Row Labels 10/20/2019    9:07 AM 07/26/2019    9:20 AM 07/20/2019   10:22 AM 07/17/2019    9:01 AM 10/28/2018    8:53 AM 10/28/2018    8:43 AM 09/23/2017    1:33 PM  Advanced Directives   Section Header. No data exists in this row.         Does Patient Have a Medical Advance Directive?   No No No No No No No  Would patient like information on creating a medical advance directive?   No - Patient declined No - Patient declined No - Patient declined Yes (MAU/Ambulatory/Procedural Areas - Information given) No - Patient declined No - Patient declined No - Patient declined    Current Medications (verified) Outpatient Encounter Medications as of 05/05/2022  Medication Sig   apixaban (ELIQUIS) 5 MG TABS tablet Take 1 tablet (5 mg total) by mouth 2 (two) times daily.   ascorbic acid (VITAMIN C) 500 MG tablet Take 500 mg by mouth daily.   aspirin EC 81 MG tablet Take 81 mg by mouth daily.   atorvastatin (LIPITOR) 10 MG tablet TAKE 1 TABLET EVERY DAY AT  6PM   carvedilol (COREG) 6.25 MG tablet TAKE 1 TABLET TWICE DAILY WITH MEALS   cholecalciferol (VITAMIN D) 400 UNITS TABS tablet Take 1,000 Units by mouth.   diclofenac sodium (VOLTAREN) 1 % GEL Apply 3 grams to 3 large joints, up to 3 times daily as needed.   fluticasone (FLONASE) 50 MCG/ACT nasal spray Place 2 sprays into both nostrils daily.   Ginger, Zingiber officinalis, (GINGER PO) Take by mouth.   IBU 600 MG tablet TAKE 1 TABLET EVERY 8 HOURS AS NEEDED   levocetirizine (XYZAL) 5 MG tablet TAKE 1 TABLET EVERY EVENING   levothyroxine (SYNTHROID) 137 MCG tablet TAKE 1 TABLET EVERY DAY BEFORE BREAKFAST   magnesium  oxide (MAG-OX) 400 MG tablet Take 400 mg by mouth daily.    mirtazapine (REMERON) 15 MG tablet Take 1.5 tablets QPM   Misc Natural Products (TART CHERRY ADVANCED PO) Take by mouth daily.   Multiple Vitamins-Minerals (PRESERVISION AREDS 2 PO) Take by mouth 2 (two) times daily.   omega-3 acid ethyl esters (LOVAZA) 1 G capsule Take 1 g by mouth 2 (two) times daily.   potassium chloride 20 MEQ TBCR Take 20 mEq by mouth daily.   predniSONE (DELTASONE) 5 MG tablet TAKE 2 TABLETS EVERY DAY WITH BREAKFAST   tiZANidine (ZANAFLEX) 4 MG tablet TAKE 1 TABLET TWICE DAILY   torsemide 40 MG TABS Take 40 mg by mouth daily.   TURMERIC PO Take by mouth daily.   vitamin E 400 UNIT capsule Take 800 Units by mouth daily.    [DISCONTINUED] allopurinol (ZYLOPRIM) 100 MG tablet TAKE 2 TABLETS EVERY DAY   [DISCONTINUED] losartan-hydrochlorothiazide (HYZAAR) 100-25 MG tablet TAKE 1 TABLET EVERY DAY   [DISCONTINUED] oxyCODONE ER 18 MG C12A Take 1 tablet by mouth 2 (two) times daily.   [DISCONTINUED] Oxycodone HCl 10 MG TABS Take 1 tablet (10 mg total) by mouth daily as needed.   allopurinol (ZYLOPRIM) 100 MG tablet Take 2 tablets (200 mg total) by mouth daily.   losartan-hydrochlorothiazide (HYZAAR) 100-25 MG tablet Take 1 tablet by mouth daily.   oxyCODONE ER 18 MG C12A Take 18 mg by mouth 2 (two) times daily.   Oxycodone HCl 10 MG TABS Take 1 tablet (10 mg total) by mouth daily as needed.   [DISCONTINUED] oxyCODONE ER 18 MG C12A Take 18 mg by mouth 2 (two) times daily.   [DISCONTINUED] Oxycodone HCl 10 MG TABS Take 1 tablet (10 mg total) by mouth daily as needed.   No facility-administered encounter medications on file as of 05/05/2022.    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   Microhematuria    Mild mitral regurgitation    Mild tricuspid regurgitation    Osteopenia    Pulmonary fibrosis (HCC)    Urinary incontinence    Past Surgical History:  Procedure Laterality Date   ABDOMINAL HYSTERECTOMY     ANKLE SURGERY Left    APPENDECTOMY     CAROTID PTA/STENT INTERVENTION N/A 07/17/2019   Procedure: CAROTID PTA/STENT INTERVENTION;  Surgeon: Algernon Huxley, MD;  Location: [facility];  Service: Cardiovascular;  Laterality: N/A;   CAROTID-SUBCLAVIAN BYPASS GRAFT N/A 07/26/2019   Procedure: BYPASS GRAFT CAROTID-SUBCLAVIAN ( CAROTID TO CAROTID BYPASS);  Surgeon: Algernon Huxley, MD;  Location: [facility];  Service: Vascular;  Laterality: N/A;   CHOLECYSTECTOMY     COLONOSCOPY WITH PROPOFOL N/A 12/04/2014   Procedure: COLONOSCOPY WITH PROPOFOL;  Surgeon: Lollie Sails, MD;  Location: [facility];  Service: Endoscopy;  Laterality: N/A;   FRACTURE SURGERY     Femur Fx; LT Ankle Pinning   JOINT REPLACEMENT     RT TKR   Patial Thyroid Resection     Family History  Problem Relation Age of Onset   Heart Problems Mother    Heart Problems Father    Diabetes Father    Lupus Sister    Thyroid disease Sister    Heart Problems Sister    Hypertension Son    Diabetes Son        borderline    Arthritis Daughter        in the left knee    Diabetes Daughter    Neuropathy Daughter    Depression Daughter    Anxiety disorder Daughter    Social History   Socioeconomic History   Marital status: Widowed    Spouse name: Jeneen Rinks   Number of children: Not on file   Years of education: Not on file   Highest education level: Not on file  Occupational History   Not on file  Tobacco Use   Smoking status: Former    Years: 1.00    Types: Cigarettes   Smokeless tobacco: Never  Vaping Use   Vaping Use: Never used  Substance and Sexual  Activity   Alcohol use: No   Drug use: No   Sexual activity: Not Currently  Other Topics Concern   Not on file  Social History Narrative   Lives at home with husband in private residence   Social Determinants of Health   Financial Resource Strain: Not on file  Food Insecurity: Not on file  Transportation Needs: Not on file  Physical Activity: Not on file  Stress: Not on file  Social Connections: Not on file   Physical Exam Vitals and nursing note reviewed.  Constitutional:      Appearance: Normal appearance. She is well-developed.  HENT:     Head: Normocephalic and atraumatic.     Nose: Nose normal.     Mouth/Throat:     Mouth: Mucous membranes are moist.     Pharynx: Oropharynx is clear.  Eyes:     Extraocular Movements: Extraocular movements intact.     Conjunctiva/sclera: Conjunctivae normal.     Pupils: Pupils are equal, round, and reactive to light.  Neck:     Vascular: No carotid bruit.  Cardiovascular:     Rate and Rhythm: Normal rate. Rhythm irregular.     Pulses: Normal pulses.     Heart sounds: Murmur heard.  Pulmonary:     Effort: Pulmonary effort is normal.     Breath sounds: Normal breath sounds.  Abdominal:     General: Bowel sounds are normal. There is no distension.     Palpations: Abdomen is soft. There is no mass.     Tenderness: There is no abdominal tenderness. There is no right CVA tenderness, left CVA tenderness, guarding or rebound.     Hernia: No hernia is present.  Musculoskeletal:        General: Normal range of motion.     Cervical back: Normal range  of motion and neck supple.     Comments:  Left foot deformity with swelling present and unchanged. ROM and strength of the left foot is diminished but at baseline.  The patient is using a walking cane to help her with mobility and balance.  Chronic low back pain making it difficult to walk in erect position. Bending at the waist and stooping to reach for things often increases pain. Leaning  heavier on walking cane.gait slower than usual today   Lymphadenopathy:     Cervical: No cervical adenopathy.  Skin:    General: Skin is warm and dry.     Capillary Refill: Capillary refill takes less than 2 seconds.  Neurological:     General: No focal deficit present.     Mental Status: She is alert and oriented to person, place, and time.  Psychiatric:        Mood and Affect: Mood normal.        Behavior: Behavior normal.        Thought Content: Thought content normal.        Judgment: Judgment normal.      Tobacco Counseling Counseling given: Not Answered   Clinical Intake:  Pre-visit preparation completed: Yes  Pain :  (patient has chronic and stable pain level)     BMI - recorded: 32.01 Nutritional Status: BMI > 30  Obese Nutritional Risks: None Diabetes: No  How often do you need to have someone help you when you read instructions, pamphlets, or other written materials from your doctor or pharmacy?: 1 - Never  Diabetic?No  Interpreter Needed?: No    Activities of Daily Living   Row Labels 05/05/2022    1:52 PM 12/30/2021    3:58 PM  In your present state of health, do you have any difficulty performing the following activities:   Section Header. No data exists in this row.    Hearing?   0 0  Vision?   0 0  Difficulty concentrating or making decisions?   0 0  Walking or climbing stairs?   1 0  Dressing or bathing?   0 0  Doing errands, shopping?   0 0    Patient Care Team: Ronnell Freshwater, NP as PCP - General (Family Medicine) Lloyd Huger, MD as Consulting Physician (Oncology)  Indicate any recent Medical Services you may have received from other than [facility] providers in the past year (date may be approximate).     Assessment:  1. Encounter for Medicare annual wellness exam Annual medicare wellness visit toda y  2. Inflammatory polyarthritis (HCC) Stable pain control. May continue oxycodone C12A twice daily for pain management. Two 30  day prescriptions sent to her local pharamcy. Dates are 05/05/2022 and 06/02/2022.  - oxyCODONE ER 18 MG C12A; Take 18 mg by mouth 2 (two) times daily.  Dispense: 60 capsule; Refill: 0  3. Other osteoarthritis involving multiple joints May take oxycodone 10 mg, 1/2 to 1 tablet daily as needed for breakthrough pain. Two 30 day prescriptions sent to her pharmacy today. Dates are 05/05/2022 and 06/02/2022.  - Oxycodone HCl 10 MG TABS; Take 1 tablet (10 mg total) by mouth daily as needed.  Dispense: 30 tablet; Refill: 0  4. H/O: gout Conitnue allopurinol 200 mg daily to prevent flares - allopurinol (ZYLOPRIM) 100 MG tablet; Take 2 tablets (200 mg total) by mouth daily.  Dispense: 180 tablet; Refill: 1  5. Chronic atrial fibrillation (HCC) Stable. Continue regular visits with cardiology as scheduled  6. Essential hypertension Stable. Continue bp medication as prescribed.  - losartan-hydrochlorothiazide (HYZAAR) 100-25 MG tablet; Take 1 tablet by mouth daily.  Dispense: 90 tablet; Refill: 1  7. 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 Need for influenza vaccination Flu vaccine administered during today's visit.  - Flu Vaccine QUAD High Dose(Fluad)   Hearing/Vision screen No results found.  Dietary issues and exercise activities discussed:    Depression Screen   Row Labels 05/05/2022    1:51 PM 03/02/2022    4:02 PM 12/30/2021    3:58 PM 10/28/2021    4:20 PM 08/28/2021    4:23 PM 06/30/2021   11:30 AM 05/30/2021    8:06 AM  PHQ 2/9 Scores   Section Header. No data exists in this row.         PHQ - 2 Score   0 0 0 0 0 0 0  PHQ- 9 Score   1 0 0 0 0 0 0    Fall Risk   Row Labels 05/05/2022    1:52 PM 06/30/2021   11:30 AM 05/30/2021    8:08 AM 03/31/2021   10:10 AM 01/27/2021    1:48 PM  Fall Risk    Section Header. No data exists in this row.       Falls in the past year?   0 0 0 0 0   Number falls in past yr:   0 0 0 0 0  Injury with Fall?   0 0 0 0 0  Follow up    Falls evaluation completed Falls evaluation completed Falls evaluation completed Falls evaluation completed    FALL RISK PREVENTION PERTAINING TO THE HOME:  Any stairs in or around the home? No  If so, are there any without handrails? No  Home free of loose throw rugs in walkways, pet beds, electrical cords, etc? No  Adequate lighting in your home to reduce risk of falls? Yes   ASSISTIVE DEVICES UTILIZED TO PREVENT FALLS:  Life alert? No  Use of a cane, walker or w/c? Yes  Grab bars in the bathroom? Yes  Shower chair or bench in shower? No  Elevated toilet seat or a handicapped toilet? Yes   TIMED UP AND GO:  Was the test performed? Yes .  Length of time to ambulate 10 feet: 10-15 sec.   Gait slow and steady without use of assistive device  Cognitive Function:   Row Labels 02/13/2020    2:16 PM 02/09/2019    3:07 PM 01/24/2018   11:27 AM  MMSE - Mini Mental State Exam   Section Header. No data exists in this row.     Orientation to time   '5 5 5  '$ Orientation to Place   '5 5 5  '$ Registration   '3 3 3  '$ Attention/ Calculation   '5 5 5  '$ Recall   '3 3 3  '$ Language- name 2 objects   '2 2 2  '$ Language- repeat   '1 1 1  '$ Language- follow 3 step command   '3 3 3  '$ Language- read & follow direction   '1 1 1  '$ Write a sentence   '1 1 1  '$ Copy design   '1 1 1  '$ Total score   '30 30 30       '$ Row Labels 05/05/2022    1:42 PM 03/31/2021   10:11 AM  6CIT Screen   Section Header.  No data exists in this row.    What Year?   0 points 0 points  What month?   0 points 0 points  What time?   0 points 0 points  Count back from 20   0 points 0 points  Months in reverse   0 points 0 points  Repeat phrase   0 points 0 points  Total Score   0 points 0 points    Immunizations Immunization History  Administered Date(s) Administered   Fluad Quad(high Dose 65+) 03/31/2021, 05/05/2022   Influenza Inj Mdck Quad Pf  03/18/2018, 04/14/2019, 04/16/2020   Influenza-Unspecified 05/04/2017   Moderna Sars-Covid-2 Vaccination 08/09/2019, 09/06/2019, 07/25/2020   Pneumococcal Conjugate-13 03/01/2018   Pneumococcal Polysaccharide-23 05/31/2015   Zoster Recombinat (Shingrix) 01/25/2018, 04/18/2018, 03/31/2019    TDAP status: Up to date  Flu Vaccine status: Completed at today's visit  Pneumococcal vaccine status: Up to date  Covid-19 vaccine status: Information provided on how to obtain vaccines.   Qualifies for Shingles Vaccine? Yes   Zostavax completed Yes   Shingrix Completed?: Yes  Screening Tests Health Maintenance  Topic Date Due   COVID-19 Vaccine (4 - 2023-24 season) 02/13/2022   Medicare Annual Wellness (AWV)  05/06/2023   Pneumonia Vaccine 29+ Years old  Completed   INFLUENZA VACCINE  Completed   DEXA SCAN  Completed   Zoster Vaccines- Shingrix  Completed   HPV VACCINES  Aged Out    Health Maintenance  Health Maintenance Due  Topic Date Due   COVID-19 Vaccine (4 - 2023-24 season) 02/13/2022    Colorectal cancer screening: Type of screening: Colonoscopy. Completed 12/04/2014. Repeat every 10 years  Mammogram status: Completed 03/12/2022. Repeat every year  Bone Density status: Completed 07/03/2015. Results reflect: Bone density results: OSTEOPENIA. Repeat every n/a years.  Lung Cancer Screening: (Low Dose CT Chest recommended if Age 21-80 years, 30 pack-year currently smoking OR have quit w/in 15years.) does not qualify.   Lung Cancer Screening Referral: n/a  Additional Screening:  Hepatitis C Screening: does not qualify; Completed n/a  Vision Screening: Recommended annual ophthalmology exams for early detection of glaucoma and other disorders of the eye. Is the patient up to date with their annual eye exam?  Yes    Dental Screening: Recommended annual dental exams for proper oral hygiene  Community Resource Referral / Chronic Care Management: CRR required this visit?  No    CCM required this visit?  No      Plan:     I have personally reviewed and noted the following in the patient's chart:   Medical and social history Use of alcohol, tobacco or illicit drugs  Current medications and supplements including opioid prescriptions. Patient is currently taking opioid prescriptions. Information provided to patient regarding non-opioid alternatives. Patient advised to discuss non-opioid treatment plan with their provider. Functional ability and status Nutritional status Physical activity Advanced directives List of other physicians Hospitalizations, surgeries, and ER visits in previous 12 months Vitals Screenings to include cognitive, depression, and falls Referrals and appointments  In addition, I have reviewed and discussed with patient certain preventive protocols, quality metrics, and best practice recommendations. A written personalized care plan for preventive services as well as general preventive health recommendations were provided to patient.     Ronnell Freshwater, NP   05/05/2022   Nurse Notes: face to face 20 min

## 2022-05-14 ENCOUNTER — Other Ambulatory Visit: Payer: Self-pay | Admitting: Nurse Practitioner

## 2022-05-14 DIAGNOSIS — E039 Hypothyroidism, unspecified: Secondary | ICD-10-CM

## 2022-05-21 DIAGNOSIS — H353211 Exudative age-related macular degeneration, right eye, with active choroidal neovascularization: Secondary | ICD-10-CM | POA: Diagnosis not present

## 2022-05-26 ENCOUNTER — Telehealth: Payer: Self-pay | Admitting: *Deleted

## 2022-05-26 ENCOUNTER — Ambulatory Visit (INDEPENDENT_AMBULATORY_CARE_PROVIDER_SITE_OTHER): Payer: Medicare HMO | Admitting: Vascular Surgery

## 2022-05-26 ENCOUNTER — Encounter (INDEPENDENT_AMBULATORY_CARE_PROVIDER_SITE_OTHER): Payer: Medicare HMO

## 2022-05-26 NOTE — Telephone Encounter (Signed)
LVM for pt to call office to inquire about message she left wanting something to be sent in to [facility] at [facility] for sinus infection, drainage, sore throat and runny nose. Called to see about getting her scheduled to be evaluated for this. Lira Stephen Zimmerman Rumple, CMA

## 2022-06-04 ENCOUNTER — Ambulatory Visit: Payer: Medicare HMO | Admitting: Nurse Practitioner

## 2022-06-09 ENCOUNTER — Encounter (INDEPENDENT_AMBULATORY_CARE_PROVIDER_SITE_OTHER): Payer: Medicare HMO

## 2022-06-09 ENCOUNTER — Ambulatory Visit (INDEPENDENT_AMBULATORY_CARE_PROVIDER_SITE_OTHER): Payer: Medicare HMO | Admitting: Vascular Surgery

## 2022-06-25 ENCOUNTER — Other Ambulatory Visit (INDEPENDENT_AMBULATORY_CARE_PROVIDER_SITE_OTHER): Payer: Self-pay | Admitting: Vascular Surgery

## 2022-06-25 DIAGNOSIS — I771 Stricture of artery: Secondary | ICD-10-CM

## 2022-07-03 ENCOUNTER — Ambulatory Visit (INDEPENDENT_AMBULATORY_CARE_PROVIDER_SITE_OTHER): Payer: Medicare HMO | Admitting: Vascular Surgery

## 2022-07-03 ENCOUNTER — Encounter (INDEPENDENT_AMBULATORY_CARE_PROVIDER_SITE_OTHER): Payer: Medicare HMO

## 2022-07-05 NOTE — Progress Notes (Signed)
Established patient visit   Patient: Martha Peterson   DOB: 1941-02-04   82 y.o. Female  MRN: [id number] Visit Date: 07/06/2022   Chief Complaint  Patient presents with   Follow-up   Hypertension   Subjective    HPI  Follow up  -Doing well with current prescriptions for pain management.  She is now taking oxycodone ER 18 mg twice daily.  Oxycodone 10 mg daily if needed for breakthrough pain.  Indication for chronic opioid: chronic pain and inflammatory polyarthritis. Patient unable to take NSAIDs due to treatment with blood thinners. 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  Medication and dose: Oxycodone XR 18 mg twice daily and oxycodone '10mg'$  QD prn for breakthrough pain  # pills per month: 60 oxycodone XR and 30 oxycodone '10mg'$  Last UDS date: 03/31/2021 Opioid Treatment Agreement signed (Y/N): yes Opioid Treatment Agreement last reviewed with patient:  03/31/2021 [facility] reviewed this encounter (include red flags): no  PDMP profile reviewed 07/05/2022-  -overdose risk score is below average at 030 with no red flags  or state indicators  -last fill of Oxycodone ER '18mg'$  was 06/04/2022 -last fill Oxycodone '10mg'$  06/04/2022   Medications: Outpatient Medications Prior to Visit  Medication Sig   allopurinol (ZYLOPRIM) 100 MG tablet Take 2 tablets (200 mg total) by mouth daily.   apixaban (ELIQUIS) 5 MG TABS tablet Take 1 tablet (5 mg total) by mouth 2 (two) times daily.   ascorbic acid (VITAMIN C) 500 MG tablet Take 500 mg by mouth daily.   aspirin EC 81 MG tablet Take 81 mg by mouth daily.   atorvastatin (LIPITOR) 10 MG tablet TAKE 1 TABLET EVERY DAY AT 6PM   carvedilol (COREG) 6.25 MG tablet TAKE 1 TABLET TWICE DAILY WITH MEALS   cholecalciferol (VITAMIN D) 400 UNITS TABS tablet Take 1,000 Units by mouth.   diclofenac sodium (VOLTAREN) 1 % GEL Apply 3 grams to 3 large joints, up to 3 times daily as needed.   fluticasone (FLONASE) 50 MCG/ACT  nasal spray Place 2 sprays into both nostrils daily.   Ginger, Zingiber officinalis, (GINGER PO) Take by mouth.   IBU 600 MG tablet TAKE 1 TABLET EVERY 8 HOURS AS NEEDED   levocetirizine (XYZAL) 5 MG tablet TAKE 1 TABLET EVERY EVENING   levothyroxine (SYNTHROID) 137 MCG tablet TAKE 1 TABLET EVERY DAY BEFORE BREAKFAST   losartan-hydrochlorothiazide (HYZAAR) 100-25 MG tablet Take 1 tablet by mouth daily.   magnesium oxide (MAG-OX) 400 MG tablet Take 400 mg by mouth daily.    Misc Natural Products (TART CHERRY ADVANCED PO) Take by mouth daily.   Multiple Vitamins-Minerals (PRESERVISION AREDS 2 PO) Take by mouth 2 (two) times daily.   omega-3 acid ethyl esters (LOVAZA) 1 G capsule Take 1 g by mouth 2 (two) times daily.   torsemide 40 MG TABS Take 40 mg by mouth daily.   TURMERIC PO Take by mouth daily.   vitamin E 400 UNIT capsule Take 800 Units by mouth daily.    [DISCONTINUED] mirtazapine (REMERON) 15 MG tablet Take 1.5 tablets QPM   [DISCONTINUED] oxyCODONE ER 18 MG C12A Take 18 mg by mouth 2 (two) times daily.   [DISCONTINUED] Oxycodone HCl 10 MG TABS Take 1 tablet (10 mg total) by mouth daily as needed.   [DISCONTINUED] potassium chloride 20 MEQ TBCR Take 20 mEq by mouth daily.   [DISCONTINUED] predniSONE (DELTASONE) 5 MG tablet TAKE 2 TABLETS EVERY DAY WITH BREAKFAST   [DISCONTINUED] tiZANidine (ZANAFLEX)  4 MG tablet TAKE 1 TABLET TWICE DAILY   No facility-administered medications prior to visit.    Review of Systems  Constitutional:  Negative for activity change, appetite change, chills, fatigue and fever.  HENT:  Negative for congestion, postnasal drip, rhinorrhea, sinus pressure, sinus pain, sneezing and sore throat.   Eyes: Negative.   Respiratory:  Negative for cough, chest tightness, shortness of breath and wheezing.   Cardiovascular:  Positive for leg swelling. Negative for chest pain and palpitations.       Chronic a-fib  Gastrointestinal:  Negative for abdominal pain,  constipation, diarrhea, nausea and vomiting.  Endocrine: Negative for cold intolerance, heat intolerance, polydipsia and polyuria.       Well managed hypothyroid   Genitourinary:  Negative for dyspareunia, dysuria, flank pain, frequency and urgency.  Musculoskeletal:  Positive for gait problem. Negative for arthralgias, back pain and myalgias.       Back pain and generalized joint pain well managed with current medication. She has tolerated this well and has no negative side effects.     Skin:  Negative for rash.  Allergic/Immunologic: Negative for environmental allergies.  Neurological:  Negative for dizziness, weakness and headaches.  Hematological:  Negative for adenopathy.  Psychiatric/Behavioral:  The patient is not nervous/anxious.     Last CBC Lab Results  Component Value Date   WBC 3.8 03/31/2021   HGB 11.9 03/31/2021   HCT 35.4 03/31/2021   MCV 97 03/31/2021   MCH 32.4 03/31/2021   RDW 11.8 03/31/2021   PLT 126 (L) [id number]   Last metabolic panel Lab Results  Component Value Date   GLUCOSE 96 03/18/2022   NA 138 03/18/2022   K 4.1 03/18/2022   CL 94 (L) 03/18/2022   CO2 28 03/18/2022   BUN 35 (H) 03/18/2022   CREATININE 1.33 (H) 03/18/2022   EGFR 40 (L) 03/18/2022   CALCIUM 9.1 03/18/2022   PROT 7.1 10/28/2021   ALBUMIN 4.0 10/28/2021   LABGLOB 3.1 10/28/2021   AGRATIO 1.3 10/28/2021   BILITOT 0.5 10/28/2021   ALKPHOS 113 10/28/2021   AST 35 10/28/2021   ALT 17 10/28/2021   ANIONGAP 12 10/16/2019   Last lipids Lab Results  Component Value Date   CHOL 101 03/31/2021   HDL 41 03/31/2021   LDLCALC 40 03/31/2021   TRIG 104 03/31/2021   CHOLHDL 2.5 03/31/2021   Last hemoglobin A1c Lab Results  Component Value Date   HGBA1C 5.9 (H) 03/31/2021   Last thyroid functions Lab Results  Component Value Date   TSH 1.600 03/31/2021   Last vitamin D Lab Results  Component Value Date   VD25OH 37.1 03/31/2021       Objective     Today's Vitals    07/06/22 1350  BP: 127/77  Pulse: 93  Resp: 18  SpO2: 95%  Weight: 171 lb (77.6 kg)  Height: '5\' 2"'$  (1.575 m)   Body mass index is 31.28 kg/m.  BP Readings from Last 3 Encounters:  07/06/22 127/77  05/05/22 124/75  05/01/22 128/84    Wt Readings from Last 3 Encounters:  07/06/22 171 lb (77.6 kg)  05/05/22 175 lb (79.4 kg)  05/01/22 167 lb (75.8 kg)    Physical Exam Vitals and nursing note reviewed.  Constitutional:      Appearance: Normal appearance. She is well-developed.  HENT:     Head: Normocephalic and atraumatic.     Nose: Nose normal.     Mouth/Throat:     Mouth: Mucous membranes  are moist.     Pharynx: Oropharynx is clear.  Eyes:     Extraocular Movements: Extraocular movements intact.     Conjunctiva/sclera: Conjunctivae normal.     Pupils: Pupils are equal, round, and reactive to light.  Neck:     Comments: Mild JVD present  Cardiovascular:     Rate and Rhythm: Normal rate. Rhythm irregular.     Pulses: Normal pulses.     Heart sounds: Murmur heard.     Comments: Irregular heart rhythm.  Pulmonary:     Effort: Pulmonary effort is normal.     Breath sounds: Normal breath sounds.  Abdominal:     General: Bowel sounds are normal. There is no distension.     Palpations: Abdomen is soft. There is no mass.     Tenderness: There is no abdominal tenderness. There is no right CVA tenderness, left CVA tenderness, guarding or rebound.     Hernia: No hernia is present.  Musculoskeletal:        General: Normal range of motion.     Cervical back: Normal range of motion and neck supple.     Left lower leg: Edema present.     Comments: Left foot deformity with swelling present and unchanged. ROM and strength of the left foot is diminished but at baseline.  The patient is using a walking cane to help her with mobility and balance.  Chronic low back pain making it difficult to walk in erect position. Bending at the waist and stooping to reach for things often increases  pain. Leaning heavier on walking cane.    Lymphadenopathy:     Cervical: No cervical adenopathy.  Skin:    General: Skin is warm and dry.     Capillary Refill: Capillary refill takes less than 2 seconds.  Neurological:     General: No focal deficit present.     Mental Status: She is alert and oriented to person, place, and time.  Psychiatric:        Mood and Affect: Mood normal.        Behavior: Behavior normal.        Thought Content: Thought content normal.        Judgment: Judgment normal.      Assessment & Plan    1. Essential hypertension Stable. Continue bp medication as prescribed   2. Chronic atrial fibrillation ([place]) She should continue regular visits with cardiology as scheduled   3. Carotid artery 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, bilateral Continues to see vein and vascular   4. Inflammatory polyarthritis (HCC) Conitnue oxycodone ER 18 mg twice daily. Two 30 day prescriptions were sent to her pharmacy. Dates are 07/06/2022 and 08/04/2022.  - oxyCODONE ER 18 MG C12A; Take 18 mg by mouth 2 (two) times daily.  Dispense: 60 capsule; Refill: 0  5. Other osteoarthritis involving multiple joints Ok to continue oxycodone 10 mg daily as needed for breakthrough pain. Two 30 day prescriptions were sent to her pharmacy. Dates are 07/06/2022 and 08/04/2022.  - Oxycodone HCl 10 MG TABS; Take 1 tablet (10 mg total) by mouth daily as needed.  Dispense: 30 tablet; Refill: 0  6. [name] (nonalcoholic steatohepatitis) Reviewed most recent abdominal ultrasound which is stable. She does have coarse heterogenous liver and has normal LFTs. Will continue to monitor.     Problem List Items Addressed This Visit       Cardiovascular and Mediastinum   Essential hypertension - Primary   Carotid artery 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,  bilateral   Chronic atrial fibrillation (HCC)     Digestive   [name] (nonalcoholic steatohepatitis)     Musculoskeletal and Integument   Inflammatory  polyarthritis (HCC)   Relevant Medications   oxyCODONE ER 18 MG C12A   Oxycodone HCl 10 MG TABS   Degenerative joint disease involving multiple joints   Relevant Medications   oxyCODONE ER 18 MG C12A   Oxycodone HCl 10 MG TABS     Return in about 2 months (around 09/04/2022) for blood pressure, med refills, FBW at time of visit - please make AM appointment .         Ronnell Freshwater, NP  [facility] [phone] (phone) [phone] (fax)  [place]

## 2022-07-06 ENCOUNTER — Encounter: Payer: Self-pay | Admitting: Nurse Practitioner

## 2022-07-06 ENCOUNTER — Ambulatory Visit (INDEPENDENT_AMBULATORY_CARE_PROVIDER_SITE_OTHER): Payer: Medicare HMO | Admitting: Nurse Practitioner

## 2022-07-06 VITALS — BP 127/77 | HR 93 | Resp 18 | Ht 62.0 in | Wt 171.0 lb

## 2022-07-06 DIAGNOSIS — I10 Essential (primary) hypertension: Secondary | ICD-10-CM | POA: Diagnosis not present

## 2022-07-06 DIAGNOSIS — K7581 Nonalcoholic steatohepatitis (NASH): Secondary | ICD-10-CM | POA: Diagnosis not present

## 2022-07-06 DIAGNOSIS — M158 Other polyosteoarthritis: Secondary | ICD-10-CM | POA: Diagnosis not present

## 2022-07-06 DIAGNOSIS — I6523 Occlusion and stenosis of bilateral carotid arteries: Secondary | ICD-10-CM | POA: Diagnosis not present

## 2022-07-06 DIAGNOSIS — I482 Chronic atrial fibrillation, unspecified: Secondary | ICD-10-CM

## 2022-07-06 DIAGNOSIS — M064 Inflammatory polyarthropathy: Secondary | ICD-10-CM | POA: Diagnosis not present

## 2022-07-06 MED ORDER — OXYCODONE HCL 10 MG PO TABS: 10.0000 mg | ORAL_TABLET | Freq: Every day | ORAL | 0 refills | Status: DC | PRN

## 2022-07-06 MED ORDER — OXYCODONE ER 18 MG PO C12A: 18.0000 mg | EXTENDED_RELEASE_CAPSULE | Freq: Two times a day (BID) | ORAL | 0 refills | Status: DC

## 2022-07-17 ENCOUNTER — Other Ambulatory Visit: Payer: Self-pay | Admitting: Nurse Practitioner

## 2022-07-17 DIAGNOSIS — M791 Myalgia, unspecified site: Secondary | ICD-10-CM

## 2022-07-17 DIAGNOSIS — H353211 Exudative age-related macular degeneration, right eye, with active choroidal neovascularization: Secondary | ICD-10-CM | POA: Diagnosis not present

## 2022-07-29 ENCOUNTER — Other Ambulatory Visit: Payer: Self-pay | Admitting: Nurse Practitioner

## 2022-07-29 DIAGNOSIS — M0579 Rheumatoid arthritis with rheumatoid factor of multiple sites without organ or systems involvement: Secondary | ICD-10-CM

## 2022-07-29 DIAGNOSIS — F321 Major depressive disorder, single episode, moderate: Secondary | ICD-10-CM

## 2022-07-29 DIAGNOSIS — H353211 Exudative age-related macular degeneration, right eye, with active choroidal neovascularization: Secondary | ICD-10-CM | POA: Diagnosis not present

## 2022-07-29 DIAGNOSIS — H353221 Exudative age-related macular degeneration, left eye, with active choroidal neovascularization: Secondary | ICD-10-CM | POA: Diagnosis not present

## 2022-07-30 ENCOUNTER — Other Ambulatory Visit: Payer: Self-pay

## 2022-07-30 DIAGNOSIS — E876 Hypokalemia: Secondary | ICD-10-CM

## 2022-07-30 MED ORDER — POTASSIUM CHLORIDE ER 20 MEQ PO TBCR: 20.0000 meq | EXTENDED_RELEASE_TABLET | Freq: Every day | ORAL | 1 refills | Status: DC

## 2022-08-18 ENCOUNTER — Encounter (INDEPENDENT_AMBULATORY_CARE_PROVIDER_SITE_OTHER): Payer: Self-pay | Admitting: Vascular Surgery

## 2022-08-18 ENCOUNTER — Ambulatory Visit (INDEPENDENT_AMBULATORY_CARE_PROVIDER_SITE_OTHER): Payer: Medicare HMO | Admitting: Nurse Practitioner

## 2022-08-18 ENCOUNTER — Ambulatory Visit (INDEPENDENT_AMBULATORY_CARE_PROVIDER_SITE_OTHER): Payer: Medicare HMO

## 2022-08-18 VITALS — BP 127/27 | HR 102 | Resp 18 | Ht 63.0 in | Wt 175.0 lb

## 2022-08-18 DIAGNOSIS — J449 Chronic obstructive pulmonary disease, unspecified: Secondary | ICD-10-CM | POA: Diagnosis not present

## 2022-08-18 DIAGNOSIS — I10 Essential (primary) hypertension: Secondary | ICD-10-CM

## 2022-08-18 DIAGNOSIS — J8410 Pulmonary fibrosis, unspecified: Secondary | ICD-10-CM | POA: Diagnosis not present

## 2022-08-18 DIAGNOSIS — E782 Mixed hyperlipidemia: Secondary | ICD-10-CM

## 2022-08-18 DIAGNOSIS — I771 Stricture of artery: Secondary | ICD-10-CM | POA: Diagnosis not present

## 2022-08-19 ENCOUNTER — Encounter (INDEPENDENT_AMBULATORY_CARE_PROVIDER_SITE_OTHER): Payer: Self-pay | Admitting: Nurse Practitioner

## 2022-08-19 NOTE — Progress Notes (Signed)
Subjective:    Patient ID: Martha Peterson, female    DOB: 12/03/1940, 82 y.o.   MRN: [id number] Chief Complaint  Patient presents with   Follow-up    1 year f/u with carotid    Patient returns in follow-up.  The patient is about 3 years status post left right carotid to carotid bypass.  She has done well and she currently has no focal neurological deficits.  She continues to have some right arm claudication symptoms and some associated numbness but she notes that the numbness is occasional and not severely lifestyle limiting to her currently.  She denies any neck pain or other issues.  The patient's duplex today notes a 1 to 39% stenosis of the right ICA with a 40 to 59% stenosis of the left ICA.  Velocities are only slightly increased from the previous studies in the left ICA.    Review of Systems  Neurological:  Positive for numbness.  All other systems reviewed and are negative.      Objective:   Physical Exam Vitals reviewed.  HENT:     Head: Normocephalic.  Cardiovascular:     Rate and Rhythm: Normal rate.     Pulses:          Radial pulses are 1+ on the right side and 2+ on the left side.  Pulmonary:     Effort: Pulmonary effort is normal.  Skin:    General: Skin is warm and dry.  Neurological:     Mental Status: She is alert and oriented to person, place, and time.  Psychiatric:        Mood and Affect: Mood normal.        Behavior: Behavior normal.        Thought Content: Thought content normal.        Judgment: Judgment normal.     BP (!) 127/27 (BP Location: Left Arm)   Pulse (!) 102   Resp 18   Ht '5\' 3"'$  (1.6 m)   Wt 175 lb (79.4 kg)   BMI 31.00 kg/m   Past Medical History:  Diagnosis Date   Arthritis    Asthma    Atrial fibrillation (HCC)    per patient's daughter    COPD (chronic obstructive pulmonary disease) (HCC)    Diverticulitis    Gout    Hyperlipidemia    Hypertension    Hypothyroidism    MGUS (monoclonal gammopathy of unknown  significance) 08/20/2015   Microhematuria    Mild mitral regurgitation    Mild tricuspid regurgitation    Osteopenia    Pulmonary fibrosis (HCC)    Urinary incontinence     Social History   Socioeconomic History   Marital status: Widowed    Spouse name: Jeneen Rinks   Number of children: Not on file   Years of education: Not on file   Highest education level: Not on file  Occupational History   Not on file  Tobacco Use   Smoking status: Former    Years: 1.00    Types: Cigarettes    Passive exposure: Never   Smokeless tobacco: Never  Vaping Use   Vaping Use: Never used  Substance and Sexual Activity   Alcohol use: No   Drug use: No   Sexual activity: Not Currently  Other Topics Concern   Not on file  Social History Narrative   Lives at home with husband in private residence   Social Determinants of Health   Financial Resource Strain: Not  on file  Food Insecurity: Not on file  Transportation Needs: Not on file  Physical Activity: Not on file  Stress: Not on file  Social Connections: Not on file  Intimate Partner Violence: Not on file    Past Surgical History:  Procedure Laterality Date   ABDOMINAL HYSTERECTOMY     ANKLE SURGERY Left    APPENDECTOMY     CAROTID PTA/STENT INTERVENTION N/A 07/17/2019   Procedure: CAROTID PTA/STENT INTERVENTION;  Surgeon: Algernon Huxley, MD;  Location: [facility];  Service: Cardiovascular;  Laterality: N/A;   CAROTID-SUBCLAVIAN BYPASS GRAFT N/A 07/26/2019   Procedure: BYPASS GRAFT CAROTID-SUBCLAVIAN ( CAROTID TO CAROTID BYPASS);  Surgeon: Algernon Huxley, MD;  Location: [facility];  Service: Vascular;  Laterality: N/A;   CHOLECYSTECTOMY     COLONOSCOPY WITH PROPOFOL N/A 12/04/2014   Procedure: COLONOSCOPY WITH PROPOFOL;  Surgeon: Lollie Sails, MD;  Location: [facility];  Service: Endoscopy;  Laterality: N/A;   FRACTURE SURGERY     Femur Fx; LT Ankle Pinning   JOINT REPLACEMENT     RT TKR   Patial Thyroid Resection       Family History  Problem Relation Age of Onset   Heart Problems Mother    Heart Problems Father    Diabetes Father    Lupus Sister    Thyroid disease Sister    Heart Problems Sister    Hypertension Son    Diabetes Son        borderline    Arthritis Daughter        in the left knee    Diabetes Daughter    Neuropathy Daughter    Depression Daughter    Anxiety disorder Daughter     Allergies  Allergen Reactions   Latex Hives, Itching and Rash   Codeine    Levaquin [Levofloxacin]    Tape     latex       Latest Ref Rng & Units 03/31/2021   11:09 AM 10/16/2020   11:06 AM 02/06/2020   12:21 PM  CBC  WBC 3.4 - 10.8 x10E3/uL 3.8  4.2  3.7   Hemoglobin 11.1 - 15.9 g/dL 11.9  12.3  12.5   Hematocrit 34.0 - 46.6 % 35.4  37.5  35.9   Platelets 150 - 450 x10E3/uL 126  121  158       CMP     Component Value Date/Time   NA 138 03/18/2022 1152   NA 135 (L) 02/02/2013 1038   K 4.1 03/18/2022 1152   K 3.8 02/02/2013 1038   CL 94 (L) 03/18/2022 1152   CL 102 02/02/2013 1038   CO2 28 03/18/2022 1152   CO2 29 02/02/2013 1038   GLUCOSE 96 03/18/2022 1152   GLUCOSE 106 (H) 10/16/2019 1102   GLUCOSE 111 (H) 02/02/2013 1038   BUN 35 (H) 03/18/2022 1152   BUN 15 02/02/2013 1038   CREATININE 1.33 (H) 03/18/2022 1152   CREATININE 0.99 02/20/2014 1019   CALCIUM 9.1 03/18/2022 1152   CALCIUM 8.9 02/20/2014 1019   PROT 7.1 10/28/2021 1607   PROT 8.3 (H) 08/05/2011 1020   ALBUMIN 4.0 10/28/2021 1607   ALBUMIN 3.6 08/05/2011 1020   AST 35 10/28/2021 1607   AST 75 (H) 08/05/2011 1020   ALT 17 10/28/2021 1607   ALT 90 (H) 08/05/2011 1020   ALKPHOS 113 10/28/2021 1607   ALKPHOS 141 (H) 08/05/2011 1020   BILITOT 0.5 10/28/2021 1607   BILITOT 0.7 08/05/2011 1020  GFRNONAA 44 (L) 07/10/2020 1148   GFRNONAA 56 (L) 02/20/2014 1019   GFRAA 50 (L) 07/10/2020 1148   GFRAA >60 02/20/2014 1019     No results found.     Assessment & Plan:   1. Innominate artery stenosis  (HCC) Widely patent bypass on duplex.  Continue current medical regimen.  Recheck in 12 months.  Does have some right arm claudication type symptoms and we discussed that we could try to get across this percutaneously into a stent now that her mid common carotid artery is ligated we would not have to worry about embolization distally.  She says that her symptoms are not severe enough to warrant that at this point but we will keep an eye on this.   2. Essential hypertension Continue antihypertensive medications as already ordered, these medications have been reviewed and there are no changes at this time.  3. Hyperlipidemia, mixed Continue statin as ordered and reviewed, no changes at this time   Current Outpatient Medications on File Prior to Visit  Medication Sig Dispense Refill   allopurinol (ZYLOPRIM) 100 MG tablet Take 2 tablets (200 mg total) by mouth daily. 180 tablet 1   apixaban (ELIQUIS) 5 MG TABS tablet Take 1 tablet (5 mg total) by mouth 2 (two) times daily. 180 tablet 3   ascorbic acid (VITAMIN C) 500 MG tablet Take 500 mg by mouth daily.     aspirin EC 81 MG tablet Take 81 mg by mouth daily.     atorvastatin (LIPITOR) 10 MG tablet TAKE 1 TABLET EVERY DAY AT 6PM 90 tablet 10   carvedilol (COREG) 6.25 MG tablet TAKE 1 TABLET TWICE DAILY WITH MEALS 180 tablet 1   cholecalciferol (VITAMIN D) 400 UNITS TABS tablet Take 1,000 Units by mouth.     diclofenac sodium (VOLTAREN) 1 % GEL Apply 3 grams to 3 large joints, up to 3 times daily as needed. 3 Tube 3   fluticasone (FLONASE) 50 MCG/ACT nasal spray Place 2 sprays into both nostrils daily. 16 g 6   Ginger, Zingiber officinalis, (GINGER PO) Take by mouth.     IBU 600 MG tablet TAKE 1 TABLET EVERY 8 HOURS AS NEEDED 110 tablet 10   levocetirizine (XYZAL) 5 MG tablet TAKE 1 TABLET EVERY EVENING 90 tablet 1   levothyroxine (SYNTHROID) 137 MCG tablet TAKE 1 TABLET EVERY DAY BEFORE BREAKFAST 90 tablet 3   losartan-hydrochlorothiazide  (HYZAAR) 100-25 MG tablet Take 1 tablet by mouth daily. 90 tablet 1   magnesium oxide (MAG-OX) 400 MG tablet Take 400 mg by mouth daily.      mirtazapine (REMERON) 15 MG tablet TAKE 1 AND 1/2 TABLETS EVERY EVENING 135 tablet 3   Misc Natural Products (TART CHERRY ADVANCED PO) Take by mouth daily.     Multiple Vitamins-Minerals (PRESERVISION AREDS 2 PO) Take by mouth 2 (two) times daily.     omega-3 acid ethyl esters (LOVAZA) 1 G capsule Take 1 g by mouth 2 (two) times daily.     oxyCODONE ER 18 MG C12A Take 18 mg by mouth 2 (two) times daily. 60 capsule 0   Oxycodone HCl 10 MG TABS Take 1 tablet (10 mg total) by mouth daily as needed. 30 tablet 0   Potassium Chloride ER 20 MEQ TBCR Take 1 tablet (20 mEq total) by mouth daily. 90 tablet 1   potassium chloride SA (KLOR-CON M) 20 MEQ tablet Take 20 mEq by mouth daily.     predniSONE (DELTASONE) 5 MG tablet TAKE  2 TABLETS EVERY DAY WITH BREAKFAST 180 tablet 3   tiZANidine (ZANAFLEX) 4 MG tablet TAKE 1 TABLET TWICE DAILY 180 tablet 3   torsemide 40 MG TABS Take 40 mg by mouth daily. 90 tablet 1   TURMERIC PO Take by mouth daily.     vitamin E 400 UNIT capsule Take 800 Units by mouth daily.      No current facility-administered medications on file prior to visit.    There are no Patient Instructions on file for this visit. No follow-ups on file.   Kris Hartmann, NP

## 2022-08-22 ENCOUNTER — Other Ambulatory Visit: Payer: Self-pay | Admitting: Nurse Practitioner

## 2022-09-03 DIAGNOSIS — H353221 Exudative age-related macular degeneration, left eye, with active choroidal neovascularization: Secondary | ICD-10-CM | POA: Diagnosis not present

## 2022-09-03 DIAGNOSIS — H353211 Exudative age-related macular degeneration, right eye, with active choroidal neovascularization: Secondary | ICD-10-CM | POA: Diagnosis not present

## 2022-09-10 ENCOUNTER — Encounter: Payer: Self-pay | Admitting: Nurse Practitioner

## 2022-09-10 ENCOUNTER — Ambulatory Visit (INDEPENDENT_AMBULATORY_CARE_PROVIDER_SITE_OTHER): Payer: Medicare HMO | Admitting: Nurse Practitioner

## 2022-09-10 VITALS — BP 127/73 | HR 119 | Ht 63.0 in | Wt 173.8 lb

## 2022-09-10 DIAGNOSIS — M064 Inflammatory polyarthropathy: Secondary | ICD-10-CM | POA: Diagnosis not present

## 2022-09-10 DIAGNOSIS — M158 Other polyosteoarthritis: Secondary | ICD-10-CM | POA: Diagnosis not present

## 2022-09-10 DIAGNOSIS — I482 Chronic atrial fibrillation, unspecified: Secondary | ICD-10-CM | POA: Diagnosis not present

## 2022-09-10 DIAGNOSIS — N289 Disorder of kidney and ureter, unspecified: Secondary | ICD-10-CM | POA: Diagnosis not present

## 2022-09-10 DIAGNOSIS — E559 Vitamin D deficiency, unspecified: Secondary | ICD-10-CM | POA: Diagnosis not present

## 2022-09-10 DIAGNOSIS — E782 Mixed hyperlipidemia: Secondary | ICD-10-CM

## 2022-09-10 DIAGNOSIS — K7581 Nonalcoholic steatohepatitis (NASH): Secondary | ICD-10-CM

## 2022-09-10 DIAGNOSIS — E039 Hypothyroidism, unspecified: Secondary | ICD-10-CM

## 2022-09-10 DIAGNOSIS — R7309 Other abnormal glucose: Secondary | ICD-10-CM | POA: Diagnosis not present

## 2022-09-10 MED ORDER — OXYCODONE ER 18 MG PO C12A: 18.0000 mg | EXTENDED_RELEASE_CAPSULE | Freq: Two times a day (BID) | ORAL | 0 refills | Status: DC

## 2022-09-10 MED ORDER — OXYCODONE HCL 10 MG PO TABS: 10.0000 mg | ORAL_TABLET | Freq: Every day | ORAL | 0 refills | Status: DC | PRN

## 2022-09-10 NOTE — Progress Notes (Signed)
Established patient visit   Patient: Martha Peterson   DOB: 02-18-1941   82 y.o. Female  MRN: [id number] Visit Date: 09/10/2022   Chief Complaint  Patient presents with   Medical Management of Chronic Issues   Subjective    HPI  Follow up  -due to have check routine, fasting labs during today's visit.  -Doing well with current prescriptions for pain management.  She is now taking oxycodone ER 18 mg twice daily.  Oxycodone 10 mg daily if needed for breakthrough pain.  Indication for chronic opioid: chronic pain and inflammatory polyarthritis. Patient unable to take NSAIDs due to treatment with blood thinners. 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  Medication and dose: Oxycodone XR 18 mg twice daily and oxycodone 10mg  QD prn for breakthrough pain  # pills per month: 60 oxycodone XR and 30 oxycodone 10mg  Last UDS date: 03/31/2021 Opioid Treatment Agreement signed (Y/N): yes Opioid Treatment Agreement last reviewed with patient:  03/31/2021 [facility] reviewed this encounter (include red flags): no  PDMP profile reviewed 09/09/2022 -overdose risk score is below average at 030 with no red flags  or state indicators  -last fill of Oxycodone ER 18mg  was 08/07/2022 -last fill Oxycodone 10mg  08/07/2022  States that she has started getting injections into her right eye for macular degeneration. Does not get injections in both eyes on the same day. Separates injections by 1 to 2 weeks.   -She denies chest pain, chest pressure, or shortness of breath. She denies headaches or visual disturbances. She denies abdominal pain, nausea, vomiting, or changes in bowel or bladder habits.    Medications: Outpatient Medications Prior to Visit  Medication Sig   apixaban (ELIQUIS) 5 MG TABS tablet Take 1 tablet (5 mg total) by mouth 2 (two) times daily.   ascorbic acid (VITAMIN C) 500 MG tablet Take 500 mg by mouth daily.   aspirin EC 81 MG tablet Take 81 mg by  mouth daily.   atorvastatin (LIPITOR) 10 MG tablet TAKE 1 TABLET EVERY DAY AT 6PM   cholecalciferol (VITAMIN D) 400 UNITS TABS tablet Take 1,000 Units by mouth.   diclofenac sodium (VOLTAREN) 1 % GEL Apply 3 grams to 3 large joints, up to 3 times daily as needed.   fluticasone (FLONASE) 50 MCG/ACT nasal spray Place 2 sprays into both nostrils daily.   Ginger, Zingiber officinalis, (GINGER PO) Take by mouth.   IBU 600 MG tablet TAKE 1 TABLET EVERY 8 HOURS AS NEEDED   levothyroxine (SYNTHROID) 137 MCG tablet TAKE 1 TABLET EVERY DAY BEFORE BREAKFAST   magnesium oxide (MAG-OX) 400 MG tablet Take 400 mg by mouth daily.    mirtazapine (REMERON) 15 MG tablet TAKE 1 AND 1/2 TABLETS EVERY EVENING   Misc Natural Products (TART CHERRY ADVANCED PO) Take by mouth daily.   Multiple Vitamins-Minerals (PRESERVISION AREDS 2 PO) Take by mouth 2 (two) times daily.   omega-3 acid ethyl esters (LOVAZA) 1 G capsule Take 1 g by mouth 2 (two) times daily.   Potassium Chloride ER 20 MEQ TBCR Take 1 tablet (20 mEq total) by mouth daily.   potassium chloride SA (KLOR-CON M) 20 MEQ tablet Take 20 mEq by mouth daily.   predniSONE (DELTASONE) 5 MG tablet TAKE 2 TABLETS EVERY DAY WITH BREAKFAST   tiZANidine (ZANAFLEX) 4 MG tablet TAKE 1 TABLET TWICE DAILY   torsemide (DEMADEX) 20 MG tablet TAKE 1 TABLET EVERY DAY (DISCONTINUE FUROSEMIDE)   torsemide 40 MG TABS Take 40  mg by mouth daily.   TURMERIC PO Take by mouth daily.   vitamin E 400 UNIT capsule Take 800 Units by mouth daily.    [DISCONTINUED] allopurinol (ZYLOPRIM) 100 MG tablet Take 2 tablets (200 mg total) by mouth daily.   [DISCONTINUED] carvedilol (COREG) 6.25 MG tablet TAKE 1 TABLET TWICE DAILY WITH MEALS   [DISCONTINUED] levocetirizine (XYZAL) 5 MG tablet TAKE 1 TABLET EVERY EVENING   [DISCONTINUED] losartan-hydrochlorothiazide (HYZAAR) 100-25 MG tablet Take 1 tablet by mouth daily.   [DISCONTINUED] oxyCODONE ER 18 MG C12A Take 18 mg by mouth 2 (two) times  daily.   [DISCONTINUED] Oxycodone HCl 10 MG TABS Take 1 tablet (10 mg total) by mouth daily as needed.   No facility-administered medications prior to visit.    Review of Systems See HPI    Last CBC Lab Results  Component Value Date   WBC 3.6 09/10/2022   HGB 11.8 09/10/2022   HCT 35.3 09/10/2022   MCV 95 09/10/2022   MCH 31.8 09/10/2022   RDW 11.8 09/10/2022   PLT 114 (L) 09/10/2022   Last metabolic panel Lab Results  Component Value Date   GLUCOSE 101 (H) 09/10/2022   NA 141 09/10/2022   K 4.5 09/10/2022   CL 97 09/10/2022   CO2 25 09/10/2022   BUN 38 (H) 09/10/2022   CREATININE 1.39 (H) 09/10/2022   EGFR 38 (L) 09/10/2022   CALCIUM 9.9 09/10/2022   PROT 7.8 09/10/2022   ALBUMIN 4.4 09/10/2022   LABGLOB 3.4 09/10/2022   AGRATIO 1.3 09/10/2022   BILITOT 0.7 09/10/2022   ALKPHOS 182 (H) 09/10/2022   AST 42 (H) 09/10/2022   ALT 23 09/10/2022   ANIONGAP 12 10/16/2019   Last lipids Lab Results  Component Value Date   CHOL 114 09/10/2022   HDL 53 09/10/2022   LDLCALC 44 09/10/2022   TRIG 84 09/10/2022   CHOLHDL 2.2 09/10/2022   Last hemoglobin A1c Lab Results  Component Value Date   HGBA1C 5.8 (H) 09/10/2022   Last thyroid functions Lab Results  Component Value Date   TSH 3.990 09/10/2022   Last vitamin D Lab Results  Component Value Date   VD25OH 55.9 09/10/2022       Objective     Today's Vitals   09/10/22 1103  BP: 127/73  Pulse: (Abnormal) 119  SpO2: 99%  Weight: 173 lb 12.8 oz (78.8 kg)  Height: 5\' 3"  (1.6 m)   Body mass index is 30.79 kg/m.  BP Readings from Last 3 Encounters:  09/10/22 127/73  08/18/22 (Abnormal) 127/27  07/06/22 127/77    Wt Readings from Last 3 Encounters:  09/10/22 173 lb 12.8 oz (78.8 kg)  08/18/22 175 lb (79.4 kg)  07/06/22 171 lb (77.6 kg)    Physical Exam Vitals and nursing note reviewed.  Constitutional:      Appearance: Normal appearance. She is well-developed.  HENT:     Head: Normocephalic  and atraumatic.     Nose: Nose normal.     Mouth/Throat:     Mouth: Mucous membranes are moist.     Pharynx: Oropharynx is clear.  Eyes:     Extraocular Movements: Extraocular movements intact.     Conjunctiva/sclera: Conjunctivae normal.     Pupils: Pupils are equal, round, and reactive to light.  Neck:     Vascular: No carotid bruit.  Cardiovascular:     Rate and Rhythm: Normal rate. Rhythm irregular.     Pulses: Normal pulses.     Heart  sounds: Murmur heard.  Pulmonary:     Effort: Pulmonary effort is normal.     Breath sounds: Normal breath sounds.  Abdominal:     General: Bowel sounds are normal. There is no distension.     Palpations: Abdomen is soft. There is no mass.     Tenderness: There is no abdominal tenderness. There is no right CVA tenderness, left CVA tenderness, guarding or rebound.     Hernia: No hernia is present.  Musculoskeletal:        General: Normal range of motion.     Cervical back: Normal range of motion and neck supple.     Comments:  Left foot deformity with swelling present and unchanged. ROM and strength of the left foot is diminished but at baseline.  The patient is using a walking cane to help her with mobility and balance.  Chronic low back pain making it difficult to walk in erect position. Bending at the waist and stooping to reach for things often increases pain. Leaning heavier on walking cane.gait slower than usual today   Lymphadenopathy:     Cervical: No cervical adenopathy.  Skin:    General: Skin is warm and dry.     Capillary Refill: Capillary refill takes less than 2 seconds.  Neurological:     General: No focal deficit present.     Mental Status: She is alert and oriented to person, place, and time.  Psychiatric:        Mood and Affect: Mood normal.        Behavior: Behavior normal.        Thought Content: Thought content normal.        Judgment: Judgment normal.     Results for orders placed or performed in visit on 09/10/22   Comprehensive metabolic panel  Result Value Ref Range   Glucose 101 (H) 70 - 99 mg/dL   BUN 38 (H) 8 - 27 mg/dL   Creatinine, Ser 4.09 (H) 0.57 - 1.00 mg/dL   eGFR 38 (L) >81 XB/JYN/8.29   BUN/Creatinine Ratio 27 12 - 28   Sodium 141 134 - 144 mmol/L   Potassium 4.5 3.5 - 5.2 mmol/L   Chloride 97 96 - 106 mmol/L   CO2 25 20 - 29 mmol/L   Calcium 9.9 8.7 - 10.3 mg/dL   Total Protein 7.8 6.0 - 8.5 g/dL   Albumin 4.4 3.7 - 4.7 g/dL   Globulin, Total 3.4 1.5 - 4.5 g/dL   Albumin/Globulin Ratio 1.3 1.2 - 2.2   Bilirubin Total 0.7 0.0 - 1.2 mg/dL   Alkaline Phosphatase 182 (H) 44 - 121 IU/L   AST 42 (H) 0 - 40 IU/L   ALT 23 0 - 32 IU/L  CBC  Result Value Ref Range   WBC 3.6 3.4 - 10.8 x10E3/uL   RBC 3.71 (L) 3.77 - 5.28 x10E6/uL   Hemoglobin 11.8 11.1 - 15.9 g/dL   Hematocrit 56.2 13.0 - 46.6 %   MCV 95 79 - 97 fL   MCH 31.8 26.6 - 33.0 pg   MCHC 33.4 31.5 - 35.7 g/dL   RDW 86.5 78.4 - 69.6 %   Platelets 114 (L) 150 - 450 x10E3/uL  Lipid panel  Result Value Ref Range   Cholesterol, Total 114 100 - 199 mg/dL   Triglycerides 84 0 - 149 mg/dL   HDL 53 >29 mg/dL   VLDL Cholesterol Cal 17 5 - 40 mg/dL   LDL Chol Calc (NIH) 44 0 -  99 mg/dL   Chol/HDL Ratio 2.2 0.0 - 4.4 ratio  Hemoglobin A1c  Result Value Ref Range   Hgb A1c MFr Bld 5.8 (H) 4.8 - 5.6 %   Est. average glucose Bld gHb Est-mCnc 120 mg/dL  VITAMIN D 25 Hydroxy (Vit-D Deficiency, Fractures)  Result Value Ref Range   Vit D, 25-Hydroxy 55.9 30.0 - 100.0 ng/mL  TSH + free T4  Result Value Ref Range   TSH 3.990 0.450 - 4.500 uIU/mL   Free T4 1.81 (H) 0.82 - 1.77 ng/dL    Assessment & Plan    Hypothyroidism (acquired) -     TSH + free T4; Future  Chronic atrial fibrillation (HCC) Assessment & Plan: Stable. Continue regular visits with cardiology as scheduled    Hyperlipidemia, mixed Assessment & Plan: Check fasting lipid panel and treat high cholesterol as indicated   Orders: -     Lipid panel;  Future  Vitamin D deficiency Assessment & Plan: Check vitamin d level and treat deficiency as indicated.    Orders: -     VITAMIN D 25 Hydroxy (Vit-D Deficiency, Fractures); Future -     Hemoglobin A1c; Future  Elevated glucose Assessment & Plan: Check labs with HgbA1c    Abnormal renal function Assessment & Plan: Reviewed abdominal ultrasound.  -15 mm left renal cyst, no imaging follow-up is recommended.Kidneys are otherwise unremarkable -repeat abdominal ultrasound in one year.   Orders: -     Comprehensive metabolic panel; Future  [name] (nonalcoholic steatohepatitis) Assessment & Plan: -reviewed abdominal ultrasound results with the patient.  1. Coarse heterogeneous liver parenchyma corresponding to history of liver disease. No focal hepatic abnormality 2. Status post cholecystectomy. Normal common bile duct diameter but mild intra hepatic biliary dilatation question due to postsurgical change, correlate with LFTs. -LFTs to be checked with upcoming labs  -repeat ultrasound in one year.      Orders: -     Comprehensive metabolic panel; Future -     CBC; Future  Inflammatory polyarthritis (HCC) Assessment & Plan: Patient's pain is controlled with current medications.  -continue oxycodone ER 18 mg twice daily  -may continue oxycodone 10 mg, taking 1/2 tp 1 tablet daily when needed for breakthrough pain.  -two, 30 day prescriptions sent to her pharmacy.   Orders: -     oxyCODONE ER; Take 18 mg by mouth 2 (two) times daily.  Dispense: 60 capsule; Refill: 0  Other osteoarthritis involving multiple joints Assessment & Plan: Patient's pain is controlled with current medications.  -continue oxycodone ER 18 mg twice daily  -may continue oxycodone 10 mg, taking 1/2 tp 1 tablet daily when needed for breakthrough pain.  -two, 30 day prescriptions sent to her pharmacy.   Orders: -     oxyCODONE HCl; Take 1 tablet (10 mg total) by mouth daily as needed.  Dispense: 30  tablet; Refill: 0     Return in about 2 months (around 11/10/2022) for blood pressure, med refills. MWV in 04/2023.         Carlean Jews, NP  [facility] at [facility] [phone] (phone) [phone] (fax)  [facility]

## 2022-09-11 LAB — COMPREHENSIVE METABOLIC PANEL
ALT: 23 IU/L (ref 0–32)
AST: 42 IU/L — ABNORMAL HIGH (ref 0–40)
Albumin/Globulin Ratio: 1.3 (ref 1.2–2.2)
Albumin: 4.4 g/dL (ref 3.7–4.7)
Alkaline Phosphatase: 182 IU/L — ABNORMAL HIGH (ref 44–121)
BUN/Creatinine Ratio: 27 (ref 12–28)
BUN: 38 mg/dL — ABNORMAL HIGH (ref 8–27)
Bilirubin Total: 0.7 mg/dL (ref 0.0–1.2)
CO2: 25 mmol/L (ref 20–29)
Calcium: 9.9 mg/dL (ref 8.7–10.3)
Chloride: 97 mmol/L (ref 96–106)
Creatinine, Ser: 1.39 mg/dL — ABNORMAL HIGH (ref 0.57–1.00)
Globulin, Total: 3.4 g/dL (ref 1.5–4.5)
Glucose: 101 mg/dL — ABNORMAL HIGH (ref 70–99)
Potassium: 4.5 mmol/L (ref 3.5–5.2)
Sodium: 141 mmol/L (ref 134–144)
Total Protein: 7.8 g/dL (ref 6.0–8.5)
eGFR: 38 mL/min/{1.73_m2} — ABNORMAL LOW (ref >59)

## 2022-09-11 LAB — VITAMIN D 25 HYDROXY (VIT D DEFICIENCY, FRACTURES): Vit D, 25-Hydroxy: 55.9 ng/mL (ref 30.0–100.0)

## 2022-09-11 LAB — TSH+FREE T4
Free T4: 1.81 ng/dL — ABNORMAL HIGH (ref 0.82–1.77)
TSH: 3.99 u[IU]/mL (ref 0.450–4.500)

## 2022-09-11 LAB — LIPID PANEL
Chol/HDL Ratio: 2.2 ratio (ref 0.0–4.4)
Cholesterol, Total: 114 mg/dL (ref 100–199)
HDL: 53 mg/dL (ref >39)
LDL Chol Calc (NIH): 44 mg/dL (ref 0–99)
Triglycerides: 84 mg/dL (ref 0–149)
VLDL Cholesterol Cal: 17 mg/dL (ref 5–40)

## 2022-09-11 LAB — CBC
Hematocrit: 35.3 % (ref 34.0–46.6)
Hemoglobin: 11.8 g/dL (ref 11.1–15.9)
MCH: 31.8 pg (ref 26.6–33.0)
MCHC: 33.4 g/dL (ref 31.5–35.7)
MCV: 95 fL (ref 79–97)
Platelets: 114 10*3/uL — ABNORMAL LOW (ref 150–450)
RBC: 3.71 x10E6/uL — ABNORMAL LOW (ref 3.77–5.28)
RDW: 11.8 % (ref 11.7–15.4)
WBC: 3.6 10*3/uL (ref 3.4–10.8)

## 2022-09-11 LAB — HEMOGLOBIN A1C
Est. average glucose Bld gHb Est-mCnc: 120 mg/dL
Hgb A1c MFr Bld: 5.8 % — ABNORMAL HIGH (ref 4.8–5.6)

## 2022-09-14 ENCOUNTER — Other Ambulatory Visit: Payer: Self-pay

## 2022-09-14 DIAGNOSIS — I6523 Occlusion and stenosis of bilateral carotid arteries: Secondary | ICD-10-CM

## 2022-09-14 DIAGNOSIS — J309 Allergic rhinitis, unspecified: Secondary | ICD-10-CM

## 2022-09-14 MED ORDER — CARVEDILOL 6.25 MG PO TABS: 6.2500 mg | ORAL_TABLET | Freq: Two times a day (BID) | ORAL | 1 refills | Status: DC

## 2022-09-14 MED ORDER — LEVOCETIRIZINE DIHYDROCHLORIDE 5 MG PO TABS
5.0000 mg | ORAL_TABLET | Freq: Every evening | ORAL | 1 refills | Status: DC
Start: 2022-09-14 — End: 2023-02-08

## 2022-09-16 DIAGNOSIS — H353221 Exudative age-related macular degeneration, left eye, with active choroidal neovascularization: Secondary | ICD-10-CM | POA: Diagnosis not present

## 2022-09-16 DIAGNOSIS — H353211 Exudative age-related macular degeneration, right eye, with active choroidal neovascularization: Secondary | ICD-10-CM | POA: Diagnosis not present

## 2022-09-28 ENCOUNTER — Other Ambulatory Visit: Payer: Self-pay | Admitting: Nurse Practitioner

## 2022-09-28 DIAGNOSIS — I10 Essential (primary) hypertension: Secondary | ICD-10-CM

## 2022-09-28 DIAGNOSIS — Z8739 Personal history of other diseases of the musculoskeletal system and connective tissue: Secondary | ICD-10-CM

## 2022-10-01 DIAGNOSIS — H353211 Exudative age-related macular degeneration, right eye, with active choroidal neovascularization: Secondary | ICD-10-CM | POA: Diagnosis not present

## 2022-10-01 DIAGNOSIS — H353221 Exudative age-related macular degeneration, left eye, with active choroidal neovascularization: Secondary | ICD-10-CM | POA: Diagnosis not present

## 2022-10-15 NOTE — Assessment & Plan Note (Addendum)
-  reviewed abdominal ultrasound results with the patient.  1. Coarse heterogeneous liver parenchyma corresponding to history of liver disease. No focal hepatic abnormality 2. Status post cholecystectomy. Normal common bile duct diameter but mild intra hepatic biliary dilatation question due to postsurgical change, correlate with LFTs. -LFTs to be checked with upcoming labs  -repeat ultrasound in one year.

## 2022-10-15 NOTE — Assessment & Plan Note (Signed)
>>  ASSESSMENT AND PLAN FOR ABNORMAL RENAL FUNCTION WRITTEN ON 10/15/2022 11:22 AM BY BOSCIA, HEATHER E, NP  Reviewed abdominal ultrasound.  -15 mm left renal cyst, no imaging follow-up is recommended.Kidneys are otherwise unremarkable -repeat abdominal ultrasound in one year.

## 2022-10-15 NOTE — Assessment & Plan Note (Signed)
Stable. Continue regular visits with cardiology as scheduled.  

## 2022-10-15 NOTE — Assessment & Plan Note (Signed)
Check fasting lipid panel and treat high cholesterol as indicated

## 2022-10-15 NOTE — Assessment & Plan Note (Signed)
>>  ASSESSMENT AND PLAN FOR [name] (NONALCOHOLIC STEATOHEPATITIS) WRITTEN ON 10/15/2022 11:15 AM BY BOSCIA, HEATHER E, NP  -reviewed abdominal ultrasound results with the patient.  1. Coarse heterogeneous liver parenchyma corresponding to history of liver disease. No focal hepatic abnormality 2. Status post cholecystectomy. Normal common bile duct diameter but mild intra hepatic biliary dilatation question due to postsurgical change, correlate with LFTs. -LFTs to be checked with upcoming labs  -repeat ultrasound in one year.

## 2022-10-15 NOTE — Assessment & Plan Note (Signed)
Reviewed abdominal ultrasound.  -15 mm left renal cyst, no imaging follow-up is recommended.Kidneys are otherwise unremarkable -repeat abdominal ultrasound in one year.

## 2022-10-15 NOTE — Assessment & Plan Note (Signed)
Check vitamin d level and treat deficiency as indicated.   

## 2022-10-15 NOTE — Assessment & Plan Note (Signed)
Patient's pain is controlled with current medications.  -continue oxycodone ER 18 mg twice daily  -may continue oxycodone 10 mg, taking 1/2 tp 1 tablet daily when needed for breakthrough pain.  -two, 30 day prescriptions sent to her pharmacy.

## 2022-10-15 NOTE — Assessment & Plan Note (Signed)
Check thyroid panel and adjust levothyroxine dosing as indicated

## 2022-10-15 NOTE — Assessment & Plan Note (Signed)
Check labs with HgbA1c  

## 2022-10-15 NOTE — Assessment & Plan Note (Signed)
Patient's pain is controlled with current medications.  -continue oxycodone ER 18 mg twice daily  -may continue oxycodone 10 mg, taking 1/2 tp 1 tablet daily when needed for breakthrough pain.  -two, 30 day prescriptions sent to her pharmacy.  

## 2022-10-30 ENCOUNTER — Ambulatory Visit: Payer: Medicare HMO | Attending: Cardiology | Admitting: Cardiology

## 2022-10-30 ENCOUNTER — Encounter: Payer: Self-pay | Admitting: Cardiology

## 2022-10-30 VITALS — BP 114/70 | HR 94 | Ht 63.0 in | Wt 175.8 lb

## 2022-10-30 DIAGNOSIS — I10 Essential (primary) hypertension: Secondary | ICD-10-CM | POA: Diagnosis not present

## 2022-10-30 DIAGNOSIS — I5189 Other ill-defined heart diseases: Secondary | ICD-10-CM | POA: Diagnosis not present

## 2022-10-30 DIAGNOSIS — I4891 Unspecified atrial fibrillation: Secondary | ICD-10-CM | POA: Diagnosis not present

## 2022-10-30 NOTE — Progress Notes (Signed)
Cardiology Office Note:    Date:  10/30/2022   ID:  Martha Peterson, DOB [birth year], MRN [id number]  PCP:  Carlean Jews, NP  Cardiologist:  Debbe Odea, MD  Electrophysiologist:  None   Referring MD: Carlean Jews, NP   Chief Complaint  Patient presents with   Follow-up    Patient denies new or acute cardiac problems/concerns today.      History of Present Illness:    Martha Peterson is a 82 y.o. female with a hx of permanent atrial fibrillation, HFpEF, hypertension, hyperlipidemia, carotid stenosis, innominate artery stenosis who presents for follow-up.  Denies chest pain, palpitations.  Tolerating Eliquis for stroke prophylaxis, no bleeding issues.  Blood pressure is adequately controlled.  Compliant with medications as prescribed.  Feels well, no new concerns at this time.   Prior notes Echo 09/2019 EF 60 to 65%, grade 2 diastolic dysfunction, severely dilated left atrium Patient has a history of mitral regurgitation being followed periodically with echocardiogram.  She has a known history of carotid stenosis with carotid bruit.CT angio of the neck showed high-grade calcific stenosis innominate artery, flow-limiting stenosis in the right carotid system.  There is sclerosis with calcification in the carotid bifurcation bilaterally without significant stenosis.  She is seeing vascular surgery for carotid artery disease.  She takes a baby aspirin and Lipitor 10 mg.  Has a history of liver abnormality hence the low-dose statin.   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   Microhematuria    Mild mitral regurgitation    Mild tricuspid regurgitation    Osteopenia    Pulmonary fibrosis  (HCC)    Urinary incontinence     Past Surgical History:  Procedure Laterality Date   ABDOMINAL HYSTERECTOMY     ANKLE SURGERY Left    APPENDECTOMY     CAROTID PTA/STENT INTERVENTION N/A 07/17/2019   Procedure: CAROTID PTA/STENT INTERVENTION;  Surgeon: Annice Needy, MD;  Location: [facility] INVASIVE CV LAB;  Service: Cardiovascular;  Laterality: N/A;   CAROTID-SUBCLAVIAN BYPASS GRAFT N/A 07/26/2019   Procedure: BYPASS GRAFT CAROTID-SUBCLAVIAN ( CAROTID TO CAROTID BYPASS);  Surgeon: Annice Needy, MD;  Location: [facility] ORS;  Service: Vascular;  Laterality: N/A;   CHOLECYSTECTOMY     COLONOSCOPY WITH PROPOFOL N/A 12/04/2014   Procedure: COLONOSCOPY WITH PROPOFOL;  Surgeon: Christena Deem, MD;  Location: [facility] ENDOSCOPY;  Service: Endoscopy;  Laterality: N/A;   FRACTURE SURGERY     Femur Fx; LT Ankle Pinning   JOINT REPLACEMENT     RT TKR   Patial Thyroid Resection      Current Medications: Current Meds  Medication Sig   allopurinol (ZYLOPRIM) 100 MG tablet TAKE 2 TABLETS EVERY DAY   apixaban (ELIQUIS) 5 MG TABS tablet Take 1 tablet (5 mg total) by mouth 2 (two) times daily.   ascorbic acid (VITAMIN C) 500 MG tablet Take 500 mg by mouth daily.   aspirin EC 81 MG tablet Take 81 mg by mouth daily.   atorvastatin (LIPITOR) 10 MG tablet TAKE 1 TABLET EVERY DAY AT 6PM   carvedilol (COREG) 6.25 MG tablet Take 1 tablet (6.25 mg total) by mouth 2 (two)  times daily with a meal.   cholecalciferol (VITAMIN D) 400 UNITS TABS tablet Take 1,000 Units by mouth.   diclofenac sodium (VOLTAREN) 1 % GEL Apply 3 grams to 3 large joints, up to 3 times daily as needed.   fluticasone (FLONASE) 50 MCG/ACT nasal spray Place 2 sprays into both nostrils daily.   Ginger, Zingiber officinalis, (GINGER PO) Take by mouth.   IBU 600 MG tablet TAKE 1 TABLET EVERY 8 HOURS AS NEEDED   levocetirizine (XYZAL) 5 MG tablet Take 1 tablet (5 mg total) by mouth every evening.   levothyroxine (SYNTHROID) 137 MCG tablet TAKE 1 TABLET  EVERY DAY BEFORE BREAKFAST   losartan-hydrochlorothiazide (HYZAAR) 100-25 MG tablet TAKE 1 TABLET EVERY DAY   magnesium oxide (MAG-OX) 400 MG tablet Take 400 mg by mouth daily.    mirtazapine (REMERON) 15 MG tablet TAKE 1 AND 1/2 TABLETS EVERY EVENING   Misc Natural Products (TART CHERRY ADVANCED PO) Take by mouth daily.   Multiple Vitamins-Minerals (PRESERVISION AREDS 2 PO) Take by mouth 2 (two) times daily.   omega-3 acid ethyl esters (LOVAZA) 1 G capsule Take 1 g by mouth 2 (two) times daily.   oxyCODONE ER 18 MG C12A Take 18 mg by mouth 2 (two) times daily.   Oxycodone HCl 10 MG TABS Take 1 tablet (10 mg total) by mouth daily as needed.   Potassium Chloride ER 20 MEQ TBCR Take 1 tablet (20 mEq total) by mouth daily.   potassium chloride SA (KLOR-CON M) 20 MEQ tablet Take 20 mEq by mouth daily.   predniSONE (DELTASONE) 5 MG tablet TAKE 2 TABLETS EVERY DAY WITH BREAKFAST   tiZANidine (ZANAFLEX) 4 MG tablet TAKE 1 TABLET TWICE DAILY   torsemide (DEMADEX) 20 MG tablet TAKE 1 TABLET EVERY DAY (DISCONTINUE FUROSEMIDE)   torsemide 40 MG TABS Take 40 mg by mouth daily.   TURMERIC PO Take by mouth daily.   vitamin E 400 UNIT capsule Take 800 Units by mouth daily.      Allergies:   Latex, Codeine, Levaquin [levofloxacin], and Tape   Social History   Socioeconomic History   Marital status: Widowed    Spouse name: Fayrene Fearing   Number of children: Not on file   Years of education: Not on file   Highest education level: Not on file  Occupational History   Not on file  Tobacco Use   Smoking status: Former    Years: 1    Types: Cigarettes    Passive exposure: Never   Smokeless tobacco: Never  Vaping Use   Vaping Use: Never used  Substance and Sexual Activity   Alcohol use: No   Drug use: No   Sexual activity: Not Currently  Other Topics Concern   Not on file  Social History Narrative   Lives at home with husband in private residence   Social Determinants of Health   Financial  Resource Strain: Not on file  Food Insecurity: Not on file  Transportation Needs: Not on file  Physical Activity: Not on file  Stress: Not on file  Social Connections: Not on file     Family History: The patient's family history includes Anxiety disorder in her daughter; Arthritis in her daughter; Depression in her daughter; Diabetes in her daughter, father, and son; Heart Problems in her father, mother, and sister; Hypertension in her son; Lupus in her sister; Neuropathy in her daughter; Thyroid disease in her sister.  ROS:   Please see the history of present illness.  All other systems reviewed and are negative.  EKGs/Labs/Other Studies Reviewed:    EKG:  EKG is  ordered today.  EKG shows atrial fibrillation, heart rate 94  Recent Labs: 09/10/2022: ALT 23; BUN 38; Creatinine, Ser 1.39; Hemoglobin 11.8; Platelets 114; Potassium 4.5; Sodium 141; TSH 3.990  Recent Lipid Panel    Component Value Date/Time   CHOL 114 09/10/2022 1141   TRIG 84 09/10/2022 1141   HDL 53 09/10/2022 1141   CHOLHDL 2.2 09/10/2022 1141   LDLCALC 44 09/10/2022 1141    Physical Exam:    VS:  BP 114/70 (BP Location: Left Arm, Patient Position: Sitting, Cuff Size: Large)   Pulse 94   Ht 5\' 3"  (1.6 m)   Wt 175 lb 12.8 oz (79.7 kg)   SpO2 94%   BMI 31.14 kg/m     Wt Readings from Last 3 Encounters:  10/30/22 175 lb 12.8 oz (79.7 kg)  09/10/22 173 lb 12.8 oz (78.8 kg)  08/18/22 175 lb (79.4 kg)     GEN:  Well nourished, well developed in no acute distress HEENT: Normal NECK: No JVD; bilateral carotid bruits noted CARDIAC: irregular irregular, 2/6 systolic murmur noted, RESPIRATORY:  Clear to auscultation without rales, wheezing or rhonchi  ABDOMEN: Soft, non-tender, non-distended MUSCULOSKELETAL: Trace to 1+ edema; varicose veins noted SKIN: Warm and dry NEUROLOGIC:  Alert and oriented x 3 PSYCHIATRIC:  Normal affect   ASSESSMENT:    1. Atrial fibrillation, unspecified type (HCC)   2.  Grade II diastolic dysfunction   3. Primary hypertension    PLAN:    In order of problems listed above:  Persistent atrial fibrillation, CHA2DS2-VASc of 5 (age, htn, vasc, gender).  Heart rate controlled.  Cont coreg 6.25 mg twice daily, Eliquis 5 mg twice daily.  EF 60 to 6 Grade 2 diastolic dysfunction, continue torsemide 40 mg daily Hypertension, BP controlled. Continue Coreg, Hyzaar 100-25 mg qd.  Follow-up in 12 months  Medication Adjustments/Labs and Tests Ordered: Current medicines are reviewed at length with the patient today.  Concerns regarding medicines are outlined above.  Orders Placed This Encounter  Procedures   EKG 12-Lead    No orders of the defined types were placed in this encounter.    Patient Instructions  Medication Instructions:   Your physician recommends that you continue on your current medications as directed. Please refer to the Current Medication list given to you today.  *If you need a refill on your cardiac medications before your next appointment, please call your pharmacy*   Lab Work:  None Ordered  If you have labs (blood work) drawn today and your tests are completely normal, you will receive your results only by: MyChart Message (if you have MyChart) OR A paper copy in the mail If you have any lab test that is abnormal or we need to change your treatment, we will call you to review the results.   Testing/Procedures:  None Ordered    Follow-Up: At [facility], you and your health needs are our priority.  As part of our continuing mission to provide you with exceptional heart care, we have created designated Provider Care Teams.  These Care Teams include your primary Cardiologist (physician) and Advanced Practice Providers (APPs -  Physician Assistants and Nurse Practitioners) who all work together to provide you with the care you need, when you need it.  We recommend signing up for the patient portal called "MyChart".   Sign up information is provided on this After  Visit Summary.  MyChart is used to connect with patients for Virtual Visits (Telemedicine).  Patients are able to view lab/test results, encounter notes, upcoming appointments, etc.  Non-urgent messages can be sent to your provider as well.   To learn more about what you can do with MyChart, go to [email or web address].    Your next appointment:   12 month(s)  Provider:   You may see Debbe Odea, MD or one of the following Advanced Practice Providers on your designated Care Team:   Nicolasa Ducking, NP Eula Listen, PA-C Cadence Fransico Michael, PA-C Charlsie Quest, NP    Signed, Debbe Odea, MD  10/30/2022 2:24 PM    [facility]

## 2022-10-30 NOTE — Patient Instructions (Signed)
Medication Instructions:   Your physician recommends that you continue on your current medications as directed. Please refer to the Current Medication list given to you today.  *If you need a refill on your cardiac medications before your next appointment, please call your pharmacy*   Lab Work:  None Ordered  If you have labs (blood work) drawn today and your tests are completely normal, you will receive your results only by: MyChart Message (if you have MyChart) OR A paper copy in the mail If you have any lab test that is abnormal or we need to change your treatment, we will call you to review the results.   Testing/Procedures:  None Ordered    Follow-Up: At [facility], you and your health needs are our priority.  As part of our continuing mission to provide you with exceptional heart care, we have created designated Provider Care Teams.  These Care Teams include your primary Cardiologist (physician) and Advanced Practice Providers (APPs -  Physician Assistants and Nurse Practitioners) who all work together to provide you with the care you need, when you need it.  We recommend signing up for the patient portal called "MyChart".  Sign up information is provided on this After Visit Summary.  MyChart is used to connect with patients for Virtual Visits (Telemedicine).  Patients are able to view lab/test results, encounter notes, upcoming appointments, etc.  Non-urgent messages can be sent to your provider as well.   To learn more about what you can do with MyChart, go to [email or web address].    Your next appointment:   12 month(s)  Provider:   You may see Brian Agbor-Etang, MD or one of the following Advanced Practice Providers on your designated Care Team:   Christopher Berge, NP Ryan Dunn, PA-C Cadence Furth, PA-C Sheri Hammock, NP  

## 2022-11-11 ENCOUNTER — Encounter: Payer: Self-pay | Admitting: Nurse Practitioner

## 2022-11-11 ENCOUNTER — Ambulatory Visit (INDEPENDENT_AMBULATORY_CARE_PROVIDER_SITE_OTHER): Payer: Medicare HMO | Admitting: Nurse Practitioner

## 2022-11-11 VITALS — BP 116/63 | HR 94 | Ht 63.0 in | Wt 177.1 lb

## 2022-11-11 DIAGNOSIS — M158 Other polyosteoarthritis: Secondary | ICD-10-CM | POA: Diagnosis not present

## 2022-11-11 DIAGNOSIS — M064 Inflammatory polyarthropathy: Secondary | ICD-10-CM

## 2022-11-11 DIAGNOSIS — N183 Chronic kidney disease, stage 3 unspecified: Secondary | ICD-10-CM

## 2022-11-11 DIAGNOSIS — I482 Chronic atrial fibrillation, unspecified: Secondary | ICD-10-CM

## 2022-11-11 DIAGNOSIS — I10 Essential (primary) hypertension: Secondary | ICD-10-CM | POA: Diagnosis not present

## 2022-11-11 DIAGNOSIS — E039 Hypothyroidism, unspecified: Secondary | ICD-10-CM

## 2022-11-11 DIAGNOSIS — I6523 Occlusion and stenosis of bilateral carotid arteries: Secondary | ICD-10-CM | POA: Diagnosis not present

## 2022-11-11 DIAGNOSIS — K7581 Nonalcoholic steatohepatitis (NASH): Secondary | ICD-10-CM | POA: Diagnosis not present

## 2022-11-11 MED ORDER — OXYCODONE ER 18 MG PO C12A
18.0000 mg | EXTENDED_RELEASE_CAPSULE | Freq: Two times a day (BID) | ORAL | 0 refills | Status: DC
Start: 2022-11-11 — End: 2022-11-11

## 2022-11-11 MED ORDER — OXYCODONE HCL 10 MG PO TABS
10.0000 mg | ORAL_TABLET | Freq: Two times a day (BID) | ORAL | 0 refills | Status: DC | PRN
Start: 2022-11-11 — End: 2023-01-14

## 2022-11-11 MED ORDER — OXYCODONE HCL 10 MG PO TABS
10.0000 mg | ORAL_TABLET | Freq: Two times a day (BID) | ORAL | 0 refills | Status: DC | PRN
Start: 2022-11-11 — End: 2022-11-11

## 2022-11-11 MED ORDER — OXYCODONE ER 18 MG PO C12A
18.0000 mg | EXTENDED_RELEASE_CAPSULE | Freq: Two times a day (BID) | ORAL | 0 refills | Status: DC
Start: 2022-11-11 — End: 2023-01-14

## 2022-11-11 NOTE — Progress Notes (Signed)
Established patient visit   Patient: Martha Peterson   DOB: Sep 19, 1940   82 y.o. Female  MRN: [id number] Visit Date: 11/11/2022   Chief Complaint  Patient presents with   Medical Management of Chronic Issues   Subjective    HPI  -Doing well with current prescriptions for pain management.  She is now taking oxycodone ER 18 mg twice daily.  Oxycodone 10 mg daily if needed for breakthrough pain.  -states that recently, she has been taking 1.5 tablets on some days of the IR oxycodone due to increased levels of pain. States that doing this is causing her to run short.  Indication for chronic opioid: chronic pain and inflammatory polyarthritis. Patient unable to take NSAIDs due to treatment with blood thinners. 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  Medication and dose: Oxycodone XR 18 mg twice daily and oxycodone 10mg  QD prn for breakthrough pain  # pills per month: 60 oxycodone XR and 30 oxycodone 10mg  Last UDS date: 03/31/2021 Opioid Treatment Agreement signed (Y/N): yes Opioid Treatment Agreement last reviewed with patient:  03/31/2021 [facility] reviewed this encounter (include red flags): no  PDMP profile reviewed 11/11/2022 -overdose risk score is below average at 030 with no red flags  or state indicators  -last fill of Oxycodone ER 18mg  was 10/11/2022 -last fill Oxycodone 10mg  10/11/2022  Labs done since last visit  -renal functions slightly elevated, generally stable.  -mild elevation of AST -HgbA1c 5.8 with glucose 101 -normal cholesterol  -thyroid stable   Medications: Outpatient Medications Prior to Visit  Medication Sig   allopurinol (ZYLOPRIM) 100 MG tablet TAKE 2 TABLETS EVERY DAY   apixaban (ELIQUIS) 5 MG TABS tablet Take 1 tablet (5 mg total) by mouth 2 (two) times daily.   ascorbic acid (VITAMIN C) 500 MG tablet Take 500 mg by mouth daily.   aspirin EC 81 MG tablet Take 81 mg by mouth daily.   atorvastatin (LIPITOR) 10 MG  tablet TAKE 1 TABLET EVERY DAY AT 6PM   carvedilol (COREG) 6.25 MG tablet Take 1 tablet (6.25 mg total) by mouth 2 (two) times daily with a meal.   cholecalciferol (VITAMIN D) 400 UNITS TABS tablet Take 1,000 Units by mouth.   diclofenac sodium (VOLTAREN) 1 % GEL Apply 3 grams to 3 large joints, up to 3 times daily as needed.   fluticasone (FLONASE) 50 MCG/ACT nasal spray Place 2 sprays into both nostrils daily.   Ginger, Zingiber officinalis, (GINGER PO) Take by mouth.   IBU 600 MG tablet TAKE 1 TABLET EVERY 8 HOURS AS NEEDED   levocetirizine (XYZAL) 5 MG tablet Take 1 tablet (5 mg total) by mouth every evening.   levothyroxine (SYNTHROID) 137 MCG tablet TAKE 1 TABLET EVERY DAY BEFORE BREAKFAST   losartan-hydrochlorothiazide (HYZAAR) 100-25 MG tablet TAKE 1 TABLET EVERY DAY   magnesium oxide (MAG-OX) 400 MG tablet Take 400 mg by mouth daily.    mirtazapine (REMERON) 15 MG tablet TAKE 1 AND 1/2 TABLETS EVERY EVENING   Misc Natural Products (TART CHERRY ADVANCED PO) Take by mouth daily.   Multiple Vitamins-Minerals (PRESERVISION AREDS 2 PO) Take by mouth 2 (two) times daily.   omega-3 acid ethyl esters (LOVAZA) 1 G capsule Take 1 g by mouth 2 (two) times daily.   Potassium Chloride ER 20 MEQ TBCR Take 1 tablet (20 mEq total) by mouth daily.   potassium chloride SA (KLOR-CON M) 20 MEQ tablet Take 20 mEq by mouth daily.   predniSONE (  DELTASONE) 5 MG tablet TAKE 2 TABLETS EVERY DAY WITH BREAKFAST   tiZANidine (ZANAFLEX) 4 MG tablet TAKE 1 TABLET TWICE DAILY   torsemide (DEMADEX) 20 MG tablet TAKE 1 TABLET EVERY DAY (DISCONTINUE FUROSEMIDE)   torsemide 40 MG TABS Take 40 mg by mouth daily.   TURMERIC PO Take by mouth daily.   vitamin E 400 UNIT capsule Take 800 Units by mouth daily.    [DISCONTINUED] oxyCODONE ER 18 MG C12A Take 18 mg by mouth 2 (two) times daily.   [DISCONTINUED] Oxycodone HCl 10 MG TABS Take 1 tablet (10 mg total) by mouth daily as needed.   No facility-administered  medications prior to visit.    Review of Systems See HPI    Last CBC Lab Results  Component Value Date   WBC 3.6 09/10/2022   HGB 11.8 09/10/2022   HCT 35.3 09/10/2022   MCV 95 09/10/2022   MCH 31.8 09/10/2022   RDW 11.8 09/10/2022   PLT 114 (L) 09/10/2022   Last metabolic panel Lab Results  Component Value Date   GLUCOSE 101 (H) 09/10/2022   NA 141 09/10/2022   K 4.5 09/10/2022   CL 97 09/10/2022   CO2 25 09/10/2022   BUN 38 (H) 09/10/2022   CREATININE 1.39 (H) 09/10/2022   EGFR 38 (L) 09/10/2022   CALCIUM 9.9 09/10/2022   PROT 7.8 09/10/2022   ALBUMIN 4.4 09/10/2022   LABGLOB 3.4 09/10/2022   AGRATIO 1.3 09/10/2022   BILITOT 0.7 09/10/2022   ALKPHOS 182 (H) 09/10/2022   AST 42 (H) 09/10/2022   ALT 23 09/10/2022   ANIONGAP 12 10/16/2019   Last lipids Lab Results  Component Value Date   CHOL 114 09/10/2022   HDL 53 09/10/2022   LDLCALC 44 09/10/2022   TRIG 84 09/10/2022   CHOLHDL 2.2 09/10/2022   Last hemoglobin A1c Lab Results  Component Value Date   HGBA1C 5.8 (H) 09/10/2022   Last thyroid functions Lab Results  Component Value Date   TSH 3.990 09/10/2022   Last vitamin D Lab Results  Component Value Date   VD25OH 55.9 09/10/2022   Last vitamin B12 and Folate Lab Results  Component Value Date   VITAMINB12 885 02/06/2020   FOLATE 18.5 02/06/2020       Objective     Today's Vitals   11/11/22 1404  BP: 116/63  Pulse: 94  SpO2: 95%  Weight: 177 lb 1.9 oz (80.3 kg)  Height: 5\' 3"  (1.6 m)   Body mass index is 31.38 kg/m.  BP Readings from Last 3 Encounters:  11/11/22 116/63  10/30/22 114/70  09/10/22 127/73    Wt Readings from Last 3 Encounters:  11/11/22 177 lb 1.9 oz (80.3 kg)  10/30/22 175 lb 12.8 oz (79.7 kg)  09/10/22 173 lb 12.8 oz (78.8 kg)    Physical Exam Vitals and nursing note reviewed.  Constitutional:      Appearance: Normal appearance. She is well-developed.  HENT:     Head: Normocephalic and atraumatic.      Nose: Nose normal.     Mouth/Throat:     Mouth: Mucous membranes are moist.     Pharynx: Oropharynx is clear.  Eyes:     Extraocular Movements: Extraocular movements intact.     Conjunctiva/sclera: Conjunctivae normal.     Pupils: Pupils are equal, round, and reactive to light.  Neck:     Vascular: No carotid bruit.  Cardiovascular:     Rate and Rhythm: Normal rate. Rhythm irregular.  Pulses: Normal pulses.     Heart sounds: Murmur heard.  Pulmonary:     Effort: Pulmonary effort is normal.     Breath sounds: Normal breath sounds.  Abdominal:     General: Bowel sounds are normal. There is no distension.     Palpations: Abdomen is soft. There is no mass.     Tenderness: There is no abdominal tenderness. There is no right CVA tenderness, left CVA tenderness, guarding or rebound.     Hernia: No hernia is present.  Musculoskeletal:        General: Normal range of motion.     Cervical back: Normal range of motion and neck supple.     Comments:  Left foot deformity with swelling present and unchanged. ROM and strength of the left foot is diminished but at baseline.  The patient is using a walking cane to help her with mobility and balance.  Chronic low back pain making it difficult to walk in erect position. Bending at the waist and stooping to reach for things often increases pain. Leaning heavier on walking cane.gait slower than usual today   Lymphadenopathy:     Cervical: No cervical adenopathy.  Skin:    General: Skin is warm and dry.     Capillary Refill: Capillary refill takes less than 2 seconds.  Neurological:     General: No focal deficit present.     Mental Status: She is alert and oriented to person, place, and time.  Psychiatric:        Mood and Affect: Mood normal.        Behavior: Behavior normal.        Thought Content: Thought content normal.        Judgment: Judgment normal.       Assessment & Plan    Essential hypertension Assessment & Plan: blood  pressure control important in reducing the progression of atherosclerotic disease. On appropriate oral medications.  Her log shows good blood pressure control and she may be able to back off on some of her medications going forward at the discretion of her primary care provider.     Chronic atrial fibrillation (HCC) Assessment & Plan: Stable. 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 -Stable. -Continue regular visits with vascular surgery.   Inflammatory polyarthritis (HCC) Assessment & Plan: Patient's pain is controlled with current medications.  -continue oxycodone ER 18 mg twice daily  -increase oxycodone 10 mg, to 1 tablet up to  twice daily when needed for breakthrough pain.  -two, 30 day prescriptions sent to her pharmacy.   Orders: -     oxyCODONE ER; Take 18 mg by mouth 2 (two) times daily.  Dispense: 60 capsule; Refill: 0  Other osteoarthritis involving multiple joints Assessment & Plan: Patient's pain is controlled with current medications.  -continue oxycodone ER 18 mg twice daily  -incresee oxycodone 10 mg, take up to 1 tablet twice daily when needed for breakthrough pain.  -two, 30 day prescriptions sent to her pharmacy.   Orders: -     oxyCODONE HCl; Take 1 tablet (10 mg total) by mouth 2 (two) times daily as needed.  Dispense: 45 tablet; Refill: 0  Stage 3 chronic kidney disease, unspecified whether stage 3a or 3b CKD (HCC) Assessment & Plan: Most recent kidney function is stable. -Continue to monitor.   [name] (nonalcoholic steatohepatitis) Assessment & Plan: -reviewed abdominal ultrasound results  with the patient.  1. Coarse heterogeneous liver parenchyma corresponding to history of liver disease. No focal hepatic abnormality 2. Status post cholecystectomy. Normal common bile duct diameter but mild intra hepatic biliary  dilatation question due to postsurgical change, correlate with LFTs. -LFTs to be checked with upcoming labs  -repeat ultrasound in one year.       Hypothyroidism (acquired) Assessment & Plan: Stable. -Continue levothyroxine as prescribed.      Return in about 2 months (around 01/11/2023) for med refills. pain management - MWV 04/2023.         Carlean Jews, NP  [facility] [phone] (phone) [phone] (fax)  [facility]

## 2022-11-12 DIAGNOSIS — H353221 Exudative age-related macular degeneration, left eye, with active choroidal neovascularization: Secondary | ICD-10-CM | POA: Diagnosis not present

## 2022-11-12 NOTE — Addendum Note (Signed)
Addended by: Vincent Gros on: 11/12/2022 06:19 AM   Modules accepted: Level of Service

## 2022-11-29 NOTE — Assessment & Plan Note (Signed)
Patient's pain is controlled with current medications.  -continue oxycodone ER 18 mg twice daily  -incresee oxycodone 10 mg, take up to 1 tablet twice daily when needed for breakthrough pain.  -two, 30 day prescriptions sent to her pharmacy.

## 2022-11-29 NOTE — Assessment & Plan Note (Signed)
Stable. Continue regular visits with cardiology as scheduled.  

## 2022-11-29 NOTE — Assessment & Plan Note (Signed)
Most recent kidney function is stable. -Continue to monitor.

## 2022-11-29 NOTE — Assessment & Plan Note (Signed)
blood pressure control important in reducing the progression of atherosclerotic disease. On appropriate oral medications.  Her log shows good blood pressure control and she may be able to back off on some of her medications going forward at the discretion of her primary care provider.   

## 2022-11-29 NOTE — Assessment & Plan Note (Signed)
Stable. Continue levothyroxine as prescribed. 

## 2022-11-29 NOTE — Assessment & Plan Note (Signed)
S/p carotid artery endarterectomy. -Stable. -Continue regular visits with vascular surgery.

## 2022-11-29 NOTE — Assessment & Plan Note (Signed)
Patient's pain is controlled with current medications.  -continue oxycodone ER 18 mg twice daily  -increase oxycodone 10 mg, to 1 tablet up to  twice daily when needed for breakthrough pain.  -two, 30 day prescriptions sent to her pharmacy.

## 2022-11-29 NOTE — Assessment & Plan Note (Signed)
>>  ASSESSMENT AND PLAN FOR [name] (NONALCOHOLIC STEATOHEPATITIS) WRITTEN ON 11/29/2022  3:46 PM BY BOSCIA, HEATHER E, NP  -reviewed abdominal ultrasound results with the patient.  1. Coarse heterogeneous liver parenchyma corresponding to history of liver disease. No focal hepatic abnormality 2. Status post cholecystectomy. Normal common bile duct diameter but mild intra hepatic biliary dilatation question due to postsurgical change, correlate with LFTs. -LFTs to be checked with upcoming labs  -repeat ultrasound in one year.

## 2022-11-29 NOTE — Assessment & Plan Note (Signed)
-  reviewed abdominal ultrasound results with the patient.  1. Coarse heterogeneous liver parenchyma corresponding to history of liver disease. No focal hepatic abnormality 2. Status post cholecystectomy. Normal common bile duct diameter but mild intra hepatic biliary dilatation question due to postsurgical change, correlate with LFTs. -LFTs to be checked with upcoming labs  -repeat ultrasound in one year.     

## 2022-12-02 DIAGNOSIS — H353211 Exudative age-related macular degeneration, right eye, with active choroidal neovascularization: Secondary | ICD-10-CM | POA: Diagnosis not present

## 2022-12-31 ENCOUNTER — Other Ambulatory Visit: Payer: Self-pay

## 2022-12-31 DIAGNOSIS — E876 Hypokalemia: Secondary | ICD-10-CM

## 2022-12-31 MED ORDER — POTASSIUM CHLORIDE ER 20 MEQ PO TBCR
20.0000 meq | EXTENDED_RELEASE_TABLET | Freq: Every day | ORAL | 2 refills | Status: DC
Start: 2022-12-31 — End: 2023-08-20

## 2023-01-07 DIAGNOSIS — H353221 Exudative age-related macular degeneration, left eye, with active choroidal neovascularization: Secondary | ICD-10-CM | POA: Diagnosis not present

## 2023-01-14 ENCOUNTER — Ambulatory Visit (INDEPENDENT_AMBULATORY_CARE_PROVIDER_SITE_OTHER): Payer: Medicare HMO | Admitting: Family Medicine

## 2023-01-14 ENCOUNTER — Encounter: Payer: Self-pay | Admitting: Family Medicine

## 2023-01-14 ENCOUNTER — Telehealth: Payer: Self-pay

## 2023-01-14 DIAGNOSIS — M064 Inflammatory polyarthropathy: Secondary | ICD-10-CM

## 2023-01-14 DIAGNOSIS — E782 Mixed hyperlipidemia: Secondary | ICD-10-CM

## 2023-01-14 DIAGNOSIS — M158 Other polyosteoarthritis: Secondary | ICD-10-CM

## 2023-01-14 DIAGNOSIS — R7309 Other abnormal glucose: Secondary | ICD-10-CM

## 2023-01-14 DIAGNOSIS — E039 Hypothyroidism, unspecified: Secondary | ICD-10-CM

## 2023-01-14 DIAGNOSIS — K760 Fatty (change of) liver, not elsewhere classified: Secondary | ICD-10-CM

## 2023-01-14 DIAGNOSIS — I10 Essential (primary) hypertension: Secondary | ICD-10-CM

## 2023-01-14 MED ORDER — OXYCODONE HCL 10 MG PO TABS
10.0000 mg | ORAL_TABLET | Freq: Two times a day (BID) | ORAL | 0 refills | Status: AC | PRN
Start: 2023-01-14 — End: ?

## 2023-01-14 MED ORDER — OXYCODONE ER 18 MG PO C12A
18.0000 mg | EXTENDED_RELEASE_CAPSULE | Freq: Two times a day (BID) | ORAL | 0 refills | Status: AC
Start: 2023-01-14 — End: ?

## 2023-01-14 MED ORDER — OXYCODONE HCL 10 MG PO TABS
10.0000 mg | ORAL_TABLET | Freq: Two times a day (BID) | ORAL | 0 refills | Status: DC | PRN
Start: 2023-01-14 — End: 2023-01-14

## 2023-01-14 MED ORDER — OXYCODONE ER 18 MG PO C12A
18.0000 mg | EXTENDED_RELEASE_CAPSULE | Freq: Two times a day (BID) | ORAL | 0 refills | Status: DC
Start: 2023-02-14 — End: 2023-01-14

## 2023-01-14 MED ORDER — OXYCODONE ER 18 MG PO C12A
18.0000 mg | EXTENDED_RELEASE_CAPSULE | Freq: Two times a day (BID) | ORAL | 0 refills | Status: AC
Start: 2023-02-14 — End: ?

## 2023-01-14 NOTE — Progress Notes (Signed)
   Established Patient Office Visit  Subjective   Patient ID: Martha Peterson, female    DOB: Oct 16, 1940  Age: 82 y.o. MRN: [id number]  Chief Complaint  Patient presents with   Pain Management    HPI Martha Peterson is a 82 y.o. female presenting today for follow up of pain management.  She saw Martha Peterson for several years for management of chronic pain due to inflammatory polyarthritis and other osteoarthritis.  She is limited in how much acetaminophen and ibuprofen she can take due to nonalcoholic fatty liver disease and chronic kidney disease.  Currently on a regimen of oxycodone ER 18 mg twice daily.  At last visit, Martha Peterson also increased rescue medication to oxycodone HCl 10 mg up to twice daily as needed.  Patient states that this increase  ROS Negative unless otherwise noted in HPI   Objective:     BP 134/76 (BP Location: Left Arm, Patient Position: Sitting, Cuff Size: Normal)   Pulse 90   Resp 18   Ht 5\' 3"  (1.6 m)   Wt 179 lb (81.2 kg)   SpO2 97%   BMI 31.71 kg/m   Physical Exam Constitutional:      General: She is not in acute distress.    Appearance: Normal appearance.  HENT:     Head: Normocephalic and atraumatic.  Cardiovascular:     Rate and Rhythm: Normal rate and regular rhythm.     Heart sounds: No murmur heard.    No friction rub. No gallop.  Pulmonary:     Effort: Pulmonary effort is normal. No respiratory distress.     Breath sounds: No wheezing, rhonchi or rales.  Skin:    General: Skin is warm and dry.  Neurological:     Mental Status: She is alert and oriented to person, place, and time.      Assessment & Plan:  Inflammatory polyarthritis (HCC) -     oxyCODONE ER; Take 18 mg by mouth 2 (two) times daily.  Dispense: 60 capsule; Refill: 0 -     oxyCODONE HCl; Take 1 tablet (10 mg total) by mouth 2 (two) times daily as needed.  Dispense: 45 tablet; Refill: 0 -     oxyCODONE ER; Take 18 mg by mouth 2 (two) times daily.  Dispense: 60  capsule; Refill: 0 -     oxyCODONE HCl; Take 1 tablet (10 mg total) by mouth 2 (two) times daily as needed.  Dispense: 45 tablet; Refill: 0  Other osteoarthritis involving multiple joints -     oxyCODONE ER; Take 18 mg by mouth 2 (two) times daily.  Dispense: 60 capsule; Refill: 0 -     oxyCODONE HCl; Take 1 tablet (10 mg total) by mouth 2 (two) times daily as needed.  Dispense: 45 tablet; Refill: 0 -     oxyCODONE ER; Take 18 mg by mouth 2 (two) times daily.  Dispense: 60 capsule; Refill: 0 -     oxyCODONE HCl; Take 1 tablet (10 mg total) by mouth 2 (two) times daily as needed.  Dispense: 45 tablet; Refill: 0  PDMP reviewed, unintentional overdose risk score below average at 020. Updated controlled substance contract today. Sent two 30 day prescriptions.   Return in about 2 months (around 03/16/2023) for follow-up for pain management, HTN, thyroid, fasting blood work 1 week before.    Martha Quitter, PA

## 2023-01-14 NOTE — Telephone Encounter (Signed)
Lab orders have been placed for [facility]

## 2023-01-14 NOTE — Telephone Encounter (Signed)
Pt is requesting to go to [facility] in [place] to have labs drawn week before appt.

## 2023-02-08 ENCOUNTER — Other Ambulatory Visit: Payer: Self-pay | Admitting: Nurse Practitioner

## 2023-02-08 DIAGNOSIS — J309 Allergic rhinitis, unspecified: Secondary | ICD-10-CM

## 2023-02-08 DIAGNOSIS — I6523 Occlusion and stenosis of bilateral carotid arteries: Secondary | ICD-10-CM

## 2023-02-24 DIAGNOSIS — Z961 Presence of intraocular lens: Secondary | ICD-10-CM | POA: Diagnosis not present

## 2023-02-24 DIAGNOSIS — H353211 Exudative age-related macular degeneration, right eye, with active choroidal neovascularization: Secondary | ICD-10-CM | POA: Diagnosis not present

## 2023-02-24 DIAGNOSIS — H353221 Exudative age-related macular degeneration, left eye, with active choroidal neovascularization: Secondary | ICD-10-CM | POA: Diagnosis not present

## 2023-02-24 DIAGNOSIS — Z0100 Encounter for examination of eyes and vision without abnormal findings: Secondary | ICD-10-CM | POA: Diagnosis not present

## 2023-03-01 ENCOUNTER — Other Ambulatory Visit: Payer: Self-pay | Admitting: Nurse Practitioner

## 2023-03-01 DIAGNOSIS — M064 Inflammatory polyarthropathy: Secondary | ICD-10-CM

## 2023-03-01 DIAGNOSIS — M1612 Unilateral primary osteoarthritis, left hip: Secondary | ICD-10-CM

## 2023-03-03 ENCOUNTER — Other Ambulatory Visit: Payer: Self-pay | Admitting: Nurse Practitioner

## 2023-03-03 DIAGNOSIS — E039 Hypothyroidism, unspecified: Secondary | ICD-10-CM

## 2023-03-03 MED ORDER — LEVOTHYROXINE SODIUM 137 MCG PO TABS
137.0000 ug | ORAL_TABLET | Freq: Every day | ORAL | 3 refills | Status: DC
Start: 2023-03-03 — End: 2023-12-23

## 2023-03-16 ENCOUNTER — Encounter: Payer: Self-pay | Admitting: Family Medicine

## 2023-03-16 ENCOUNTER — Ambulatory Visit (INDEPENDENT_AMBULATORY_CARE_PROVIDER_SITE_OTHER): Payer: Medicare HMO | Admitting: Family Medicine

## 2023-03-16 VITALS — BP 113/70 | HR 80 | Resp 18 | Ht 63.0 in | Wt 173.0 lb

## 2023-03-16 DIAGNOSIS — I10 Essential (primary) hypertension: Secondary | ICD-10-CM | POA: Diagnosis not present

## 2023-03-16 DIAGNOSIS — I6523 Occlusion and stenosis of bilateral carotid arteries: Secondary | ICD-10-CM | POA: Diagnosis not present

## 2023-03-16 DIAGNOSIS — M064 Inflammatory polyarthropathy: Secondary | ICD-10-CM

## 2023-03-16 DIAGNOSIS — E039 Hypothyroidism, unspecified: Secondary | ICD-10-CM | POA: Diagnosis not present

## 2023-03-16 DIAGNOSIS — K760 Fatty (change of) liver, not elsewhere classified: Secondary | ICD-10-CM | POA: Diagnosis not present

## 2023-03-16 DIAGNOSIS — R7309 Other abnormal glucose: Secondary | ICD-10-CM

## 2023-03-16 DIAGNOSIS — M158 Other polyosteoarthritis: Secondary | ICD-10-CM

## 2023-03-16 DIAGNOSIS — E782 Mixed hyperlipidemia: Secondary | ICD-10-CM | POA: Diagnosis not present

## 2023-03-16 DIAGNOSIS — F321 Major depressive disorder, single episode, moderate: Secondary | ICD-10-CM

## 2023-03-16 DIAGNOSIS — Z887 Allergy status to serum and vaccine status: Secondary | ICD-10-CM | POA: Insufficient documentation

## 2023-03-16 MED ORDER — OXYCODONE HCL 10 MG PO TABS
10.0000 mg | ORAL_TABLET | Freq: Two times a day (BID) | ORAL | 0 refills | Status: DC | PRN
Start: 2023-03-16 — End: 2023-05-19

## 2023-03-16 MED ORDER — OXYCODONE HCL 10 MG PO TABS: 10.0000 mg | ORAL_TABLET | Freq: Two times a day (BID) | ORAL | 0 refills | Status: DC | PRN

## 2023-03-16 MED ORDER — OXYCODONE ER 18 MG PO C12A: 18.0000 mg | EXTENDED_RELEASE_CAPSULE | Freq: Two times a day (BID) | ORAL | 0 refills | Status: DC

## 2023-03-16 MED ORDER — MIRTAZAPINE 15 MG PO TABS
ORAL_TABLET | ORAL | 3 refills | Status: DC
Start: 2023-03-16 — End: 2023-05-17

## 2023-03-16 MED ORDER — LOSARTAN POTASSIUM-HCTZ 100-25 MG PO TABS
1.0000 | ORAL_TABLET | Freq: Every day | ORAL | 3 refills | Status: DC
Start: 2023-03-16 — End: 2023-07-19

## 2023-03-16 MED ORDER — CARVEDILOL 6.25 MG PO TABS: 6.2500 mg | ORAL_TABLET | Freq: Two times a day (BID) | ORAL | 0 refills | Status: DC

## 2023-03-16 NOTE — Progress Notes (Signed)
Established Patient Office Visit  Subjective   Patient ID: Martha Peterson, female    DOB: 25-Mar-1941  Age: 82 y.o. MRN: [id number]  Chief Complaint  Patient presents with   Hypertension   Hypothyroidism   Pain Management    HPI Martha Peterson is a 82 y.o. female presenting today for follow up of hypertension, hypothyroidism, pain management. Hypertension: Patient here for follow-up of elevated blood pressure.  Pt denies chest pain, SOB, dizziness, edema, syncope, fatigue or heart palpitations. Taking losartan-Hydrochlorothiazide, carvedilol, reports excellent compliance with treatment. Denies side effects. Hypothyroidism: Taking levothyroxine 137 mcg regularly in the AM away from food and vitamins. Denies fatigue, weight changes, heat/cold intolerance, skin/hair changes, bowel changes, CVS symptoms. Pain management: She saw Vincent Gros NP for several years for management of chronic pain due to inflammatory polyarthritis and other osteoarthritis.  She has chronic kidney disease and cannot take ibuprofen, she also does not take Tylenol because of her history of fatty liver disease.  Currently on a regimen of oxycodone ER 18 mg twice daily as well as rescue medication oxycodone HCl 10 mg up to twice daily as needed.  She states that there are some days that she does not need both doses of the rescue short acting oxycodone, but there are days where she exceeds that recommended dosing.  Outpatient Medications Prior to Visit  Medication Sig   allopurinol (ZYLOPRIM) 100 MG tablet TAKE 2 TABLETS EVERY DAY   apixaban (ELIQUIS) 5 MG TABS tablet Take 1 tablet (5 mg total) by mouth 2 (two) times daily.   ascorbic acid (VITAMIN C) 500 MG tablet Take 500 mg by mouth daily.   aspirin EC 81 MG tablet Take 81 mg by mouth daily.   atorvastatin (LIPITOR) 10 MG tablet TAKE 1 TABLET EVERY DAY AT 6PM   cholecalciferol (VITAMIN D) 400 UNITS TABS tablet Take 1,000 Units by mouth.   diclofenac sodium  (VOLTAREN) 1 % GEL Apply 3 grams to 3 large joints, up to 3 times daily as needed.   fluticasone (FLONASE) 50 MCG/ACT nasal spray Place 2 sprays into both nostrils daily.   Ginger, Zingiber officinalis, (GINGER PO) Take by mouth.   IBU 600 MG tablet TAKE 1 TABLET EVERY 8 HOURS AS NEEDED   levocetirizine (XYZAL) 5 MG tablet TAKE 1 TABLET EVERY EVENING   levothyroxine (SYNTHROID) 137 MCG tablet Take 1 tablet (137 mcg total) by mouth daily before breakfast.   magnesium oxide (MAG-OX) 400 MG tablet Take 400 mg by mouth daily.    Misc Natural Products (TART CHERRY ADVANCED PO) Take by mouth daily.   Multiple Vitamins-Minerals (PRESERVISION AREDS 2 PO) Take by mouth 2 (two) times daily.   omega-3 acid ethyl esters (LOVAZA) 1 G capsule Take 1 g by mouth 2 (two) times daily.   Potassium Chloride ER 20 MEQ TBCR Take 1 tablet (20 mEq total) by mouth daily.   potassium chloride SA (KLOR-CON M) 20 MEQ tablet Take 20 mEq by mouth daily.   predniSONE (DELTASONE) 5 MG tablet TAKE 2 TABLETS EVERY DAY WITH BREAKFAST   tiZANidine (ZANAFLEX) 4 MG tablet TAKE 1 TABLET TWICE DAILY   torsemide (DEMADEX) 20 MG tablet TAKE 1 TABLET EVERY DAY (DISCONTINUE FUROSEMIDE)   torsemide 40 MG TABS Take 40 mg by mouth daily.   TURMERIC PO Take by mouth daily.   vitamin E 400 UNIT capsule Take 800 Units by mouth daily.    [DISCONTINUED] carvedilol (COREG) 6.25 MG tablet TAKE 1 TABLET TWICE DAILY WITH  MEALS   [DISCONTINUED] losartan-hydrochlorothiazide (HYZAAR) 100-25 MG tablet TAKE 1 TABLET EVERY DAY   [DISCONTINUED] mirtazapine (REMERON) 15 MG tablet TAKE 1 AND 1/2 TABLETS EVERY EVENING   [DISCONTINUED] oxyCODONE ER 18 MG C12A Take 18 mg by mouth 2 (two) times daily.   [DISCONTINUED] oxyCODONE ER 18 MG C12A Take 18 mg by mouth 2 (two) times daily.   [DISCONTINUED] Oxycodone HCl 10 MG TABS Take 1 tablet (10 mg total) by mouth 2 (two) times daily as needed.   [DISCONTINUED] Oxycodone HCl 10 MG TABS Take 1 tablet (10 mg total)  by mouth 2 (two) times daily as needed.   No facility-administered medications prior to visit.    ROS Negative unless otherwise noted in HPI   Objective:     BP 113/70 (BP Location: Left Arm, Patient Position: Sitting, Cuff Size: Large)   Pulse 80   Resp 18   Ht 5\' 3"  (1.6 m)   Wt 173 lb (78.5 kg)   SpO2 96%   BMI 30.65 kg/m   Physical Exam Constitutional:      General: She is not in acute distress.    Appearance: Normal appearance.  HENT:     Head: Normocephalic and atraumatic.  Cardiovascular:     Rate and Rhythm: Normal rate and regular rhythm.     Heart sounds: No murmur heard.    No friction rub. No gallop.  Pulmonary:     Effort: Pulmonary effort is normal. No respiratory distress.     Breath sounds: No wheezing, rhonchi or rales.  Skin:    General: Skin is warm and dry.  Neurological:     Mental Status: She is alert and oriented to person, place, and time.     Assessment & Plan:  Essential hypertension Assessment & Plan: BP goal <130/80.  Stable, at goal.  Continue losartan-HCTZ 100-25 mg daily, carvedilol 6.25 mg twice daily.  Will continue to monitor.  Orders: -     Losartan Potassium-HCTZ; Take 1 tablet by mouth daily.  Dispense: 90 tablet; Refill: 3 -     Comprehensive metabolic panel  Hypothyroidism (acquired) Assessment & Plan: Last TSH within normal limits.  Continue levothyroxine 137 mcg daily.  Will continue to monitor.  Orders: -     T4, free -     TSH  Inflammatory polyarthritis (HCC) Assessment & Plan: Taking over pain management from previous PCP.  We discussed that given the high amount of opioids that she is currently taking, it would be beneficial to consult pain management or physical medicine to explore all pain management options.  Patient is very hesitant to go to a specialist, she and her daughter do not see a need to do so when her regimen is working for her.  If taking both long-acting and 2 doses of short acting as prescribed,  daily MME 84.  This is approaching [place] policy recommended dose of 90 MME per patient.  At next visit, we will discuss optimizing Tylenol therapy, even with history of nonalcoholic fatty liver disease she can take up to 2 g (500 mg every 6 hours) daily.  She should also be optimizing topical medication management of arthritis like Voltaren as this will not impact her kidney function.  She was previously being followed by rheumatology but did not return as they recommended.  At next appointment, we will need to collect urine drug screen.  Will also discuss gradually decreasing opioids starting with the short acting by 2.5 mg every 1 to 2  weeks.  Orders: -     oxyCODONE ER; Take 18 mg by mouth 2 (two) times daily.  Dispense: 60 capsule; Refill: 0 -     oxyCODONE HCl; Take 1 tablet (10 mg total) by mouth 2 (two) times daily as needed.  Dispense: 45 tablet; Refill: 0 -     oxyCODONE ER; Take 18 mg by mouth 2 (two) times daily.  Dispense: 60 capsule; Refill: 0 -     oxyCODONE HCl; Take 1 tablet (10 mg total) by mouth 2 (two) times daily as needed.  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  Dispense: 180 tablet; Refill: 0  Current moderate episode of major depressive disorder, unspecified whether recurrent (HCC) -     Mirtazapine; TAKE 1 AND 1/2 TABLETS EVERY EVENING  Dispense: 135 tablet; Refill: 3  Other osteoarthritis involving multiple joints -     oxyCODONE ER; Take 18 mg by mouth 2 (two) times daily.  Dispense: 60 capsule; Refill: 0 -     oxyCODONE HCl; Take 1 tablet (10 mg total) by mouth 2 (two) times daily as needed.  Dispense: 45 tablet; Refill: 0  Hyperlipidemia, mixed -     Lipid panel  Elevated glucose -     Hemoglobin A1c  Nonalcoholic fatty liver disease -     Comprehensive metabolic panel    Return in about 2 months (around  05/16/2023) for follow-up for pain management, fasting labs at [facility] prior to visit.   I spent 70 minutes on the day of the encounter to include pre-visit record review, face-to-face time with the patient, and post visit ordering of test tests and medications and consulting supervising physician regarding her care.  Melida Quitter, PA

## 2023-03-16 NOTE — Assessment & Plan Note (Addendum)
Taking over pain management from previous PCP.  We discussed that given the high amount of opioids that she is currently taking, it would be beneficial to consult pain management or physical medicine to explore all pain management options.  Patient is very hesitant to go to a specialist, she and her daughter do not see a need to do so when her regimen is working for her.  If taking both long-acting and 2 doses of short acting as prescribed, daily MME 84.  This is approaching [place] policy recommended dose of 90 MME per patient.  At next visit, we will discuss optimizing Tylenol therapy, even with history of nonalcoholic fatty liver disease she can take up to 2 g (500 mg every 6 hours) daily.  She should also be optimizing topical medication management of arthritis like Voltaren as this will not impact her kidney function.  She was previously being followed by rheumatology but did not return as they recommended.  At next appointment, we will need to collect urine drug screen.  Will also discuss gradually decreasing opioids starting with the short acting by 2.5 mg every 1 to 2 weeks.

## 2023-03-16 NOTE — Assessment & Plan Note (Signed)
Last TSH within normal limits.  Continue levothyroxine 137 mcg daily.  Will continue to monitor.

## 2023-03-16 NOTE — Assessment & Plan Note (Signed)
BP goal <130/80.  Stable, at goal.  Continue losartan-HCTZ 100-25 mg daily, carvedilol 6.25 mg twice daily.  Will continue to monitor.

## 2023-04-06 DIAGNOSIS — Z1231 Encounter for screening mammogram for malignant neoplasm of breast: Secondary | ICD-10-CM | POA: Diagnosis not present

## 2023-04-15 DIAGNOSIS — H353211 Exudative age-related macular degeneration, right eye, with active choroidal neovascularization: Secondary | ICD-10-CM | POA: Diagnosis not present

## 2023-04-16 ENCOUNTER — Other Ambulatory Visit: Payer: Self-pay | Admitting: Nurse Practitioner

## 2023-04-16 MED ORDER — ATORVASTATIN CALCIUM 10 MG PO TABS: 10.0000 mg | ORAL_TABLET | Freq: Every day | ORAL | 10 refills | Status: DC

## 2023-04-22 ENCOUNTER — Other Ambulatory Visit: Payer: Self-pay | Admitting: Family Medicine

## 2023-04-22 DIAGNOSIS — J309 Allergic rhinitis, unspecified: Secondary | ICD-10-CM

## 2023-04-22 DIAGNOSIS — I6523 Occlusion and stenosis of bilateral carotid arteries: Secondary | ICD-10-CM

## 2023-05-05 DIAGNOSIS — H353221 Exudative age-related macular degeneration, left eye, with active choroidal neovascularization: Secondary | ICD-10-CM | POA: Diagnosis not present

## 2023-05-06 ENCOUNTER — Other Ambulatory Visit: Payer: Self-pay | Admitting: Nurse Practitioner

## 2023-05-06 DIAGNOSIS — M791 Myalgia, unspecified site: Secondary | ICD-10-CM

## 2023-05-06 MED ORDER — TIZANIDINE HCL 4 MG PO TABS: 4.0000 mg | ORAL_TABLET | Freq: Two times a day (BID) | ORAL | 3 refills | Status: DC

## 2023-05-10 DIAGNOSIS — R7309 Other abnormal glucose: Secondary | ICD-10-CM | POA: Diagnosis not present

## 2023-05-10 DIAGNOSIS — K760 Fatty (change of) liver, not elsewhere classified: Secondary | ICD-10-CM | POA: Diagnosis not present

## 2023-05-10 DIAGNOSIS — E782 Mixed hyperlipidemia: Secondary | ICD-10-CM | POA: Diagnosis not present

## 2023-05-10 DIAGNOSIS — E039 Hypothyroidism, unspecified: Secondary | ICD-10-CM | POA: Diagnosis not present

## 2023-05-10 DIAGNOSIS — I10 Essential (primary) hypertension: Secondary | ICD-10-CM | POA: Diagnosis not present

## 2023-05-11 ENCOUNTER — Ambulatory Visit: Payer: Medicare HMO

## 2023-05-11 ENCOUNTER — Telehealth: Payer: Self-pay | Admitting: *Deleted

## 2023-05-11 DIAGNOSIS — Z0000 Encounter for general adult medical examination without abnormal findings: Secondary | ICD-10-CM | POA: Diagnosis not present

## 2023-05-11 LAB — COMPREHENSIVE METABOLIC PANEL
ALT: 17 [IU]/L (ref 0–32)
AST: 38 [IU]/L (ref 0–40)
Albumin: 4.3 g/dL (ref 3.7–4.7)
Alkaline Phosphatase: 151 [IU]/L — ABNORMAL HIGH (ref 44–121)
BUN/Creatinine Ratio: 20 (ref 12–28)
BUN: 23 mg/dL (ref 8–27)
Bilirubin Total: 0.8 mg/dL (ref 0.0–1.2)
CO2: 27 mmol/L (ref 20–29)
Calcium: 9.5 mg/dL (ref 8.7–10.3)
Chloride: 98 mmol/L (ref 96–106)
Creatinine, Ser: 1.15 mg/dL — ABNORMAL HIGH (ref 0.57–1.00)
Globulin, Total: 3.2 g/dL (ref 1.5–4.5)
Glucose: 108 mg/dL — ABNORMAL HIGH (ref 70–99)
Potassium: 4.5 mmol/L (ref 3.5–5.2)
Sodium: 138 mmol/L (ref 134–144)
Total Protein: 7.5 g/dL (ref 6.0–8.5)
eGFR: 47 mL/min/{1.73_m2} — ABNORMAL LOW (ref >59)

## 2023-05-11 LAB — TSH: TSH: 13.1 u[IU]/mL — ABNORMAL HIGH (ref 0.450–4.500)

## 2023-05-11 LAB — LIPID PANEL
Chol/HDL Ratio: 2 {ratio} (ref 0.0–4.4)
Cholesterol, Total: 106 mg/dL (ref 100–199)
HDL: 53 mg/dL (ref >39)
LDL Chol Calc (NIH): 37 mg/dL (ref 0–99)
Triglycerides: 78 mg/dL (ref 0–149)
VLDL Cholesterol Cal: 16 mg/dL (ref 5–40)

## 2023-05-11 LAB — HEMOGLOBIN A1C
Est. average glucose Bld gHb Est-mCnc: 117 mg/dL
Hgb A1c MFr Bld: 5.7 % — ABNORMAL HIGH (ref 4.8–5.6)

## 2023-05-11 LAB — T4, FREE: Free T4: 1.42 ng/dL (ref 0.82–1.77)

## 2023-05-11 NOTE — Telephone Encounter (Signed)
E[employer] agent calling to say that pt missed a call from [place]. I did not see any messages and will send to the team to see if someone tried to reach her.

## 2023-05-11 NOTE — Patient Instructions (Signed)
Martha Peterson , Thank you for taking time to come for your Medicare Wellness Visit. I appreciate your ongoing commitment to your health goals. Please review the following plan we discussed and let me know if I can assist you in the future.   Referrals/Orders/Follow-Ups/Clinician Recommendations: none  This is a list of the screening recommended for you and due dates:  Health Maintenance  Topic Date Due   DTaP/Tdap/Td vaccine (1 - Tdap) Never done   COVID-19 Vaccine (4 - 2023-24 season) 02/14/2023   Flu Shot  09/13/2023*   Medicare Annual Wellness Visit  05/10/2024   Pneumonia Vaccine  Completed   DEXA scan (bone density measurement)  Completed   Zoster (Shingles) Vaccine  Completed   HPV Vaccine  Aged Out  *Topic was postponed. The date shown is not the original due date.    Advanced directives: (Copy Requested) Please bring a copy of your health care power of attorney and living will to the office to be added to your chart at your convenience.  Next Medicare Annual Wellness Visit scheduled for next year: No.schedule is not open for next year  Insert Preventive Care attachment Insert FALL PREVENTION attachment if needed

## 2023-05-11 NOTE — Telephone Encounter (Signed)
Patient has an AWV scheduled for today. That missed call may have been in reference to this appointment.

## 2023-05-11 NOTE — Progress Notes (Signed)
Subjective:   Martha Peterson is a 82 y.o. female who presents for Medicare Annual (Subsequent) preventive examination.  Visit Complete: Virtual I connected with  Martha Peterson on 05/11/23 by a audio enabled telemedicine application and verified that I am speaking with the correct person using two identifiers.  Patient Location: Home  Provider Location: Office/Clinic  I discussed the limitations of evaluation and management by telemedicine. The patient expressed understanding and agreed to proceed.  Vital Signs: Because this visit was a virtual/telehealth visit, some criteria may be missing or patient reported. Any vitals not documented were not able to be obtained and vitals that have been documented are patient reported.    Cardiac Risk Factors include: advanced age (>26men, >60 women);dyslipidemia;hypertension     Objective:    Today's Vitals   There is no height or weight on file to calculate BMI.     05/11/2023   10:04 AM 10/20/2019    9:07 AM 07/26/2019    9:20 AM 07/20/2019   10:22 AM 07/17/2019    9:01 AM 10/28/2018    8:53 AM 10/28/2018    8:43 AM  Advanced Directives  Does Patient Have a Medical Advance Directive? Yes No No No No No No  Type of Estate agent of [place];Living will        Copy of Healthcare Power of Attorney in Chart? No - copy requested        Would patient like information on creating a medical advance directive?  No - Patient declined No - Patient declined No - Patient declined Yes (MAU/Ambulatory/Procedural Areas - Information given) No - Patient declined No - Patient declined    Current Medications (verified) Outpatient Encounter Medications as of 05/11/2023  Medication Sig   apixaban (ELIQUIS) 5 MG TABS tablet Take 1 tablet (5 mg total) by mouth 2 (two) times daily.   ascorbic acid (VITAMIN C) 500 MG tablet Take 500 mg by mouth daily.   aspirin EC 81 MG tablet Take 81 mg by mouth daily.   atorvastatin (LIPITOR) 10  MG tablet Take 1 tablet (10 mg total) by mouth daily.   carvedilol (COREG) 6.25 MG tablet TAKE 1 TABLET TWICE DAILY WITH MEALS   cholecalciferol (VITAMIN D) 400 UNITS TABS tablet Take 1,000 Units by mouth.   diclofenac sodium (VOLTAREN) 1 % GEL Apply 3 grams to 3 large joints, up to 3 times daily as needed.   Ginger, Zingiber officinalis, (GINGER PO) Take by mouth.   IBU 600 MG tablet TAKE 1 TABLET EVERY 8 HOURS AS NEEDED   levocetirizine (XYZAL) 5 MG tablet TAKE 1 TABLET EVERY EVENING   levothyroxine (SYNTHROID) 137 MCG tablet Take 1 tablet (137 mcg total) by mouth daily before breakfast.   losartan-hydrochlorothiazide (HYZAAR) 100-25 MG tablet Take 1 tablet by mouth daily.   magnesium oxide (MAG-OX) 400 MG tablet Take 400 mg by mouth daily.    mirtazapine (REMERON) 15 MG tablet TAKE 1 AND 1/2 TABLETS EVERY EVENING   Misc Natural Products (TART CHERRY ADVANCED PO) Take by mouth daily.   Multiple Vitamins-Minerals (PRESERVISION AREDS 2 PO) Take by mouth 2 (two) times daily.   omega-3 acid ethyl esters (LOVAZA) 1 G capsule Take 1 g by mouth 2 (two) times daily.   oxyCODONE ER 18 MG C12A Take 18 mg by mouth 2 (two) times daily.   oxyCODONE ER 18 MG C12A Take 18 mg by mouth 2 (two) times daily.   Oxycodone HCl 10 MG TABS Take 1  tablet (10 mg total) by mouth 2 (two) times daily as needed.   Oxycodone HCl 10 MG TABS Take 1 tablet (10 mg total) by mouth 2 (two) times daily as needed.   Potassium Chloride ER 20 MEQ TBCR Take 1 tablet (20 mEq total) by mouth daily.   potassium chloride SA (KLOR-CON M) 20 MEQ tablet Take 20 mEq by mouth daily.   predniSONE (DELTASONE) 5 MG tablet TAKE 2 TABLETS EVERY DAY WITH BREAKFAST   tiZANidine (ZANAFLEX) 4 MG tablet Take 1 tablet (4 mg total) by mouth 2 (two) times daily.   torsemide (DEMADEX) 20 MG tablet TAKE 1 TABLET EVERY DAY (DISCONTINUE FUROSEMIDE)   TURMERIC PO Take by mouth daily.   vitamin E 400 UNIT capsule Take 800 Units by mouth daily.    allopurinol  (ZYLOPRIM) 100 MG tablet TAKE 2 TABLETS EVERY DAY (Patient not taking: Reported on 05/11/2023)   fluticasone (FLONASE) 50 MCG/ACT nasal spray Place 2 sprays into both nostrils daily. (Patient not taking: Reported on 05/11/2023)   torsemide 40 MG TABS Take 40 mg by mouth daily. (Patient not taking: Reported on 05/11/2023)   No facility-administered encounter medications on file as of 05/11/2023.    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   Microhematuria    Mild mitral regurgitation    Mild tricuspid regurgitation    Neutropenia (HCC) 02/14/2018   Osteopenia    Pulmonary fibrosis (HCC)    Urinary incontinence    Past Surgical History:  Procedure Laterality Date   ABDOMINAL HYSTERECTOMY     ANKLE SURGERY Left    APPENDECTOMY     CAROTID PTA/STENT INTERVENTION N/A 07/17/2019   Procedure: CAROTID PTA/STENT INTERVENTION;  Surgeon: Annice Needy, MD;  Location: [facility] INVASIVE CV LAB;  Service: Cardiovascular;  Laterality: N/A;   CAROTID-SUBCLAVIAN BYPASS GRAFT N/A 07/26/2019   Procedure: BYPASS GRAFT CAROTID-SUBCLAVIAN ( CAROTID TO CAROTID BYPASS);  Surgeon: Annice Needy, MD;  Location: [facility] ORS;  Service: Vascular;  Laterality: N/A;   CHOLECYSTECTOMY     COLONOSCOPY WITH PROPOFOL N/A 12/04/2014   Procedure: COLONOSCOPY WITH PROPOFOL;  Surgeon: Christena Deem, MD;  Location: [facility] ENDOSCOPY;  Service: Endoscopy;  Laterality: N/A;   FRACTURE SURGERY     Femur Fx; LT Ankle Pinning   JOINT REPLACEMENT     RT TKR   Patial Thyroid Resection     Family History  Problem Relation Age of Onset   Heart  Problems Mother    Heart Problems Father    Diabetes Father    Lupus Sister    Thyroid disease Sister    Heart Problems Sister    Hypertension Son    Diabetes Son        borderline    Arthritis Daughter        in the left knee    Diabetes Daughter    Neuropathy Daughter    Depression Daughter    Anxiety disorder Daughter    Social History   Socioeconomic History   Marital status: Widowed    Spouse name: Fayrene Fearing   Number of children:  Not on file   Years of education: Not on file   Highest education level: Not on file  Occupational History   Not on file  Tobacco Use   Smoking status: Former    Types: Cigarettes    Passive exposure: Never   Smokeless tobacco: Never  Vaping Use   Vaping status: Never Used  Substance and Sexual Activity   Alcohol use: No   Drug use: Yes    Types: Oxycodone   Sexual activity: Not Currently  Other Topics Concern   Not on file  Social History Narrative   Lives at home with husband in private residence   Social Determinants of Health   Financial Resource Strain: Low Risk  (05/11/2023)   Overall Financial Resource Strain (CARDIA)    Difficulty of Paying Living Expenses: Not hard at all  Food Insecurity: No Food Insecurity (05/11/2023)   Hunger Vital Sign    Worried About Running Out of Food in the Last Year: Never true    Ran Out of Food in the Last Year: Never true  Transportation Needs: No Transportation Needs (05/11/2023)   PRAPARE - Administrator, Civil Service (Medical): No    Lack of Transportation (Non-Medical): No  Physical Activity: Insufficiently Active (05/11/2023)   Exercise Vital Sign    Days of Exercise per Week: 3 days    Minutes of Exercise per Session: 30 min  Stress: No Stress Concern Present (05/11/2023)   [employer] of Occupational Health - Occupational Stress Questionnaire    Feeling of Stress : Not at all  Social Connections: Socially Isolated (05/11/2023)   Social Connection and Isolation  Panel [NHANES]    Frequency of Communication with Friends and Family: More than three times a week    Frequency of Social Gatherings with Friends and Family: Three times a week    Attends Religious Services: Never    Active Member of Clubs or Organizations: Not on file    Attends Banker Meetings: Never    Marital Status: Widowed    Tobacco Counseling Counseling given: Not Answered   Clinical Intake:  Pre-visit preparation completed: Yes  Pain : No/denies pain     Nutritional Risks: None Diabetes: No  How often do you need to have someone help you when you read instructions, pamphlets, or other written materials from your doctor or pharmacy?: 1 - Never  Interpreter Needed?: No  Information entered by :: NAllen LPN   Activities of Daily Living    05/11/2023    9:56 AM  In your present state of health, do you have any difficulty performing the following activities:  Hearing? 0  Vision? 1  Comment macular degeneration  Difficulty concentrating or making decisions? 0  Walking or climbing stairs? 1  Dressing or bathing? 0  Doing errands, shopping? 0  Preparing Food and eating ? N  Using the Toilet? N  In the past six months, have you accidently leaked urine? N  Do you have problems with loss of bowel control? N  Managing your Medications? N  Managing your Finances? N  Housekeeping or managing your Housekeeping? N    Patient Care Team: Melida Quitter, PA as PCP - General (Family Medicine) Debbe Odea, MD as PCP - Cardiology (Cardiology) Jeralyn Ruths, MD as Consulting Physician (Oncology) Pa, [facility])  Indicate any recent Medical Services you may have received from other than [facility] providers in the past year (date may be approximate).  Assessment:   This is a routine wellness examination for [place].  Hearing/Vision screen Hearing Screening - Comments:: Denies hearing issues Vision Screening - Comments::  Regular eye exams, [facility]   Goals Addressed             This Visit's Progress    Patient Stated       05/11/2023, wants better health       Depression Screen    05/11/2023   10:06 AM 03/16/2023    3:36 PM 11/11/2022    2:05 PM 09/10/2022   11:05 AM 07/06/2022    1:57 PM 05/05/2022    1:51 PM 03/02/2022    4:02 PM  PHQ 2/9 Scores  PHQ - 2 Score 0 0 0 0 0 0 0  PHQ- 9 Score 0 0 0 0 0 1 0    Fall Risk    05/11/2023   10:05 AM 07/06/2022    1:57 PM 05/05/2022    1:52 PM 06/30/2021   11:30 AM 05/30/2021    8:08 AM  Fall Risk   Falls in the past year? 0 0 0 0 0  Number falls in past yr: 0 0 0 0 0  Injury with Fall? 0 0 0 0 0  Risk for fall due to : Medication side effect      Follow up Falls prevention discussed;Falls evaluation completed   Falls evaluation completed Falls evaluation completed    MEDICARE RISK AT HOME: Medicare Risk at Home Any stairs in or around the home?: No If so, are there any without handrails?: No Home free of loose throw rugs in walkways, pet beds, electrical cords, etc?: Yes Adequate lighting in your home to reduce risk of falls?: Yes Life alert?: No Use of a cane, walker or w/c?: No Grab bars in the bathroom?: Yes Shower chair or bench in shower?: No Elevated toilet seat or a handicapped toilet?: Yes  TIMED UP AND GO:  Was the test performed?  No    Cognitive Function:    02/13/2020    2:16 PM 02/09/2019    3:07 PM 01/24/2018   11:27 AM  MMSE - Mini Mental State Exam  Orientation to time 5 5 5   Orientation to Place 5 5 5   Registration 3 3 3   Attention/ Calculation 5 5 5   Recall 3 3 3   Language- name 2 objects 2 2 2   Language- repeat 1 1 1   Language- follow 3 step command 3 3 3   Language- read & follow direction 1 1 1   Write a sentence 1 1 1   Copy design 1 1 1   Total score 30 30 30         05/11/2023   10:06 AM 05/05/2022    1:42 PM 03/31/2021   10:11 AM  6CIT Screen  What Year? 0 points 0 points 0 points  What  month? 0 points 0 points 0 points  What time? 0 points 0 points 0 points  Count back from 20 0 points 0 points 0 points  Months in reverse 0 points 0 points 0 points  Repeat phrase 0 points 0 points 0 points  Total Score 0 points 0 points 0 points    Immunizations Immunization History  Administered Date(s) Administered   Fluad Quad(high Dose 65+) 03/31/2021, 05/05/2022   Influenza Inj Mdck Quad Pf 03/18/2018, 04/14/2019, 04/16/2020   Influenza-Unspecified 05/04/2017   Moderna Sars-Covid-2 Vaccination 08/09/2019, 09/06/2019, 07/25/2020   Pneumococcal Conjugate-13 03/01/2018   Pneumococcal Polysaccharide-23 05/31/2015   Zoster Recombinant(Shingrix)  01/25/2018, 04/18/2018, 03/31/2019    TDAP status: Due, Education has been provided regarding the importance of this vaccine. Advised may receive this vaccine at local pharmacy or Health Dept. Aware to provide a copy of the vaccination record if obtained from local pharmacy or Health Dept. Verbalized acceptance and understanding.  Flu Vaccine status: Declined, Education has been provided regarding the importance of this vaccine but patient still declined. Advised may receive this vaccine at local pharmacy or Health Dept. Aware to provide a copy of the vaccination record if obtained from local pharmacy or Health Dept. Verbalized acceptance and understanding.  Pneumococcal vaccine status: Up to date  Covid-19 vaccine status: Information provided on how to obtain vaccines.   Qualifies for Shingles Vaccine? Yes   Zostavax completed Yes   Shingrix Completed?: Yes  Screening Tests Health Maintenance  Topic Date Due   DTaP/Tdap/Td (1 - Tdap) Never done   COVID-19 Vaccine (4 - 2023-24 season) 02/14/2023   INFLUENZA VACCINE  09/13/2023 (Originally 01/14/2023)   Medicare Annual Wellness (AWV)  05/10/2024   Pneumonia Vaccine 53+ Years old  Completed   DEXA SCAN  Completed   Zoster Vaccines- Shingrix  Completed   HPV VACCINES  Aged Out     Health Maintenance  Health Maintenance Due  Topic Date Due   DTaP/Tdap/Td (1 - Tdap) Never done   COVID-19 Vaccine (4 - 2023-24 season) 02/14/2023    Colorectal cancer screening: No longer required.   Mammogram status: No longer required due to age.  Bone Density status: Completed 07/03/2015.   Lung Cancer Screening: (Low Dose CT Chest recommended if Age 25-80 years, 20 pack-year currently smoking OR have quit w/in 15years.) does not qualify.   Lung Cancer Screening Referral: no  Additional Screening:  Hepatitis C Screening: does not qualify;   Vision Screening: Recommended annual ophthalmology exams for early detection of glaucoma and other disorders of the eye. Is the patient up to date with their annual eye exam?  Yes  Who is the provider or what is the name of the office in which the patient attends annual eye exams? [facility] If pt is not established with a provider, would they like to be referred to a provider to establish care? No .   Dental Screening: Recommended annual dental exams for proper oral hygiene  Diabetic Foot Exam: n/a  Community Resource Referral / Chronic Care Management: CRR required this visit?  No   CCM required this visit?  No     Plan:     I have personally reviewed and noted the following in the patient's chart:   Medical and social history Use of alcohol, tobacco or illicit drugs  Current medications and supplements including opioid prescriptions. Patient is currently taking opioid prescriptions. Information provided to patient regarding non-opioid alternatives. Patient advised to discuss non-opioid treatment plan with their provider. Functional ability and status Nutritional status Physical activity Advanced directives List of other physicians Hospitalizations, surgeries, and ER visits in previous 12 months Vitals Screenings to include cognitive, depression, and falls Referrals and appointments  In addition, I have  reviewed and discussed with patient certain preventive protocols, quality metrics, and best practice recommendations. A written personalized care plan for preventive services as well as general preventive health recommendations were provided to patient.     Barb Merino, LPN   56/21/3086   After Visit Summary: (MyChart) Due to this being a telephonic visit, the after visit summary with patients personalized plan was offered to patient via MyChart  Nurse Notes: none

## 2023-05-15 ENCOUNTER — Other Ambulatory Visit: Payer: Self-pay | Admitting: Nurse Practitioner

## 2023-05-15 DIAGNOSIS — F321 Major depressive disorder, single episode, moderate: Secondary | ICD-10-CM

## 2023-05-15 DIAGNOSIS — M19042 Primary osteoarthritis, left hand: Secondary | ICD-10-CM

## 2023-05-15 DIAGNOSIS — M064 Inflammatory polyarthropathy: Secondary | ICD-10-CM

## 2023-05-15 DIAGNOSIS — M0579 Rheumatoid arthritis with rheumatoid factor of multiple sites without organ or systems involvement: Secondary | ICD-10-CM

## 2023-05-17 MED ORDER — MIRTAZAPINE 15 MG PO TABS: ORAL_TABLET | ORAL | 3 refills | Status: DC

## 2023-05-18 ENCOUNTER — Ambulatory Visit: Payer: Medicare HMO | Admitting: Family Medicine

## 2023-05-18 NOTE — Progress Notes (Unsigned)
Established Patient Office Visit  Subjective   Patient ID: Martha Peterson, female    DOB: [birth year]  Age: 82 y.o. MRN: [id number]  No chief complaint on file.   HPI Martha Peterson is a 82 y.o. female presenting today for follow up of ***.  Outpatient Medications Prior to Visit  Medication Sig   allopurinol (ZYLOPRIM) 100 MG tablet TAKE 2 TABLETS EVERY DAY (Patient not taking: Reported on 05/11/2023)   apixaban (ELIQUIS) 5 MG TABS tablet Take 1 tablet (5 mg total) by mouth 2 (two) times daily.   ascorbic acid (VITAMIN C) 500 MG tablet Take 500 mg by mouth daily.   aspirin EC 81 MG tablet Take 81 mg by mouth daily.   atorvastatin (LIPITOR) 10 MG tablet Take 1 tablet (10 mg total) by mouth daily.   carvedilol (COREG) 6.25 MG tablet TAKE 1 TABLET TWICE DAILY WITH MEALS   cholecalciferol (VITAMIN D) 400 UNITS TABS tablet Take 1,000 Units by mouth.   diclofenac sodium (VOLTAREN) 1 % GEL Apply 3 grams to 3 large joints, up to 3 times daily as needed.   fluticasone (FLONASE) 50 MCG/ACT nasal spray Place 2 sprays into both nostrils daily. (Patient not taking: Reported on 05/11/2023)   Ginger, Zingiber officinalis, (GINGER PO) Take by mouth.   IBU 600 MG tablet TAKE 1 TABLET EVERY 8 HOURS AS NEEDED   levocetirizine (XYZAL) 5 MG tablet TAKE 1 TABLET EVERY EVENING   levothyroxine (SYNTHROID) 137 MCG tablet Take 1 tablet (137 mcg total) by mouth daily before breakfast.   losartan-hydrochlorothiazide (HYZAAR) 100-25 MG tablet Take 1 tablet by mouth daily.   magnesium oxide (MAG-OX) 400 MG tablet Take 400 mg by mouth daily.    mirtazapine (REMERON) 15 MG tablet TAKE 1 AND 1/2 TABLETS EVERY EVENING   Misc Natural Products (TART CHERRY ADVANCED PO) Take by mouth daily.   Multiple Vitamins-Minerals (PRESERVISION AREDS 2 PO) Take by mouth 2 (two) times daily.   omega-3 acid ethyl esters (LOVAZA) 1 G capsule Take 1 g by mouth 2 (two) times daily.   oxyCODONE ER 18 MG C12A Take 18 mg by mouth 2  (two) times daily.   oxyCODONE ER 18 MG C12A Take 18 mg by mouth 2 (two) times daily.   Oxycodone HCl 10 MG TABS Take 1 tablet (10 mg total) by mouth 2 (two) times daily as needed.   Oxycodone HCl 10 MG TABS Take 1 tablet (10 mg total) by mouth 2 (two) times daily as needed.   Potassium Chloride ER 20 MEQ TBCR Take 1 tablet (20 mEq total) by mouth daily.   potassium chloride SA (KLOR-CON M) 20 MEQ tablet Take 20 mEq by mouth daily.   predniSONE (DELTASONE) 5 MG tablet TAKE 2 TABLETS EVERY DAY WITH BREAKFAST   tiZANidine (ZANAFLEX) 4 MG tablet Take 1 tablet (4 mg total) by mouth 2 (two) times daily.   torsemide (DEMADEX) 20 MG tablet TAKE 1 TABLET EVERY DAY (DISCONTINUE FUROSEMIDE)   torsemide 40 MG TABS Take 40 mg by mouth daily. (Patient not taking: Reported on 05/11/2023)   TURMERIC PO Take by mouth daily.   vitamin E 400 UNIT capsule Take 800 Units by mouth daily.    No facility-administered medications prior to visit.    ROS Negative unless otherwise noted in HPI   Objective:     There were no vitals taken for this visit.  Physical Exam   No results found for any visits on 05/18/23.   Assessment &  Plan:  There are no diagnoses linked to this encounter.  No follow-ups on file.    Melida Quitter, PA

## 2023-05-19 ENCOUNTER — Encounter: Payer: Self-pay | Admitting: Family Medicine

## 2023-05-19 ENCOUNTER — Ambulatory Visit (INDEPENDENT_AMBULATORY_CARE_PROVIDER_SITE_OTHER): Payer: Medicare HMO | Admitting: Family Medicine

## 2023-05-19 VITALS — BP 163/83 | HR 81 | Resp 18 | Ht 63.0 in | Wt 166.0 lb

## 2023-05-19 DIAGNOSIS — I10 Essential (primary) hypertension: Secondary | ICD-10-CM

## 2023-05-19 DIAGNOSIS — Z79891 Long term (current) use of opiate analgesic: Secondary | ICD-10-CM

## 2023-05-19 DIAGNOSIS — M158 Other polyosteoarthritis: Secondary | ICD-10-CM | POA: Diagnosis not present

## 2023-05-19 DIAGNOSIS — E039 Hypothyroidism, unspecified: Secondary | ICD-10-CM | POA: Diagnosis not present

## 2023-05-19 DIAGNOSIS — N1831 Chronic kidney disease, stage 3a: Secondary | ICD-10-CM | POA: Diagnosis not present

## 2023-05-19 DIAGNOSIS — M064 Inflammatory polyarthropathy: Secondary | ICD-10-CM

## 2023-05-19 MED ORDER — OXYCODONE ER 18 MG PO C12A: 18.0000 mg | EXTENDED_RELEASE_CAPSULE | Freq: Two times a day (BID) | ORAL | 0 refills | Status: DC

## 2023-05-19 MED ORDER — NALOXONE HCL 4 MG/0.1ML NA LIQD: NASAL | 1 refills | Status: DC

## 2023-05-19 MED ORDER — OXYCODONE HCL 7.5 MG PO TABS: 7.5000 mg | ORAL_TABLET | Freq: Two times a day (BID) | ORAL | 0 refills | Status: DC | PRN

## 2023-05-19 MED ORDER — OXYCODONE ER 18 MG PO C12A
18.0000 mg | EXTENDED_RELEASE_CAPSULE | Freq: Two times a day (BID) | ORAL | 0 refills | Status: DC
Start: 2023-06-18 — End: 2023-07-20

## 2023-05-19 MED ORDER — OXYCODONE ER 18 MG PO C12A
18.0000 mg | EXTENDED_RELEASE_CAPSULE | Freq: Two times a day (BID) | ORAL | 0 refills | Status: DC
Start: 2023-05-19 — End: 2023-07-20

## 2023-05-19 NOTE — Assessment & Plan Note (Signed)
Creatinine 1.150, GFR 47.  Continue to monitor.  Recommend adequate hydration and avoiding nephrotoxic medication.

## 2023-05-19 NOTE — Progress Notes (Signed)
Established Patient Office Visit  Subjective   Patient ID: Martha Peterson, female    DOB: 1941-04-05  Age: 82 y.o. MRN: [id number]  Chief Complaint  Patient presents with   Hypertension   Hypothyroidism    HPI Martha Peterson is a 82 y.o. female presenting today for follow up of chronic pain due to osteoarthritis.  She also notes that she has had a headache today that is causing significant discomfort. 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  She was evaluated by rheumatology at one point, they did not find any evidence of rheumatoid arthritis. Currently on a regimen of oxycodone ER 18 mg twice daily as well as rescue medication oxycodone HCl 10 mg up to twice daily as needed.  She does still sometimes have breakthrough pain, she has kept a bottle of prednisone on hand and takes it as she sees fit about twice a month.  She does use some topical arthritis medication like Blue Emu and Voltaren gel.  Outpatient Medications Prior to Visit  Medication Sig   apixaban (ELIQUIS) 5 MG TABS tablet Take 1 tablet (5 mg total) by mouth 2 (two) times daily.   ascorbic acid (VITAMIN C) 500 MG tablet Take 500 mg by mouth daily.   aspirin EC 81 MG tablet Take 81 mg by mouth daily.   atorvastatin (LIPITOR) 10 MG tablet Take 1 tablet (10 mg total) by mouth daily.   carvedilol (COREG) 6.25 MG tablet TAKE 1 TABLET TWICE DAILY WITH MEALS   cholecalciferol (VITAMIN D) 400 UNITS TABS tablet Take 1,000 Units by mouth.   diclofenac sodium (VOLTAREN) 1 % GEL Apply 3 grams to 3 large joints, up to 3 times daily as needed.   Ginger, Zingiber officinalis, (GINGER PO) Take by mouth.   IBU 600 MG tablet TAKE 1 TABLET EVERY 8 HOURS AS NEEDED   levocetirizine (XYZAL) 5 MG tablet TAKE 1 TABLET EVERY EVENING   levothyroxine (SYNTHROID) 137 MCG tablet Take 1 tablet (137 mcg total) by mouth daily before breakfast.    losartan-hydrochlorothiazide (HYZAAR) 100-25 MG tablet Take 1 tablet by mouth daily.   magnesium oxide (MAG-OX) 400 MG tablet Take 400 mg by mouth daily.    mirtazapine (REMERON) 15 MG tablet TAKE 1 AND 1/2 TABLETS EVERY EVENING   Misc Natural Products (TART CHERRY ADVANCED PO) Take by mouth daily.   Multiple Vitamins-Minerals (PRESERVISION AREDS 2 PO) Take by mouth 2 (two) times daily.   omega-3 acid ethyl esters (LOVAZA) 1 G capsule Take 1 g by mouth 2 (two) times daily.   Potassium Chloride ER 20 MEQ TBCR Take 1 tablet (20 mEq total) by mouth daily.   potassium chloride SA (KLOR-CON M) 20 MEQ tablet Take 20 mEq by mouth daily.   tiZANidine (ZANAFLEX) 4 MG tablet Take 1 tablet (4 mg total) by mouth 2 (two) times daily.   torsemide (DEMADEX) 20 MG tablet TAKE 1 TABLET EVERY DAY (DISCONTINUE FUROSEMIDE)   TURMERIC PO Take by mouth daily.   vitamin E 400 UNIT capsule Take 800 Units by mouth daily.    [DISCONTINUED] oxyCODONE ER 18 MG C12A Take 18 mg by mouth 2 (two) times daily.   [DISCONTINUED] oxyCODONE ER 18 MG C12A Take 18 mg by mouth 2 (two) times daily.   [DISCONTINUED] Oxycodone HCl 10 MG TABS Take 1 tablet (10 mg total) by mouth 2 (two) times daily as needed.   [DISCONTINUED] Oxycodone HCl  10 MG TABS Take 1 tablet (10 mg total) by mouth 2 (two) times daily as needed.   [DISCONTINUED] predniSONE (DELTASONE) 5 MG tablet TAKE 2 TABLETS EVERY DAY WITH BREAKFAST   No facility-administered medications prior to visit.    ROS Negative unless otherwise noted in HPI   Objective:     BP (!) 163/83 (BP Location: Left Arm, Patient Position: Sitting, Cuff Size: Large)   Pulse 81   Resp 18   Ht 5\' 3"  (1.6 m)   Wt 166 lb (75.3 kg)   SpO2 98%   BMI 29.41 kg/m   Physical Exam Constitutional:      General: She is not in acute distress.    Appearance: Normal appearance.  HENT:     Head: Normocephalic and atraumatic.  Cardiovascular:     Rate and Rhythm: Normal rate and regular rhythm.      Heart sounds: No murmur heard.    No friction rub. No gallop.  Pulmonary:     Effort: Pulmonary effort is normal. No respiratory distress.     Breath sounds: No wheezing, rhonchi or rales.  Skin:    General: Skin is warm and dry.  Neurological:     Mental Status: She is alert and oriented to person, place, and time.     Assessment & Plan:  Inflammatory polyarthritis [facility]) Assessment & Plan: Patient would like to avoid going to another specialist, discussed importance of maintaining current medication routine to avoid overdose.  Pain is fairly well-controlled, continue oxycodone ER 18 mg twice daily, agreeable to slight decrease in oxycodone HCl 7.5 mg up to twice daily as needed.  She did receive a letter from her insurance company stating that they would no longer cover the extended release oxycodone, recommended formulary options of morphine ER or buprenorphine weekly.  Discussed that we will try to appeal this decision when the policy changes on June 16, 2023.  If needed, can convert current dosing of oxycodone to one of the formulary options. Recommended optimizing nonopioid therapy as well, using topical arthritis medication like Voltaren, can use up to 2 g daily of acetaminophen (500 mg every 6 hours). Recommend to avoid use of NSAIDs due to CKD.  PDMP reviewed, no aberrancies.  Overdose risk score 020. Two 30-day prescriptions sent to pharmacy.  Orders: -     oxyCODONE HCl; Take 7.5 mg by mouth 2 (two) times daily as needed.  Dispense: 45 tablet; Refill: 0 -     oxyCODONE HCl; Take 7.5 mg by mouth 2 (two) times daily as needed.  Dispense: 45 tablet; Refill: 0 -     oxyCODONE ER; Take 18 mg by mouth 2 (two) times daily.  Dispense: 60 capsule; Refill: 0 -     oxyCODONE ER; Take 18 mg by mouth 2 (two) times daily.  Dispense: 60 capsule; Refill: 0  Other osteoarthritis involving multiple joints -     oxyCODONE HCl; Take 7.5 mg by mouth 2 (two) times daily as needed.  Dispense: 45  tablet; Refill: 0 -     oxyCODONE HCl; Take 7.5 mg by mouth 2 (two) times daily as needed.  Dispense: 45 tablet; Refill: 0 -     oxyCODONE ER; Take 18 mg by mouth 2 (two) times daily.  Dispense: 60 capsule; Refill: 0 -     oxyCODONE ER; Take 18 mg by mouth 2 (two) times daily.  Dispense: 60 capsule; Refill: 0  Essential hypertension Assessment & Plan: BP goal <130/80.  Above goal today,  likely secondary to headache.  Continue losartan-HCTZ 100-25 mg daily, carvedilol 6.25 mg twice daily.  Will continue to monitor.   Hypothyroidism (acquired) Assessment & Plan: Most recent TSH uncharacteristically high but T4 within normal limits.  Continue levothyroxine 137 mcg daily and recheck thyroid labs at next appointment.   Stage 3a chronic kidney disease (HCC) Assessment & Plan: Creatinine 1.150, GFR 47.  Continue to monitor.  Recommend adequate hydration and avoiding nephrotoxic medication.     Return in about 2 months (around 07/20/2023) for follow-up for pain management, recheck thyroid labs.    Melida Quitter, PA

## 2023-05-19 NOTE — Assessment & Plan Note (Signed)
BP goal <130/80.  Above goal today, likely secondary to headache.  Continue losartan-HCTZ 100-25 mg daily, carvedilol 6.25 mg twice daily.  Will continue to monitor.

## 2023-05-19 NOTE — Assessment & Plan Note (Signed)
Patient would like to avoid going to another specialist, discussed importance of maintaining current medication routine to avoid overdose.  Pain is fairly well-controlled, continue oxycodone ER 18 mg twice daily, agreeable to slight decrease in oxycodone HCl 7.5 mg up to twice daily as needed.  She did receive a letter from her insurance company stating that they would no longer cover the extended release oxycodone, recommended formulary options of morphine ER or buprenorphine weekly.  Discussed that we will try to appeal this decision when the policy changes on June 16, 2023.  If needed, can convert current dosing of oxycodone to one of the formulary options. Recommended optimizing nonopioid therapy as well, using topical arthritis medication like Voltaren, can use up to 2 g daily of acetaminophen (500 mg every 6 hours). Recommend to avoid use of NSAIDs due to CKD.  PDMP reviewed, no aberrancies.  Overdose risk score 020. Two 30-day prescriptions sent to pharmacy.

## 2023-05-19 NOTE — Patient Instructions (Addendum)
I will send the prescription for your current medications today.  We will try to appeal your insurance about the long-acting oxycodone when their policy changes on January 1, and if that does not work we can switch to one of their recommended medications.  Use topical arthritis medication whenever you can to help with pain.  You can take some Tylenol, 500 mg every 6 hours (no more than 2000 mg every 24 hours total).  Thank you for being willing to work with me!  I am glad that we will be able to continue your care.

## 2023-05-19 NOTE — Assessment & Plan Note (Signed)
Most recent TSH uncharacteristically high but T4 within normal limits.  Continue levothyroxine 137 mcg daily and recheck thyroid labs at next appointment.

## 2023-05-20 MED ORDER — OXYCODONE HCL 5 MG PO TABS: 7.5000 mg | ORAL_TABLET | Freq: Two times a day (BID) | ORAL | 0 refills | Status: DC | PRN

## 2023-05-20 NOTE — Telephone Encounter (Signed)
Prescription has been resent for 5 mg tablets Meds ordered this encounter  Medications   mirtazapine (REMERON) 15 MG tablet    Sig: TAKE 1 AND 1/2 TABLETS EVERY EVENING    Dispense:  135 tablet    Refill:  3    Order Specific Question:   Supervising Provider    Answer:   Sandre Kitty [[id number]]   oxyCODONE (ROXICODONE) 5 MG immediate release tablet    Sig: Take 1.5 tablets (7.5 mg total) by mouth 2 (two) times daily as needed for severe pain (pain score 7-10).    Dispense:  45 tablet    Refill:  0    Order Specific Question:   Supervising Provider    Answer:   Sandre Kitty [[id number]]

## 2023-05-20 NOTE — Addendum Note (Signed)
Addended by: Saralyn Pilar on: 05/20/2023 11:18 AM   Modules accepted: Orders

## 2023-05-20 NOTE — Telephone Encounter (Signed)
Copied from CRM [id number]. Topic: Clinical - Prescription Issue >> May 20, 2023  8:58 AM Deaijah H wrote: Reason for CRM: [facility] called in to advise oxyCODONE HCl 7.5 MG TABS does not come in 7 mg to resend as 5's or 10's // callback # [phone]

## 2023-05-31 ENCOUNTER — Other Ambulatory Visit: Payer: Self-pay

## 2023-05-31 DIAGNOSIS — I4891 Unspecified atrial fibrillation: Secondary | ICD-10-CM

## 2023-05-31 MED ORDER — APIXABAN 5 MG PO TABS: 5.0000 mg | ORAL_TABLET | Freq: Two times a day (BID) | ORAL | 1 refills | Status: DC

## 2023-05-31 NOTE — Telephone Encounter (Signed)
Prescription refill request for Eliquis received. Indication: Afib  Last office visit: 10/30/22 ([place])  Scr: 1.15 (05/10/23)  Age: 82 Weight: 75.3kg  Appropriate dose. Refill sent.

## 2023-06-14 ENCOUNTER — Other Ambulatory Visit: Payer: Self-pay | Admitting: *Deleted

## 2023-06-14 MED ORDER — TORSEMIDE 20 MG PO TABS: 20.0000 mg | ORAL_TABLET | Freq: Every day | ORAL | 2 refills | Status: DC

## 2023-06-15 ENCOUNTER — Telehealth: Payer: Self-pay

## 2023-06-15 NOTE — Telephone Encounter (Signed)
 Copied from CRM [id number]. Topic: Clinical - Medication Question >> Jun 15, 2023 10:06 AM Powell HERO wrote: Reason for CRM: Patient wants to know if her prescription will be ready to pick up on time, she said she had some trouble last time based off of the type of medication it is (oxycodone ). Please call to advise patient when she can pick up.

## 2023-06-15 NOTE — Telephone Encounter (Signed)
Called patient and left a VM recommending that she calls the pharmacy and have them to notify her when her prescriptions are ready for pick up.

## 2023-07-14 DIAGNOSIS — H353211 Exudative age-related macular degeneration, right eye, with active choroidal neovascularization: Secondary | ICD-10-CM | POA: Diagnosis not present

## 2023-07-18 ENCOUNTER — Other Ambulatory Visit: Payer: Self-pay | Admitting: Nurse Practitioner

## 2023-07-18 DIAGNOSIS — I10 Essential (primary) hypertension: Secondary | ICD-10-CM

## 2023-07-20 ENCOUNTER — Encounter: Payer: Self-pay | Admitting: Family Medicine

## 2023-07-20 ENCOUNTER — Ambulatory Visit (INDEPENDENT_AMBULATORY_CARE_PROVIDER_SITE_OTHER): Payer: Medicare HMO | Admitting: Family Medicine

## 2023-07-20 VITALS — BP 132/72 | HR 87 | Ht 63.0 in | Wt 169.8 lb

## 2023-07-20 DIAGNOSIS — Z79891 Long term (current) use of opiate analgesic: Secondary | ICD-10-CM | POA: Diagnosis not present

## 2023-07-20 DIAGNOSIS — M150 Primary generalized (osteo)arthritis: Secondary | ICD-10-CM

## 2023-07-20 DIAGNOSIS — M064 Inflammatory polyarthropathy: Secondary | ICD-10-CM | POA: Diagnosis not present

## 2023-07-20 DIAGNOSIS — E039 Hypothyroidism, unspecified: Secondary | ICD-10-CM | POA: Diagnosis not present

## 2023-07-20 MED ORDER — MORPHINE SULFATE ER 30 MG PO TBCR: 30.0000 mg | EXTENDED_RELEASE_TABLET | Freq: Two times a day (BID) | ORAL | 0 refills | Status: DC

## 2023-07-20 MED ORDER — OXYCODONE HCL 5 MG PO TABS: 5.0000 mg | ORAL_TABLET | Freq: Two times a day (BID) | ORAL | 0 refills | Status: DC | PRN

## 2023-07-20 MED ORDER — NALOXONE HCL 4 MG/0.1ML NA LIQD: NASAL | 1 refills | Status: AC

## 2023-07-20 NOTE — Assessment & Plan Note (Signed)
Most recent TSH uncharacteristically high but T4 within normal limits.  Rechecking thyroid labs today.  Continue levothyroxine 137 mcg daily.

## 2023-07-20 NOTE — Progress Notes (Signed)
 Established Patient Office Visit  Subjective   Patient ID: Martha Peterson, female    DOB: 02-19-1941  Age: 83 y.o. MRN: [id number]  Chief Complaint  Patient presents with   Pain Management   Thyroid  follow up    HPI Martha Peterson is a 83 y.o. female presenting today for follow up of pain management.  She is currently taking oxycodone  ER 18 mg twice daily, and she reports doing well with the lower 5 mg dose of oxycodone  immediate release for severe breakthrough pain.  Unfortunately, her insurance will not pay for oxycodone  ER any longer even though she has been stable with well-managed pain for many years now.  Their preference is for morphine  ER limits or buprenorphine weekly transdermal patches.  Outpatient Medications Prior to Visit  Medication Sig Note   apixaban  (ELIQUIS ) 5 MG TABS tablet Take 1 tablet (5 mg total) by mouth 2 (two) times daily.    ascorbic acid  (VITAMIN C) 500 MG tablet Take 500 mg by mouth daily.    aspirin  EC 81 MG tablet Take 81 mg by mouth daily.    atorvastatin  (LIPITOR) 10 MG tablet Take 1 tablet (10 mg total) by mouth daily.    carvedilol  (COREG ) 6.25 MG tablet TAKE 1 TABLET TWICE DAILY WITH MEALS    cholecalciferol (VITAMIN D ) 400 UNITS TABS tablet Take 1,000 Units by mouth.    diclofenac  sodium (VOLTAREN ) 1 % GEL Apply 3 grams to 3 large joints, up to 3 times daily as needed.    Ginger, Zingiber officinalis, (GINGER PO) Take by mouth.    IBU 600 MG tablet TAKE 1 TABLET EVERY 8 HOURS AS NEEDED    levocetirizine (XYZAL ) 5 MG tablet TAKE 1 TABLET EVERY EVENING    levothyroxine  (SYNTHROID ) 137 MCG tablet Take 1 tablet (137 mcg total) by mouth daily before breakfast.    losartan -hydrochlorothiazide  (HYZAAR) 100-25 MG tablet TAKE 1 TABLET EVERY DAY    magnesium  oxide (MAG-OX) 400 MG tablet Take 400 mg by mouth daily.     mirtazapine  (REMERON ) 15 MG tablet TAKE 1 AND 1/2 TABLETS EVERY EVENING    Misc Natural Products (TART CHERRY ADVANCED PO) Take by  mouth daily.    Multiple Vitamins-Minerals (PRESERVISION AREDS 2 PO) Take by mouth 2 (two) times daily.    omega-3 acid ethyl esters (LOVAZA ) 1 G capsule Take 1 g by mouth 2 (two) times daily.    Potassium Chloride  ER 20 MEQ TBCR Take 1 tablet (20 mEq total) by mouth daily.    potassium chloride  SA (KLOR-CON  M) 20 MEQ tablet Take 20 mEq by mouth daily.    tiZANidine  (ZANAFLEX ) 4 MG tablet Take 1 tablet (4 mg total) by mouth 2 (two) times daily.    torsemide  (DEMADEX ) 20 MG tablet Take 1 tablet (20 mg total) by mouth daily.    TURMERIC PO Take by mouth daily.    vitamin E 400 UNIT capsule Take 800 Units by mouth daily.     [DISCONTINUED] naloxone  (NARCAN ) nasal spray 4 mg/0.1 mL Spray one device into nostril upon signs of opioid overdose. Call 911. Repeat with second device in the other nostril if no response within 2-3 minutes.    [DISCONTINUED] oxyCODONE  (ROXICODONE ) 5 MG immediate release tablet Take 1.5 tablets (7.5 mg total) by mouth 2 (two) times daily as needed for severe pain (pain score 7-10).    [DISCONTINUED] oxyCODONE  ER 18 MG C12A Take 18 mg by mouth 2 (two) times daily. 07/20/2023: insurance preference   [  DISCONTINUED] oxyCODONE  ER 18 MG C12A Take 18 mg by mouth 2 (two) times daily. 07/20/2023: insurance preference   No facility-administered medications prior to visit.    ROS Negative unless otherwise noted in HPI   Objective:     BP 132/72   Pulse 87   Ht 5' 3 (1.6 m)   Wt 169 lb 12 oz (77 kg)   SpO2 97%   BMI 30.07 kg/m   Physical Exam Constitutional:      General: She is not in acute distress.    Appearance: Normal appearance.  HENT:     Head: Normocephalic and atraumatic.  Cardiovascular:     Rate and Rhythm: Normal rate and regular rhythm.     Heart sounds: No murmur heard.    No friction rub. No gallop.  Pulmonary:     Effort: Pulmonary effort is normal. No respiratory distress.     Breath sounds: No wheezing, rhonchi or rales.  Skin:    General: Skin is  warm and dry.  Neurological:     Mental Status: She is alert and oriented to person, place, and time.     Assessment & Plan:  Hypothyroidism (acquired) Assessment & Plan: Most recent TSH uncharacteristically high but T4 within normal limits.  Rechecking thyroid  labs today.  Continue levothyroxine  137 mcg daily.  Orders: -     TSH; Future -     T4, free; Future  Inflammatory polyarthritis (HCC) Assessment & Plan: Due to insurance preference, changed from oxycodone  ER 18 mg twice daily to morphine  ER tablets.  Conversion of daily oxycodone  dose of 36 mg to daily morphine  is the equivalent of morphine  54 mg daily.  Tablets do not come in dosages equivalent to 27 mg morphine  daily, will prescribe morphine  30 mg twice daily since patient has decreased on as needed oxycodone  use.  Discussed the importance of maintaining medication exactly as prescribed to avoid overdose, prescribed Narcan  to keep on hand as well.  Continue optimizing nonopioid therapy using topical arthritis medication like Voltaren  and up to 2 g daily of acetaminophen  (500 mg every 6 hours). Recommend to avoid use of NSAIDs due to CKD.  PDMP reviewed, no aberrancies.  Overdose risk score 020. Two 30-day prescriptions sent to pharmacy.  If morphine  ER does not provide adequate pain relief, recommend to appeal decision and try to get insurance to cover oxycodone  ER 18 mg twice daily as she was previously taking.  Orders: -     Morphine  Sulfate ER; Take 1 tablet (30 mg total) by mouth every 12 (twelve) hours.  Dispense: 60 tablet; Refill: 0 -     oxyCODONE  HCl; Take 1 tablet (5 mg total) by mouth 2 (two) times daily as needed for severe pain (pain score 7-10).  Dispense: 45 tablet; Refill: 0 -     oxyCODONE  HCl; Take 1 tablet (5 mg total) by mouth 2 (two) times daily as needed for severe pain (pain score 7-10).  Dispense: 45 tablet; Refill: 0 -     Morphine  Sulfate ER; Take 1 tablet (30 mg total) by mouth every 12 (twelve) hours.   Dispense: 60 tablet; Refill: 0  Primary osteoarthritis involving multiple joints -     Morphine  Sulfate ER; Take 1 tablet (30 mg total) by mouth every 12 (twelve) hours.  Dispense: 60 tablet; Refill: 0 -     Morphine  Sulfate ER; Take 1 tablet (30 mg total) by mouth every 12 (twelve) hours.  Dispense: 60 tablet; Refill: 0  Chronic prescription  opiate use -     Naloxone  HCl; Spray one device into nostril upon signs of opioid overdose. Call 911. Repeat with second device in the other nostril if no response within 2-3 minutes.  Dispense: 1 each; Refill: 1    Return in about 2 months (around 09/17/2023) for follow-up for pain management, switched oxycodone  ER to morphine  ER (insurance preference).    Joesph DELENA Sear, PA

## 2023-07-20 NOTE — Patient Instructions (Addendum)
 We are switching the oxycodone  ER to morphine  ER twice daily as your insurance prefers.  If this does not provide adequate pain relief, we can appeal their decision to get you back on the oxycodone  ER.  Prioritize non-opioid management of pain using topical arthritis medication like Voltaren , and you can use up to 2 g daily of acetaminophen  (500 mg every 6 hours). If this is inadequate, you may use the oxycodone  5 mg up to twice daily.

## 2023-07-20 NOTE — Assessment & Plan Note (Addendum)
 Due to insurance preference, changed from oxycodone  ER 18 mg twice daily to morphine  ER tablets.  Conversion of daily oxycodone  dose of 36 mg to daily morphine  is the equivalent of morphine  54 mg daily.  Tablets do not come in dosages equivalent to 27 mg morphine  daily, will prescribe morphine  30 mg twice daily since patient has decreased on as needed oxycodone  use.  Discussed the importance of maintaining medication exactly as prescribed to avoid overdose, prescribed Narcan  to keep on hand as well.  Continue optimizing nonopioid therapy using topical arthritis medication like Voltaren  and up to 2 g daily of acetaminophen  (500 mg every 6 hours). Recommend to avoid use of NSAIDs due to CKD.  PDMP reviewed, no aberrancies.  Overdose risk score 020. Two 30-day prescriptions sent to pharmacy.  If morphine  ER does not provide adequate pain relief, recommend to appeal decision and try to get insurance to cover oxycodone  ER 18 mg twice daily as she was previously taking.

## 2023-07-21 ENCOUNTER — Encounter: Payer: Self-pay | Admitting: Family Medicine

## 2023-07-21 DIAGNOSIS — H353221 Exudative age-related macular degeneration, left eye, with active choroidal neovascularization: Secondary | ICD-10-CM | POA: Diagnosis not present

## 2023-07-21 LAB — TSH: TSH: 3.11 u[IU]/mL (ref 0.450–4.500)

## 2023-07-21 LAB — T4, FREE: Free T4: 1.75 ng/dL (ref 0.82–1.77)

## 2023-07-22 ENCOUNTER — Encounter: Payer: Self-pay | Admitting: Family Medicine

## 2023-07-27 ENCOUNTER — Encounter: Payer: Self-pay | Admitting: Family Medicine

## 2023-07-27 NOTE — Progress Notes (Addendum)
Patient's daughter contacted PCP.  See conversation below.  Lazarus Gowda Sorry to bother you this morning, my mother Martha Peterson, started on the MS contin 30mg  on Tuesday evening.  On Saturday she was feeling slightly dizzy and her gait was off.  Is there anyway that maybe she can get a lower dose or is there something else you would recommend.  This is new, she never had this issue in the past.  Mon 10:33 AM  Saralyn Pilar PA-C Oh no!  If she is still experiencing dizziness and gait abnormalities, I would recommend trying half tablet of the morphine to see if there is any improvement in those symptoms  Can the MS Contin ER be cut in half?  21 mins My apologies, she is on the extended release and those cannot be cut.  Has she had symptoms any other day other than Saturday? 19 mins Dorcas Carrow, RN She has not been comfortable taking anymore.  She lives by herself, and I thought that was probably best.  We can try it again and I will get my sister to stay with her.  16 mins  Sounds like a plan.  If she does develop symptoms again, let me know and I can send in a lower dose.

## 2023-08-20 ENCOUNTER — Other Ambulatory Visit: Payer: Self-pay

## 2023-08-20 DIAGNOSIS — E876 Hypokalemia: Secondary | ICD-10-CM

## 2023-08-20 MED ORDER — POTASSIUM CHLORIDE ER 20 MEQ PO TBCR
20.0000 meq | EXTENDED_RELEASE_TABLET | Freq: Every day | ORAL | 3 refills | Status: AC
Start: 2023-08-20 — End: ?

## 2023-08-25 ENCOUNTER — Other Ambulatory Visit (INDEPENDENT_AMBULATORY_CARE_PROVIDER_SITE_OTHER): Payer: Self-pay | Admitting: Vascular Surgery

## 2023-08-25 DIAGNOSIS — I6523 Occlusion and stenosis of bilateral carotid arteries: Secondary | ICD-10-CM

## 2023-08-27 ENCOUNTER — Ambulatory Visit (INDEPENDENT_AMBULATORY_CARE_PROVIDER_SITE_OTHER): Payer: Medicare HMO

## 2023-08-27 ENCOUNTER — Ambulatory Visit (INDEPENDENT_AMBULATORY_CARE_PROVIDER_SITE_OTHER): Payer: Medicare HMO | Admitting: Nurse Practitioner

## 2023-08-27 ENCOUNTER — Encounter (INDEPENDENT_AMBULATORY_CARE_PROVIDER_SITE_OTHER): Payer: Self-pay | Admitting: Nurse Practitioner

## 2023-08-27 VITALS — BP 137/86 | HR 98 | Resp 16

## 2023-08-27 DIAGNOSIS — I10 Essential (primary) hypertension: Secondary | ICD-10-CM | POA: Diagnosis not present

## 2023-08-27 DIAGNOSIS — E782 Mixed hyperlipidemia: Secondary | ICD-10-CM | POA: Diagnosis not present

## 2023-08-27 DIAGNOSIS — I6523 Occlusion and stenosis of bilateral carotid arteries: Secondary | ICD-10-CM

## 2023-08-27 DIAGNOSIS — I771 Stricture of artery: Secondary | ICD-10-CM | POA: Diagnosis not present

## 2023-08-29 ENCOUNTER — Encounter (INDEPENDENT_AMBULATORY_CARE_PROVIDER_SITE_OTHER): Payer: Self-pay | Admitting: Nurse Practitioner

## 2023-08-29 NOTE — Progress Notes (Signed)
 Subjective:    Patient ID: Martha Peterson, female    DOB: 30-Jan-1941, 83 y.o.   MRN: [id number] Chief Complaint  Patient presents with   Follow-up    1 yr carotid follow up    Patient returns in follow-up.  The patient is about 4 years status post left right carotid to carotid bypass.  She has done well and she currently has no focal neurological deficits.  She continues to have some right arm claudication symptoms and some associated numbness but she notes that the numbness is occasional and not severely lifestyle limiting to her currently.  She denies any neck pain or other issues.  The patient's duplex today notes a 1 to 39% stenosis of the right ICA with a 40 to 59% stenosis of the left ICA.  Studies remain fair;y consistent from last year.    Review of Systems  Neurological:  Positive for numbness.  All other systems reviewed and are negative.      Objective:   Physical Exam Vitals reviewed.  HENT:     Head: Normocephalic.  Cardiovascular:     Rate and Rhythm: Normal rate.     Pulses:          Radial pulses are 1+ on the right side and 2+ on the left side.  Pulmonary:     Effort: Pulmonary effort is normal.  Skin:    General: Skin is warm and dry.  Neurological:     Mental Status: She is alert and oriented to person, place, and time.  Psychiatric:        Mood and Affect: Mood normal.        Behavior: Behavior normal.        Thought Content: Thought content normal.        Judgment: Judgment normal.     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   Microhematuria    Mild mitral regurgitation    Mild tricuspid regurgitation    Neutropenia (HCC)  02/14/2018   Osteopenia    Pulmonary fibrosis (HCC)    Urinary incontinence     Social History   Socioeconomic History   Marital status: Widowed    Spouse name: Fayrene Fearing   Number of children: Not on file   Years of education: Not on file   Highest education level: Not on file  Occupational History   Not on file  Tobacco Use   Smoking status: Former    Types: Cigarettes    Passive exposure: Never   Smokeless tobacco: Never  Vaping Use   Vaping status: Never Used  Substance and Sexual Activity   Alcohol use: No   Drug use: Yes    Types: Oxycodone   Sexual activity: Not Currently  Other Topics Concern   Not on file  Social History Narrative   Lives at home with husband in private residence   Social Drivers of Health   Financial Resource Strain: Low Risk  (05/11/2023)   Overall Financial Resource Strain (CARDIA)    Difficulty of Paying Living Expenses: Not hard at all  Food Insecurity: No  Food Insecurity (05/11/2023)   Hunger Vital Sign    Worried About Running Out of Food in the Last Year: Never true    Ran Out of Food in the Last Year: Never true  Transportation Needs: No Transportation Needs (05/11/2023)   PRAPARE - Administrator, Civil Service (Medical): No    Lack of Transportation (Non-Medical): No  Physical Activity: Insufficiently Active (05/11/2023)   Exercise Vital Sign    Days of Exercise per Week: 3 days    Minutes of Exercise per Session: 30 min  Stress: No Stress Concern Present (05/11/2023)   [employer] of Occupational Health - Occupational Stress Questionnaire    Feeling of Stress : Not at all  Social Connections: Socially Isolated (05/11/2023)   Social Connection and Isolation Panel [NHANES]    Frequency of Communication with Friends and Family: More than three times a week    Frequency of Social Gatherings with Friends and Family: Three times a week    Attends Religious Services: Never    Active Member of Clubs or Organizations:  Not on file    Attends Banker Meetings: Never    Marital Status: Widowed  Intimate Partner Violence: Not At Risk (05/11/2023)   Humiliation, Afraid, Rape, and Kick questionnaire    Fear of Current or Ex-Partner: No    Emotionally Abused: No    Physically Abused: No    Sexually Abused: No    Past Surgical History:  Procedure Laterality Date   ABDOMINAL HYSTERECTOMY     ANKLE SURGERY Left    APPENDECTOMY     CAROTID PTA/STENT INTERVENTION N/A 07/17/2019   Procedure: CAROTID PTA/STENT INTERVENTION;  Surgeon: Annice Needy, MD;  Location: [facility] INVASIVE CV LAB;  Service: Cardiovascular;  Laterality: N/A;   CAROTID-SUBCLAVIAN BYPASS GRAFT N/A 07/26/2019   Procedure: BYPASS GRAFT CAROTID-SUBCLAVIAN ( CAROTID TO CAROTID BYPASS);  Surgeon: Annice Needy, MD;  Location: [facility] ORS;  Service: Vascular;  Laterality: N/A;   CHOLECYSTECTOMY     COLONOSCOPY WITH PROPOFOL N/A 12/04/2014   Procedure: COLONOSCOPY WITH PROPOFOL;  Surgeon: Christena Deem, MD;  Location[facility] END[facility]COPY;  Service: Endoscopy;  Laterality: N/A;   FRACTURE SURGERY     Femur Fx; LT Ankle Pinning   JOINT REPLACEMENT     RT TKR   Patial Thyroid Resection      Family History  Problem Relation Age of Onset   Heart Problems Mother    Heart Problems Father    Diabetes Father    Lupus Sister    Thyroid disease Sister    Heart Problems Sister    Hypertension Son    Diabetes Son        borderline    Arthritis Daughter        in the left knee    Diabetes Daughter    Neuropathy Daughter    Depression Daughter    Anxiety disorder Daughter     Allergies  Allergen Reactions   Latex Hives, Itching and Rash   Codeine    Haemophilus Influenzae Vaccines Dermatitis   Levaquin [Levofloxacin]    Tape     latex       Latest Ref Rng & Units 09/10/2022   11:41 AM 03/31/2021   11:09 AM 10/16/2020   11:06 AM  CBC  WBC 3.4 - 10.8 x10E3/uL 3.6  3.8  4.2   Hemoglobin 11.1 - 15.9 g/dL 62.1  30.8  65.7   Hematocrit  34.0 - 46.6 % 35.3  35.4  37.5   Platelets 150 - 450 x10E3/uL 114  126  121       CMP     Component Value Date/Time   NA 138 05/10/2023 1139   NA 135 (L) 02/02/2013 1038   K 4.5 05/10/2023 1139   K 3.8 02/02/2013 1038   CL 98 05/10/2023 1139   CL 102 02/02/2013 1038   CO2 27 05/10/2023 1139   CO2 29 02/02/2013 1038   GLUCOSE 108 (H) 05/10/2023 1139   GLUCOSE 106 (H) 10/16/2019 1102   GLUCOSE 111 (H) 02/02/2013 1038   BUN 23 05/10/2023 1139   BUN 15 02/02/2013 1038   CREATININE 1.15 (H) 05/10/2023 1139   CREATININE 0.99 02/20/2014 1019   CALCIUM 9.5 05/10/2023 1139   CALCIUM 8.9 02/20/2014 1019   PROT 7.5 05/10/2023 1139   PROT 8.3 (H) 08/05/2011 1020   ALBUMIN 4.3 05/10/2023 1139   ALBUMIN 3.6 08/05/2011 1020   AST 38 05/10/2023 1139   AST 75 (H) 08/05/2011 1020   ALT 17 05/10/2023 1139   ALT 90 (H) 08/05/2011 1020   ALKPHOS 151 (H) 05/10/2023 1139   ALKPHOS 141 (H) 08/05/2011 1020   BILITOT 0.8 05/10/2023 1139   BILITOT 0.7 08/05/2011 1020   GFRNONAA 44 (L) 07/10/2020 1148   GFRNONAA 56 (L) 02/20/2014 1019   GFRAA 50 (L) 07/10/2020 1148   GFRAA >60 02/20/2014 1019     No results found.     Assessment & Plan:   1. 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HCC) Widely patent bypass on duplex.  Continue current medical regimen.  Recheck in 12 months.  Does have some right arm claudication type symptoms and we discussed that we could try to get across this percutaneously into a stent now that her mid common carotid artery is ligated we would not have to worry about embolization distally.  She says that her symptoms are not severe enough to warrant that at this point but we will keep an eye on this. She is advised to follow up if issues worsen  2. Essential hypertension Continue antihypertensive medications as already ordered, these medications have been reviewed and there are no changes at this time.  3. Hyperlipidemia, mixed Continue statin as ordered and reviewed, no changes  at this time   Current Outpatient Medications on File Prior to Visit  Medication Sig Dispense Refill   apixaban (ELIQUIS) 5 MG TABS tablet Take 1 tablet (5 mg total) by mouth 2 (two) times daily. 180 tablet 1   ascorbic acid (VITAMIN C) 500 MG tablet Take 500 mg by mouth daily.     aspirin EC 81 MG tablet Take 81 mg by mouth daily.     atorvastatin (LIPITOR) 10 MG tablet Take 1 tablet (10 mg total) by mouth daily. 90 tablet 10   carvedilol (COREG) 6.25 MG tablet TAKE 1 TABLET TWICE DAILY WITH MEALS 180 tablet 3   cholecalciferol (VITAMIN D) 400 UNITS TABS tablet Take 1,000 Units by mouth.     diclofenac sodium (VOLTAREN) 1 % GEL Apply 3 grams to 3 large joints, up to 3 times daily as needed. 3 Tube 3   Ginger, Zingiber officinalis, (GINGER PO) Take by mouth.     IBU 600 MG tablet TAKE 1 TABLET EVERY 8 HOURS AS NEEDED 110 tablet 10   levocetirizine (XYZAL) 5 MG tablet TAKE 1 TABLET EVERY EVENING 90 tablet 3   levothyroxine (SYNTHROID) 137 MCG tablet Take 1 tablet (137 mcg total) by mouth daily before breakfast. 90  tablet 3   losartan-hydrochlorothiazide (HYZAAR) 100-25 MG tablet TAKE 1 TABLET EVERY DAY 90 tablet 3   magnesium oxide (MAG-OX) 400 MG tablet Take 400 mg by mouth daily.      mirtazapine (REMERON) 15 MG tablet TAKE 1 AND 1/2 TABLETS EVERY EVENING 135 tablet 3   Misc Natural Products (TART CHERRY ADVANCED PO) Take by mouth daily.     morphine (MS CONTIN) 30 MG 12 hr tablet Take 1 tablet (30 mg total) by mouth every 12 (twelve) hours. 60 tablet 0   morphine (MS CONTIN) 30 MG 12 hr tablet Take 1 tablet (30 mg total) by mouth every 12 (twelve) hours. 60 tablet 0   Multiple Vitamins-Minerals (PRESERVISION AREDS 2 PO) Take by mouth 2 (two) times daily.     naloxone (NARCAN) nasal spray 4 mg/0.1 mL Spray one device into nostril upon signs of opioid overdose. Call 911. Repeat with second device in the other nostril if no response within 2-3 minutes. 1 each 1   omega-3 acid ethyl esters  (LOVAZA) 1 G capsule Take 1 g by mouth 2 (two) times daily.     oxyCODONE (ROXICODONE) 5 MG immediate release tablet Take 1 tablet (5 mg total) by mouth 2 (two) times daily as needed for severe pain (pain score 7-10). 45 tablet 0   oxyCODONE (ROXICODONE) 5 MG immediate release tablet Take 1 tablet (5 mg total) by mouth 2 (two) times daily as needed for severe pain (pain score 7-10). 45 tablet 0   Potassium Chloride ER 20 MEQ TBCR Take 1 tablet (20 mEq total) by mouth daily. 90 tablet 3   potassium chloride SA (KLOR-CON M) 20 MEQ tablet Take 20 mEq by mouth daily.     tiZANidine (ZANAFLEX) 4 MG tablet Take 1 tablet (4 mg total) by mouth 2 (two) times daily. 180 tablet 3   torsemide (DEMADEX) 20 MG tablet Take 1 tablet (20 mg total) by mouth daily. 90 tablet 2   TURMERIC PO Take by mouth daily.     vitamin E 400 UNIT capsule Take 800 Units by mouth daily.      No current facility-administered medications on file prior to visit.    There are no Patient Instructions on file for this visit. No follow-ups on file.   Georgiana Spinner, NP

## 2023-09-01 ENCOUNTER — Other Ambulatory Visit: Payer: Self-pay

## 2023-09-01 ENCOUNTER — Other Ambulatory Visit: Payer: Self-pay | Admitting: Family Medicine

## 2023-09-01 ENCOUNTER — Other Ambulatory Visit (HOSPITAL_COMMUNITY): Payer: Self-pay

## 2023-09-01 DIAGNOSIS — M150 Primary generalized (osteo)arthritis: Secondary | ICD-10-CM

## 2023-09-01 DIAGNOSIS — M064 Inflammatory polyarthropathy: Secondary | ICD-10-CM

## 2023-09-01 MED ORDER — MORPHINE SULFATE ER 30 MG PO TBCR: 30.0000 mg | EXTENDED_RELEASE_TABLET | Freq: Two times a day (BID) | ORAL | 0 refills | Status: DC

## 2023-09-01 NOTE — Telephone Encounter (Signed)
 Copied from CRM [id number]. Topic: Clinical - Medication Refill >> Sep 01, 2023  8:53 AM Franchot Heidelberg wrote: Most Recent Primary Care Visit:  Provider: Saralyn Pilar A  Department: [facility]  Visit Type: OFFICE VISIT  Date: 07/20/2023  Medication: morphine (MS CONTIN) 30 MG 12 hr tablet  Pt only received 20 tablets from pharmacy because they did not have supply in stock. Pt needs the other 40 requested from new pharmacy below. Please advise   Has the patient contacted their pharmacy? Yes (Agent: If no, request that the patient contact the pharmacy for the refill. If patient does not wish to contact the pharmacy document the reason why and proceed with request.) (Agent: If yes, when and what did the pharmacy advise?)  Is this the correct pharmacy for this prescription? Yes If no, delete pharmacy and type the correct one.  This is the patient's preferred pharmacy:   [facility] [address] [place] [address] Phone: [phone] Fax: [phone]  Has the prescription been filled recently? Yes  Is the patient out of the medication? Yes. Only has 1 pill left for today, nothing for tonight   Has the patient been seen for an appointment in the last year OR does the patient have an upcoming appointment? Yes  Can we respond through MyChart? Yes  Agent: Please be advised that Rx refills may take up to 3 business days. We ask that you follow-up with your pharmacy.

## 2023-09-02 ENCOUNTER — Ambulatory Visit: Payer: Self-pay | Admitting: Family Medicine

## 2023-09-02 ENCOUNTER — Telehealth: Payer: Self-pay | Admitting: *Deleted

## 2023-09-02 ENCOUNTER — Other Ambulatory Visit: Payer: Self-pay

## 2023-09-02 DIAGNOSIS — M150 Primary generalized (osteo)arthritis: Secondary | ICD-10-CM

## 2023-09-02 DIAGNOSIS — M064 Inflammatory polyarthropathy: Secondary | ICD-10-CM

## 2023-09-02 NOTE — Telephone Encounter (Unsigned)
 Copied from CRM [id number]. Topic: Clinical - Prescription Issue >> Sep 02, 2023  3:15 PM Priscille Loveless wrote: Reason for CRM: Pt was prescribed morphine but it was sent to [facility] and pt needs it to go to [facility] asap. She is out of it and needs it.

## 2023-09-02 NOTE — Telephone Encounter (Unsigned)
 Copied from CRM [id number]. Topic: Clinical - Prescription Issue >> Sep 02, 2023 12:15 PM Haroldine Laws wrote: Reason for CRM: pt's daughter Cala Bradford called saying her mothers MS contin was sent to [facility] but she is out of her medication.  They said they called around and [facility] pharmacy has the medication.  The daughter is asking is provider can send to [facility]  CB#  [phone]

## 2023-09-02 NOTE — Telephone Encounter (Signed)
 PCP put pt on morphine but her pharm[facility] does not have it.    Pt would like the medication sent to [facility] pharm as pt is out of the medication at home. [facility] does have the medication.   Please call pt and advise when medication has been sent to [facility]. Reason for Disposition . [1] Caller requesting NON-URGENT health information AND [2] PCP's office is the best resource  Answer Assessment - Initial Assessment Questions 1. REASON FOR CALL or QUESTION: "What is your reason for calling today?" or "How can I best help you?" or "What question do you have that I can help answer?"     Pt would like the medication sent to [facility] as pt is out of the medication at home. [facility] does have the medication.  Protocols used: Information Only Call - No Triage-A-AH

## 2023-09-03 ENCOUNTER — Other Ambulatory Visit: Payer: Self-pay

## 2023-09-03 ENCOUNTER — Telehealth: Payer: Self-pay

## 2023-09-03 MED ORDER — MORPHINE SULFATE ER 30 MG PO TBCR
30.0000 mg | EXTENDED_RELEASE_TABLET | Freq: Two times a day (BID) | ORAL | 0 refills | Status: DC
  Filled 2023-09-03: qty 60, 30d supply, fill #0

## 2023-09-03 NOTE — Telephone Encounter (Signed)
 Please call the [facility] pharmacy and see if they can cancel the sending of the order of morphine that was sent yesterday.

## 2023-09-03 NOTE — Addendum Note (Signed)
 Addended by: Sandre Kitty on: 09/03/2023 06:29 AM   Modules accepted: Orders

## 2023-09-03 NOTE — Telephone Encounter (Signed)
 Called [facility] spoke to [facility] Rx has been discontinued

## 2023-09-03 NOTE — Telephone Encounter (Signed)
 Informed pt daughter that this was sent to pharmacy

## 2023-09-03 NOTE — Telephone Encounter (Signed)
 Copied from CRM [id number]. Topic: Clinical - Medication Refill >> Sep 02, 2023  4:49 PM Alessandra Bevels wrote: Most Recent Primary Care Visit:  Provider: Saralyn Pilar A  Department: [facility]  Visit Type: OFFICE VISIT  Date: 07/20/2023  Medication:  morphine (MS CONTIN) 30 MG 12 hr tablet [[id number]] Patient is requesting if medication can be sent to local pharmacy below.     Has the patient contacted their pharmacy? Yes (Agent: If no, request that the patient contact the pharmacy for the refill. If patient does not wish to contact the pharmacy document the reason why and proceed with request.) (Agent: If yes, when and what did the pharmacy advise?)  Is this the correct pharmacy for this prescription? Yes If no, delete pharmacy and type the correct one.  This is the patient's preferred pharmacy:    [facility] [address] [place] [address] Phone: [phone] Fax: [phone]   Has the prescription been filled recently? Yes  Is the patient out of the medication? Yes  Has the patient been seen for an appointment in the last year OR does the patient have an upcoming appointment? Yes  Can we respond through MyChart? Yes  Agent: Please be advised that Rx refills may take up to 3 business days. We ask that you follow-up with your pharmacy.

## 2023-09-16 ENCOUNTER — Encounter: Payer: Self-pay | Admitting: Family Medicine

## 2023-09-16 ENCOUNTER — Ambulatory Visit (INDEPENDENT_AMBULATORY_CARE_PROVIDER_SITE_OTHER): Payer: Medicare HMO | Admitting: Family Medicine

## 2023-09-16 ENCOUNTER — Other Ambulatory Visit: Payer: Self-pay

## 2023-09-16 DIAGNOSIS — M150 Primary generalized (osteo)arthritis: Secondary | ICD-10-CM | POA: Diagnosis not present

## 2023-09-16 DIAGNOSIS — M064 Inflammatory polyarthropathy: Secondary | ICD-10-CM | POA: Diagnosis not present

## 2023-09-16 MED ORDER — OXYCODONE HCL 5 MG PO TABS
5.0000 mg | ORAL_TABLET | Freq: Two times a day (BID) | ORAL | 0 refills | Status: DC | PRN
  Filled 2023-11-19: qty 45, 23d supply, fill #0

## 2023-09-16 MED ORDER — OXYCODONE HCL 5 MG PO TABS
5.0000 mg | ORAL_TABLET | Freq: Two times a day (BID) | ORAL | 0 refills | Status: DC | PRN
  Filled 2023-09-20: qty 45, 23d supply, fill #0

## 2023-09-16 MED ORDER — MORPHINE SULFATE ER 30 MG PO TBCR
30.0000 mg | EXTENDED_RELEASE_TABLET | Freq: Two times a day (BID) | ORAL | 0 refills | Status: DC
  Filled 2023-11-03: qty 60, 30d supply, fill #0

## 2023-09-16 MED ORDER — MORPHINE SULFATE ER 30 MG PO TBCR
30.0000 mg | EXTENDED_RELEASE_TABLET | Freq: Two times a day (BID) | ORAL | 0 refills | Status: DC
Start: 2023-12-03 — End: 2024-01-03
  Filled 2023-12-03: qty 60, 30d supply, fill #0

## 2023-09-16 MED ORDER — OXYCODONE HCL 5 MG PO TABS
5.0000 mg | ORAL_TABLET | Freq: Two times a day (BID) | ORAL | 0 refills | Status: DC | PRN
Start: 2023-10-18 — End: 2023-12-16
  Filled 2023-10-20: qty 45, 23d supply, fill #0

## 2023-09-16 MED ORDER — MORPHINE SULFATE ER 30 MG PO TBCR
30.0000 mg | EXTENDED_RELEASE_TABLET | Freq: Two times a day (BID) | ORAL | 0 refills | Status: DC
  Filled 2023-10-04: qty 51, 25d supply, fill #0
  Filled 2023-10-04: qty 9, 5d supply, fill #0

## 2023-09-16 NOTE — Progress Notes (Signed)
   Established Patient Office Visit  Subjective   Patient ID: Martha Peterson, female    DOB: Aug 06, 1940  Age: 83 y.o. MRN: [id number]  Chief Complaint  Patient presents with   Medical Management of Chronic Issues    HPI  Pain mgmt - patient has no issues with the morphine extended release 30 mg.  Feels like it is working well for her.  The first few days made her drowsy but otherwise no issues.  Would like it sent to [facility].  Her other medication for pain of the same.  She occasionally takes a half a tablet of an NSAID.  We discussed this and Tylenol.  Patient has a history of Elita Boone.  States she was told not to take Tylenol in the past.   The ASCVD Risk score (Arnett DK, et al., 2019) failed to calculate for the following reasons:   The 2019 ASCVD risk score is only valid for ages 14 to 40  Health Maintenance Due  Topic Date Due   DTaP/Tdap/Td (1 - Tdap) Never done   COVID-19 Vaccine (4 - 2024-25 season) 02/14/2023      Objective:     BP 117/69   Pulse 99   Ht 5\' 3"  (1.6 m)   Wt 177 lb 6.4 oz (80.5 kg)   SpO2 98%   BMI 31.42 kg/m    Physical Exam General: Alert, oriented.  Accompanied by daughter Pulmonary: No respiratory stress Psych: Pleasant affect   No results found for any visits on 09/16/23.      Assessment & Plan:   Inflammatory polyarthritis (HCC) Assessment & Plan: Continue with MS Contin 30 mg twice daily and oxycodone as needed.  Sending to [facility].  Follow-up in 3 months.  Orders: -     Morphine Sulfate ER; Take 1 tablet (30 mg total) by mouth every 12 (twelve) hours.  Dispense: 60 tablet; Refill: 0 -     Morphine Sulfate ER; Take 1 tablet (30 mg total) by mouth every 12 (twelve) hours.  Dispense: 60 tablet; Refill: 0 -     Morphine Sulfate ER; Take 1 tablet (30 mg total) by mouth every 12 (twelve) hours.  Dispense: 60 tablet; Refill: 0 -     oxyCODONE HCl; Take 1 tablet (5 mg total) by mouth 2 (two) times daily as needed for severe  pain (pain score 7-10).  Dispense: 45 tablet; Refill: 0 -     oxyCODONE HCl; Take 1 tablet (5 mg total) by mouth 2 (two) times daily as needed for severe pain (pain score 7-10).  Dispense: 45 tablet; Refill: 0 -     oxyCODONE HCl; Take 1 tablet (5 mg total) by mouth 2 (two) times daily as needed for severe pain (pain score 7-10).  Dispense: 45 tablet; Refill: 0  Primary osteoarthritis involving multiple joints -     Morphine Sulfate ER; Take 1 tablet (30 mg total) by mouth every 12 (twelve) hours.  Dispense: 60 tablet; Refill: 0 -     Morphine Sulfate ER; Take 1 tablet (30 mg total) by mouth every 12 (twelve) hours.  Dispense: 60 tablet; Refill: 0 -     Morphine Sulfate ER; Take 1 tablet (30 mg total) by mouth every 12 (twelve) hours.  Dispense: 60 tablet; Refill: 0     Return in about 3 months (around 12/16/2023) for pain mgmt.    Sandre Kitty, MD

## 2023-09-16 NOTE — Assessment & Plan Note (Signed)
 Continue with MS Contin 30 mg twice daily and oxycodone as needed.  Sending to [facility].  Follow-up in 3 months.

## 2023-09-16 NOTE — Patient Instructions (Signed)
 It was nice to see you today,  We addressed the following topics today: I will send your pain medications into [facility] as monthly prescriptions.   I will see you back in 3 months.  Have a great day,  Frederic Jericho, MD

## 2023-09-20 ENCOUNTER — Other Ambulatory Visit: Payer: Self-pay

## 2023-10-01 ENCOUNTER — Other Ambulatory Visit: Payer: Self-pay

## 2023-10-04 ENCOUNTER — Other Ambulatory Visit: Payer: Self-pay

## 2023-10-05 ENCOUNTER — Other Ambulatory Visit: Payer: Self-pay

## 2023-10-20 ENCOUNTER — Other Ambulatory Visit: Payer: Self-pay

## 2023-11-02 ENCOUNTER — Encounter (INDEPENDENT_AMBULATORY_CARE_PROVIDER_SITE_OTHER): Payer: Self-pay

## 2023-11-02 ENCOUNTER — Other Ambulatory Visit: Payer: Self-pay

## 2023-11-03 ENCOUNTER — Other Ambulatory Visit: Payer: Self-pay

## 2023-11-19 ENCOUNTER — Other Ambulatory Visit: Payer: Self-pay

## 2023-12-02 ENCOUNTER — Other Ambulatory Visit: Payer: Self-pay

## 2023-12-03 ENCOUNTER — Other Ambulatory Visit: Payer: Self-pay

## 2023-12-07 ENCOUNTER — Other Ambulatory Visit: Payer: Self-pay

## 2023-12-07 ENCOUNTER — Telehealth: Payer: Self-pay | Admitting: Cardiology

## 2023-12-07 DIAGNOSIS — I4891 Unspecified atrial fibrillation: Secondary | ICD-10-CM

## 2023-12-07 MED ORDER — APIXABAN 5 MG PO TABS
5.0000 mg | ORAL_TABLET | Freq: Two times a day (BID) | ORAL | 1 refills | Status: DC
Start: 2023-12-07 — End: 2023-12-09

## 2023-12-07 NOTE — Telephone Encounter (Signed)
*  STAT* If patient is at the pharmacy, call can be transferred to refill team.   1. Which medications need to be refilled? (please list name of each medication and dose if known) Eliquis    2. Would you like to learn more about the convenience, safety, & potential cost savings by using the [facility]? yes     3. Are you open to using the [facility] (Type [facility]. yes ).   4. Which pharmacy/location (including street and city if local pharmacy) is medication to be sent to?[facility] [address]  [place]   5. Do they need a 30 day or 90 day supply? 90

## 2023-12-07 NOTE — Telephone Encounter (Signed)
 Eliquis  5mg  refill request received. Patient is 83 years old, weight-80.5kg, Crea-1.15 on 05/10/23, Diagnosis-Afib, and last seen by Dr. Budd Kindle on 10/30/22 and due for an appt. Dose is appropriate based on dosing criteria.   Message sent to Schedulers.

## 2023-12-09 ENCOUNTER — Other Ambulatory Visit: Payer: Self-pay

## 2023-12-09 ENCOUNTER — Encounter: Payer: Self-pay | Admitting: Student

## 2023-12-09 ENCOUNTER — Ambulatory Visit: Attending: Student | Admitting: Student

## 2023-12-09 VITALS — BP 134/82 | HR 95 | Ht 63.0 in | Wt 183.4 lb

## 2023-12-09 DIAGNOSIS — E7800 Pure hypercholesterolemia, unspecified: Secondary | ICD-10-CM

## 2023-12-09 DIAGNOSIS — I6523 Occlusion and stenosis of bilateral carotid arteries: Secondary | ICD-10-CM

## 2023-12-09 DIAGNOSIS — I771 Stricture of artery: Secondary | ICD-10-CM

## 2023-12-09 DIAGNOSIS — I10 Essential (primary) hypertension: Secondary | ICD-10-CM | POA: Diagnosis not present

## 2023-12-09 DIAGNOSIS — Z79899 Other long term (current) drug therapy: Secondary | ICD-10-CM

## 2023-12-09 DIAGNOSIS — I4821 Permanent atrial fibrillation: Secondary | ICD-10-CM

## 2023-12-09 DIAGNOSIS — I5032 Chronic diastolic (congestive) heart failure: Secondary | ICD-10-CM | POA: Diagnosis not present

## 2023-12-09 MED ORDER — APIXABAN 5 MG PO TABS
5.0000 mg | ORAL_TABLET | Freq: Two times a day (BID) | ORAL | 3 refills | Status: DC
Start: 2023-12-09 — End: 2024-05-09
  Filled 2023-12-09 – 2023-12-10 (×3): qty 180, 90d supply, fill #0

## 2023-12-09 NOTE — Patient Instructions (Signed)
 Medication Instructions:  Your physician recommends that you continue on your current medications as directed. Please refer to the Current Medication list given to you today.    Refill for Eliquis  have been sent to [facility].  *If you need a refill on your cardiac medications before your next appointment, please call your pharmacy*  Lab Work: Your provider would like for you to have following labs drawn today BMet, CBC.   If you have labs (blood work) drawn today and your tests are completely normal, you will receive your results only by: MyChart Message (if you have MyChart) OR A paper copy in the mail If you have any lab test that is abnormal or we need to change your treatment, we will call you to review the results.  Testing/Procedures: None ordered at this time   Follow-Up: At [facility], you and your health needs are our priority.  As part of our continuing mission to provide you with exceptional heart care, our providers are all part of one team.  This team includes your primary Cardiologist (physician) and Advanced Practice Providers or APPs (Physician Assistants and Nurse Practitioners) who all work together to provide you with the care you need, when you need it.  Your next appointment:   6 month(s)  Provider:   You may see Redell Cave, MD or one of the following Advanced Practice Providers on your designated Care Team:   Lonni Meager, NP Lesley Maffucci, PA-C Bernardino Bring, PA-C Cadence El Rito, PA-C Tylene Lunch, NP Barnie Hila, NP    We recommend signing up for the patient portal called MyChart.  Sign up information is provided on this After Visit Summary.  MyChart is used to connect with patients for Virtual Visits (Telemedicine).  Patients are able to view lab/test results, encounter notes, upcoming appointments, etc.  Non-urgent messages can be sent to your provider as well.   To learn more about what you can do with MyChart, go to  [email or web address].   Other Instructions  Please use the provided daily weight log and bring with you at your next appointment.

## 2023-12-09 NOTE — Progress Notes (Signed)
 Cardiology Clinic Note   Date: 12/09/2023 ID: Martha, Peterson 08/02/1940, MRN [id number]  Primary Cardiologist:  Redell Cave, MD  Chief Complaint   Martha Peterson is a 83 y.o. female who presents to the clinic today for routine follow up.   Patient Profile   Martha Peterson is followed by Dr. Cave for the history outlined below.      Past medical history significant for: Chronic HFrEF with improved LV function/Pulmonary hypertension. Nuclear stress test 07/10/2019: Normal, low risk study.  Downsloping ST segment depressions noted during stress in II, III and aVF leads not diagnostic given abnormal baseline.  No T wave inversion during stress. Echo 10/05/2019: EF 60 to 65%.  No RWMA.  Grade II DD.  Normal RV size/function.  Moderately elevated PA pressure, RVSP 42.7 mmHg.  Severe LAE.  Mild RAE.  Mild MR.  Mild to moderate aortic valve sclerosis/calcification without stenosis.  (Compared to echo done at outside facility in [place], [place] EF 45%, mild MR) December 2020. Permanent A-fib. Onset November 2021. 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 Carotid artery stenosis. Carotid duplex 08/27/2023: Bilateral ICA 1 to 39%. Hypertension. Hyperlipidemia. Lipid panel 05/10/2023: LDL 37, HDL 53, TG 78, total 106. CKD stage III. TIA.  In summary, patient was first evaluated by Dr. Cave on 06/30/2019 for abnormal echo.  Patient reported history of MR followed with periodic echoes.  Echo obtained in December 2020 showed mildly reduced EF of 45%, Grade I DD, moderate concentric LVH, trivial MR, aortic sclerosis without stenosis.  She reported history of carotid stenosis with with CTa of the neck showing high-grade calcific stenosis innominate artery, flow-limiting stenosis in the right carotid system, sclerosis with calcification at the carotid bifurcation bilaterally  without significant stenosis.  She was pending vascular surgery visit.  She ultimately underwent carotid to carotid bypass in February 2021.  Patient was seen in the office in November 2021 for routine follow-up.  EKG showed new onset A-fib heart rate 110 bpm.  She was started on Eliquis  for stroke prophylaxis.  Given her severely dilated left atrium it was felt her chance of maintaining sinus rhythm with cardioversion was significantly reduced.  Patient was last seen in the office by Dr. Cave on 10/30/2022 for routine follow-up.  She was doing well at that time with no cardiac complaints.  No medication changes were made.     History of Present Illness    Today, patient is accompanied by her daughter. She is doing well. She denies chest pain, pressure or tightness. She has chronic lower extremity edema L>R that she manges with torsemide . She takes 30 mg most days with an occasional extra 10 mg. She does not weigh daily. Lower extremities are at their best in the morning and edema progresses throughout the day. She bases extra torsemide  on increased edema and sometimes just knowing the edema is going to happen. She reports dyspnea is at baseline. She notes increased dyspnea when she walks longer distances. No orthopnea or PND. She denies palpitations. She tries to limit her caffeine. She denies blood in stool or urine.     ROS: All other systems reviewed and are otherwise negative except as noted in History of Present Illness.  EKGs/Labs Reviewed    EKG Interpretation Date/Time:  Thursday December 09 2023 14:14:29 EDT Ventricular Rate:  83 PR Interval:    QRS Duration:  86 QT Interval:  384  QTC Calculation: 451 R Axis:   26  Text Interpretation: Atrial fibrillation Possible Inferior infarct , age undetermined Anterior infarct , age undetermined When compared with ECG of 5/17/204 (not in [facility]) No significant change Confirmed by Loistine Sober [phone]) on 12/09/2023 2:23:37 PM    05/10/2023: ALT 17; AST 38; BUN 23; Creatinine, Ser 1.15; Potassium 4.5; Sodium 138   07/20/2023: TSH 3.110    Risk Assessment/Calculations     CHA2DS2-VASc Score = 7   This indicates a 11.2% annual risk of stroke. The patient's score is based upon: CHF History: 1 HTN History: 1 Diabetes History: 0 Stroke History: 2 Vascular Disease History: 0 Age Score: 2 Gender Score: 1             Physical Exam    VS:  BP 134/82   Pulse 95   Ht 5' 3 (1.6 m)   Wt 183 lb 6.4 oz (83.2 kg)   BMI 32.49 kg/m  , BMI Body mass index is 32.49 kg/m.  GEN: Well nourished, well developed, in no acute distress. Neck: No JVD or carotid bruits. Cardiac:  Irregularly irregular rhythm. 2/6 systolic murmur. No rubs or gallops.   Respiratory:  Respirations regular and unlabored. Clear to auscultation without rales, wheezing or rhonchi. GI: Soft, nontender, nondistended. Extremities: Radials/DP/PT 2+ and equal bilaterally. No clubbing or cyanosis. 1+ pitting L>R lower extremity edema.   Skin: Warm and dry, no rash. Neuro: Strength intact.  Assessment & Plan   Chronic HFmrEF with improved LV function/pulmonary hypertension Echo April 2021 showed EF 60 to 65%, no RWMA, grade 2 DD, normal RV size/function, moderately elevated PA pressure, severe LAE, mild RAE, mild MR, mild to moderate aortic valve sclerosis without stenosis.  Patient reports chronic lower extremity edema L>R (left leg always bigger secondary to multiple lower extremity injuries). She manages edema with torsemide  typically taking 30 mg daily with an occasional extra 10 mg. She does not weigh daily. She bases extra doses on increased edema or knowing the edema is happening. She reports edema is at its best in the morning and progresses throughout the day. 1+ pitting edema bilateral L>R lower extremities otherwise euvolemic and well compensated on exam. - Continue carvedilol , Hyzaar. - BMP today. Will make adjustments to torsemide  based  on results.  - Instructed to weigh daily.   Permanent A-fib Onset November 2021.  Cardioversion not pursued secondary to severely dilated LAE.  Denies spontaneous bleeding concerns.  Patient denies palpitations or cardiac awareness of arrhythmia. EKG today demonstrates afib 83 bpm.  - Continue Eliquis , carvedilol . Appropriate Eliquis  dose.  Hypertension BP today 134/82. No dizziness or headaches reported.  - Continue carvedilol , Hyzaar.  Hyperlipidemia LDL 30 22 April 2023, at goal. - Continue atorvastatin .  Innominate artery stenosis/carotid artery stenosis S/p carotid to carotid bypass from left to right with ligation of proximal right common carotid February 2021.  Carotid duplex March 2025 showed bilateral ICA 1 to 39% stenosis.  Patient inquires about taking aspirin . Instructed patient to contact vascular about stopping aspirin  given she is on Eliquis .  - Continue to follow with vascular surgery.  Disposition: CBC and BMP. Weight daily. Return in 6 months or sooner as needed.          Signed, Sober HERO. Issiac Jamar, DNP, NP-C

## 2023-12-10 ENCOUNTER — Ambulatory Visit: Payer: Self-pay | Admitting: Emergency Medicine

## 2023-12-10 ENCOUNTER — Ambulatory Visit: Payer: Self-pay | Admitting: Student

## 2023-12-10 ENCOUNTER — Other Ambulatory Visit: Payer: Self-pay

## 2023-12-10 DIAGNOSIS — Z79899 Other long term (current) drug therapy: Secondary | ICD-10-CM

## 2023-12-10 LAB — CBC
Hematocrit: 35.3 % (ref 34.0–46.6)
Hemoglobin: 11.3 g/dL (ref 11.1–15.9)
MCH: 32.2 pg (ref 26.6–33.0)
MCHC: 32 g/dL (ref 31.5–35.7)
MCV: 101 fL — ABNORMAL HIGH (ref 79–97)
Platelets: 127 10*3/uL — ABNORMAL LOW (ref 150–450)
RBC: 3.51 x10E6/uL — ABNORMAL LOW (ref 3.77–5.28)
RDW: 11.9 % (ref 11.7–15.4)
WBC: 3.1 10*3/uL — ABNORMAL LOW (ref 3.4–10.8)

## 2023-12-10 LAB — BASIC METABOLIC PANEL WITH GFR
BUN/Creatinine Ratio: 31 — ABNORMAL HIGH (ref 12–28)
BUN: 43 mg/dL — ABNORMAL HIGH (ref 8–27)
CO2: 27 mmol/L (ref 20–29)
Calcium: 9.4 mg/dL (ref 8.7–10.3)
Chloride: 95 mmol/L — ABNORMAL LOW (ref 96–106)
Creatinine, Ser: 1.4 mg/dL — ABNORMAL HIGH (ref 0.57–1.00)
Glucose: 130 mg/dL — ABNORMAL HIGH (ref 70–99)
Potassium: 4.6 mmol/L (ref 3.5–5.2)
Sodium: 139 mmol/L (ref 134–144)
eGFR: 37 mL/min/{1.73_m2} — ABNORMAL LOW (ref >59)

## 2023-12-16 ENCOUNTER — Ambulatory Visit (INDEPENDENT_AMBULATORY_CARE_PROVIDER_SITE_OTHER): Admitting: Family Medicine

## 2023-12-16 ENCOUNTER — Other Ambulatory Visit: Payer: Self-pay

## 2023-12-16 ENCOUNTER — Encounter: Payer: Self-pay | Admitting: Family Medicine

## 2023-12-16 VITALS — BP 149/78 | HR 83 | Ht 63.0 in | Wt 174.5 lb

## 2023-12-16 DIAGNOSIS — I482 Chronic atrial fibrillation, unspecified: Secondary | ICD-10-CM

## 2023-12-16 DIAGNOSIS — N1831 Chronic kidney disease, stage 3a: Secondary | ICD-10-CM

## 2023-12-16 DIAGNOSIS — E039 Hypothyroidism, unspecified: Secondary | ICD-10-CM

## 2023-12-16 DIAGNOSIS — M064 Inflammatory polyarthropathy: Secondary | ICD-10-CM | POA: Diagnosis not present

## 2023-12-16 DIAGNOSIS — I10 Essential (primary) hypertension: Secondary | ICD-10-CM | POA: Diagnosis not present

## 2023-12-16 DIAGNOSIS — J449 Chronic obstructive pulmonary disease, unspecified: Secondary | ICD-10-CM

## 2023-12-16 MED ORDER — OXYCODONE HCL 5 MG PO TABS
5.0000 mg | ORAL_TABLET | Freq: Two times a day (BID) | ORAL | 0 refills | Status: DC | PRN
  Filled 2023-12-16: qty 45, 23d supply, fill #0

## 2023-12-16 NOTE — Patient Instructions (Addendum)
 It was nice to see you today,  We addressed the following topics today: - I have sent in your oxycodone  so it can be picked up today if the pharmacy fills it before they close - I will fill your medications until I see you again in 6 months  Have a great day,  Rolan Slain, MD

## 2023-12-16 NOTE — Assessment & Plan Note (Signed)
 last labs 5 months ago. - Continue current thyroid  medication - Recheck thyroid  function at next visit

## 2023-12-16 NOTE — Assessment & Plan Note (Addendum)
 Continue carvedilol , losartan -hydrochlorothiazide .  At goal today

## 2023-12-16 NOTE — Progress Notes (Signed)
   Established Patient Office Visit  Subjective   Patient ID: Martha Peterson, female    DOB: 09-08-1940  Age: 83 y.o. MRN: [id number]  Chief Complaint  Patient presents with   Pain Management    HPI  Subjective - Chronic pain management: MS Contin  continues to provide good pain control - Oxycodone  use: Takes 1-1.5 tablets daily as needed, some days may not require any - Recent cardiology follow-up: No changes noted, next appointment in 6 months - Blood pressure monitoring: Occasional home checks, notes elevation during office visits - Medication compliance: Reports good adherence to current regimen  Medications: MS Contin  (chronic pain), oxycodone  1-1.5 tablets daily PRN (pain), Eliquis , baby aspirin , torsemide  20-30mg  daily (adjusts based on swelling, increased in summer), mirtazapine  1.5 tablets nightly, thyroid  medication, carvedilol  twice daily, Lipitor.  PMHx: Chronic pain condition requiring opioid management, cardiac condition requiring anticoagulation, hypertension, hypothyroidism, hyperlipidemia.  ROS: Denies new concerns since last visit. Reports variable swelling managed with diuretic adjustment.   The ASCVD Risk score (Arnett DK, et al., 2019) failed to calculate for the following reasons:   The 2019 ASCVD risk score is only valid for ages 65 to 83  Health Maintenance Due  Topic Date Due   DTaP/Tdap/Td (1 - Tdap) Never done   COVID-19 Vaccine (4 - 2024-25 season) 02/14/2023      Objective:     BP (!) 149/78   Pulse 83   Ht 5' 3 (1.6 m)   Wt 174 lb 8 oz (79.2 kg)   SpO2 98%   BMI 30.91 kg/m    Physical Exam Gen :alert, oriented Pulm: no resp distress Psych: pleasant affect   No results found for any visits on 12/16/23.      Assessment & Plan:   Chronic atrial fibrillation [facility]) Assessment & Plan: - continue eliquis .     Inflammatory polyarthritis (HCC) Assessment & Plan: Chronic pain management with good control on current MS Contin   regimen. Reports stable pain levels with oxycodone  1-1.5 tablets daily as needed. Recent cardiology evaluation showed no changes. - Continue MS Contin  - Continue oxycodone  PRN - Refills provided for 69-month interval  Orders: -     oxyCODONE  HCl; Take 1 tablet (5 mg total) by mouth 2 (two) times daily as needed for severe pain (pain score 7-10).  Dispense: 45 tablet; Refill: 0  Hypothyroidism (acquired) Assessment & Plan:  last labs 5 months ago. - Continue current thyroid  medication - Recheck thyroid  function at next visit   Essential hypertension Assessment & Plan: Continue carvedilol , losartan -hydrochlorothiazide .  At goal today   Chronic obstructive pulmonary disease, unspecified COPD type (HCC) Assessment & Plan: On room air. No significant respiratory distress   Stage 3a chronic kidney disease (HCC) Assessment & Plan: Stable.  Continue to monitor.  Continue hyzaar      Return in about 6 months (around 06/17/2024) for , thyroid , pain.    Toribio MARLA Slain, MD

## 2023-12-16 NOTE — Assessment & Plan Note (Signed)
 Chronic pain management with good control on current MS Contin  regimen. Reports stable pain levels with oxycodone  1-1.5 tablets daily as needed. Recent cardiology evaluation showed no changes. - Continue MS Contin  - Continue oxycodone  PRN - Refills provided for 72-month interval

## 2023-12-16 NOTE — Assessment & Plan Note (Signed)
continue eliquis

## 2023-12-17 DIAGNOSIS — J449 Chronic obstructive pulmonary disease, unspecified: Secondary | ICD-10-CM | POA: Insufficient documentation

## 2023-12-17 NOTE — Assessment & Plan Note (Signed)
 On room air. No significant respiratory distress

## 2023-12-17 NOTE — Assessment & Plan Note (Signed)
 Stable.  Continue to monitor.  Continue hyzaar

## 2023-12-23 ENCOUNTER — Other Ambulatory Visit: Payer: Self-pay | Admitting: Family Medicine

## 2023-12-23 DIAGNOSIS — E039 Hypothyroidism, unspecified: Secondary | ICD-10-CM

## 2023-12-31 ENCOUNTER — Other Ambulatory Visit: Payer: Self-pay

## 2023-12-31 ENCOUNTER — Other Ambulatory Visit: Payer: Self-pay | Admitting: Family Medicine

## 2023-12-31 DIAGNOSIS — M064 Inflammatory polyarthropathy: Secondary | ICD-10-CM

## 2023-12-31 DIAGNOSIS — M150 Primary generalized (osteo)arthritis: Secondary | ICD-10-CM

## 2024-01-01 ENCOUNTER — Other Ambulatory Visit: Payer: Self-pay | Admitting: Family Medicine

## 2024-01-01 DIAGNOSIS — M1612 Unilateral primary osteoarthritis, left hip: Secondary | ICD-10-CM

## 2024-01-01 DIAGNOSIS — M064 Inflammatory polyarthropathy: Secondary | ICD-10-CM

## 2024-01-03 ENCOUNTER — Other Ambulatory Visit: Payer: Self-pay | Admitting: Family Medicine

## 2024-01-03 ENCOUNTER — Other Ambulatory Visit: Payer: Self-pay

## 2024-01-03 DIAGNOSIS — M150 Primary generalized (osteo)arthritis: Secondary | ICD-10-CM

## 2024-01-03 DIAGNOSIS — M064 Inflammatory polyarthropathy: Secondary | ICD-10-CM

## 2024-01-03 MED FILL — Morphine Sulfate Tab ER 30 MG: ORAL | 30 days supply | Qty: 60 | Fill #0 | Status: CN

## 2024-01-03 MED FILL — Morphine Sulfate Tab ER 30 MG: ORAL | 30 days supply | Qty: 60 | Fill #0 | Status: AC

## 2024-01-13 ENCOUNTER — Other Ambulatory Visit: Payer: Self-pay

## 2024-01-13 ENCOUNTER — Other Ambulatory Visit: Payer: Self-pay | Admitting: Family Medicine

## 2024-01-13 DIAGNOSIS — M064 Inflammatory polyarthropathy: Secondary | ICD-10-CM

## 2024-01-13 MED FILL — Oxycodone HCl Tab 5 MG: ORAL | 23 days supply | Qty: 45 | Fill #0 | Status: AC

## 2024-01-14 ENCOUNTER — Other Ambulatory Visit: Payer: Self-pay

## 2024-01-19 ENCOUNTER — Other Ambulatory Visit: Payer: Self-pay | Admitting: Cardiology

## 2024-02-03 ENCOUNTER — Other Ambulatory Visit: Payer: Self-pay | Admitting: Family Medicine

## 2024-02-03 ENCOUNTER — Other Ambulatory Visit: Payer: Self-pay

## 2024-02-03 DIAGNOSIS — M064 Inflammatory polyarthropathy: Secondary | ICD-10-CM

## 2024-02-03 DIAGNOSIS — M150 Primary generalized (osteo)arthritis: Secondary | ICD-10-CM

## 2024-02-03 MED FILL — Morphine Sulfate Tab ER 30 MG: ORAL | 30 days supply | Qty: 60 | Fill #0 | Status: AC

## 2024-02-09 ENCOUNTER — Other Ambulatory Visit: Payer: Self-pay | Admitting: Family Medicine

## 2024-02-09 DIAGNOSIS — I6523 Occlusion and stenosis of bilateral carotid arteries: Secondary | ICD-10-CM

## 2024-02-09 DIAGNOSIS — J309 Allergic rhinitis, unspecified: Secondary | ICD-10-CM

## 2024-02-15 ENCOUNTER — Other Ambulatory Visit: Payer: Self-pay | Admitting: Family Medicine

## 2024-02-15 ENCOUNTER — Other Ambulatory Visit: Payer: Self-pay

## 2024-02-15 DIAGNOSIS — M064 Inflammatory polyarthropathy: Secondary | ICD-10-CM

## 2024-02-16 ENCOUNTER — Other Ambulatory Visit: Payer: Self-pay

## 2024-02-16 MED FILL — Oxycodone HCl Tab 5 MG: ORAL | 23 days supply | Qty: 45 | Fill #0 | Status: AC

## 2024-02-26 ENCOUNTER — Other Ambulatory Visit: Payer: Self-pay | Admitting: Family Medicine

## 2024-02-26 DIAGNOSIS — M791 Myalgia, unspecified site: Secondary | ICD-10-CM

## 2024-03-02 ENCOUNTER — Other Ambulatory Visit: Payer: Self-pay

## 2024-03-02 ENCOUNTER — Other Ambulatory Visit: Payer: Self-pay | Admitting: Family Medicine

## 2024-03-02 DIAGNOSIS — M064 Inflammatory polyarthropathy: Secondary | ICD-10-CM

## 2024-03-02 DIAGNOSIS — M150 Primary generalized (osteo)arthritis: Secondary | ICD-10-CM

## 2024-03-02 MED ORDER — MORPHINE SULFATE ER 30 MG PO TBCR
30.0000 mg | EXTENDED_RELEASE_TABLET | Freq: Two times a day (BID) | ORAL | 0 refills | Status: DC
  Filled 2024-03-02 (×2): qty 60, 30d supply, fill #0

## 2024-03-05 ENCOUNTER — Other Ambulatory Visit: Payer: Self-pay | Admitting: Family Medicine

## 2024-03-05 DIAGNOSIS — F321 Major depressive disorder, single episode, moderate: Secondary | ICD-10-CM

## 2024-03-15 ENCOUNTER — Other Ambulatory Visit: Payer: Self-pay

## 2024-03-15 ENCOUNTER — Other Ambulatory Visit: Payer: Self-pay | Admitting: Family Medicine

## 2024-03-15 DIAGNOSIS — M064 Inflammatory polyarthropathy: Secondary | ICD-10-CM

## 2024-03-15 MED ORDER — OXYCODONE HCL 5 MG PO TABS
5.0000 mg | ORAL_TABLET | Freq: Two times a day (BID) | ORAL | 0 refills | Status: DC | PRN
  Filled 2024-03-15: qty 45, 23d supply, fill #0

## 2024-03-31 ENCOUNTER — Other Ambulatory Visit: Payer: Self-pay

## 2024-03-31 ENCOUNTER — Other Ambulatory Visit: Payer: Self-pay | Admitting: Family Medicine

## 2024-03-31 DIAGNOSIS — M150 Primary generalized (osteo)arthritis: Secondary | ICD-10-CM

## 2024-03-31 DIAGNOSIS — M064 Inflammatory polyarthropathy: Secondary | ICD-10-CM

## 2024-03-31 MED ORDER — MORPHINE SULFATE ER 30 MG PO TBCR
30.0000 mg | EXTENDED_RELEASE_TABLET | Freq: Two times a day (BID) | ORAL | 0 refills | Status: DC
  Filled 2024-03-31: qty 60, 30d supply, fill #0

## 2024-04-14 ENCOUNTER — Other Ambulatory Visit: Payer: Self-pay | Admitting: Family Medicine

## 2024-04-14 ENCOUNTER — Other Ambulatory Visit: Payer: Self-pay

## 2024-04-14 DIAGNOSIS — M064 Inflammatory polyarthropathy: Secondary | ICD-10-CM

## 2024-04-14 DIAGNOSIS — M150 Primary generalized (osteo)arthritis: Secondary | ICD-10-CM

## 2024-04-17 ENCOUNTER — Other Ambulatory Visit: Payer: Self-pay | Admitting: Family Medicine

## 2024-04-17 ENCOUNTER — Other Ambulatory Visit: Payer: Self-pay

## 2024-04-17 DIAGNOSIS — M064 Inflammatory polyarthropathy: Secondary | ICD-10-CM

## 2024-04-17 DIAGNOSIS — M150 Primary generalized (osteo)arthritis: Secondary | ICD-10-CM

## 2024-04-18 ENCOUNTER — Other Ambulatory Visit: Payer: Self-pay

## 2024-04-18 ENCOUNTER — Other Ambulatory Visit: Payer: Self-pay | Admitting: Family Medicine

## 2024-04-18 DIAGNOSIS — M064 Inflammatory polyarthropathy: Secondary | ICD-10-CM

## 2024-04-18 MED ORDER — OXYCODONE HCL 5 MG PO TABS
5.0000 mg | ORAL_TABLET | Freq: Two times a day (BID) | ORAL | 0 refills | Status: DC | PRN
  Filled 2024-04-18: qty 45, 23d supply, fill #0

## 2024-04-19 ENCOUNTER — Other Ambulatory Visit: Payer: Self-pay

## 2024-05-02 ENCOUNTER — Other Ambulatory Visit: Payer: Self-pay | Admitting: Family Medicine

## 2024-05-02 ENCOUNTER — Other Ambulatory Visit: Payer: Self-pay

## 2024-05-02 DIAGNOSIS — M064 Inflammatory polyarthropathy: Secondary | ICD-10-CM

## 2024-05-02 DIAGNOSIS — M150 Primary generalized (osteo)arthritis: Secondary | ICD-10-CM

## 2024-05-02 MED FILL — Morphine Sulfate Tab ER 30 MG: ORAL | 30 days supply | Qty: 60 | Fill #0 | Status: AC

## 2024-05-09 ENCOUNTER — Other Ambulatory Visit: Payer: Self-pay | Admitting: Cardiology

## 2024-05-09 ENCOUNTER — Other Ambulatory Visit: Payer: Self-pay | Admitting: Family Medicine

## 2024-05-09 DIAGNOSIS — I4821 Permanent atrial fibrillation: Secondary | ICD-10-CM

## 2024-05-09 NOTE — Telephone Encounter (Signed)
 Prescription refill request for Eliquis  received. Indication:afib Last office visit:6/25 Scr: 1.40  6/25 Age:83 Weight:79.2  kg  Prescription refilled

## 2024-05-11 ENCOUNTER — Other Ambulatory Visit: Payer: Self-pay | Admitting: Family Medicine

## 2024-05-11 DIAGNOSIS — I10 Essential (primary) hypertension: Secondary | ICD-10-CM

## 2024-05-16 ENCOUNTER — Other Ambulatory Visit: Payer: Self-pay | Admitting: Family Medicine

## 2024-05-16 ENCOUNTER — Telehealth: Payer: Self-pay

## 2024-05-16 ENCOUNTER — Other Ambulatory Visit: Payer: Self-pay

## 2024-05-16 DIAGNOSIS — M85859 Other specified disorders of bone density and structure, unspecified thigh: Secondary | ICD-10-CM

## 2024-05-16 DIAGNOSIS — M064 Inflammatory polyarthropathy: Secondary | ICD-10-CM

## 2024-05-16 MED ORDER — OXYCODONE HCL 5 MG PO TABS
5.0000 mg | ORAL_TABLET | Freq: Two times a day (BID) | ORAL | 0 refills | Status: DC | PRN
  Filled 2024-05-16 (×2): qty 45, 23d supply, fill #0

## 2024-05-16 NOTE — Telephone Encounter (Signed)
 Copied from CRM #[id number]. Topic: Clinical - Request for Lab/Test Order >> May 16, 2024  1:10 PM Thliyah D wrote: Pt is needing a bone density test

## 2024-05-31 ENCOUNTER — Other Ambulatory Visit: Payer: Self-pay

## 2024-05-31 ENCOUNTER — Other Ambulatory Visit: Payer: Self-pay | Admitting: Family Medicine

## 2024-05-31 DIAGNOSIS — M064 Inflammatory polyarthropathy: Secondary | ICD-10-CM

## 2024-05-31 DIAGNOSIS — M150 Primary generalized (osteo)arthritis: Secondary | ICD-10-CM

## 2024-05-31 MED ORDER — MORPHINE SULFATE ER 30 MG PO TBCR
30.0000 mg | EXTENDED_RELEASE_TABLET | Freq: Two times a day (BID) | ORAL | 0 refills | Status: AC
  Filled 2024-05-31: qty 60, 30d supply, fill #0

## 2024-06-05 ENCOUNTER — Other Ambulatory Visit: Payer: Self-pay

## 2024-06-05 DIAGNOSIS — E039 Hypothyroidism, unspecified: Secondary | ICD-10-CM

## 2024-06-06 NOTE — Progress Notes (Signed)
 "  Cardiology Clinic Note   Date: 06/13/2024 ID: Imoni, Kohen 08/07/1940, MRN [id number]  Primary Cardiologist:  Redell Cave, MD  Chief Complaint   Martha Peterson is a 83 y.o. female who presents to the clinic today for routine follow up.   Patient Profile   Martha Peterson is followed by Dr. Cave for the history outlined below.       Past medical history significant for: Chronic HFrEF with improved LV function/Pulmonary hypertension. Nuclear stress test 07/10/2019: Normal, low risk study.  Downsloping ST segment depressions noted during stress in II, III and aVF leads not diagnostic given abnormal baseline.  No T wave inversion during stress. Echo 10/05/2019: EF 60 to 65%.  No RWMA.  Grade II DD.  Normal RV size/function.  Moderately elevated PA pressure, RVSP 42.7 mmHg.  Severe LAE.  Mild RAE.  Mild MR.  Mild to moderate aortic valve sclerosis/calcification without stenosis.  (Compared to echo done at outside facility in [place], [place] EF 45%, mild MR) December 2020. Permanent A-fib. Onset November 2021. 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 Carotid artery stenosis. Carotid duplex 08/27/2023: Bilateral ICA 1 to 39%. Hypertension. Hyperlipidemia. Lipid panel 05/10/2023: LDL 37, HDL 53, TG 78, total 106. CKD stage III. TIA.  In summary, patient was first evaluated by Dr. Cave on 06/30/2019 for abnormal echo.  Patient reported history of MR followed with periodic echoes.  Echo obtained in December 2020 showed mildly reduced EF of 45%, Grade I DD, moderate concentric LVH, trivial MR, aortic sclerosis without stenosis.  She reported history of carotid stenosis with with CTa of the neck showing high-grade calcific stenosis innominate artery, flow-limiting stenosis in the right carotid system, sclerosis with calcification at the carotid bifurcation bilaterally  without significant stenosis.  She was pending vascular surgery visit.  She ultimately underwent carotid to carotid bypass in February 2021.  Patient was seen in the office in November 2021 for routine follow-up.  EKG showed new onset A-fib heart rate 110 bpm.  She was started on Eliquis  for stroke prophylaxis.  Given her severely dilated left atrium it was felt her chance of maintaining sinus rhythm with cardioversion was significantly reduced. She was seen for routine follow up in May 2024 and was doing well. No medication changes were made.   Patient was last seen in the office by me on 12/09/2023 for routine follow up. She had no new cardiac complaints. She had labs drawn which showed elevated creatinine. Patient was instructed to weigh daily and reduce torsemide  to 20 mg daily with extra 20 as needed for weight gain and increased edema. She was instructed to repeat BMP but it does not appear to have been completed.      History of Present Illness    Today, patient is accompanied by her daughter, Martha Peterson. She reports she is doing well since her last visit. Chronic lower extremity edema L>R has been stable since decreasing torsemide . She does not weigh at home, as her scale is broken. Her daughter states she keeps a close watch on her edema. Patient denies shortness of breath, orthopnea or PND. No chest pain, pressure or tightness. She typically does not have palpitations or cardiac awareness of afib. However, if she consumes caffeine a few days in a row she will feel unwell. She typically drinks decaffeinated tea and coffee but every once in a while she will have caffeine. She lives  alone and gets a lot of help from her daughter. She is independent with ADLs and is able to perform cooking and light to moderate household activities.     ROS: All other systems reviewed and are otherwise negative except as noted in History of Present Illness.  EKGs/Labs Reviewed    EKG Interpretation Date/Time:  Tuesday  June 13 2024 14:35:19 EST Ventricular Rate:  90 PR Interval:    QRS Duration:  92 QT Interval:  380 QTC Calculation: 464 R Axis:   35  Text Interpretation: Atrial fibrillation ST & T wave abnormality, consider inferior ischemia When compared with ECG of 09-Dec-2023 14:14, No significant change was found Confirmed by Loistine Sober [phone]) on 06/13/2024 2:45:08 PM   12/09/2023: BUN 43; Creatinine, Ser 1.40; Potassium 4.6; Sodium 139   12/09/2023: Hemoglobin 11.3; WBC 3.1   07/20/2023: TSH 3.110    Risk Assessment/Calculations     CHA2DS2-VASc Score = 7   This indicates a 11.2% annual risk of stroke. The patient's score is based upon: CHF History: 1 HTN History: 1 Diabetes History: 0 Stroke History: 2 Vascular Disease History: 0 Age Score: 2 Gender Score: 1             Physical Exam    VS:  BP 120/84 (BP Location: Left Arm, Patient Position: Sitting, Cuff Size: Large)   Pulse 89   Ht 5' 3 (1.6 m)   Wt 170 lb (77.1 kg)   SpO2 97%   BMI 30.11 kg/m  , BMI Body mass index is 30.11 kg/m.  GEN: Well nourished, well developed, in no acute distress. Neck: No JVD or carotid bruits. Cardiac: Irregularly irregular rhythm. 2/6 systolic murmur. No rubs or gallops.   Respiratory:  Respirations regular and unlabored. Clear to auscultation without rales, wheezing or rhonchi. GI: Soft, nontender, nondistended. Extremities: Radials/DP/PT 2+ and equal bilaterally. No clubbing or cyanosis. 1+ pitting edema L>R lower extremities.   Skin: Warm and dry, no rash. Neuro: Strength intact.  Assessment & Plan   Chronic HFmrEF with improved LV function/pulmonary hypertension Echo April 2021 showed EF 60 to 65%, no RWMA, grade 2 DD, normal RV size/function, moderately elevated PA pressure, severe LAE, mild RAE, mild MR, mild to moderate aortic valve sclerosis without stenosis.  Patient reports lower extremity edema is stable since decreasing dose of torsemide . She does tend to have  increased edema during the warmer months. She denies shortness of breath, orthopnea or PND. She is not weighing at home due to her scale being broken. Her daughter keeps a close watch on edema. She has 1+ pitting edema L>R otherwise euvolemic and well compensated on exam.  - Continue carvedilol , Hyzaar. - CMP today.   Permanent A-fib Onset November 2021.  Cardioversion not pursued secondary to severely dilated LAE.  Denies spontaneous bleeding concerns.  Patient denies palpitations or cardiac awareness of arrhythmia. If she drinks caffeine a few days in a row (she normally sticks to decaffeinated drinks) she will feel unwell.  EKG today demonstrates afib 90 bpm.  - Continue Eliquis , carvedilol . Appropriate Eliquis  dose. - CBC today.    Hypertension BP today 120/84. No dizziness or headaches reported.  - Continue carvedilol , Hyzaar.   Hyperlipidemia LDL 37 November 2024, at goal. - Continue atorvastatin .   Innominate artery stenosis/carotid artery stenosis S/p carotid to carotid bypass from left to right with ligation of proximal right common carotid February 2021.  Carotid duplex March 2025 showed bilateral ICA 1 to 39% stenosis.  Patient reports she stopped  aspirin  some time ago when she ran out and just never bought more. Recommended checking in with vascular surgery to ensure they are aware.  - Continue to follow with vascular surgery.  Disposition: CBC, CMP, TSH, free T4 today. Return in 6 months or sooner as needed.          Signed, Barnie HERO. Tanara Turvey, DNP, NP-C  "

## 2024-06-13 ENCOUNTER — Ambulatory Visit: Attending: Student | Admitting: Student

## 2024-06-13 ENCOUNTER — Encounter: Payer: Self-pay | Admitting: Student

## 2024-06-13 VITALS — BP 120/84 | HR 89 | Ht 63.0 in | Wt 170.0 lb

## 2024-06-13 DIAGNOSIS — I5020 Unspecified systolic (congestive) heart failure: Secondary | ICD-10-CM | POA: Diagnosis not present

## 2024-06-13 DIAGNOSIS — I10 Essential (primary) hypertension: Secondary | ICD-10-CM

## 2024-06-13 DIAGNOSIS — Z79899 Other long term (current) drug therapy: Secondary | ICD-10-CM | POA: Diagnosis not present

## 2024-06-13 DIAGNOSIS — I4821 Permanent atrial fibrillation: Secondary | ICD-10-CM | POA: Diagnosis not present

## 2024-06-13 DIAGNOSIS — I6523 Occlusion and stenosis of bilateral carotid arteries: Secondary | ICD-10-CM

## 2024-06-13 DIAGNOSIS — E785 Hyperlipidemia, unspecified: Secondary | ICD-10-CM | POA: Diagnosis not present

## 2024-06-13 DIAGNOSIS — I771 Stricture of artery: Secondary | ICD-10-CM

## 2024-06-13 NOTE — Patient Instructions (Signed)
 Medication Instructions:   Your physician recommends that you continue on your current medications as directed. Please refer to the Current Medication list given to you today.    *If you need a refill on your cardiac medications before your next appointment, please call your pharmacy*  Lab Work:  Your provider would like for you to have following labs drawn today CBC, CMet, TSH, T4-Free.    If you have labs (blood work) drawn today and your tests are completely normal, you will receive your results only by:  MyChart Message (if you have MyChart) OR  A paper copy in the mail If you have any lab test that is abnormal or we need to change your treatment, we will call you to review the results.  Testing/Procedures:  None ordered at this time   Referrals:  None ordered at this time   Follow-Up:  At [facility], you and your health needs are our priority.  As part of our continuing mission to provide you with exceptional heart care, our providers are all part of one team.  This team includes your primary Cardiologist (physician) and Advanced Practice Providers or APPs (Physician Assistants and Nurse Practitioners) who all work together to provide you with the care you need, when you need it.  Your next appointment:   5 - 6 month(s)  Provider:    Redell Cave, MD or Barnie Hila, NP    We recommend signing up for the patient portal called MyChart.  Sign up information is provided on this After Visit Summary.  MyChart is used to connect with patients for Virtual Visits (Telemedicine).  Patients are able to view lab/test results, encounter notes, upcoming appointments, etc.  Non-urgent messages can be sent to your provider as well.   To learn more about what you can do with MyChart, go to [email or web address].

## 2024-06-14 ENCOUNTER — Ambulatory Visit: Payer: Self-pay | Admitting: Student

## 2024-06-14 LAB — CBC
Hematocrit: 36.8 % (ref 34.0–46.6)
Hemoglobin: 11.7 g/dL (ref 11.1–15.9)
MCH: 32.1 pg (ref 26.6–33.0)
MCHC: 31.8 g/dL (ref 31.5–35.7)
MCV: 101 fL — ABNORMAL HIGH (ref 79–97)
Platelets: 130 x10E3/uL — ABNORMAL LOW (ref 150–450)
RBC: 3.64 x10E6/uL — ABNORMAL LOW (ref 3.77–5.28)
RDW: 12.2 % (ref 11.7–15.4)
WBC: 4.1 x10E3/uL (ref 3.4–10.8)

## 2024-06-14 LAB — COMPREHENSIVE METABOLIC PANEL WITH GFR
ALT: 23 IU/L (ref 0–32)
AST: 42 IU/L — ABNORMAL HIGH (ref 0–40)
Albumin: 4.6 g/dL (ref 3.7–4.7)
Alkaline Phosphatase: 124 IU/L (ref 48–129)
BUN/Creatinine Ratio: 26 (ref 12–28)
BUN: 37 mg/dL — ABNORMAL HIGH (ref 8–27)
Bilirubin Total: 0.8 mg/dL (ref 0.0–1.2)
CO2: 30 mmol/L — ABNORMAL HIGH (ref 20–29)
Calcium: 10.6 mg/dL — ABNORMAL HIGH (ref 8.7–10.3)
Chloride: 94 mmol/L — ABNORMAL LOW (ref 96–106)
Creatinine, Ser: 1.43 mg/dL — ABNORMAL HIGH (ref 0.57–1.00)
Globulin, Total: 3.2 g/dL (ref 1.5–4.5)
Glucose: 148 mg/dL — ABNORMAL HIGH (ref 70–99)
Potassium: 5 mmol/L (ref 3.5–5.2)
Sodium: 137 mmol/L (ref 134–144)
Total Protein: 7.8 g/dL (ref 6.0–8.5)
eGFR: 36 mL/min/1.73 — ABNORMAL LOW

## 2024-06-14 LAB — T4, FREE: Free T4: 1.55 ng/dL (ref 0.82–1.77)

## 2024-06-14 LAB — TSH: TSH: 7 u[IU]/mL — ABNORMAL HIGH (ref 0.450–4.500)

## 2024-06-16 ENCOUNTER — Other Ambulatory Visit: Payer: Self-pay | Admitting: Family Medicine

## 2024-06-16 ENCOUNTER — Other Ambulatory Visit: Payer: Self-pay

## 2024-06-16 DIAGNOSIS — M064 Inflammatory polyarthropathy: Secondary | ICD-10-CM

## 2024-06-18 ENCOUNTER — Other Ambulatory Visit (HOSPITAL_COMMUNITY): Payer: Self-pay

## 2024-06-18 ENCOUNTER — Other Ambulatory Visit: Payer: Self-pay | Admitting: Student

## 2024-06-19 ENCOUNTER — Other Ambulatory Visit: Payer: Self-pay

## 2024-06-19 ENCOUNTER — Ambulatory Visit (INDEPENDENT_AMBULATORY_CARE_PROVIDER_SITE_OTHER): Admitting: Family Medicine

## 2024-06-19 ENCOUNTER — Encounter: Payer: Self-pay | Admitting: Family Medicine

## 2024-06-19 ENCOUNTER — Telehealth: Payer: Self-pay

## 2024-06-19 VITALS — BP 121/72 | HR 93 | Ht 63.0 in | Wt 169.0 lb

## 2024-06-19 DIAGNOSIS — G894 Chronic pain syndrome: Secondary | ICD-10-CM

## 2024-06-19 DIAGNOSIS — M150 Primary generalized (osteo)arthritis: Secondary | ICD-10-CM | POA: Diagnosis not present

## 2024-06-19 DIAGNOSIS — L97411 Non-pressure chronic ulcer of right heel and midfoot limited to breakdown of skin: Secondary | ICD-10-CM

## 2024-06-19 DIAGNOSIS — E039 Hypothyroidism, unspecified: Secondary | ICD-10-CM

## 2024-06-19 DIAGNOSIS — M064 Inflammatory polyarthropathy: Secondary | ICD-10-CM | POA: Diagnosis not present

## 2024-06-19 DIAGNOSIS — N1831 Chronic kidney disease, stage 3a: Secondary | ICD-10-CM

## 2024-06-19 MED ORDER — OXYCODONE HCL 5 MG PO TABS: 7.5000 mg | ORAL_TABLET | Freq: Every day | ORAL | 0 refills | Status: DC | PRN

## 2024-06-19 MED ORDER — MORPHINE SULFATE ER 30 MG PO TBCR: 30.0000 mg | EXTENDED_RELEASE_TABLET | Freq: Two times a day (BID) | ORAL | 0 refills | Status: DC

## 2024-06-19 NOTE — Telephone Encounter (Signed)
 ERROR

## 2024-06-19 NOTE — Progress Notes (Unsigned)
 "  Established Patient Office Visit  Subjective   Patient ID: Martha Peterson, female    DOB: 1940/12/23  Age: 84 y.o. MRN: [id number]  Chief Complaint  Patient presents with   Medical Management of Chronic Issues    Discussed the use of AI scribe software for clinical note transcription with the patient, who gave verbal consent to proceed.  History of Present Illness   Martha Peterson is an 84 year old female who presents with a sore on her right heel.  She has a sore on her right heel that began approximately six weeks ago. The sore is nickel-sized with a white center and beefy red skin around the edges. Initially, she managed it by applying a Mibelas dressing for three to four days and cleaning it with peroxide.  Her current medications include Lipitor, carvedilol , Eliquis , Synthroid , Hyzaar, and Remeron . She takes 137 mcg of Synthroid , which was previously 150 mcg and even 200 mcg years ago. Remeron  is taken at night for sleep. She also uses oxycodone  and MS Contin , which she picks up from the pharmacy every two weeks.  She recently had a TSH and T4 test done, ordered by her cardiologist's office. Her T4 levels are normal. No increased sluggishness or fatigue.  She does not see a nephrologist. No new symptoms or changes in her condition are reported.          The ASCVD Risk score (Arnett DK, et al., 2019) failed to calculate for the following reasons:   The 2019 ASCVD risk score is only valid for ages 57 to 15   * - Cholesterol units were assumed  Health Maintenance Due  Topic Date Due   DTaP/Tdap/Td (1 - Tdap) Never done   COVID-19 Vaccine (4 - 2025-26 season) 02/14/2024   Medicare Annual Wellness (AWV)  05/10/2024      Objective:     BP 121/72   Pulse 93   Ht 5' 3 (1.6 m)   Wt 169 lb (76.7 kg)   SpO2 97%   BMI 29.94 kg/m  {Vitals History (Optional):23777}  Physical Exam   EXTREMITIES: Right heel with a nickel-sized ulcer, white center, beefy red edges.         No results found for any visits on 06/19/24.      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 - Apply Vaseline as a barrier. - Consider special foot pads for cushioning. - Avoid Neosporin to prevent irritation. - Monitor for infection signs. - Consider podiatrist referral. - Wear soft, non-pinching footwear. - Elevate heel with a pillow.   Inflammatory polyarthritis (HCC) -     oxyCODONE  HCl; Take 1.5 tablets (7.5 mg total) by mouth daily as needed for severe pain (pain score 7-10).  Dispense: 15 tablet; Refill: 0 -     oxyCODONE  HCl; Take 1.5 tablets (7.5 mg total) by mouth daily as needed for severe pain (pain score 7-10).  Dispense: 45 tablet; Refill: 0 -     oxyCODONE  HCl; Take 1.5 tablets (7.5 mg total) by mouth daily as needed for severe pain (pain score 7-10).  Dispense: 45 tablet; Refill: 0 -     Morphine  Sulfate ER; Take 1 tablet (30 mg total) by mouth every 12 (twelve) hours.  Dispense: 60 tablet; Refill: 0 -     Morphine  Sulfate ER; Take 1 tablet (30 mg total) by mouth every 12 (twelve)  hours.  Dispense: 60 tablet; Refill: 0  Primary osteoarthritis involving multiple joints -     Morphine  Sulfate ER; Take 1 tablet (30 mg total) by mouth every 12 (twelve) hours.  Dispense: 60 tablet; Refill: 0 -     Morphine  Sulfate ER; Take 1 tablet (30 mg total) by mouth every 12 (twelve) hours.  Dispense: 60 tablet; Refill: 0  Inflammatory polyarthropathy (HCC)  Primary generalized (osteo)arthritis  Hypothyroidism (acquired) Assessment & Plan: TSH slightly elevated, T4 normal, no symptoms. Current TSH level not harmful. - Repeat TSH in one month. - Continue Synthroid  137 mcg.   Stage 3a chronic kidney disease (HCC) Assessment & Plan: Kidney function well-managed, calcium  slightly elevated, no nephrology follow-up. - Ordered urine test for proteinuria. -  Consider nephrology referral if indicated.   Chronic pain syndrome Assessment & Plan: Pain managed with oxycodone  and MS Contin . - Adjusted prescription schedule for simultaneous pickup. - Wrote three prescriptions for next three months.          Return in about 6 months (around 12/17/2024) for tsh, hld, htn.    Toribio MARLA Slain, MD  "

## 2024-06-19 NOTE — Patient Instructions (Addendum)
" °  VISIT SUMMARY: Today, we addressed the sore on your right heel, your thyroid  levels, kidney function, and chronic pain management.  YOUR PLAN: CHRONIC ULCER OF RIGHT HEEL: You have a sore on your right heel that has been healing and shows no signs of infection. -Apply Vaseline to the sore as a barrier. -Consider using special foot pads for extra cushioning. -Avoid using Neosporin to prevent irritation. -Monitor the sore for any signs of infection. -Consider seeing a podiatrist for further care. -Wear soft, non-pinching footwear. -Elevate your heel with a pillow when resting.  HYPOTHYROIDISM AFTER THYROIDECTOMY: Your TSH levels are slightly elevated, but your T4 levels are normal and you have no symptoms. -Repeat the TSH test in one month. -Continue taking Synthroid  at 137 mcg daily.  CHRONIC KIDNEY DISEASE: Your kidney function is well-managed, but your calcium  levels are slightly elevated. -A urine test has been ordered to check for proteinuria. -Consider seeing a nephrologist if needed.  CHRONIC PAIN: Your pain is managed with oxycodone  and MS Contin . -Your prescription schedule has been adjusted for simultaneous pickup. -You have been given three prescriptions to cover the next three months.   "

## 2024-06-19 NOTE — Telephone Encounter (Signed)
 Already faxed 06/19/2024

## 2024-06-20 ENCOUNTER — Telehealth: Payer: Self-pay

## 2024-06-20 ENCOUNTER — Other Ambulatory Visit: Payer: Self-pay

## 2024-06-20 DIAGNOSIS — L97411 Non-pressure chronic ulcer of right heel and midfoot limited to breakdown of skin: Secondary | ICD-10-CM | POA: Insufficient documentation

## 2024-06-20 MED ORDER — OXYCODONE HCL 5 MG PO TABS
7.5000 mg | ORAL_TABLET | Freq: Every day | ORAL | 0 refills | Status: AC | PRN
  Filled 2024-06-20: qty 15, 10d supply, fill #0

## 2024-06-20 MED ORDER — MORPHINE SULFATE ER 30 MG PO TBCR
30.0000 mg | EXTENDED_RELEASE_TABLET | Freq: Two times a day (BID) | ORAL | 0 refills | Status: AC
  Filled 2024-06-30: qty 60, 30d supply, fill #0

## 2024-06-20 MED ORDER — OXYCODONE HCL 5 MG PO TABS
7.5000 mg | ORAL_TABLET | Freq: Every day | ORAL | 0 refills | Status: AC | PRN
  Filled 2024-06-30: qty 45, 30d supply, fill #0

## 2024-06-20 MED ORDER — OXYCODONE HCL 5 MG PO TABS: 7.5000 mg | ORAL_TABLET | Freq: Every day | ORAL | 0 refills | Status: AC | PRN

## 2024-06-20 MED ORDER — MORPHINE SULFATE ER 30 MG PO TBCR: 30.0000 mg | EXTENDED_RELEASE_TABLET | Freq: Two times a day (BID) | ORAL | 0 refills | Status: AC

## 2024-06-20 NOTE — Assessment & Plan Note (Signed)
 Kidney function well-managed, calcium  slightly elevated, no nephrology follow-up. - Ordered urine test for proteinuria. - Consider nephrology referral if indicated.

## 2024-06-20 NOTE — Telephone Encounter (Signed)
 Copied from CRM [id number]. Topic: Clinical - Prescription Issue >> Jun 20, 2024  2:34 PM Sophia H wrote: Reason for CRM: Patients daughter states patients pain meds were sent to incorrect pharmacy. Need to be sent to :   [facility] [address] [place] [address] Phone: [phone] Fax: [phone]   oxyCODONE  (OXY IR/ROXICODONE ) 5 MG immediate release tablet

## 2024-06-20 NOTE — Assessment & Plan Note (Signed)
 TSH slightly elevated, T4 normal, no symptoms. Current TSH level not harmful. - Repeat TSH in one month. - Continue Synthroid  137 mcg.

## 2024-06-20 NOTE — Telephone Encounter (Signed)
 I have resent them.  Can you call [facility] to cancel the other ones?

## 2024-06-20 NOTE — Telephone Encounter (Signed)
 I called [facility] and cancelled this order and she removed it from the patients profile since you had sent future to the local pharmacy.

## 2024-06-20 NOTE — Assessment & Plan Note (Signed)
 Chronic ulcer healing with no infection signs. - Apply Vaseline as a barrier. - Consider special foot pads for cushioning. - Avoid Neosporin to prevent irritation. - Monitor for infection signs. - Consider podiatrist referral. - Wear soft, non-pinching footwear. - Elevate heel with a pillow.

## 2024-06-20 NOTE — Assessment & Plan Note (Signed)
 Pain managed with oxycodone  and MS Contin . - Adjusted prescription schedule for simultaneous pickup. - Wrote three prescriptions for next three months.

## 2024-06-21 ENCOUNTER — Other Ambulatory Visit: Payer: Self-pay

## 2024-06-21 NOTE — Progress Notes (Signed)
 Last read by Tilton SHAUNNA Ling at 8:24PM on 06/16/2024.

## 2024-06-21 NOTE — Assessment & Plan Note (Signed)
 Diagnosed several years ago.  Stable on ms contin  and oxycodone  which she has been on for several years without any issues

## 2024-06-30 ENCOUNTER — Other Ambulatory Visit: Payer: Self-pay

## 2024-08-29 ENCOUNTER — Encounter (INDEPENDENT_AMBULATORY_CARE_PROVIDER_SITE_OTHER)

## 2024-08-29 ENCOUNTER — Ambulatory Visit (INDEPENDENT_AMBULATORY_CARE_PROVIDER_SITE_OTHER): Admitting: Vascular Surgery

## 2024-12-11 ENCOUNTER — Ambulatory Visit: Admitting: Student

## 2024-12-20 ENCOUNTER — Ambulatory Visit: Admitting: Family Medicine
